# Patient Record
Sex: Female | Born: 1935 | Race: White | Hispanic: No | Marital: Married | Smoking: Never smoker
Health system: Southern US, Community
[De-identification: ages and names within clinical notes are randomized; demographics above are authoritative.]

## PROBLEM LIST (undated history)

## (undated) DIAGNOSIS — S7290XA Unspecified fracture of unspecified femur, initial encounter for closed fracture: Secondary | ICD-10-CM

## (undated) DIAGNOSIS — I679 Cerebrovascular disease, unspecified: Secondary | ICD-10-CM

## (undated) DIAGNOSIS — M8080XA Other osteoporosis with current pathological fracture, unspecified site, initial encounter for fracture: Secondary | ICD-10-CM

## (undated) DIAGNOSIS — R609 Edema, unspecified: Secondary | ICD-10-CM

## (undated) DIAGNOSIS — G319 Degenerative disease of nervous system, unspecified: Secondary | ICD-10-CM

## (undated) DIAGNOSIS — I639 Cerebral infarction, unspecified: Secondary | ICD-10-CM

## (undated) DIAGNOSIS — I4891 Unspecified atrial fibrillation: Secondary | ICD-10-CM

## (undated) DIAGNOSIS — K219 Gastro-esophageal reflux disease without esophagitis: Secondary | ICD-10-CM

## (undated) DIAGNOSIS — D500 Iron deficiency anemia secondary to blood loss (chronic): Secondary | ICD-10-CM

## (undated) DIAGNOSIS — E785 Hyperlipidemia, unspecified: Secondary | ICD-10-CM

## (undated) DIAGNOSIS — E119 Type 2 diabetes mellitus without complications: Secondary | ICD-10-CM

## (undated) DIAGNOSIS — Z794 Long term (current) use of insulin: Secondary | ICD-10-CM

## (undated) DIAGNOSIS — I071 Rheumatic tricuspid insufficiency: Secondary | ICD-10-CM

## (undated) DIAGNOSIS — I5032 Chronic diastolic (congestive) heart failure: Secondary | ICD-10-CM

## (undated) DIAGNOSIS — E041 Nontoxic single thyroid nodule: Secondary | ICD-10-CM

## (undated) DIAGNOSIS — G459 Transient cerebral ischemic attack, unspecified: Secondary | ICD-10-CM

## (undated) DIAGNOSIS — I10 Essential (primary) hypertension: Secondary | ICD-10-CM

## (undated) DIAGNOSIS — M1990 Unspecified osteoarthritis, unspecified site: Secondary | ICD-10-CM

## (undated) DIAGNOSIS — Z923 Personal history of irradiation: Secondary | ICD-10-CM

## (undated) DIAGNOSIS — M4850XA Collapsed vertebra, not elsewhere classified, site unspecified, initial encounter for fracture: Secondary | ICD-10-CM

## (undated) DIAGNOSIS — C3490 Malignant neoplasm of unspecified part of unspecified bronchus or lung: Secondary | ICD-10-CM

## (undated) DIAGNOSIS — I272 Pulmonary hypertension, unspecified: Secondary | ICD-10-CM

## (undated) HISTORY — DX: Iron deficiency anemia secondary to blood loss (chronic): D50.0

## (undated) HISTORY — DX: Transient cerebral ischemic attack, unspecified: G45.9

## (undated) HISTORY — DX: Other osteoporosis with current pathological fracture, unspecified site, initial encounter for fracture: M80.80XA

## (undated) HISTORY — DX: Malignant neoplasm of unspecified part of unspecified bronchus or lung: C34.90

## (undated) HISTORY — DX: Cerebral infarction, unspecified: I63.9

## (undated) HISTORY — DX: Unspecified fracture of unspecified femur, initial encounter for closed fracture: S72.90XA

## (undated) HISTORY — DX: Type 2 diabetes mellitus without complications: E11.9

## (undated) HISTORY — DX: Personal history of irradiation: Z92.3

## (undated) HISTORY — DX: Edema, unspecified: R60.9

## (undated) HISTORY — DX: Unspecified osteoarthritis, unspecified site: M19.90

## (undated) HISTORY — DX: Hyperlipidemia, unspecified: E78.5

## (undated) HISTORY — DX: Nontoxic single thyroid nodule: E04.1

## (undated) HISTORY — DX: Unspecified atrial fibrillation: I48.91

## (undated) HISTORY — DX: Essential (primary) hypertension: I10

## (undated) HISTORY — DX: Gastro-esophageal reflux disease without esophagitis: K21.9

## (undated) HISTORY — DX: Type 2 diabetes mellitus without complications: Z79.4

## (undated) HISTORY — DX: Chronic diastolic (congestive) heart failure: I50.32

---
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
## 1998-01-14 ENCOUNTER — Other Ambulatory Visit: Admission: RE | Admit: 1998-01-14 | Discharge: 1998-01-14 | Payer: Self-pay | Admitting: Gynecology

## 1999-01-23 ENCOUNTER — Other Ambulatory Visit: Admission: RE | Admit: 1999-01-23 | Discharge: 1999-01-23 | Payer: Self-pay | Admitting: Gynecology

## 1999-06-10 ENCOUNTER — Ambulatory Visit (HOSPITAL_COMMUNITY): Admission: RE | Admit: 1999-06-10 | Discharge: 1999-06-10 | Payer: Self-pay | Admitting: *Deleted

## 2000-02-27 ENCOUNTER — Other Ambulatory Visit: Admission: RE | Admit: 2000-02-27 | Discharge: 2000-02-27 | Payer: Self-pay | Admitting: Gynecology

## 2001-02-21 ENCOUNTER — Encounter: Admission: RE | Admit: 2001-02-21 | Discharge: 2001-02-21 | Payer: Self-pay | Admitting: Gynecology

## 2001-02-21 ENCOUNTER — Encounter: Payer: Self-pay | Admitting: Gynecology

## 2001-03-07 ENCOUNTER — Other Ambulatory Visit: Admission: RE | Admit: 2001-03-07 | Discharge: 2001-03-07 | Payer: Self-pay | Admitting: Gynecology

## 2002-05-05 ENCOUNTER — Other Ambulatory Visit: Admission: RE | Admit: 2002-05-05 | Discharge: 2002-05-05 | Payer: Self-pay | Admitting: Gynecology

## 2004-05-26 ENCOUNTER — Other Ambulatory Visit: Admission: RE | Admit: 2004-05-26 | Discharge: 2004-05-26 | Payer: Self-pay | Admitting: Gynecology

## 2004-08-10 DIAGNOSIS — I10 Essential (primary) hypertension: Secondary | ICD-10-CM

## 2004-08-10 HISTORY — DX: Essential (primary) hypertension: I10

## 2004-10-10 ENCOUNTER — Ambulatory Visit: Payer: Self-pay | Admitting: Internal Medicine

## 2004-10-16 ENCOUNTER — Ambulatory Visit: Payer: Self-pay

## 2004-10-30 ENCOUNTER — Ambulatory Visit: Payer: Self-pay

## 2004-11-04 ENCOUNTER — Ambulatory Visit: Payer: Self-pay | Admitting: Internal Medicine

## 2004-11-04 ENCOUNTER — Ambulatory Visit (HOSPITAL_COMMUNITY): Admission: RE | Admit: 2004-11-04 | Discharge: 2004-11-04 | Payer: Self-pay | Admitting: Internal Medicine

## 2004-12-15 ENCOUNTER — Ambulatory Visit: Payer: Self-pay | Admitting: Internal Medicine

## 2005-01-07 ENCOUNTER — Ambulatory Visit: Payer: Self-pay | Admitting: Internal Medicine

## 2005-05-27 ENCOUNTER — Other Ambulatory Visit: Admission: RE | Admit: 2005-05-27 | Discharge: 2005-05-27 | Payer: Self-pay | Admitting: Gynecology

## 2005-08-10 HISTORY — PX: VITRECTOMY: SHX106

## 2005-10-20 ENCOUNTER — Ambulatory Visit: Payer: Self-pay

## 2005-12-17 ENCOUNTER — Ambulatory Visit: Payer: Self-pay

## 2005-12-28 ENCOUNTER — Ambulatory Visit: Payer: Self-pay | Admitting: Internal Medicine

## 2006-05-26 ENCOUNTER — Encounter: Payer: Self-pay | Admitting: Vascular Surgery

## 2006-05-26 ENCOUNTER — Ambulatory Visit (HOSPITAL_COMMUNITY): Admission: RE | Admit: 2006-05-26 | Discharge: 2006-05-26 | Payer: Self-pay | Admitting: Endocrinology

## 2006-07-06 ENCOUNTER — Encounter: Admission: RE | Admit: 2006-07-06 | Discharge: 2006-07-06 | Payer: Self-pay | Admitting: Gynecology

## 2006-08-10 DIAGNOSIS — S7290XA Unspecified fracture of unspecified femur, initial encounter for closed fracture: Secondary | ICD-10-CM

## 2006-08-10 HISTORY — DX: Unspecified fracture of unspecified femur, initial encounter for closed fracture: S72.90XA

## 2006-12-20 ENCOUNTER — Ambulatory Visit: Payer: Self-pay | Admitting: Internal Medicine

## 2007-04-25 ENCOUNTER — Encounter: Admission: RE | Admit: 2007-04-25 | Discharge: 2007-04-25 | Payer: Self-pay | Admitting: Endocrinology

## 2007-05-03 ENCOUNTER — Ambulatory Visit (HOSPITAL_COMMUNITY): Admission: RE | Admit: 2007-05-03 | Discharge: 2007-05-03 | Payer: Self-pay | Admitting: Endocrinology

## 2007-05-03 ENCOUNTER — Encounter (INDEPENDENT_AMBULATORY_CARE_PROVIDER_SITE_OTHER): Payer: Self-pay | Admitting: Endocrinology

## 2007-06-16 ENCOUNTER — Ambulatory Visit: Payer: Self-pay | Admitting: Internal Medicine

## 2007-06-25 ENCOUNTER — Inpatient Hospital Stay (HOSPITAL_COMMUNITY): Admission: EM | Admit: 2007-06-25 | Discharge: 2007-06-30 | Payer: Self-pay | Admitting: Emergency Medicine

## 2007-06-25 HISTORY — PX: OTHER SURGICAL HISTORY: SHX169

## 2007-12-09 ENCOUNTER — Encounter: Admission: RE | Admit: 2007-12-09 | Discharge: 2007-12-09 | Payer: Self-pay | Admitting: Gynecology

## 2008-08-01 ENCOUNTER — Encounter: Admission: RE | Admit: 2008-08-01 | Discharge: 2008-08-01 | Payer: Self-pay | Admitting: Endocrinology

## 2008-12-18 ENCOUNTER — Telehealth: Payer: Self-pay | Admitting: Internal Medicine

## 2009-01-01 ENCOUNTER — Encounter: Admission: RE | Admit: 2009-01-01 | Discharge: 2009-01-01 | Payer: Self-pay | Admitting: Gynecology

## 2009-01-19 DIAGNOSIS — S7290XA Unspecified fracture of unspecified femur, initial encounter for closed fracture: Secondary | ICD-10-CM | POA: Insufficient documentation

## 2009-01-19 DIAGNOSIS — R609 Edema, unspecified: Secondary | ICD-10-CM

## 2009-01-19 DIAGNOSIS — R0609 Other forms of dyspnea: Secondary | ICD-10-CM | POA: Insufficient documentation

## 2009-01-19 DIAGNOSIS — E785 Hyperlipidemia, unspecified: Secondary | ICD-10-CM | POA: Insufficient documentation

## 2009-01-19 DIAGNOSIS — R0989 Other specified symptoms and signs involving the circulatory and respiratory systems: Secondary | ICD-10-CM

## 2009-01-19 DIAGNOSIS — I5032 Chronic diastolic (congestive) heart failure: Secondary | ICD-10-CM

## 2009-01-19 DIAGNOSIS — E119 Type 2 diabetes mellitus without complications: Secondary | ICD-10-CM

## 2009-01-19 DIAGNOSIS — G459 Transient cerebral ischemic attack, unspecified: Secondary | ICD-10-CM | POA: Insufficient documentation

## 2009-01-19 DIAGNOSIS — Z794 Long term (current) use of insulin: Secondary | ICD-10-CM

## 2009-01-19 DIAGNOSIS — I10 Essential (primary) hypertension: Secondary | ICD-10-CM

## 2009-01-19 DIAGNOSIS — D500 Iron deficiency anemia secondary to blood loss (chronic): Secondary | ICD-10-CM | POA: Insufficient documentation

## 2009-01-22 ENCOUNTER — Ambulatory Visit: Payer: Self-pay | Admitting: Internal Medicine

## 2009-01-22 DIAGNOSIS — I4891 Unspecified atrial fibrillation: Secondary | ICD-10-CM | POA: Insufficient documentation

## 2009-03-19 ENCOUNTER — Ambulatory Visit: Payer: Self-pay | Admitting: Internal Medicine

## 2009-06-17 ENCOUNTER — Ambulatory Visit: Payer: Self-pay | Admitting: Internal Medicine

## 2009-06-24 ENCOUNTER — Ambulatory Visit: Payer: Self-pay | Admitting: Cardiology

## 2009-07-03 ENCOUNTER — Ambulatory Visit: Payer: Self-pay | Admitting: Internal Medicine

## 2009-07-03 LAB — CONVERTED CEMR LAB: POC INR: 1.8

## 2009-07-10 ENCOUNTER — Ambulatory Visit: Payer: Self-pay | Admitting: Cardiology

## 2009-07-17 ENCOUNTER — Ambulatory Visit: Payer: Self-pay | Admitting: Cardiology

## 2009-07-22 ENCOUNTER — Encounter: Payer: Self-pay | Admitting: Internal Medicine

## 2009-07-22 LAB — CONVERTED CEMR LAB
HDL: 72 mg/dL
Hgb A1c MFr Bld: 10 %
LDL Cholesterol: 69 mg/dL

## 2009-07-24 ENCOUNTER — Ambulatory Visit: Payer: Self-pay | Admitting: Cardiology

## 2009-07-31 ENCOUNTER — Ambulatory Visit: Payer: Self-pay | Admitting: Internal Medicine

## 2009-07-31 LAB — CONVERTED CEMR LAB: POC INR: 5.8

## 2009-08-08 ENCOUNTER — Ambulatory Visit: Payer: Self-pay | Admitting: Internal Medicine

## 2009-08-08 ENCOUNTER — Encounter: Payer: Self-pay | Admitting: Internal Medicine

## 2009-08-08 ENCOUNTER — Telehealth (INDEPENDENT_AMBULATORY_CARE_PROVIDER_SITE_OTHER): Payer: Self-pay | Admitting: *Deleted

## 2009-08-08 DIAGNOSIS — I498 Other specified cardiac arrhythmias: Secondary | ICD-10-CM | POA: Insufficient documentation

## 2009-08-08 LAB — CONVERTED CEMR LAB
Creatinine, Ser: 0.9 mg/dL (ref 0.4–1.2)
Eosinophils Relative: 0.7 % (ref 0.0–5.0)
GFR calc non Af Amer: 63.86 mL/min (ref >60)
Glucose, Bld: 37 mg/dL — CL (ref 70–99)
HCT: 35.8 % — ABNORMAL LOW (ref 36.0–46.0)
Hemoglobin: 11.9 g/dL — ABNORMAL LOW (ref 12.0–15.0)
Lymphs Abs: 1.6 10*3/uL (ref 0.7–4.0)
MCHC: 33.3 g/dL (ref 30.0–36.0)
MCV: 87.8 fL (ref 78.0–100.0)
Monocytes Relative: 8 % (ref 3.0–12.0)
Neutro Abs: 4.4 10*3/uL (ref 1.4–7.7)
Neutrophils Relative %: 65.5 % (ref 43.0–77.0)
Platelets: 214 10*3/uL (ref 150.0–400.0)
RBC: 4.08 M/uL (ref 3.87–5.11)
RDW: 15.3 % — ABNORMAL HIGH (ref 11.5–14.6)

## 2009-08-12 ENCOUNTER — Ambulatory Visit: Payer: Self-pay | Admitting: Cardiology

## 2009-08-12 ENCOUNTER — Observation Stay (HOSPITAL_COMMUNITY): Admission: RE | Admit: 2009-08-12 | Discharge: 2009-08-13 | Payer: Self-pay | Admitting: Cardiology

## 2009-08-12 ENCOUNTER — Telehealth: Payer: Self-pay | Admitting: Internal Medicine

## 2009-08-23 ENCOUNTER — Ambulatory Visit: Payer: Self-pay | Admitting: Internal Medicine

## 2009-09-06 ENCOUNTER — Ambulatory Visit: Payer: Self-pay | Admitting: Internal Medicine

## 2009-09-12 ENCOUNTER — Ambulatory Visit: Payer: Self-pay | Admitting: Internal Medicine

## 2009-09-27 ENCOUNTER — Ambulatory Visit: Payer: Self-pay | Admitting: Cardiology

## 2009-09-27 LAB — CONVERTED CEMR LAB: POC INR: 5.1

## 2009-10-02 ENCOUNTER — Ambulatory Visit: Payer: Self-pay | Admitting: Internal Medicine

## 2009-10-02 LAB — CONVERTED CEMR LAB: POC INR: 2.1

## 2009-10-09 ENCOUNTER — Ambulatory Visit: Payer: Self-pay | Admitting: Cardiology

## 2009-10-21 ENCOUNTER — Encounter: Payer: Self-pay | Admitting: Internal Medicine

## 2009-10-21 LAB — CONVERTED CEMR LAB: LDL Cholesterol: 68 mg/dL

## 2009-10-28 ENCOUNTER — Ambulatory Visit: Payer: Self-pay | Admitting: Cardiology

## 2009-11-25 ENCOUNTER — Ambulatory Visit: Payer: Self-pay | Admitting: Cardiology

## 2009-11-25 LAB — CONVERTED CEMR LAB: POC INR: 2.3

## 2009-12-02 ENCOUNTER — Telehealth: Payer: Self-pay | Admitting: Internal Medicine

## 2009-12-06 ENCOUNTER — Ambulatory Visit: Payer: Self-pay | Admitting: Internal Medicine

## 2009-12-23 ENCOUNTER — Ambulatory Visit: Payer: Self-pay | Admitting: Internal Medicine

## 2009-12-23 LAB — CONVERTED CEMR LAB: POC INR: 2.9

## 2010-01-17 ENCOUNTER — Ambulatory Visit: Payer: Self-pay | Admitting: Cardiovascular Disease

## 2010-02-13 ENCOUNTER — Ambulatory Visit: Payer: Self-pay | Admitting: Internal Medicine

## 2010-02-13 LAB — CONVERTED CEMR LAB: POC INR: 3.7

## 2010-02-21 ENCOUNTER — Encounter: Payer: Self-pay | Admitting: Internal Medicine

## 2010-02-21 LAB — CONVERTED CEMR LAB
HDL: 65 mg/dL
Hemoglobin: 12.6 g/dL
Hgb A1c MFr Bld: 10.5 %
LDL Cholesterol: 71 mg/dL
LDL Cholesterol: 71 mg/dL
Lymphocytes, automated: 20.7 %
Neutrophils Relative %: 74.6 %
RBC: 4.61 M/uL
WBC: 6.9 10*3/uL

## 2010-03-03 ENCOUNTER — Ambulatory Visit: Payer: Self-pay | Admitting: Cardiology

## 2010-03-03 LAB — CONVERTED CEMR LAB: POC INR: 3.4

## 2010-03-24 ENCOUNTER — Ambulatory Visit: Payer: Self-pay | Admitting: Cardiology

## 2010-03-24 LAB — CONVERTED CEMR LAB: POC INR: 1.9

## 2010-04-15 ENCOUNTER — Ambulatory Visit: Payer: Self-pay | Admitting: Internal Medicine

## 2010-04-15 LAB — CONVERTED CEMR LAB: POC INR: 3

## 2010-05-13 ENCOUNTER — Ambulatory Visit: Payer: Self-pay | Admitting: Cardiology

## 2010-05-26 ENCOUNTER — Encounter: Payer: Self-pay | Admitting: Internal Medicine

## 2010-05-26 LAB — CONVERTED CEMR LAB
ALT: 23 units/L
Albumin: 3.7 g/dL
BUN: 13 mg/dL
Calcium: 8.8 mg/dL
Chloride: 97 meq/L
Cholesterol: 147 mg/dL
Creatinine, Ser: 1 mg/dL
Glucose, Bld: 201 mg/dL
HDL: 62 mg/dL
Hemoglobin: 124 g/dL
Lymphocytes, automated: 25.7 %
MCV: 85.4 fL
Monocytes Relative: 3.4 %
Potassium: 4.4 meq/L
Sodium: 132 meq/L
Total Protein: 7 g/dL
WBC: 85.4 10*3/uL

## 2010-06-03 ENCOUNTER — Ambulatory Visit: Payer: Self-pay | Admitting: Cardiovascular Disease

## 2010-06-03 LAB — CONVERTED CEMR LAB: POC INR: 3.5

## 2010-06-18 ENCOUNTER — Ambulatory Visit: Payer: Self-pay | Admitting: Cardiology

## 2010-07-16 ENCOUNTER — Ambulatory Visit: Payer: Self-pay | Admitting: Internal Medicine

## 2010-07-17 LAB — CONVERTED CEMR LAB: POC INR: 3.1

## 2010-08-01 ENCOUNTER — Encounter: Payer: Self-pay | Admitting: Internal Medicine

## 2010-08-01 LAB — CONVERTED CEMR LAB
ALT: 19 units/L
AST: 30 units/L
Alkaline Phosphatase: 99 units/L
BUN: 24 mg/dL
Calcium: 9.1 mg/dL
Glucose, Bld: 368 mg/dL
HCT: 37.6 %
Lymphocytes, automated: 25.6 %
MCV: 85.6 fL
RDW: 15 %
Total Bilirubin: 0.7 mg/dL
Total Protein: 6.9 g/dL

## 2010-08-06 ENCOUNTER — Ambulatory Visit: Payer: Self-pay | Admitting: Cardiology

## 2010-08-06 LAB — CONVERTED CEMR LAB: POC INR: 2

## 2010-09-02 ENCOUNTER — Encounter: Payer: Self-pay | Admitting: Internal Medicine

## 2010-09-02 LAB — CONVERTED CEMR LAB
POC INR: 2
Prothrombin Time: 21.3 s

## 2010-09-03 ENCOUNTER — Encounter: Payer: Self-pay | Admitting: Internal Medicine

## 2010-09-07 LAB — CONVERTED CEMR LAB: POC INR: 2.9

## 2010-09-11 NOTE — Medication Information (Signed)
Summary: rov/mw  Anticoagulant Therapy  Managed by: Weston Brass, PharmD Referring MD: Dr Sherryl Manges PCP: Maryjane Hurter MD: Shirlee Latch MD, Freida Busman Indication 1: Atrial Fibrillation Lab Used: [facility] [place] Site: [address] INR POC 3.6 INR RANGE 2.0-3.0  Dietary changes: no    Health status changes: yes       Details: Complains of lower back pain.  Bleeding/hemorrhagic complications: no    Recent/future hospitalizations: no    Any changes in medication regimen? no    Recent/future dental: no  Any missed doses?: no       Is patient compliant with meds? yes       Allergies: No Known Drug Allergies  Anticoagulation Management History:      The patient is taking warfarin and comes in today for a routine follow up visit.  Positive risk factors for bleeding include an age of 75 years or older, history of CVA/TIA, and presence of serious comorbidities.  The bleeding index is 'high risk'.  Positive CHADS2 values include History of CHF, History of HTN, Age > 59 years old, History of Diabetes, and Prior Stroke/CVA/TIA.  Anticoagulation responsible provider: Shirlee Latch MD, [place].  INR POC: 3.6.  Cuvette Lot#: [id number].  Exp: 06/2011.    Anticoagulation Management Assessment/Plan:      The patient's current anticoagulation dose is Warfarin sodium 5 mg tabs: Use as directed by Anticoagulation Clinic.  The target INR is 2.0-3.0.  The next INR is due 06/03/2010.  Anticoagulation instructions were given to patient.  Results were reviewed/authorized by Weston Brass, PharmD.  She was notified by Ilean Skill D candidate.         Prior Anticoagulation Instructions: INR 3 Continue taking 5 mg (1 tablet) everyday except on monday take 1.5 tablets. See you in 4 weeks.  Current Anticoagulation Instructions: INR 3.6  Skip today's dose. Start taking 1 tablet everday now. Recheck in 3 weeks.

## 2010-09-11 NOTE — Medication Information (Signed)
Summary: rov/tm  Anticoagulant Therapy  Managed by: Bethena Midget, RN, BSN Referring MD: Dr Sherryl Manges PCP: Marga Melnick Supervising MD: Riley Kill MD, Maisie Fus Indication 1: Atrial Fibrillation Lab Used: [facility] [place] Site: [address] INR POC 2.5 INR RANGE 2.0-3.0  Dietary changes: no    Health status changes: no    Bleeding/hemorrhagic complications: no    Recent/future hospitalizations: no    Any changes in medication regimen? no    Recent/future dental: no  Any missed doses?: no       Is patient compliant with meds? yes       Allergies: No Known Drug Allergies  Anticoagulation Management History:      The patient is taking warfarin and comes in today for a routine follow up visit.  Positive risk factors for bleeding include an age of 15 years or older, history of CVA/TIA, and presence of serious comorbidities.  The bleeding index is 'high risk'.  Positive CHADS2 values include History of CHF, History of HTN, Age > 51 years old, History of Diabetes, and Prior Stroke/CVA/TIA.  Anticoagulation responsible provider: Riley Kill MD, Maisie Fus.  INR POC: 2.5.  Cuvette Lot#: [id number].  Exp: 05//2012.    Anticoagulation Management Assessment/Plan:      The patient's current anticoagulation dose is Warfarin sodium 5 mg tabs: Use as directed by Anticoagulation Clinic.  The target INR is 2.0-3.0.  The next INR is due 10/28/2009.  Anticoagulation instructions were given to patient.  Results were reviewed/authorized by Bethena Midget, RN, BSN.  She was notified by Bethena Midget, RN, BSN.         Prior Anticoagulation Instructions: INR 2.1  Continue 1 pill everyday except 1 1/2  pills on Wednesdays.  Recheck in one week.   Current Anticoagulation Instructions: INR 2.5 Continue 1pill everyday except 1.5 pills on Wednesdays. Recheck in 2  1/2 weeks.

## 2010-09-11 NOTE — Medication Information (Signed)
Summary: ccr  Anticoagulant Therapy  Managed by: Shelby Dubin, PharmD, BCPS, CPP Referring MD: Dr Sherryl Manges PCP: Marga Melnick Supervising MD: Gala Romney MD, Reuel Boom Indication 1: Atrial Fibrillation Lab Used: [facility] [place] Site: [address] INR POC 3.4 INR RANGE 2.0-3.0  Dietary changes: no    Health status changes: yes       Details: Had a cardioversion on January 3rd  Bleeding/hemorrhagic complications: yes       Details: Larey Seat in December and broke ribs  Recent/future hospitalizations: yes    Any changes in medication regimen? no    Recent/future dental: no  Any missed doses?: no       Is patient compliant with meds? yes       Allergies: No Known Drug Allergies  Anticoagulation Management History:      The patient is taking warfarin and comes in today for a routine follow up visit.  Positive risk factors for bleeding include an age of 75 years or older, history of CVA/TIA, and presence of serious comorbidities.  The bleeding index is 'high risk'.  Positive CHADS2 values include History of CHF, History of HTN, Age > 14 years old, History of Diabetes, and Prior Stroke/CVA/TIA.  Anticoagulation responsible provider: Bensimhon MD, Reuel Boom.  INR POC: 3.4.  Cuvette Lot#: [id number].  Exp: 11/2010.    Anticoagulation Management Assessment/Plan:      The patient's current anticoagulation dose is Warfarin sodium 5 mg tabs: Use as directed by Anticoagulation Clinic.  The target INR is 2.0-3.0.  The next INR is due 09/06/2009.  Anticoagulation instructions were given to patient.  Results were reviewed/authorized by Shelby Dubin, PharmD, BCPS, CPP.  She was notified by Ysidro Evert, Pharm D Candidate.         Prior Anticoagulation Instructions: INR 2.9  CONTINUE TAKING 1 TABLET EVERYDAY EXCEPT TAKE 1.5 TABLETS ON MONDAYS, WEDNESDAYS, AND FRIDAYS.  RECHECK IN 2 WEEKS.  Current Anticoagulation Instructions: INR: 3.4 Decrease today's dose to 5mg  ( 1 tablet)  then resume to same dosage of 5mg  tablet daily except 7.5mg  on Mondays, Wednesdays and Fridays Recheck in 2 weeks

## 2010-09-11 NOTE — Medication Information (Signed)
Summary: rov/sel  Anticoagulant Therapy  Managed by: Bethena Midget, RN, BSN Referring MD: Dr Sherryl Manges PCP: Maryjane Hurter MD: Shirlee Latch MD, [place] Indication 1: Atrial Fibrillation Lab Used: [facility] [place] Site: [address] INR POC 3.5 INR RANGE 2.0-3.0  Dietary changes: no    Health status changes: no    Bleeding/hemorrhagic complications: no    Recent/future hospitalizations: no    Any changes in medication regimen? no    Recent/future dental: no  Any missed doses?: no       Is patient compliant with meds? yes       Allergies: No Known Drug Allergies  Anticoagulation Management History:      The patient is taking warfarin and comes in today for a routine follow up visit.  Positive risk factors for bleeding include an age of 75 years or older, history of CVA/TIA, and presence of serious comorbidities.  The bleeding index is 'high risk'.  Positive CHADS2 values include History of CHF, History of HTN, Age > 33 years old, History of Diabetes, and Prior Stroke/CVA/TIA.  Anticoagulation responsible provider: Shirlee Latch MD, [place].  INR POC: 3.5.  Cuvette Lot#: [id number].  Exp: 06/2011.    Anticoagulation Management Assessment/Plan:      The patient's current anticoagulation dose is Warfarin sodium 5 mg tabs: Use as directed by Anticoagulation Clinic.  The target INR is 2.0-3.0.  The next INR is due 06/17/2010.  Anticoagulation instructions were given to patient.  Results were reviewed/authorized by Bethena Midget, RN, BSN.  She was notified by Bethena Midget, RN, BSN.         Prior Anticoagulation Instructions: INR 3.6  Skip today's dose. Start taking 1 tablet everday now. Recheck in 3 weeks.   Current Anticoagulation Instructions: INR 3.5 Skip today's dose then resume 5mg s everyday.  Eat more leafy green vegetables. Recheck in 2 weeks.

## 2010-09-11 NOTE — Progress Notes (Signed)
Summary: appt   Phone Note Call from Patient Call back at Home Phone [phone]   Caller: Patient Reason for Call: Talk to Nurse Summary of Call: wants to know if she needs to come to her appt Initial call taken by: Migdalia Dk,  December 02, 2009 9:46 AM  Follow-up for Phone Call        pt asking if she needs to keep appt with Dr Graciela Husbands on Friday since she has decided not to have "procedure" done--I urged her to keep the appointment with Dr Graciela Husbands on Friday so she could review her options with him-

## 2010-09-11 NOTE — Medication Information (Signed)
Summary: rov/tm  Anticoagulant Therapy  Managed by: Cloyde Reams, RN, BSN Referring MD: Dr Sherryl Manges PCP: Marga Melnick Supervising MD: Shirlee Latch MD, Artemus Romanoff Indication 1: Atrial Fibrillation Lab Used: [facility] [place] Site: [address] INR POC 2.3 INR RANGE 2.0-3.0  Dietary changes: no    Health status changes: no    Bleeding/hemorrhagic complications: yes       Details: 1 episode of BRB assoc with BM, none since, ? hemmroidal r/t.  Recent/future hospitalizations: no    Any changes in medication regimen? no    Recent/future dental: no  Any missed doses?: no       Is patient compliant with meds? yes       Allergies (verified): No Known Drug Allergies  Anticoagulation Management History:      The patient is taking warfarin and comes in today for a routine follow up visit.  Positive risk factors for bleeding include an age of 75 years or older, history of CVA/TIA, and presence of serious comorbidities.  The bleeding index is 'high risk'.  Positive CHADS2 values include History of CHF, History of HTN, Age > 20 years old, History of Diabetes, and Prior Stroke/CVA/TIA.  Anticoagulation responsible provider: Shirlee Latch MD, Samule Life.  INR POC: 2.3.  Cuvette Lot#: [id number].  Exp: 12/2010.    Anticoagulation Management Assessment/Plan:      The patient's current anticoagulation dose is Warfarin sodium 5 mg tabs: Use as directed by Anticoagulation Clinic.  The target INR is 2.0-3.0.  The next INR is due 11/25/2009.  Anticoagulation instructions were given to patient.  Results were reviewed/authorized by Cloyde Reams, RN, BSN.  She was notified by Cloyde Reams RN.         Prior Anticoagulation Instructions: INR 2.5 Continue 1pill everyday except 1.5 pills on Wednesdays. Recheck in 2  1/2 weeks.   Current Anticoagulation Instructions: INR 2.3  Continue on same dosage 1 tablet daily except 1.5 tablets on Wednesdays.   Recheck in 3-4 weeks.

## 2010-09-11 NOTE — Medication Information (Signed)
Summary: rov/tm  Anticoagulant Therapy  Managed by: Reina Fuse, PharmD Referring MD: Dr Sherryl Manges PCP: Maryjane Hurter MD: Cassell Clement Indication 1: Atrial Fibrillation Lab Used: [facility] [place] Site: [address] INR POC 3.1 INR RANGE 2.0-3.0  Dietary changes: no    Health status changes: no    Bleeding/hemorrhagic complications: no    Recent/future hospitalizations: no    Any changes in medication regimen? no    Recent/future dental: no  Any missed doses?: no       Is patient compliant with meds? yes      Comments: Discussed with pt that blood has been this (INR supratherapeutic) at the last 3 checks, but pt insists on coming in 1 month.   Allergies: No Known Drug Allergies  Anticoagulation Management History:      The patient is taking warfarin and comes in today for a routine follow up visit.  Positive risk factors for bleeding include an age of 62 years or older, history of CVA/TIA, and presence of serious comorbidities.  The bleeding index is 'high risk'.  Positive CHADS2 values include History of CHF, History of HTN, Age > 52 years old, History of Diabetes, and Prior Stroke/CVA/TIA.  Anticoagulation responsible provider: Cassell Clement.  INR POC: 3.1.  Cuvette Lot#: [id number].  Exp: 06/2011.    Anticoagulation Management Assessment/Plan:      The patient's current anticoagulation dose is Warfarin sodium 5 mg tabs: Use as directed by Anticoagulation Clinic.  The target INR is 2.0-3.0.  The next INR is due 07/16/2010.  Anticoagulation instructions were given to patient.  Results were reviewed/authorized by Reina Fuse, PharmD.  She was notified by Reina Fuse PharmD.         Prior Anticoagulation Instructions: INR 3.5 Skip today's dose then resume 5mg s everyday.  Eat more leafy green vegetables. Recheck in 2 weeks.   Current Anticoagulation Instructions: INR 3.1   Today, Wednesday, November 9th, take Coumadin 0.5 tab (2.5  mg). Then, continue taking Coumadin 1 tab (5 mg) every day. Return to clinic in 4 weeks per patient's request.

## 2010-09-11 NOTE — Medication Information (Signed)
Summary: rov/eac  Anticoagulant Therapy  Managed by: Bethena Midget, RN, BSN Referring MD: Dr Sherryl Manges PCP: Marga Melnick Supervising MD: Myrtis Ser MD, Tinnie Gens Indication 1: Atrial Fibrillation Lab Used: [facility] [place] Site: [address] INR POC 2.1 INR RANGE 2.0-3.0  Dietary changes: no    Health status changes: no    Bleeding/hemorrhagic complications: no    Recent/future hospitalizations: no    Any changes in medication regimen? no    Recent/future dental: no  Any missed doses?: no       Is patient compliant with meds? yes       Allergies: No Known Drug Allergies  Anticoagulation Management History:      The patient is taking warfarin and comes in today for a routine follow up visit.  Positive risk factors for bleeding include an age of 80 years or older, history of CVA/TIA, and presence of serious comorbidities.  The bleeding index is 'high risk'.  Positive CHADS2 values include History of CHF, History of HTN, Age > 69 years old, History of Diabetes, and Prior Stroke/CVA/TIA.  Anticoagulation responsible provider: Myrtis Ser MD, Tinnie Gens.  INR POC: 2.1.  Cuvette Lot#: [id number].  Exp: 11/2010.    Anticoagulation Management Assessment/Plan:      The patient's current anticoagulation dose is Warfarin sodium 5 mg tabs: Use as directed by Anticoagulation Clinic.  The target INR is 2.0-3.0.  The next INR is due 10/09/2009.  Anticoagulation instructions were given to patient.  Results were reviewed/authorized by Bethena Midget, RN, BSN.  She was notified by Bethena Midget, RN, BSN.         Prior Anticoagulation Instructions: INR 5.1  Do NOT take coumadin today or saturday.  Then start NEW dosing schedule of 1.5 tablets on Wednesday adn Friday, and take 1 tablet all other days.  Return to clinic in 5 days.  Current Anticoagulation Instructions: INR 2.1  Continue 1 pill everyday except 1 1/2  pills on Wednesdays.  Recheck in one week.

## 2010-09-11 NOTE — Medication Information (Signed)
Summary: rov/ewj  Anticoagulant Therapy  Managed by: Reina Fuse, PharmD Referring MD: Dr Sherryl Manges PCP: Maryjane Hurter MD: Myrtis Ser MD, Tinnie Gens Indication 1: Atrial Fibrillation Lab Used: [facility] [place] Site: [address] INR POC 1.9 INR RANGE 2.0-3.0  Dietary changes: no    Health status changes: yes       Details: Not feeling very well today.   Bleeding/hemorrhagic complications: no    Recent/future hospitalizations: no    Any changes in medication regimen? no    Recent/future dental: no  Any missed doses?: no       Is patient compliant with meds? yes       Current Medications (verified): 1)  Crestor 5 Mg Tabs (Rosuvastatin Calcium) .... Take One Tablet Once Daily 2)  Vitamin D 2000 Unit Tabs (Cholecalciferol) .... Once Daily 3)  Calcium Carbonate-Vitamin D 600-400 Mg-Unit  Tabs (Calcium Carbonate-Vitamin D) .... Take 1 Tablet By Mouth Three Times A Day 4)  Actonel 150 Mg Tabs (Risedronate Sodium) .... Once A Month 5)  Humalog Mix 75/25 75-25 % Susp (Insulin Lispro Prot & Lispro) .... Use Two Times A Day Subq 6)  Warfarin Sodium 5 Mg Tabs (Warfarin Sodium) .... Use As Directed By Anticoagulation Clinic 7)  Atenolol 25 Mg Tabs (Atenolol) .... Once Daily  Allergies (verified): No Known Drug Allergies  Anticoagulation Management History:      The patient is taking warfarin and comes in today for a routine follow up visit.  Positive risk factors for bleeding include an age of 75 years or older, history of CVA/TIA, and presence of serious comorbidities.  The bleeding index is 'high risk'.  Positive CHADS2 values include History of CHF, History of HTN, Age > 65 years old, History of Diabetes, and Prior Stroke/CVA/TIA.  Anticoagulation responsible provider: Myrtis Ser MD, Tinnie Gens.  INR POC: 1.9.  Cuvette Lot#: [id number].  Exp: 05/2011.    Anticoagulation Management Assessment/Plan:      The patient's current anticoagulation dose is Warfarin sodium 5 mg tabs:  Use as directed by Anticoagulation Clinic.  The target INR is 2.0-3.0.  The next INR is due 04/15/2010.  Anticoagulation instructions were given to patient.  Results were reviewed/authorized by Reina Fuse, PharmD.  She was notified by Reina Fuse PharmD.         Prior Anticoagulation Instructions: INR 3.4  Take 1/2 tablet today, then start taking 1 tablet daily.  Recheck in 3 weeks.   Current Anticoagulation Instructions: INR 1.9  Take Coumadin 1 tab (5 mg) all days except Coumadin 1.5 tab (7.5 mg) on Mondays. Return to clinic in 3 weeks.

## 2010-09-11 NOTE — Medication Information (Signed)
Summary: rov/tm  Anticoagulant Therapy  Managed by: Cloyde Reams, RN, BSN Referring MD: Dr Sherryl Manges PCP: Maryjane Hurter MD: Riley Kill MD, Maisie Fus Indication 1: Atrial Fibrillation Lab Used: [facility] [place] Site: [address] INR POC 3.4 INR RANGE 2.0-3.0  Dietary changes: no    Health status changes: no    Bleeding/hemorrhagic complications: no    Recent/future hospitalizations: no    Any changes in medication regimen? yes       Details: Change Actonel from once a month to once weekly  Recent/future dental: no  Any missed doses?: no       Is patient compliant with meds? yes       Allergies: No Known Drug Allergies  Anticoagulation Management History:      The patient is taking warfarin and comes in today for a routine follow up visit.  Positive risk factors for bleeding include an age of 75 years or older, history of CVA/TIA, and presence of serious comorbidities.  The bleeding index is 'high risk'.  Positive CHADS2 values include History of CHF, History of HTN, Age > 75 years old, History of Diabetes, and Prior Stroke/CVA/TIA.  Anticoagulation responsible provider: Riley Kill MD, Maisie Fus.  INR POC: 3.4.  Cuvette Lot#: [id number].  Exp: 05/2011.    Anticoagulation Management Assessment/Plan:      The patient's current anticoagulation dose is Warfarin sodium 5 mg tabs: Use as directed by Anticoagulation Clinic.  The target INR is 2.0-3.0.  The next INR is due 03/24/2010.  Anticoagulation instructions were given to patient.  Results were reviewed/authorized by Cloyde Reams, RN, BSN.  She was notified by Cloyde Reams RN.         Prior Anticoagulation Instructions: INR 3.7 Skip today, then resume 5mg s everyday except 7.5mg s on Wednesdays.Recheck in 2 weeks.   Current Anticoagulation Instructions: INR 3.4  Take 1/2 tablet today, then start taking 1 tablet daily.  Recheck in 3 weeks.

## 2010-09-11 NOTE — Medication Information (Signed)
Summary: rov/ewj  Anticoagulant Therapy  Managed by: Wendy Hopkins, PharmD Referring MD: Dr Sherryl Manges PCP: Wendy Hurter MD: Wendy Chance MD, Antonique Langford Indication 1: Atrial Fibrillation Lab Used: [facility] [place] Site: [address] INR POC 2.3 INR RANGE 2.0-3.0  Dietary changes: no    Health status changes: no    Bleeding/hemorrhagic complications: no    Recent/future hospitalizations: no    Any changes in medication regimen? no    Recent/future dental: no  Any missed doses?: no       Is patient compliant with meds? yes       Allergies: No Known Drug Allergies  Anticoagulation Management History:      The patient is taking warfarin and comes in today for a routine follow up visit.  Positive risk factors for bleeding include an age of 75 years or older, history of CVA/TIA, and presence of serious comorbidities.  The bleeding index is 'high risk'.  Positive CHADS2 values include History of CHF, History of HTN, Age > 75 years old, History of Diabetes, and Prior Stroke/CVA/TIA.  Anticoagulation responsible provider: Juanda Chance MD, Smitty Cords.  INR POC: 2.3.  Cuvette Lot#: [id number].  Exp: 12/2010.    Anticoagulation Management Assessment/Plan:      The patient's current anticoagulation dose is Warfarin sodium 5 mg tabs: Use as directed by Anticoagulation Clinic.  The target INR is 2.0-3.0.  The next INR is due 12/23/2009.  Anticoagulation instructions were given to patient.  Results were reviewed/authorized by Wendy Hopkins, PharmD.  She was notified by Wendy Hopkins PharmD.         Prior Anticoagulation Instructions: INR 2.3  Continue on same dosage 1 tablet daily except 1.5 tablets on Wednesdays.   Recheck in 3-4 weeks.    Current Anticoagulation Instructions: INR 2.3  Continue same dose of 1 tablet every day except 1 1/2 tablet on Wednesday

## 2010-09-11 NOTE — Medication Information (Signed)
Summary: rov/tm  Anticoagulant Therapy  Managed by: Eda Keys, PharmD Referring MD: Dr Sherryl Manges PCP: Marga Melnick Supervising MD: Jens Som MD, Arlys John Indication 1: Atrial Fibrillation Lab Used: [facility] [place] Site: [address] INR POC 5.1 INR RANGE 2.0-3.0  Dietary changes: no    Health status changes: no    Bleeding/hemorrhagic complications: no    Recent/future hospitalizations: no    Any changes in medication regimen? no    Recent/future dental: no  Any missed doses?: no       Is patient compliant with meds? yes      Comments: Pt reports no causes of INR being 5.1.  No dietary changes, no extra doses, no APAP or EtOH.  Will see pt back in 5 days.   Allergies: No Known Drug Allergies  Anticoagulation Management History:      The patient is taking warfarin and comes in today for a routine follow up visit.  Positive risk factors for bleeding include an age of 29 years or older, history of CVA/TIA, and presence of serious comorbidities.  The bleeding index is 'high risk'.  Positive CHADS2 values include History of CHF, History of HTN, Age > 89 years old, History of Diabetes, and Prior Stroke/CVA/TIA.  Anticoagulation responsible provider: Jens Som MD, Arlys John.  INR POC: 5.1.  Cuvette Lot#: [id number].  Exp: 11/2010.    Anticoagulation Management Assessment/Plan:      The patient's current anticoagulation dose is Warfarin sodium 5 mg tabs: Use as directed by Anticoagulation Clinic.  The target INR is 2.0-3.0.  The next INR is due 10/02/2009.  Anticoagulation instructions were given to patient.  Results were reviewed/authorized by Eda Keys, PharmD.  She was notified by Eda Keys.         Prior Anticoagulation Instructions: INR 2.0 Today take 2 pills then resume 1 pill everyday except 1 1/2 pills on Mondays, Wednesdays and Fridays. Recheck in 3 weeks.   Current Anticoagulation Instructions: INR 5.1  Do NOT take coumadin today or  saturday.  Then start NEW dosing schedule of 1.5 tablets on Wednesday adn Friday, and take 1 tablet all other days.  Return to clinic in 5 days.

## 2010-09-11 NOTE — Medication Information (Signed)
Summary: rov/sp  Anticoagulant Therapy  Managed by: Weston Brass, PharmD Referring MD: Dr Sherryl Manges PCP: Maryjane Hurter MD: Tenny Craw MD, Gunnar Fusi Indication 1: Atrial Fibrillation Lab Used: [facility] [place] Site: [address] INR POC 2.9 INR RANGE 2.0-3.0  Dietary changes: no    Health status changes: yes       Details: Pt complains of headaches,not feeling too well  Bleeding/hemorrhagic complications: no    Recent/future hospitalizations: no    Any changes in medication regimen? no    Recent/future dental: no  Any missed doses?: no       Is patient compliant with meds? yes       Allergies: No Known Drug Allergies  Anticoagulation Management History:      The patient is taking warfarin and comes in today for a routine follow up visit.  Positive risk factors for bleeding include an age of 75 years or older, history of CVA/TIA, and presence of serious comorbidities.  The bleeding index is 'high risk'.  Positive CHADS2 values include History of CHF, History of HTN, Age > 74 years old, History of Diabetes, and Prior Stroke/CVA/TIA.  Anticoagulation responsible provider: Tenny Craw MD, Gunnar Fusi.  INR POC: 2.9.  Cuvette Lot#: [id number].  Exp: 03/2011.    Anticoagulation Management Assessment/Plan:      The patient's current anticoagulation dose is Warfarin sodium 5 mg tabs: Use as directed by Anticoagulation Clinic.  The target INR is 2.0-3.0.  The next INR is due 01/20/2010.  Anticoagulation instructions were given to patient.  Results were reviewed/authorized by Weston Brass, PharmD.  She was notified by [facility].         Prior Anticoagulation Instructions: INR 2.3  Continue same dose of 1 tablet every day except 1 1/2 tablet on Wednesday   Current Anticoagulation Instructions: INR-2.9 Resume normal shedule. Take 1.5 tablets on a Wednesday and take 1 tablet on all other days. Return in 4 weeks.

## 2010-09-11 NOTE — Medication Information (Signed)
Summary: rov/sp  Anticoagulant Therapy  Managed by: Bethena Midget, RN, BSN Referring MD: Dr Sherryl Manges PCP: Maryjane Hurter MD: Clifton James MD, Cristal Deer Indication 1: Atrial Fibrillation Lab Used: [facility] [place] Site: [address] INR POC 2.0 INR RANGE 2.0-3.0  Dietary changes: no    Health status changes: no    Bleeding/hemorrhagic complications: no    Recent/future hospitalizations: no    Any changes in medication regimen? no    Recent/future dental: no  Any missed doses?: no       Is patient compliant with meds? yes       Allergies: No Known Drug Allergies  Anticoagulation Management History:      The patient is taking warfarin and comes in today for a routine follow up visit.  Positive risk factors for bleeding include an age of 68 years or older, history of CVA/TIA, and presence of serious comorbidities.  The bleeding index is 'high risk'.  Positive CHADS2 values include History of CHF, History of HTN, Age > 61 years old, History of Diabetes, and Prior Stroke/CVA/TIA.  Anticoagulation responsible provider: Clifton James MD, Cristal Deer.  INR POC: 2.0.  Cuvette Lot#: [id number].  Exp: 03/2011.    Anticoagulation Management Assessment/Plan:      The patient's current anticoagulation dose is Warfarin sodium 5 mg tabs: Use as directed by Anticoagulation Clinic.  The target INR is 2.0-3.0.  The next INR is due 02/14/2010.  Anticoagulation instructions were given to patient.  Results were reviewed/authorized by Bethena Midget, RN, BSN.  She was notified by Bethena Midget, RN, BSN.         Prior Anticoagulation Instructions: INR-2.9 Resume normal shedule. Take 1.5 tablets on a Wednesday and take 1 tablet on all other days. Return in 4 weeks.  Current Anticoagulation Instructions: INR 2.0 Continue 1 pill everyday except 1 1/2 pills on Wednesdays. Recheck in 4 weeks.

## 2010-09-11 NOTE — Medication Information (Signed)
Summary: rov/tm  Anticoagulant Therapy  Managed by: Bethena Midget, RN, BSN Referring MD: Dr Sherryl Manges PCP: Marga Melnick Supervising MD: Gala Romney MD, Reuel Boom Indication 1: Atrial Fibrillation Lab Used: [facility] [place] Site: [address] INR POC 2.0 INR RANGE 2.0-3.0  Dietary changes: no    Health status changes: no    Bleeding/hemorrhagic complications: no    Recent/future hospitalizations: no    Any changes in medication regimen? no    Recent/future dental: no  Any missed doses?: yes     Details: missed Wednesdays dose  Is patient compliant with meds? yes       Allergies: No Known Drug Allergies  Anticoagulation Management History:      The patient is taking warfarin and comes in today for a routine follow up visit.  Positive risk factors for bleeding include an age of 19 years or older, history of CVA/TIA, and presence of serious comorbidities.  The bleeding index is 'high risk'.  Positive CHADS2 values include History of CHF, History of HTN, Age > 59 years old, History of Diabetes, and Prior Stroke/CVA/TIA.  Anticoagulation responsible provider: Bensimhon MD, Reuel Boom.  INR POC: 2.0.  Cuvette Lot#: [id number].  Exp: 11/2010.    Anticoagulation Management Assessment/Plan:      The patient's current anticoagulation dose is Warfarin sodium 5 mg tabs: Use as directed by Anticoagulation Clinic.  The target INR is 2.0-3.0.  The next INR is due 09/27/2009.  Anticoagulation instructions were given to patient.  Results were reviewed/authorized by Bethena Midget, RN, BSN.  She was notified by Bethena Midget, RN, BSN.         Prior Anticoagulation Instructions: INR: 3.4 Decrease today's dose to 5mg  ( 1 tablet) then resume to same dosage of 5mg  tablet daily except 7.5mg  on Mondays, Wednesdays and Fridays Recheck in 2 weeks  Current Anticoagulation Instructions: INR 2.0 Today take 2 pills then resume 1 pill everyday except 1 1/2 pills on Mondays, Wednesdays and  Fridays. Recheck in 3 weeks.

## 2010-09-11 NOTE — Medication Information (Signed)
Summary: Coumadin Clinic  Anticoagulant Therapy  Managed by: Weston Brass, PharmD Referring MD: Dr Sherryl Manges PCP: Maryjane Hurter MD: Graciela Husbands MD, Viviann Spare Indication 1: Atrial Fibrillation Lab Used: [facility] [place] Site: [address] PT 21.3 INR POC 2.0 INR RANGE 2.0-3.0  Dietary changes: no    Health status changes: no    Bleeding/hemorrhagic complications: no    Recent/future hospitalizations: no    Any changes in medication regimen? no    Recent/future dental: no  Any missed doses?: no       Is patient compliant with meds? yes       Allergies: No Known Drug Allergies  Anticoagulation Management History:      Her anticoagulation is being managed by telephone today.  Positive risk factors for bleeding include an age of 75 years or older, history of CVA/TIA, and presence of serious comorbidities.  The bleeding index is 'high risk'.  Positive CHADS2 values include History of CHF, History of HTN, Age > 75 years old, History of Diabetes, and Prior Stroke/CVA/TIA.  Prothrombin time is 21.3.  Anticoagulation responsible provider: Graciela Husbands MD, Viviann Spare.  INR POC: 2.0.  Exp: 08/2011.    Anticoagulation Management Assessment/Plan:      The patient's current anticoagulation dose is Warfarin sodium 5 mg tabs: Use as directed by Anticoagulation Clinic.  The target INR is 2.0-3.0.  The next INR is due 10/03/2010.  Anticoagulation instructions were given to patient.  Results were reviewed/authorized by Weston Brass, PharmD.  She was notified by Weston Brass PharmD.         Prior Anticoagulation Instructions: INR 2.0 Continue 5mg s daily except 2.5mg s on Thursdays. Recheck in 4 weeks. Rx given due to pt being out of town in [place].  Current Anticoagulation Instructions: INR 2.0  LMOM to call back. Weston Brass PharmD  September 03, 2010 9:27 AM   Spoke with pt.  Continue same dose of 1  tablet every day except 1/2 tablet on Thursday.  Recheck INR in 4 weeks.  Appt made for pt to  return to clinic to have INR checked.

## 2010-09-11 NOTE — Assessment & Plan Note (Signed)
Summary: 2 month rov/sl   Primary Provider:  Dagoberto Ligas   CC:  2 month rov/  SOB with afib and pt feeling like heart rate is slower than before or atleast feels more SOB.  History of Present Illness: She is seen in followup for atrial fibrillation with a CHADS score of 5-6 with a prior TIA/stroke    Sis taking her Coumadin and if finally got her well regulated.  She underwent cardioversion in January; this failed to hold sinus rhythm. When we saw her at her last visit , having stopped her atenolol, she remained quite bradycardic. We discussed at that time the role of assessment for chronotropic incompetence using treadmill testing for consideration of pacing. She was reluctant to do it at that time.  She continues to complain of exercise intolerance manifested by shortness of breath associated with walking or pushing a vacuum. There is been no edema chest pain or lightheadedness.   Current Medications (verified): 1)  Crestor 5 Mg Tabs (Rosuvastatin Calcium) .... Take One Tablet Once Daily 2)  Vitamin D 2000 Unit Tabs (Cholecalciferol) .... Once Daily 3)  Calcium Carbonate-Vitamin D 600-400 Mg-Unit  Tabs (Calcium Carbonate-Vitamin D) .... Take 1 Tablet By Mouth Three Times A Day 4)  Actonel 150 Mg Tabs (Risedronate Sodium) .... Once A Month 5)  Humalog Mix 75/25 75-25 % Susp (Insulin Lispro Prot & Lispro) .... Use Two Times A Day Subq 6)  Warfarin Sodium 5 Mg Tabs (Warfarin Sodium) .... Use As Directed By Anticoagulation Clinic 7)  Atenolol 25 Mg Tabs (Atenolol) .... Once Daily  Allergies (verified): No Known Drug Allergies  Vital Signs:  Patient profile:   77 year old female Height:      60 inches Weight:      127 pounds BMI:     24.89 Pulse rate:   53 / minute Pulse rhythm:   irregular BP sitting:   132 / 68  (left arm) Cuff size:   regular  Vitals Entered By: Judithe Modest CMA (December 06, 2009 11:13 AM)  Physical Exam  General:  The patient was alert and oriented in no acute  distress.Neck veins were flat, carotids were brisk. Lungs were clear. Heart sounds were irregular without murmurs or gallops. Abdomen was soft with active bowel sounds. There is no clubbing cyanosis or edema.    Impression & Recommendations:  Problem # 1:  ATRIAL FIBRILLATION (ICD-427.31) Her age her fibrillation is permanent. It remains slow. The patient remains reluctant to pursue pacing and/or stress testing to look for chronotropic incompetence. She is willing to continue on her Coumadin Her updated medication list for this problem includes:    Warfarin Sodium 5 Mg Tabs (Warfarin sodium) ..... Use as directed by anticoagulation clinic    Atenolol 25 Mg Tabs (Atenolol) ..... Once daily  Orders: EKG w/ Interpretation (93000)  Problem # 2:  BRADYCARDIA (ICD-427.89) as above;  will discontinue her atenolol Her updated medication list for this problem includes:    Warfarin Sodium 5 Mg Tabs (Warfarin sodium) ..... Use as directed by anticoagulation clinic    Atenolol 25 Mg Tabs (Atenolol) ..... Once daily  Problem # 3:  CHRONIC DIASTOLIC HEART FAILURE (ICD-428.32) The patient has dyspnea on exertion.  we will check a 2de echo Her updated medication list for this problem includes:    Warfarin Sodium 5 Mg Tabs (Warfarin sodium) ..... Use as directed by anticoagulation clinic    Atenolol 25 Mg Tabs (Atenolol) ..... Once daily  Patient Instructions: 1)  Your  physician wants you to follow-up in:12 months with Dr Graciela Husbands.   You will receive a reminder letter in the mail two months in advance. If you don't receive a letter, please call our office to schedule the follow-up appointment.

## 2010-09-11 NOTE — Medication Information (Signed)
Summary: rov/sp  Anticoagulant Therapy  Managed by: Bethena Midget, RN, BSN Referring MD: Dr Sherryl Manges PCP: Maryjane Hurter MD: Riley Kill MD, Maisie Fus Indication 1: Atrial Fibrillation Lab Used: [facility] [place] Site: [address] INR POC 2.0 INR RANGE 2.0-3.0  Dietary changes: no    Health status changes: no    Bleeding/hemorrhagic complications: no    Recent/future hospitalizations: no    Any changes in medication regimen? no    Recent/future dental: no  Any missed doses?: yes     Details: Possible missed on Christmas night  Is patient compliant with meds? yes       Allergies: No Known Drug Allergies  Anticoagulation Management History:      The patient is taking warfarin and comes in today for a routine follow up visit.  Positive risk factors for bleeding include an age of 75 years or older, history of CVA/TIA, and presence of serious comorbidities.  The bleeding index is 'high risk'.  Positive CHADS2 values include History of CHF, History of HTN, Age > 75 years old, History of Diabetes, and Prior Stroke/CVA/TIA.  Anticoagulation responsible provider: Riley Kill MD, Maisie Fus.  INR POC: 2.0.  Cuvette Lot#: [id number].  Exp: 08/2011.    Anticoagulation Management Assessment/Plan:      The patient's current anticoagulation dose is Warfarin sodium 5 mg tabs: Use as directed by Anticoagulation Clinic.  The target INR is 2.0-3.0.  The next INR is due 09/03/2010.  Anticoagulation instructions were given to patient.  Results were reviewed/authorized by Bethena Midget, RN, BSN.  She was notified by Bethena Midget, RN, BSN.        Coagulation management information includes: [phone] Pt son's cell # to reach her in [place].  Pt cell # [phone].  Prior Anticoagulation Instructions: INR 3.1  Decrease dose to 1 tablet every day except 1/2 tablet on Thursday.  Recheck INR in 3 weeks.   Current Anticoagulation Instructions: INR 2.0 Continue 5mg s daily except 2.5mg s  on Thursdays. Recheck in 4 weeks. Rx given due to pt being out of town in [place].

## 2010-09-11 NOTE — Medication Information (Signed)
Summary: rov/sl  Anticoagulant Therapy  Managed by: Weston Brass, PharmD Referring MD: Dr Sherryl Manges PCP: Maryjane Hurter MD: Graciela Husbands MD, Viviann Spare Indication 1: Atrial Fibrillation Lab Used: [facility] [place] Site: [address] INR POC 3.1 INR RANGE 2.0-3.0  Dietary changes: no    Health status changes: no    Bleeding/hemorrhagic complications: no    Recent/future hospitalizations: no    Any changes in medication regimen? no    Recent/future dental: no  Any missed doses?: no       Is patient compliant with meds? yes       Allergies: No Known Drug Allergies  Anticoagulation Management History:      The patient is taking warfarin and comes in today for a routine follow up visit.  Positive risk factors for bleeding include an age of 75 years or older, history of CVA/TIA, and presence of serious comorbidities.  The bleeding index is 'high risk'.  Positive CHADS2 values include History of CHF, History of HTN, Age > 64 years old, History of Diabetes, and Prior Stroke/CVA/TIA.  Anticoagulation responsible provider: Graciela Husbands MD, Viviann Spare.  INR POC: 3.1.  Cuvette Lot#: [id number].  Exp: 05/2011.    Anticoagulation Management Assessment/Plan:      The patient's current anticoagulation dose is Warfarin sodium 5 mg tabs: Use as directed by Anticoagulation Clinic.  The target INR is 2.0-3.0.  The next INR is due 08/06/2010.  Anticoagulation instructions were given to patient.  Results were reviewed/authorized by Weston Brass, PharmD.  She was notified by Weston Brass PharmD.         Prior Anticoagulation Instructions: INR 3.1   Today, Wednesday, November 9th, take Coumadin 0.5 tab (2.5 mg). Then, continue taking Coumadin 1 tab (5 mg) every day. Return to clinic in 4 weeks per patient's request.   Current Anticoagulation Instructions: INR 3.1  Decrease dose to 1 tablet every day except 1/2 tablet on Thursday.  Recheck INR in 3 weeks.

## 2010-09-11 NOTE — Medication Information (Signed)
Summary: rov/sl  Anticoagulant Therapy  Managed by: Reina Fuse, PharmD Referring MD: Dr Sherryl Manges PCP: Maryjane Hurter MD: Gala Romney MD, Reuel Boom Indication 1: Atrial Fibrillation Lab Used: [facility] [place] Site: [address] INR POC 3 INR RANGE 2.0-3.0  Dietary changes: no    Health status changes: no    Bleeding/hemorrhagic complications: no    Recent/future hospitalizations: no    Any changes in medication regimen? no    Recent/future dental: no  Any missed doses?: no       Is patient compliant with meds? yes       Allergies: No Known Drug Allergies  Anticoagulation Management History:      Positive risk factors for bleeding include an age of 75 years or older, history of CVA/TIA, and presence of serious comorbidities.  The bleeding index is 'high risk'.  Positive CHADS2 values include History of CHF, History of HTN, Age > 67 years old, History of Diabetes, and Prior Stroke/CVA/TIA.  Anticoagulation responsible provider: Bensimhon MD, Reuel Boom.  INR POC: 3.  Cuvette Lot#: [id number].  Exp: 05/2011.    Anticoagulation Management Assessment/Plan:      The patient's current anticoagulation dose is Warfarin sodium 5 mg tabs: Use as directed by Anticoagulation Clinic.  The target INR is 2.0-3.0.  The next INR is due 05/13/2010.  Anticoagulation instructions were given to patient.  Results were reviewed/authorized by Reina Fuse, PharmD.  She was notified by Cloyde Reams RN.         Prior Anticoagulation Instructions: INR 1.9  Take Coumadin 1 tab (5 mg) all days except Coumadin 1.5 tab (7.5 mg) on Mondays. Return to clinic in 3 weeks.    Current Anticoagulation Instructions: INR 3 Continue taking 5 mg (1 tablet) everyday except on monday take 1.5 tablets. See you in 4 weeks.

## 2010-09-11 NOTE — Medication Information (Signed)
Summary: rov/tm  Anticoagulant Therapy  Managed by: Bethena Midget, RN, BSN Referring MD: Dr Sherryl Manges PCP: Maryjane Hurter MD: Ladona Ridgel MD, Sharlot Gowda Indication 1: Atrial Fibrillation Lab Used: [facility] [place] Site: [address] INR POC 3.7 INR RANGE 2.0-3.0  Dietary changes: no    Health status changes: no    Bleeding/hemorrhagic complications: no    Recent/future hospitalizations: no    Any changes in medication regimen? no    Recent/future dental: no  Any missed doses?: no       Is patient compliant with meds? yes       Allergies: No Known Drug Allergies  Anticoagulation Management History:      The patient is taking warfarin and comes in today for a routine follow up visit.  Positive risk factors for bleeding include an age of 75 years or older, history of CVA/TIA, and presence of serious comorbidities.  The bleeding index is 'high risk'.  Positive CHADS2 values include History of CHF, History of HTN, Age > 75 years old, History of Diabetes, and Prior Stroke/CVA/TIA.  Anticoagulation responsible provider: Ladona Ridgel MD, Sharlot Gowda.  INR POC: 3.7.  Cuvette Lot#: [id number].  Exp: 04/2011.    Anticoagulation Management Assessment/Plan:      The patient's current anticoagulation dose is Warfarin sodium 5 mg tabs: Use as directed by Anticoagulation Clinic.  The target INR is 2.0-3.0.  The next INR is due 02/27/2010.  Anticoagulation instructions were given to patient.  Results were reviewed/authorized by Bethena Midget, RN, BSN.  She was notified by Bethena Midget, RN, BSN.         Prior Anticoagulation Instructions: INR 2.0 Continue 1 pill everyday except 1 1/2 pills on Wednesdays. Recheck in 4 weeks.   Current Anticoagulation Instructions: INR 3.7 Skip today, then resume 5mg s everyday except 7.5mg s on Wednesdays.Recheck in 2 weeks.

## 2010-09-11 NOTE — Assessment & Plan Note (Signed)
Summary: eph/kfw   Primary Provider:  Marga Melnick  CC:  eph/pt had cardioversion. She states she is still SOB.  Quit taking lasix because it was making her "sick"  She states that since she quit taking it she has not been sick anymore.  History of Present Illness: She is seen in followup for atrial fibrillation with a CHADS score of 5-6 with a prior TIA/stroke    Because of some confusion, she has finally gotten on her Coumadin and has a series of therapeutic INRs. She underwent cardioversion by Dr. Jens Som earlier in January. This was successful with restoration of sinus rhythm. She has however not noted any significant improvement in her exercise tolerance. We noted also at her last visit patient quite bradycardic and stopped her atenolol.  She notes that she is short of breath not with sitting around and minimal activity but with activity as little as pushing a vacuum and certainly walking up an incline she is quite short of breath. There is no peripheral edema and no chest pain   Current Medications (verified): 1)  Crestor 5 Mg Tabs (Rosuvastatin Calcium) .... Take One Tablet Once Daily 2)  Vitamin D 2000 Unit Tabs (Cholecalciferol) .... Once Daily 3)  Calcium Carbonate-Vitamin D 600-400 Mg-Unit  Tabs (Calcium Carbonate-Vitamin D) .... Take 1 Tablet By Mouth Three Times A Day 4)  Actonel 150 Mg Tabs (Risedronate Sodium) .... Once A Month 5)  Humalog Mix 75/25 75-25 % Susp (Insulin Lispro Prot & Lispro) .... Use Two Times A Day Subq 6)  Warfarin Sodium 5 Mg Tabs (Warfarin Sodium) .... Use As Directed By Anticoagulation Clinic  Allergies (verified): No Known Drug Allergies  Past History:  Past Medical History: Last updated: 01/19/2009 ANEMIA, SECONDARY TO BLOOD LOSS (ICD-280.0) IRREGULAR HEART RATE (ICD-427.9) FRACTURE, FEMUR, RIGHT (ICD-821.00) DM (ICD-250.00) TRANSIENT ISCHEMIC ATTACK (ICD-435.9) DYSLIPIDEMIA (ICD-272.4) HYPERTENSION (ICD-401.9) PERIPHERAL EDEMA  (ICD-782.3) CHRONIC DIASTOLIC HEART FAILURE (ICD-428.32) DYSPNEA ON EXERTION (ICD-786.09) SUPRAVENTRICULAR TACHYCARDIA (ICD-427.89)  Past Surgical History: Last updated: 01/19/2009 Right femur intramedullary open reduction internal fixation. 06/25/2007   Social History: Last updated: 01/19/2009 Married   Vital Signs:  Patient profile:   77 year old female Height:      60 inches Weight:      127 pounds BMI:     24.89 Pulse rate:   54 / minute Pulse rhythm:   irregular BP sitting:   140 / 62  (left arm) Cuff size:   regular  Vitals Entered By: Judithe Modest CMA (September 12, 2009 10:59 AM)  Physical Exam  General:  The patient was alert and oriented in no acute distress.Neck veins were flat, carotids were brisk. Lungs were clear. Heart sounds were irregular without murmurs or gallops. Abdomen was soft with active bowel sounds. There is no clubbing cyanosis or edema. affect is flat and sad   EKG  Procedure date:  09/12/2009  Findings:      afibrillation at about 55 beats per minute Intervals-/0.08/0.40 Otherwise normal  Impression & Recommendations:  Problem # 1:  ATRIAL FIBRILLATION (ICD-427.31) The patient has persistent atrial fibrillation with a slow ventricular response. We discussed first the importance of long term anticoagulation. She was under the impression that after one month following cardioversion it could be discontinued. I have clarified that for her hopefully that long term is most appropriate especially with her CHADS VASC score of 4.  Her exercise intolerance may be related to the atrial fibrillation. One option that we discussed was the restoration of sinus  rhythm with the use of antiarrhythmic drug therapy forits maintanence.  Prior to that we would need to undertake imaging of her heart to exclude coronary disease as well as to assess left atrial size.. We discussed the potential for arrhythmic risks of antiarrhythmic drugs she would like not to do  this recently.  We also discussed the implications of her bradycardia Her updated medication list for this problem includes:    Warfarin Sodium 5 Mg Tabs (Warfarin sodium) ..... Use as directed by anticoagulation clinic  Orders: EKG w/ Interpretation (93000)  Problem # 2:  BRADYCARDIA (ICD-427.89) The patient has a slow heart rate in atrial fibrillation. She may well be chronotropic leg incompetence. We discussed the evaluation of this using treadmill testing to identify heart rate response to activity and to help sort out whether she might benefit from backup bradycardia rate responsive pacing. She would like also not to do this presently.  We have decided to revisit these things in 2 or 3 months. Her updated medication list for this problem includes:    Warfarin Sodium 5 Mg Tabs (Warfarin sodium) ..... Use as directed by anticoagulation clinic  Problem # 3:  HYPERTENSION (ICD-401.9) blood pressure is adequately controlled at this time The following medications were removed from the medication list:    Furosemide 20 Mg Tabs (Furosemide) ..... Once daily  Patient Instructions: 1)  Your physician recommends that you schedule a follow-up appointment in: 2-3 months

## 2010-09-11 NOTE — Progress Notes (Signed)
Summary: req call back  Phone Note Call from Patient Call back at Home Phone [phone]   Reason for Call: Talk to Nurse Summary of Call: request call back from nurse Initial call taken by: Migdalia Dk,  August 12, 2009 8:40 AM  Follow-up for Phone Call        Returned call and pt wanted to go over instructions again. She was advised to hold her Lasix, she had already taken it. She was concerned about not taking her insulin. Advised not to take it before her cardioversion. It is very hard to tell if she is understanding her instructions. She also could not remember that Dr. Jens Som was doing her cardioversion instead of Dr. Graciela Husbands. This was also part of the instructions given to her on Friday.  Follow-up by: Duncan Dull, RN, BSN,  August 12, 2009 9:28 AM

## 2010-09-27 DIAGNOSIS — Z7901 Long term (current) use of anticoagulants: Secondary | ICD-10-CM

## 2010-09-27 DIAGNOSIS — I4891 Unspecified atrial fibrillation: Secondary | ICD-10-CM

## 2010-09-27 DIAGNOSIS — G459 Transient cerebral ischemic attack, unspecified: Secondary | ICD-10-CM

## 2010-10-03 ENCOUNTER — Encounter: Payer: Self-pay | Admitting: Internal Medicine

## 2010-10-03 ENCOUNTER — Encounter (INDEPENDENT_AMBULATORY_CARE_PROVIDER_SITE_OTHER): Payer: Medicare Other

## 2010-10-03 DIAGNOSIS — I4891 Unspecified atrial fibrillation: Secondary | ICD-10-CM

## 2010-10-03 DIAGNOSIS — Z7901 Long term (current) use of anticoagulants: Secondary | ICD-10-CM

## 2010-10-03 LAB — CONVERTED CEMR LAB: POC INR: 2.8

## 2010-10-07 NOTE — Medication Information (Signed)
Summary: rov/sp  Anticoagulant Therapy  Managed by: Weston Brass, PharmD Referring MD: Dr Sherryl Manges PCP: Maryjane Hurter MD: Tenny Craw MD, Gunnar Fusi Indication 1: Atrial Fibrillation Lab Used: [facility] [place] Site: [address] INR POC 2.8 INR RANGE 2.0-3.0  Dietary changes: no    Health status changes: no    Bleeding/hemorrhagic complications: no    Recent/future hospitalizations: no    Any changes in medication regimen? no    Recent/future dental: no  Any missed doses?: no       Is patient compliant with meds? yes       Allergies: No Known Drug Allergies  Anticoagulation Management History:      The patient is taking warfarin and comes in today for a routine follow up visit.  Positive risk factors for bleeding include an age of 75 years or older, history of CVA/TIA, and presence of serious comorbidities.  The bleeding index is 'high risk'.  Positive CHADS2 values include History of CHF, History of HTN, Age > 44 years old, History of Diabetes, and Prior Stroke/CVA/TIA.  Anticoagulation responsible provider: Tenny Craw MD, Gunnar Fusi.  INR POC: 2.8.  Cuvette Lot#: [id number].  Exp: 09/2011.    Anticoagulation Management Assessment/Plan:      The patient's current anticoagulation dose is Warfarin sodium 5 mg tabs: Use as directed by Anticoagulation Clinic.  The target INR is 2.0-3.0.  The next INR is due 10/31/2010.  Anticoagulation instructions were given to patient.  Results were reviewed/authorized by Weston Brass, PharmD.  She was notified by Margot Chimes PharmD Candidate.         Prior Anticoagulation Instructions: INR 2.0  LMOM to call back. Weston Brass PharmD  September 03, 2010 9:27 AM   Spoke with pt.  Continue same dose of 1  tablet every day except 1/2 tablet on Thursday.  Recheck INR in 4 weeks.  Appt made for pt to return to clinic to have INR checked.   Current Anticoagulation Instructions: INR 2.8  Continue to take 1 tablet daily except for Thursdays  when you only take 1/2 tablet.  Recheck INR in 4 weeks.

## 2010-10-23 ENCOUNTER — Telehealth (INDEPENDENT_AMBULATORY_CARE_PROVIDER_SITE_OTHER): Payer: Self-pay | Admitting: *Deleted

## 2010-10-23 ENCOUNTER — Encounter: Payer: Self-pay | Admitting: Internal Medicine

## 2010-10-23 DIAGNOSIS — K219 Gastro-esophageal reflux disease without esophagitis: Secondary | ICD-10-CM | POA: Insufficient documentation

## 2010-10-26 LAB — GLUCOSE, CAPILLARY
Glucose-Capillary: 165 mg/dL — ABNORMAL HIGH (ref 70–99)
Glucose-Capillary: 217 mg/dL — ABNORMAL HIGH (ref 70–99)
Glucose-Capillary: 254 mg/dL — ABNORMAL HIGH (ref 70–99)
Glucose-Capillary: 288 mg/dL — ABNORMAL HIGH (ref 70–99)
Glucose-Capillary: 354 mg/dL — ABNORMAL HIGH (ref 70–99)
Glucose-Capillary: 448 mg/dL — ABNORMAL HIGH (ref 70–99)

## 2010-10-26 LAB — BASIC METABOLIC PANEL
BUN: 14 mg/dL (ref 6–23)
Creatinine, Ser: 0.95 mg/dL (ref 0.4–1.2)
GFR calc non Af Amer: 56 mL/min — ABNORMAL LOW (ref >60)

## 2010-10-26 LAB — CBC
HCT: 34.9 % — ABNORMAL LOW (ref 36.0–46.0)
Hemoglobin: 11.8 g/dL — ABNORMAL LOW (ref 12.0–15.0)
Platelets: 214 10*3/uL (ref 150–400)
RDW: 16.3 % — ABNORMAL HIGH (ref 11.5–15.5)
WBC: 6.8 10*3/uL (ref 4.0–10.5)

## 2010-10-26 LAB — CARDIAC PANEL(CRET KIN+CKTOT+MB+TROPI): Total CK: 91 U/L (ref 7–177)

## 2010-10-26 LAB — PROTIME-INR: INR: 3.92 — ABNORMAL HIGH (ref 0.00–1.49)

## 2010-10-26 LAB — TSH: TSH: 2.163 u[IU]/mL (ref 0.350–4.500)

## 2010-10-26 LAB — MAGNESIUM: Magnesium: 2.1 mg/dL (ref 1.5–2.5)

## 2010-10-27 ENCOUNTER — Encounter: Payer: Self-pay | Admitting: Internal Medicine

## 2010-10-28 ENCOUNTER — Ambulatory Visit (INDEPENDENT_AMBULATORY_CARE_PROVIDER_SITE_OTHER): Payer: Medicare Other | Admitting: Internal Medicine

## 2010-10-28 ENCOUNTER — Other Ambulatory Visit: Payer: Self-pay | Admitting: *Deleted

## 2010-10-28 ENCOUNTER — Encounter: Payer: Self-pay | Admitting: Internal Medicine

## 2010-10-28 ENCOUNTER — Ambulatory Visit: Payer: Medicare Other

## 2010-10-28 DIAGNOSIS — I499 Cardiac arrhythmia, unspecified: Secondary | ICD-10-CM | POA: Insufficient documentation

## 2010-10-28 DIAGNOSIS — E119 Type 2 diabetes mellitus without complications: Secondary | ICD-10-CM

## 2010-10-28 DIAGNOSIS — Z794 Long term (current) use of insulin: Secondary | ICD-10-CM

## 2010-10-28 DIAGNOSIS — M8080XA Other osteoporosis with current pathological fracture, unspecified site, initial encounter for fracture: Secondary | ICD-10-CM

## 2010-10-28 DIAGNOSIS — E785 Hyperlipidemia, unspecified: Secondary | ICD-10-CM

## 2010-10-28 DIAGNOSIS — I10 Essential (primary) hypertension: Secondary | ICD-10-CM

## 2010-10-28 DIAGNOSIS — G459 Transient cerebral ischemic attack, unspecified: Secondary | ICD-10-CM | POA: Insufficient documentation

## 2010-10-28 DIAGNOSIS — M8440XA Pathological fracture, unspecified site, initial encounter for fracture: Secondary | ICD-10-CM

## 2010-10-28 DIAGNOSIS — R609 Edema, unspecified: Secondary | ICD-10-CM | POA: Insufficient documentation

## 2010-10-28 MED ORDER — ROSUVASTATIN CALCIUM 5 MG PO TABS: 5.0000 mg | ORAL_TABLET | Freq: Every day | ORAL | Status: DC

## 2010-10-28 NOTE — Assessment & Plan Note (Addendum)
labs reviewed from prior provider - no changes today - cont same

## 2010-10-28 NOTE — Progress Notes (Signed)
Subjective:    Patient ID: Wendy Hopkins, female    DOB: Feb 28, 1926, 77 y.o.   MRN: [id number]  HPI New pt to me and our practice, here to est care Transfer from dr. Dagoberto Ligas  Reviewed chronic med issues: HTN - reports compliance with medications, no adv SE reports; no recent med changes; no CP, edema or HA.  Dyslipidemia - no change in med dose, reports compliance with med as prescribed. No adv GI or mskel SE noted.  Afib - on chronic anticoag, follows with [facility]= hx TIA assoc with same - no SOB or palp  DM2 - insulin dep but ssi only use - follows with endo for same - cbg range 150s - denies hyper pr hypoglycemia symptoms  Past Medical History  Diagnosis Date  . Arthritis   . Diabetes mellitus   . Chicken pox   . Hyperlipidemia   . 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 Cardiac dysrhythmia, unspecified     A fib - chronic anticoag  . Closed fracture of unspecified part of femur   . Unspecified transient cerebral ischemia   . Edema   . Chronic diastolic heart failure   . Other specified cardiac dysrhythmias   . Osteoporosis with fracture     compression fx lower T spine and t4   Past Surgical History  Procedure Date  . Right femur 06/25/07    Intramedullary open reduction internal fixation  . Vitrectomy 2007    right eye with cataract repair - dr. Gwendalyn Ege    reports that she has never smoked. She does not have any smokeless tobacco history on file. She reports that she does not drink alcohol or use illicit drugs. family history includes Diabetes in her other; Hypertension in her other; and Lung cancer in her other. No Known Allergies  Review of Systems Patient denies chest pain, edema or dyspnea on exertion. No headache, rash or abdominal pain. No specific complaints in a complete review of systems (except as listed above).     Objective:    Physical Exam  Nursing note and vitals reviewed. Constitutional: She is oriented to person, place,  and time. She appears well-developed and well-nourished. No distress.  HENT:  Head: Normocephalic and atraumatic.  Right Ear: External ear normal.  Left Ear: External ear normal.  Nose: Nose normal.  Mouth/Throat: Oropharynx is clear and moist. No oropharyngeal exudate.  Eyes: EOM are normal. Pupils are equal, round, and reactive to light. Right eye exhibits no discharge. Left eye exhibits no discharge. No scleral icterus.  Neck: Normal range of motion. Neck supple. No JVD present. No tracheal deviation present. No thyromegaly present.  Cardiovascular: Normal rate, normal heart sounds and intact distal pulses.  Exam reveals no friction rub.   No murmur heard.      irreg irreg  Pulmonary/Chest: Effort normal and breath sounds normal. No respiratory distress. She has no wheezes. She has no rales. She exhibits no tenderness.  Abdominal: Soft. Bowel sounds are normal. She exhibits no distension and no mass. There is no tenderness. There is no rebound and no guarding.  Genitourinary:       Defer to gyn  Musculoskeletal: Normal range of motion.       No gross deformities  Lymphadenopathy:    She has no cervical adenopathy.  Neurological: She is alert and oriented to person, place, and time. She has normal reflexes. No cranial nerve deficit.  Skin: Skin is warm and dry. No rash  noted. She is not diaphoretic. No erythema.  Psychiatric: She has a normal mood and affect. Her behavior is normal. Judgment and thought content normal.   dtr and spouse are at side       Assessment & Plan:  See problem list today  Time spent with pt/family today 45 minutes, greater than 50% time spent counseling patient on diabetes, depression and medication review. Also review of prior records

## 2010-10-28 NOTE — Patient Instructions (Addendum)
good to meet you today Medications and prior history reviewed - no changes recommended today Will check labs as discussed - your results will be called to you after review (48-72 hours from completion) Copy of results will be sent to new endocrinologist (kerr) Refill on crestor done today followup in 3-4 months to continue review, call sooner if problems

## 2010-10-28 NOTE — Assessment & Plan Note (Signed)
Vitals and labs reviewed - at goal - continue same

## 2010-10-28 NOTE — Progress Notes (Signed)
  Phone Note Other Incoming   Request: Send information Summary of Call: Records recieved from [employer]. [employer] Psychologist, clinical). 141 pages forwarded to Dr. Felicity Coyer.

## 2010-10-28 NOTE — Assessment & Plan Note (Signed)
Follows with endo for same but changing providers -no visit planned in next 6 weeks overdue for labs  Check a1c now, adjust prn

## 2010-10-29 LAB — BASIC METABOLIC PANEL
CO2: 31 mEq/L (ref 19–32)
Chloride: 97 mEq/L (ref 96–112)
Glucose, Bld: 349 mg/dL — ABNORMAL HIGH (ref 70–99)
Potassium: 5.4 mEq/L — ABNORMAL HIGH (ref 3.5–5.1)
Sodium: 132 mEq/L — ABNORMAL LOW (ref 135–145)

## 2010-10-30 LAB — VITAMIN D 25 HYDROXY (VIT D DEFICIENCY, FRACTURES): Vit D, 25-Hydroxy: 64 ng/mL (ref 30–89)

## 2010-10-31 ENCOUNTER — Ambulatory Visit (INDEPENDENT_AMBULATORY_CARE_PROVIDER_SITE_OTHER): Payer: Medicare Other | Admitting: *Deleted

## 2010-10-31 ENCOUNTER — Encounter: Payer: Self-pay | Admitting: Internal Medicine

## 2010-10-31 DIAGNOSIS — I4891 Unspecified atrial fibrillation: Secondary | ICD-10-CM

## 2010-10-31 DIAGNOSIS — G459 Transient cerebral ischemic attack, unspecified: Secondary | ICD-10-CM

## 2010-10-31 DIAGNOSIS — Z7901 Long term (current) use of anticoagulants: Secondary | ICD-10-CM

## 2010-10-31 NOTE — Patient Instructions (Signed)
Take 1.5 tablets today, then resume same dosage 1 tablet daily except 1/2 tablet on Thursdays.  Recheck in 4 weeks.

## 2010-11-28 ENCOUNTER — Ambulatory Visit (INDEPENDENT_AMBULATORY_CARE_PROVIDER_SITE_OTHER): Payer: Medicare Other | Admitting: *Deleted

## 2010-11-28 DIAGNOSIS — Z7901 Long term (current) use of anticoagulants: Secondary | ICD-10-CM

## 2010-11-28 DIAGNOSIS — G459 Transient cerebral ischemic attack, unspecified: Secondary | ICD-10-CM

## 2010-11-28 DIAGNOSIS — I4891 Unspecified atrial fibrillation: Secondary | ICD-10-CM

## 2010-11-28 LAB — POCT INR: INR: 3.3

## 2010-12-10 ENCOUNTER — Other Ambulatory Visit: Payer: Self-pay | Admitting: Internal Medicine

## 2010-12-10 DIAGNOSIS — E041 Nontoxic single thyroid nodule: Secondary | ICD-10-CM

## 2010-12-16 ENCOUNTER — Ambulatory Visit
Admission: RE | Admit: 2010-12-16 | Discharge: 2010-12-16 | Disposition: A | Discharge status: Home / Self Care | Payer: Medicare Other | Source: Ambulatory Visit | Attending: Internal Medicine | Admitting: Internal Medicine

## 2010-12-16 DIAGNOSIS — E041 Nontoxic single thyroid nodule: Secondary | ICD-10-CM

## 2010-12-19 ENCOUNTER — Ambulatory Visit (INDEPENDENT_AMBULATORY_CARE_PROVIDER_SITE_OTHER): Payer: Medicare Other | Admitting: *Deleted

## 2010-12-19 DIAGNOSIS — G459 Transient cerebral ischemic attack, unspecified: Secondary | ICD-10-CM

## 2010-12-19 DIAGNOSIS — I4891 Unspecified atrial fibrillation: Secondary | ICD-10-CM

## 2010-12-19 DIAGNOSIS — Z7901 Long term (current) use of anticoagulants: Secondary | ICD-10-CM

## 2010-12-19 LAB — POCT INR: INR: 2.1

## 2010-12-23 NOTE — Discharge Summary (Signed)
Wendy Hopkins, Wendy Hopkins              ACCOUNT NO.:  [id number]   MEDICAL RECORD NO.:  [id number]          PATIENT TYPE:  INP   LOCATION:  1613                         FACILITY:  [facility]   PHYSICIAN:  Lubertha Basque. Dalldorf, M.D.DATE OF BIRTH:  Oct 19, 1925   DATE OF ADMISSION:  06/25/2007  DATE OF DISCHARGE:  06/30/2007                               DISCHARGE SUMMARY   ADMITTING DIAGNOSES:  1. Right supracondylar femur fracture.  2. Insulin dependent diabetes mellitus.  3. History of cerebrovascular accident.  4. History of irregular heart beat.  5. Hypertension.   DISCHARGE DIAGNOSES:  1. Right supracondylar femur fracture.  2. Insulin dependent diabetes mellitus.  3. History of cerebrovascular accident.  4. History of irregular heart beat.  5. Hypertension.  6. Blood loss anemia.   BRIEF HISTORY:  [name] is a 77 year old female, who fell while  cleaning her house when her vacuum cleaner cord wrapped around her right  leg and she felt something snap and pop and she fell, and was unable to  stand to bear weight. She was transported to [facility] via ambulance.  At the time of presentation, x-rays revealed a supracondylar displaced  femur fracture right. Discussed treatment options with her and her  family that being ORIF of this above-mentioned fracture.   PERTINENT LABORATORY AND X-RAY FINDINGS:  X-rays of her knee and leg at  that time showed a comminuted fracture of the right distal femoral  diaphysis. Chest x-ray cardiomegaly without cardiopulmonary disease. Her  laboratory studies, her hemoglobin dropped to 7.8 and two units of  packed RBCs were transfused as necessary. WBCs 7.6. Platelets 143.  Sodium 131, potassium 4.4, BUN 9, creatinine 0.86. Blood sugars varied  106 to 253.   COURSE IN THE HOSPITAL:  The patient was admitted from the emergency  room, taken to the operating room and underwent the above-mentioned  procedure.  Postoperatively, she was on a PCA reduced  protocol Dilaudid  pump. Glycemic control order set for nonpregnant adults was used to  manage her blood sugar. IV of lactated ringers, antibiotics and IV Ancef  1 gram q.8 hours x 3 doses. She was on Lovenox for DVT prophylaxis while  in the hospital and then once she became active, this was discontinued.  She was also kept on Crestor, Aggrenox, insulin dosage and Atenolol per  home meds.  DITEDs incentive spirometry, touch down weight bearing with  the help of physical therapy.  First day postop, vital signs were  stable, she was comfortable. She had a Foley catheter in and it was  later discontinued. Hemoglobin was stable. Potassium was stable. On the  second day, dressing was changed, the wound was noted to be benign.  Somewhat of a drop in hemoglobin, but she did not want to undergo a  transfusion.  We discussed plans with the family about placement either  skilled nursing facility or if they were able to take care of her at  home. On the third postop, her hemoglobin was 7.8 and we discussed  transfusion with her. Due to her blood loss anemia, two units of packed  RBCs  were transfused.  She was oriented. Her lungs were clear. Abdomen  was soft throughout her hospital stay, no sign of infection. Good  neurovascular status to her toes. She was working slow at physical  therapy, was having trouble getting up and ambulating, putting weight on  her arms and not on her leg. For this reason it was felt that she would  be best served going to a skilled nursing facility. Condition on  discharge is improved. Follow up, she can change the dressing on her  wound on a daily basis. She is to be touch down weight bearing with  crutches or a walker, low sodium heart healthy diet. Follow up with Dr.  Jerl Santos in about ten days from her surgery. She will remain on the  following medications.  1. Colace 100 mg one p.o. b.i.d.  2. Trinsicon one pill per day.  3. Maalox 30 ml p.o. p.r.n. indigestion.   4. Robaxin 500 one p.o. q.8 p.r.n. spasm.  5. Senokot tabs one p.o. p.r.n. constipation.  6. Tylenol one or two q.4-6 p.r.n. temperature elevations greater than      100.5.  7. Vicodin 5/500 one or two q.4-6 p.r.n.  pain.   She will also remain on her normal medicines, which are Aggrenox one a  day, Atenolol one a day, Crestor one a day and then insulin 75/25 dose  per home dosage and the dosages of her home medicines will be given by  her or family members. She will also be on a sliding scale of insulin  for coverage as needed and as follows. Blood sugar 101 to 150 one unit  of NovoLog, 151 to 200 two units of NovoLog, 202 to 250 three units of  NovoLog, 250 to 300 five units of NovoLog, 301 to 350 seven units of  NovoLog, greater than 350 nine units of NovoLog.      Lindwood Qua, P.A.      Lubertha Basque Jerl Santos, M.D.  Electronically Signed    MC/MEDQ  D:  06/28/2007  T:  06/29/2007  Job:  [id number]

## 2010-12-23 NOTE — Letter (Signed)
Dec 20, 2006    Alfonse Alpers. Dagoberto Ligas, M.D.  [address], [address]  [place], [place] [address]   RE:  KATORA, FINI  MRN:  [id number]  /  DOB:  October 01, 1925   Dear Dr. Dagoberto Ligas,   Ms. Vo comes in today. She has a history of SVT, she is status post  slow pathway modification. When I saw her last year, she was complaining  of exertional shortness of breath. We undertook a Myoview scan  demonstrating normal left ventricular function and no evidence of  ischemia. She does have diabetes and hypertension. We do not have her  cholesterol status. She is mildly obese. She denies intercurrent  problems with chest pain. She continues to however have complaints of  shortness of breath with walking up hills particularly.   Her diet is notable for being not felt complete. Her volume status is  okay.   PHYSICAL EXAMINATION:  VITAL SIGNS:  On examination today, her blood  pressure is much improved at 106/73.  LUNGS:  Clear.  NECK:  Neck veins were flat.  HEART:  Sounds were regular with an S4 and an early systolic murmur.  EXTREMITIES:  1-2+ edema bilaterally.   Electrocardiogram  dated today demonstrated a sinus rhythm at 55 with  intervals of 0.16/0.08/0.42. The axis was mildly leftward at -11.   IMPRESSION:  1. Supraventricular tachycardia status post slow pathway modification.  2. Dyspnea on exertion likely chronic diastolic heart failure.  3. Peripheral edema consistent with #2.  4. History of hypertension now improved.  5. Dyslipidemia on Crestor.   Ms. Coppock was asking about her need for cholesterol medication. I gave  her a primary prevention tracking card and asked her to have you fill in  her target for her LDL and her HDL, but I told her that the Crestor was  very reasonable at this point.   I encouraged her to think about a diuretic which she is not interested  in doing at present. She will try to decrease her salt intake and maybe  that will help ameliorate her edema and  her dyspnea on exertion. I  suspect that will be insufficient and a low-dose diuretic may be of  benefit.   She is to see you in August. I have asked to have her come back and see  me in November. If there is anything I can do in the interim, please do  not hesitate to contact me.    Sincerely,      Duke Salvia, MD, [facility]  Electronically Signed    SCK/MedQ  DD: 12/20/2006  DT: 12/20/2006  Job #: [id number]

## 2010-12-23 NOTE — Letter (Signed)
June 16, 2007    Alfonse Alpers. Dagoberto Ligas, M.D.  [address]  [place], [place] [address]   RE:  DOLA, LUNSFORD  MRN:  [id number]  /  DOB:  1925-08-15   Dear Virl Diamond,   Mrs. Pavone comes in today having had an intercurrent TIA.  She has no  known atrial fibrillation.  Her thromboembolic risk factors, if she were  to have atrial fibrillation, would include diabetes, hypertension, her  age and her prior CVA.   She is also having problems with peripheral edema which is relatively  chronic.   Her medications are notable for the absence of an ACE inhibitor and use  of atenolol for her blood pressure.   Her blood pressure today was, however, still elevated at 158/48, her  pulse was 61.  Her lungs were clear.  Her heart sounds were regular with  an early systolic murmur.  The extremities had 1 to 2+ peripheral edema.  Her neurological exam was notable for bilateral symmetric motor strength  and no paresthesias.   Electrocardiogram dated today demonstrated sinus rhythm at 60 with  intervals at 0.16/0.08/0.41 and the axis was leftward at minus 29.  There were isolated PVC's.   IMPRESSION:  1. Intercurrent transient ischemic attack.  2. Question mechanism, rule out atrial fibrillation.  3. Status post __________ modification.  4. Diabetes.  5. Hypertension.   Chuck, Mrs. Pigman has no symptomatic tachyarrhythmia's.  I am concerned  that atrial fibrillation might have been underlying her TIA and we now  have atrial fibrillation detectors which are designed to identify  irregular RR intervals.  In the event that she had atrial fibrillation  on such a monitor I think Aggrenox would probably not be as useful for  her as Coumadin and, given the high thromboembolic risk potential in the  event that this were atrial fibrillation and the epidemiological  likelihood that she has atrial fibrillation, I think it is worth [place]  trying to exclude.   The other question is whether she  would be better off on an ACE  inhibitor, given her diabetes and her hypertension, as opposed to  atenolol, especially since she has had no recurrent tachy-palpitations.   I look forward to talking with you about this.    Sincerely,      Duke Salvia, MD, [facility]  Electronically Signed    SCK/MedQ  DD: 06/16/2007  DT: 06/16/2007  Job #: [id number]

## 2010-12-23 NOTE — Op Note (Signed)
Wendy Hopkins, Wendy Hopkins              ACCOUNT NO.:  [id number]   MEDICAL RECORD NO.:  [id number]          PATIENT TYPE:  INP   LOCATION:  1613                         FACILITY:  [facility]   PHYSICIAN:  Lubertha Basque. Dalldorf, M.D.DATE OF BIRTH:  [birth year]   DATE OF PROCEDURE:  06/25/2007  DATE OF DISCHARGE:                               OPERATIVE REPORT   PREOPERATIVE DIAGNOSIS:  Right supracondylar femur fracture.   POSTOPERATIVE DIAGNOSIS:  Right supracondylar femur fracture.   PROCEDURE:  Right femur intramedullary open reduction internal fixation.   ANESTHESIA:  General.   ATTENDING SURGEON:  Lubertha Basque. Jerl Santos, M.D.   ASSISTANT:  Lindwood Qua, P.A.   INDICATIONS FOR PROCEDURE:  The patient is a 77 year old woman who fell  today tripping over the vacuum at home.  She was unable to get up.  By x-  ray in the emergency room she had a significantly displaced and  comminuted supracondylar femur fracture.  Our plan at this point is to  stabilize this in hopes that she would get out of bed and potentially  walk.  Informed operative consent was obtained after discussion of  possible complications of reaction to anesthesia, infection, nonunion,  neurovascular injury.   SUMMARY OF FINDINGS AND PROCEDURE:  Under general anesthesia through a  retrograde approach.  We placed a DePuy Versanail to stabilize the  supracondylar fracture.  We placed three of the 5.5 mm diameter screws  distally and two of the 4.5 mm screws proximally to lock this in static  fashion due to the comminution.  I used fluoroscopy throughout the case  to make appropriate intraoperative decisions.  I read all these views  myself.  Bryna Colander assisted throughout and was invaluable to  completion of the case in that he helped position and retract while I  performed the procedure.  He also closed simultaneously to help minimize  OR time.   DESCRIPTION OF PROCEDURE:  The patient went to the operating suite where  general anesthetic was applied without difficulty.  She is positioned  supine and prepped, draped in normal sterile fashion.  After  administration of IV Kefzol the right leg was draped over a triangle  with the knee flexed about 45 degrees.  I made a anteromedial incision  along the medial border of the patellar tendon.  Dissection was carried  down to the intercondylar notch.  I placed a guidewire just anterior to  the front of this notch which was seen to go up proximally through the  distal and then into the proximal fragments.  We used fluoroscopy in two  planes to confirm central placement on both views.  We then over reamed  with a starter reamer and then sequentially reamed up to 13 mm.  We got  good chatter proximally.  We then placed a size 12 x 20 supracondylar  Versanail by DePuy.  We then used the attached locking guide to lock  from lateral to medial with three screws distally and two proximal.  The  three screws used distally were again 5.5 mm in diameter and the two  proximal screws were 4.5.  She had excellent bone quality proximal and  fairly good bone quality distal.  We used fluoroscopy to confirm  adequate placement of hardware and reduction of the fracture in two  planes and I read these views myself.  The wounds were then thoroughly  irrigated followed by reapproximation of the deep tissues at the  incision site with 0 Vicryl and subcutaneous tissues with 2-0 undyed  Vicryl.  Skin was closed with staples at the various incision sites.  Adaptic was applied to the wounds followed by dry gauze and loose Ace  wrap.  Estimated blood loss and intraoperative fluids can obtained from  anesthesia records.  No tourniquet was placed.   DISPOSITION:  The patient was extubated in the operating room and taken  to recovery in stable addition.  She was admitted to the orthopedic  surgery service for appropriate postop care to include perioperative  antibiotics and a course of  Lovenox for DVT prophylaxis along with  immediate mobilization out of bed.      Lubertha Basque Jerl Santos, M.D.  Electronically Signed     PGD/MEDQ  D:  06/25/2007  T:  06/27/2007  Job:  [id number]

## 2010-12-26 NOTE — Op Note (Signed)
Wendy Hopkins, Wendy Hopkins              ACCOUNT NO.:  [id number]   MEDICAL RECORD NO.:  [id number]          PATIENT TYPE:  OIB   LOCATION:  2899                         FACILITY:  [facility]   PHYSICIAN:  Duke Salvia, M.D.  DATE OF BIRTH:  1926-06-26   DATE OF PROCEDURE:  11/04/2004  DATE OF DISCHARGE:                                 OPERATIVE REPORT   PREOPERATIVE DIAGNOSIS:  Supraventricular tachycardia.   POSTOPERATIVE DIAGNOSIS:  Slow-fast AV nodal re-entrant tachycardia.   PROCEDURE PERFORMED:  Invasive electrophysiologic study, arrhythmia mapping,  isoproterenol infusion and radio frequency catheter ablation.   DESCRIPTION OF PROCEDURE:  Following the obtaining of informed consent, the  patient was brought to the electrophysiology laboratory and placed on the  fluoroscopic table in supine position.  After routine prep and drape,  cardiac catheterization was performed with local anesthesia and conscious  sedation.  Noninvasive blood pressure monitoring, transcutaneous oxygen  saturation monitoring were performed continuously throughout the procedure.  Following the procedure, the catheters were removed.  Hemostasis was  obtained and the patient was transferred to the floor in stable condition.   CATHETERS:  A 5 French quadripolar catheter was inserted via the left  femoral vein to the right ventricular apex.  A 5 French quadripolar catheter was inserted via the left femoral vein to  the AV junction to measure the His electrogram.  A 6 French octapolar catheter was inserted via the right femoral vein to the  coronary sinus.  A 7 French 4 mm deflectable tip catheter was inserted via the right femoral  vein to the posterior septal space.   Surface leads 1, aVF and V1 were monitored continuously throughout the  procedure.  Following insertion of the catheters the stimulation protocol  included:   Incremental atrial pacing.  Incremental ventricular pacing.  Single and double  atrial extrastimuli with pace cycle length of 600 and 500  msec in the presence and the absence of isoproterenol.   RESULTS:  Surface electrocardiogram and basic intervals.  Initial and Final (on isoproterenol).  Rhythm is sinus initial and sinus rhythm final.  Cycle length is 900 msec initial and 721 msec final.  P-R interval is 160 msec initial and 149 msec final.  QRS duration is 87 msec initial and 83 msec final.  QR interval is 404 msec initial and 374 msec final.  P-wave duration is 83 initial and 106 msec final.  Bundle branch block is absent and absent.  Pre-excitation is absent and absent.   A-H interval is 83 msec initial and 73 msec final.  H-V interval is 32 mm initial and 40 msec final.   AV NODAL FUNCTION:  AV Wenckebach was less than 400 msec.  VA Wenckebach was  400 msec.  AV nodal conduction.  Pre-ablation was discontinuous with an echo beat.  Post ablation was continuous.  In the presence and in the absence of  isoproterenol.  Retrograde conduction was continuous.   ACCESSORY PATHWAY FUNCTION:  No evidence of an accessory pathway was  identified.   Ventricular response to programmed stimulation:  Normal.  Programmed  ventricular stimulation is described  above.   ARRHYTHMIAS INDUCED:  AV nodal re-entry tachycardia was reproducibly  inducible.  The cycle length was 430 to 450 msec.  The tachycardia was  induced with atrial burst pacing at 400 msec or single atrial extrastimuli  at 600:360:340 msec.  It was terminated reproducibly with ventricular pacing  at 350 msec.   In a typical episode of tachycardia, the cycle length was 430 msec or so.  The VA time was 37 msec.  The H-A time was 57 msec and the A-H time was 364  msec.   Characteristics of the tachycardia included  a.  Measurements as noted above.  b.  Earliest atrial activation and His electrogram.  c.  A-H prolongation dependent for initiation.   Fluoroscopy time was less than five minutes at 7-1/2  frames per second.   RADIOFREQUENCY ENERGY:  A total of 96 seconds of RF energy were applied in  three applications.  Junctional rhythm ensued on each of them.  Following  the last application of radio frequency energy, no evidence of slow pathway  antegrade conduction was noted.   IMPRESSION:  1.  Normal sinus function.  2.  Normal atrial function.  3.  Dual AV nodal physiology with inducible slow-fast AV nodal re-entry      tachycardia.  RF energy successfully modified the slow pathway      eliminating slow pathway conduction and thereby the substrate for the      patient's AV nodal re-entry tachycardia.  4.  Normal His Purkinje system function.  5.  No accessory pathway.  6.  Normal ventricular response to programmed stimulation.   SUMMARY:  In conclusion, the results of electrophysiologic testing confirmed  AV nodal re-entry tachycardia the patient's clinical tachycardia.  Slow  pathway modification successfully eliminated the substrate.  The patient  should have a likelihood of recurrence of less than 2 to 3%.  We will plan  to decrease the patient's atenolol to 25 mg in the  morning assuming her blood pressure is okay.  Endocarditis prophylaxis for three months.  Aspirin daily for six weeks.      SCK/MEDQ  D:  11/04/2004  T:  11/04/2004  Job:  [id number]   cc:   Alfonse Alpers. Dagoberto Ligas, M.D.  [address], [address]  [place]  [place] [address]  Fax: [phone]

## 2011-01-16 ENCOUNTER — Ambulatory Visit (INDEPENDENT_AMBULATORY_CARE_PROVIDER_SITE_OTHER): Payer: Medicare Other | Admitting: *Deleted

## 2011-01-16 DIAGNOSIS — G459 Transient cerebral ischemic attack, unspecified: Secondary | ICD-10-CM

## 2011-01-16 DIAGNOSIS — I4891 Unspecified atrial fibrillation: Secondary | ICD-10-CM

## 2011-01-16 DIAGNOSIS — Z7901 Long term (current) use of anticoagulants: Secondary | ICD-10-CM

## 2011-01-22 ENCOUNTER — Other Ambulatory Visit: Payer: Self-pay | Admitting: Internal Medicine

## 2011-01-22 DIAGNOSIS — E041 Nontoxic single thyroid nodule: Secondary | ICD-10-CM

## 2011-02-09 ENCOUNTER — Ambulatory Visit (INDEPENDENT_AMBULATORY_CARE_PROVIDER_SITE_OTHER): Payer: Medicare Other | Admitting: Cardiology

## 2011-02-09 DIAGNOSIS — I4891 Unspecified atrial fibrillation: Secondary | ICD-10-CM

## 2011-02-23 ENCOUNTER — Ambulatory Visit (INDEPENDENT_AMBULATORY_CARE_PROVIDER_SITE_OTHER): Payer: Medicare Other | Admitting: *Deleted

## 2011-02-23 DIAGNOSIS — Z7901 Long term (current) use of anticoagulants: Secondary | ICD-10-CM

## 2011-02-23 DIAGNOSIS — G459 Transient cerebral ischemic attack, unspecified: Secondary | ICD-10-CM

## 2011-02-23 DIAGNOSIS — I4891 Unspecified atrial fibrillation: Secondary | ICD-10-CM

## 2011-02-23 LAB — POCT INR: INR: 2.8

## 2011-02-24 ENCOUNTER — Encounter: Payer: Self-pay | Admitting: Internal Medicine

## 2011-02-24 ENCOUNTER — Ambulatory Visit (INDEPENDENT_AMBULATORY_CARE_PROVIDER_SITE_OTHER): Payer: Medicare Other | Admitting: Internal Medicine

## 2011-02-24 DIAGNOSIS — E119 Type 2 diabetes mellitus without complications: Secondary | ICD-10-CM

## 2011-02-24 DIAGNOSIS — E785 Hyperlipidemia, unspecified: Secondary | ICD-10-CM

## 2011-02-24 DIAGNOSIS — Z794 Long term (current) use of insulin: Secondary | ICD-10-CM

## 2011-02-24 DIAGNOSIS — I10 Essential (primary) hypertension: Secondary | ICD-10-CM

## 2011-02-24 DIAGNOSIS — M810 Age-related osteoporosis without current pathological fracture: Secondary | ICD-10-CM

## 2011-02-24 NOTE — Assessment & Plan Note (Signed)
Follows with endo (kerr) for same - on 70/30 in place of prior SSI uncontrolled by hx -

## 2011-02-24 NOTE — Progress Notes (Signed)
  Subjective:    Patient ID: Wendy Hopkins, female    DOB: 08-12-1925, 77 y.o.   MRN: [id number]  HPI  Here for follow up - reviewed chronic medical issues today.  HTN - reports compliance with medications, no adv SE reports; no recent med changes; no CP, edema or HA.  Dyslipidemia - on crestor- no change in meds or dose, reports compliance with med as prescribed. No adv GI or mskel SE noted.  Afib - on chronic anticoag, follows with [facility]; hx TIA assoc with same - no SOB or palp  DM2 - insulin dep - changed to 70/30 from ssi summer 2012  - follows with endo (kerr) for same - cbg labile -range 150-250s - denies hypoglycemia symptoms  Past Medical History  Diagnosis Date  . Arthritis   . Diabetes mellitus   . Hyperlipidemia   . 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 Cardiac dysrhythmia, unspecified     A fib - chronic anticoag  . Closed fracture of unspecified part of femur   . Unspecified transient cerebral ischemia   . Edema   . Chronic diastolic heart failure   . Osteoporosis with fracture     compression fx lower T spine and t4    Review of Systems  Constitutional: Negative for fatigue.  Respiratory: Negative for shortness of breath.   Cardiovascular: Negative for chest pain.  Neurological: Negative for headaches.      Objective:    Physical Exam  BP 120/60  Pulse 57  Temp(Src) 97.4 F (36.3 C) (Oral)  Ht 5' (1.524 m)  Wt 127 lb (57.607 kg)  BMI 24.80 kg/m2  SpO2 97% Physical Exam  Constitutional: Wendy Hopkins is oriented to person, place, and time. Wendy Hopkins appears well-developed and well-nourished. No distress. dtr and spouse at side Neck: Normal range of motion. Neck supple. No JVD present. No thyromegaly present.  Cardiovascular: Normal rate, irregular rhythm and normal heart sounds.  No murmur heard. No BLE edema. Pulmonary/Chest: Effort normal and breath sounds normal. No respiratory distress. Wendy Hopkins has no wheezes.  Neurological: Wendy Hopkins is alert  and oriented to person, place, and time. No cranial nerve deficit. Coordination normal.  Psychiatric: Wendy Hopkins has a normal mood and affect. Her behavior is normal. Judgment and thought content normal.       Assessment & Plan:  See problem list. Medications and labs reviewed today. Time spent with pt/family today 27 minutes, greater than 50% time spent counseling patient on diabetes, hypertension and medication review. Also review of prior records/labs

## 2011-02-24 NOTE — Patient Instructions (Signed)
It was good to see you today. We have reviewed your prior records including labs and tests today Medications reviewed, no changes at this time. Keep working with Dr. Sharl Ma as ongoing Please schedule followup in 4 months, call sooner if problems.

## 2011-02-24 NOTE — Assessment & Plan Note (Signed)
BP Readings from Last 3 Encounters:  02/24/11 120/60  10/28/10 120/72  12/06/09 132/68   The current medical regimen is effective;  continue present plan and medications.

## 2011-02-24 NOTE — Assessment & Plan Note (Signed)
Managed by gyn - change in bisphos reviewed Hx compression fx - no pain at this time

## 2011-02-24 NOTE — Assessment & Plan Note (Signed)
On crestor - last lipids reviewed The current medical regimen is effective;  continue present plan and medications.

## 2011-02-25 ENCOUNTER — Telehealth: Payer: Self-pay | Admitting: Pharmacist

## 2011-02-25 NOTE — Telephone Encounter (Signed)
Message copied by Velda Shell on Wed Feb 25, 2011 10:12 AM ------      Message from: Duke Salvia      Created: Tue Feb 24, 2011 12:29 PM       High risk; would use lovenox      Thanks steve      ----- Message -----         From: Mariane Masters, PHARMD         Sent: 02/24/2011  10:51 AM           To: Duke Salvia, MD            Pt needs to have thyroid biopsy done at [facility].  Requesting to hold Coumadin x 4 days.  Need clearance before procedure can be scheduled.  Please advice.             [facility] contact: Tammy at [phone]

## 2011-02-27 ENCOUNTER — Telehealth: Payer: Self-pay | Admitting: Internal Medicine

## 2011-02-27 NOTE — Telephone Encounter (Signed)
Patient needs a thyroid biopsy done and was told she needs to come off coumadin for 4 days prior. She has been told she may have had a mild stroke. I will review with Dr. Graciela Husbands and call her back next week. Her bx has not been scheduled yet.

## 2011-02-27 NOTE — Telephone Encounter (Signed)
Pt calling re going off coumadin prior to a procedure, and something about having a shot

## 2011-03-02 NOTE — Telephone Encounter (Signed)
The patient has had a prior TIA. Hence she would be at high risk for coming off of Coumadin. If this is to be done, he should be done with Lovenox bridging. Thanks

## 2011-03-03 NOTE — Telephone Encounter (Signed)
I called the patient to make her aware of Dr. Odessa Fleming recommendations regarding the need for lovenox while off coumadin for her thyroid biopsy. She states someone had already called her this morning. I apologized to her since this was not documented in this phone note. She is scheduled for coumadin appointment tomorrow at 11:00am for lovenox teaching.

## 2011-03-03 NOTE — Telephone Encounter (Signed)
Wendy Hopkins with [facility] called and left message stating pt's thyroid biopsy is scheduled for 8/1 at 11am.  Pt set up for appt on 7/25 to discuss Lovenox bridge.  *Pt will need INR on 8/1 prior to her biopsy at 11am*

## 2011-03-04 ENCOUNTER — Ambulatory Visit (INDEPENDENT_AMBULATORY_CARE_PROVIDER_SITE_OTHER): Payer: Medicare Other | Admitting: *Deleted

## 2011-03-04 DIAGNOSIS — I4891 Unspecified atrial fibrillation: Secondary | ICD-10-CM

## 2011-03-04 DIAGNOSIS — G459 Transient cerebral ischemic attack, unspecified: Secondary | ICD-10-CM

## 2011-03-04 DIAGNOSIS — Z7901 Long term (current) use of anticoagulants: Secondary | ICD-10-CM

## 2011-03-04 MED ORDER — ENOXAPARIN SODIUM 60 MG/0.6ML ~~LOC~~ SOLN: 60.0000 mg | Freq: Two times a day (BID) | SUBCUTANEOUS | Status: DC

## 2011-03-04 NOTE — Patient Instructions (Addendum)
7/25- Take 5mg  of Coumadin 7/26- Take 5mg  of Coumadin 7/27- Take 5mg  of Coumadin 7/28- No Coumadin or Lovenox 7/29- Start Lovenox 60mg  injection once in AM and once in PM 7/30- Lovenox 60mg  injection in AM and PM 7/31- Lovenox 60mg  injection in AM and PM 8/1- Day of Procedure- Do not take any Lovenox prior to procedure.  We will give you follow up instructions that day for after your procedure.

## 2011-03-11 ENCOUNTER — Ambulatory Visit (INDEPENDENT_AMBULATORY_CARE_PROVIDER_SITE_OTHER): Payer: Medicare Other | Admitting: *Deleted

## 2011-03-11 ENCOUNTER — Other Ambulatory Visit (HOSPITAL_COMMUNITY)
Admission: RE | Admit: 2011-03-11 | Discharge: 2011-03-11 | Disposition: A | Discharge status: Home / Self Care | Payer: Medicare Other | Source: Ambulatory Visit | Attending: Interventional Radiology | Admitting: Interventional Radiology

## 2011-03-11 ENCOUNTER — Ambulatory Visit
Admission: RE | Admit: 2011-03-11 | Discharge: 2011-03-11 | Disposition: A | Discharge status: Home / Self Care | Payer: Medicare Other | Source: Ambulatory Visit | Attending: Internal Medicine | Admitting: Internal Medicine

## 2011-03-11 DIAGNOSIS — Z7901 Long term (current) use of anticoagulants: Secondary | ICD-10-CM

## 2011-03-11 DIAGNOSIS — I4891 Unspecified atrial fibrillation: Secondary | ICD-10-CM

## 2011-03-11 DIAGNOSIS — E041 Nontoxic single thyroid nodule: Secondary | ICD-10-CM

## 2011-03-11 DIAGNOSIS — G459 Transient cerebral ischemic attack, unspecified: Secondary | ICD-10-CM

## 2011-03-11 DIAGNOSIS — E049 Nontoxic goiter, unspecified: Secondary | ICD-10-CM | POA: Insufficient documentation

## 2011-03-11 NOTE — Patient Instructions (Signed)
8/1- Take 1 1/2 tablets of Coumadin and Lovenox 60mg  in PM 8/2- Take 1 1/2 tablets of Coumadin and Lovenox 60mg  in AM and PM 8/3- Take 1 tablet of Coumadin and Lovenox 60mg  in AM and PM 8/4- Take 1 tablet of Coumadin and Lovenox 60mg  in AM and PM 8/5- Take 1 tablet of Coumadin and Lovenox 60mg  in AM and PM 8/6- Recheck INR.

## 2011-03-16 ENCOUNTER — Ambulatory Visit (INDEPENDENT_AMBULATORY_CARE_PROVIDER_SITE_OTHER): Payer: Medicare Other | Admitting: *Deleted

## 2011-03-16 DIAGNOSIS — G459 Transient cerebral ischemic attack, unspecified: Secondary | ICD-10-CM

## 2011-03-16 DIAGNOSIS — Z7901 Long term (current) use of anticoagulants: Secondary | ICD-10-CM

## 2011-03-16 DIAGNOSIS — I4891 Unspecified atrial fibrillation: Secondary | ICD-10-CM

## 2011-03-16 LAB — POCT INR: INR: 1.8

## 2011-03-23 ENCOUNTER — Encounter: Payer: Medicare Other | Admitting: *Deleted

## 2011-03-30 ENCOUNTER — Ambulatory Visit (INDEPENDENT_AMBULATORY_CARE_PROVIDER_SITE_OTHER): Payer: Medicare Other | Admitting: *Deleted

## 2011-03-30 DIAGNOSIS — G459 Transient cerebral ischemic attack, unspecified: Secondary | ICD-10-CM

## 2011-03-30 DIAGNOSIS — I4891 Unspecified atrial fibrillation: Secondary | ICD-10-CM

## 2011-03-30 DIAGNOSIS — Z7901 Long term (current) use of anticoagulants: Secondary | ICD-10-CM

## 2011-03-30 LAB — POCT INR: INR: 1.9

## 2011-04-07 ENCOUNTER — Other Ambulatory Visit (HOSPITAL_COMMUNITY): Payer: Self-pay | Admitting: Internal Medicine

## 2011-04-07 DIAGNOSIS — E042 Nontoxic multinodular goiter: Secondary | ICD-10-CM

## 2011-04-15 ENCOUNTER — Ambulatory Visit (INDEPENDENT_AMBULATORY_CARE_PROVIDER_SITE_OTHER): Payer: Medicare Other | Admitting: *Deleted

## 2011-04-15 DIAGNOSIS — I4891 Unspecified atrial fibrillation: Secondary | ICD-10-CM

## 2011-04-15 DIAGNOSIS — Z7901 Long term (current) use of anticoagulants: Secondary | ICD-10-CM

## 2011-04-15 DIAGNOSIS — G459 Transient cerebral ischemic attack, unspecified: Secondary | ICD-10-CM

## 2011-04-22 ENCOUNTER — Encounter (HOSPITAL_COMMUNITY)
Admission: RE | Admit: 2011-04-22 | Discharge: 2011-04-22 | Disposition: A | Discharge status: Home / Self Care | Payer: Medicare Other | Source: Ambulatory Visit | Attending: Internal Medicine | Admitting: Internal Medicine

## 2011-04-22 DIAGNOSIS — E042 Nontoxic multinodular goiter: Secondary | ICD-10-CM

## 2011-04-23 ENCOUNTER — Ambulatory Visit (HOSPITAL_COMMUNITY)
Admission: RE | Admit: 2011-04-23 | Discharge: 2011-04-23 | Disposition: A | Discharge status: Home / Self Care | Payer: Medicare Other | Source: Ambulatory Visit | Attending: Internal Medicine | Admitting: Internal Medicine

## 2011-04-23 DIAGNOSIS — E042 Nontoxic multinodular goiter: Secondary | ICD-10-CM | POA: Insufficient documentation

## 2011-04-23 MED ORDER — SODIUM IODIDE I 131 CAPSULE
10.0000 | Freq: Once | INTRAVENOUS | Status: AC | PRN
  Administered 2011-04-22: 10 via ORAL

## 2011-04-23 MED ORDER — SODIUM PERTECHNETATE TC 99M INJECTION
10.0000 | Freq: Once | INTRAVENOUS | Status: AC | PRN
  Administered 2011-04-23: 10 via INTRAVENOUS

## 2011-04-23 MED ORDER — SODIUM IODIDE I 131 CAPSULE
10.0000 | Freq: Once | INTRAVENOUS | Status: AC | PRN
  Administered 2011-04-23: 10 via ORAL

## 2011-04-30 ENCOUNTER — Ambulatory Visit (INDEPENDENT_AMBULATORY_CARE_PROVIDER_SITE_OTHER): Payer: Medicare Other | Admitting: *Deleted

## 2011-04-30 DIAGNOSIS — Z7901 Long term (current) use of anticoagulants: Secondary | ICD-10-CM

## 2011-04-30 DIAGNOSIS — G459 Transient cerebral ischemic attack, unspecified: Secondary | ICD-10-CM

## 2011-04-30 DIAGNOSIS — I4891 Unspecified atrial fibrillation: Secondary | ICD-10-CM

## 2011-05-19 ENCOUNTER — Other Ambulatory Visit: Payer: Self-pay | Admitting: Internal Medicine

## 2011-05-19 DIAGNOSIS — E042 Nontoxic multinodular goiter: Secondary | ICD-10-CM

## 2011-05-19 LAB — CBC
HCT: 22.6 — ABNORMAL LOW
HCT: 24.6 — ABNORMAL LOW
HCT: 33.1 — ABNORMAL LOW
Hemoglobin: 10.8 — ABNORMAL LOW
MCHC: 34.2
MCHC: 35.5
MCV: 86.2
MCV: 86.7
MCV: 87.1
MCV: 88
Platelets: 138 — ABNORMAL LOW
Platelets: 143 — ABNORMAL LOW
Platelets: 162
Platelets: 203
RBC: 2.6 — ABNORMAL LOW
RBC: 2.75 — ABNORMAL LOW
RBC: 3.76 — ABNORMAL LOW
RDW: 14.5
RDW: 14.5
WBC: 6.7
WBC: 7.1
WBC: 7.6
WBC: 8

## 2011-05-19 LAB — CROSSMATCH

## 2011-05-19 LAB — APTT: aPTT: 30

## 2011-05-19 LAB — BASIC METABOLIC PANEL
BUN: 11
BUN: 9
CO2: 26
CO2: 29
Calcium: 7.8 — ABNORMAL LOW
Calcium: 9.1
Chloride: 101
Creatinine, Ser: 0.77
Creatinine, Ser: 0.86
Creatinine, Ser: 0.89
GFR calc Af Amer: >60
GFR calc non Af Amer: >60
Glucose, Bld: 106 — ABNORMAL HIGH
Glucose, Bld: 178 — ABNORMAL HIGH

## 2011-05-19 LAB — DIFFERENTIAL
Basophils Absolute: 0
Basophils Relative: 0
Eosinophils Absolute: 0 — ABNORMAL LOW
Monocytes Absolute: 0.3
Neutro Abs: 7
Neutrophils Relative %: 84 — ABNORMAL HIGH

## 2011-05-19 LAB — ABO/RH: ABO/RH(D): O NEG

## 2011-05-21 ENCOUNTER — Ambulatory Visit (INDEPENDENT_AMBULATORY_CARE_PROVIDER_SITE_OTHER): Payer: Medicare Other | Admitting: *Deleted

## 2011-05-21 DIAGNOSIS — Z7901 Long term (current) use of anticoagulants: Secondary | ICD-10-CM

## 2011-05-21 DIAGNOSIS — G459 Transient cerebral ischemic attack, unspecified: Secondary | ICD-10-CM

## 2011-05-21 DIAGNOSIS — I4891 Unspecified atrial fibrillation: Secondary | ICD-10-CM

## 2011-05-21 LAB — POCT INR: INR: 2.7

## 2011-05-29 ENCOUNTER — Encounter (INDEPENDENT_AMBULATORY_CARE_PROVIDER_SITE_OTHER): Payer: Self-pay | Admitting: Surgery

## 2011-05-29 ENCOUNTER — Encounter (INDEPENDENT_AMBULATORY_CARE_PROVIDER_SITE_OTHER): Payer: Self-pay

## 2011-05-30 ENCOUNTER — Other Ambulatory Visit: Payer: Self-pay | Admitting: Internal Medicine

## 2011-06-01 ENCOUNTER — Ambulatory Visit (INDEPENDENT_AMBULATORY_CARE_PROVIDER_SITE_OTHER): Payer: Medicare Other | Admitting: Surgery

## 2011-06-01 ENCOUNTER — Encounter (INDEPENDENT_AMBULATORY_CARE_PROVIDER_SITE_OTHER): Payer: Self-pay | Admitting: Surgery

## 2011-06-01 DIAGNOSIS — E042 Nontoxic multinodular goiter: Secondary | ICD-10-CM

## 2011-06-01 NOTE — Progress Notes (Signed)
Chief Complaint  Patient presents with  . Thyroid Nodule    Referral from Dr. Talmage Coin - abnormal cytopathology    HISTORY: Patient is a 77 year old female seen at the request of her endocrinologist for her thyroid nodule with suspicious cytopathology. Patient had previously been followed by an endocrinologist for many years. She was known to have palpable thyroid nodules which were clinically stable.  She underwent a nuclear medicine thyroid scan which showed a dominant cold nodule on the right side. Ultrasound of the thyroid was performed in May 2012. This showed a dominant nodule in the right lobe measuring 3.2 cm and 2 smaller nodules in the right lobe measuring 1.6 and 1.3 cm respectively. In the left lobe was a 1.3 cm nodule and a 7 mm nodule. Ultrasound-guided fine-needle aspiration biopsy was performed. This demonstrated benign follicular cells. There were calcifications. 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 Re-aspiration was recommended if clinically indicated.  Patient has no prior history of thyroid disease other than palpable thyroid nodules. She has never had head or neck surgery. She has never been on thyroid medication. There is a family history of benign thyroid disease in her sister who underwent thyroidectomy.  There is no family history of other endocrinopathy.   Past Medical History  Diagnosis Date  . Arthritis   . Diabetes mellitus   . Hyperlipidemia   . 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 Cardiac dysrhythmia, unspecified     A fib - chronic anticoag  . Closed fracture of unspecified part of femur   . Unspecified transient cerebral ischemia   . Edema   . Chronic diastolic heart failure   . Osteoporosis with fracture     compression fx lower T spine and t4  . Thyroid nodule   . GERD (gastroesophageal reflux disease)   . Hypertension   . Atrial fibrillation   . Constipation   . Osteoporosis      Current Outpatient  Prescriptions  Medication Sig Dispense Refill  . ACCU-CHEK AVIVA test strip Check twice daily      . atenolol (TENORMIN) 25 MG tablet Take 25 mg by mouth daily.        . Calcium Carbonate-Vitamin D (CALCIUM 600-D) 600-400 MG-UNIT per tablet Take 1 tablet by mouth daily.        . Cholecalciferol (VITAMIN D) 2000 UNITS CAPS Take 2,000 Units by mouth daily.        . CRESTOR 5 MG tablet TAKE 1 TABLET BY MOUTH DAILY  90 tablet  1  . insulin lispro protamine-insulin lispro (HUMALOG 75/25) (75-25) 100 UNIT/ML SUSP Inject 12 Units into the skin 2 (two) times daily with a meal. Sliding Scales      . warfarin (COUMADIN) 5 MG tablet Take by mouth as directed.           Allergies  Allergen Reactions  . Codeine     Vomiting      Family History  Problem Relation Age of Onset  . Hypertension Father     Parent not sure which 1  . Diabetes Brother   . Lung cancer Other   . Cancer Sister   . Parkinsonism Sister      History   Social History  . Marital Status: Married    Spouse Name: N/A    Number of Children: N/A  . Years of Education: N/A   Social History Main Topics  . Smoking status: Never Smoker   . Smokeless  tobacco: Never Used  . Alcohol Use: No  . Drug Use: No  . Sexually Active: None   Other Topics Concern  . None   Social History Narrative  . None     REVIEW OF SYSTEMS - PERTINENT POSITIVES ONLY: Patient denies any symptoms related to the thyroid. She does not have tremors. She denies any compressive symptoms.   EXAM: Filed Vitals:   06/01/11 0850  BP: 118/68  Pulse: 60  Temp: 96 F (35.6 C)  Resp: 16    HEENT: normocephalic; pupils equal and reactive; sclerae clear; dentition good; mucous membranes moist NECK:  Left lobe shows no palpable nodules; right lobe has a dominant nodule occupying the lower portion. It is nontender. It is mobile with swallowing; asymmetric on extension; no palpable anterior or posterior cervical lymphadenopathy; no supraclavicular  masses; no tenderness CHEST: clear to auscultation bilaterally without rales, rhonchi, or wheezes CARDIAC: without significant murmur; peripheral pulses are full EXT:  non-tender without edema; no deformity NEURO: no gross focal deficits; no sign of tremor   LABORATORY RESULTS: See E-Chart for most recent results   RADIOLOGY RESULTS: See E-Chart or I-Site for most recent results   IMPRESSION: #1 bilateral thyroid nodules, clinically stable #2 dominant right thyroid nodule, 3.2 cm, with benign cytopathology but possible presence of psammoma bodies on aspiration #3 atrial fibrillation #4 diabetes mellitus   PLAN: I discussed options for management with the patient and her daughter at length today. We discussed observation versus surgical resection.  Observation would involve repeat physical exams, repeat thyroid ultrasounds, and laboratory testing. Patient is scheduled for a follow up ultrasound next month. If there were any significant change in the size or character of the nodules or the development of lymphadenopathy, then I would recommend total thyroidectomy.  The procedure of total thyroidectomy as discussed at length. We discussed the risk and benefits. We discussed the hospital stay. We discussed the postoperative recovery and the need for lifelong thyroid hormone replacement therapy. They understand.  Patient would like to pursue a course of observation at this time. I think that is reasonable. She will have her ultrasound performed in November at [facility]. She will return to see me for followup in 6 months.   Velora Heckler, MD, FACS General & Endocrine Surgery [facility]      Visit Diagnoses: 1. Multinodular goiter (nontoxic), dominant right thyroid nodule     Primary Care Physician: Rene Paci, MD, MD  Endo: Dr. Talmage Coin Cardiac:  Dr. Berton Mount

## 2011-06-01 NOTE — Patient Instructions (Signed)
Keep appointment for thyroid ultrasound in November.  TMG

## 2011-06-10 DIAGNOSIS — Z0279 Encounter for issue of other medical certificate: Secondary | ICD-10-CM

## 2011-06-15 ENCOUNTER — Ambulatory Visit
Admission: RE | Admit: 2011-06-15 | Discharge: 2011-06-15 | Disposition: A | Discharge status: Home / Self Care | Payer: Medicare Other | Source: Ambulatory Visit | Attending: Internal Medicine | Admitting: Internal Medicine

## 2011-06-15 DIAGNOSIS — E042 Nontoxic multinodular goiter: Secondary | ICD-10-CM

## 2011-06-22 ENCOUNTER — Ambulatory Visit (INDEPENDENT_AMBULATORY_CARE_PROVIDER_SITE_OTHER): Payer: Medicare Other | Admitting: *Deleted

## 2011-06-22 DIAGNOSIS — Z7901 Long term (current) use of anticoagulants: Secondary | ICD-10-CM

## 2011-06-22 DIAGNOSIS — G459 Transient cerebral ischemic attack, unspecified: Secondary | ICD-10-CM

## 2011-06-22 DIAGNOSIS — I4891 Unspecified atrial fibrillation: Secondary | ICD-10-CM

## 2011-06-23 ENCOUNTER — Encounter: Payer: Self-pay | Admitting: Internal Medicine

## 2011-06-23 ENCOUNTER — Ambulatory Visit (INDEPENDENT_AMBULATORY_CARE_PROVIDER_SITE_OTHER): Payer: Medicare Other | Admitting: Internal Medicine

## 2011-06-23 DIAGNOSIS — I10 Essential (primary) hypertension: Secondary | ICD-10-CM

## 2011-06-23 DIAGNOSIS — E042 Nontoxic multinodular goiter: Secondary | ICD-10-CM

## 2011-06-23 DIAGNOSIS — I498 Other specified cardiac arrhythmias: Secondary | ICD-10-CM

## 2011-06-23 DIAGNOSIS — E119 Type 2 diabetes mellitus without complications: Secondary | ICD-10-CM

## 2011-06-23 NOTE — Assessment & Plan Note (Signed)
Questionable pathology on recent aspiration by endocrine (Dr. Sharl Ma) Eval by Dr. Gerrit Friends October 22 for same> discussed total thyroidectomy versus observation: observation and six-month followup US planned

## 2011-06-23 NOTE — Assessment & Plan Note (Signed)
Follows with endo (kerr) for same - on 70/30 in place of prior SSI uncontrolled by hx - but reports improving Lab Results  Component Value Date   HGBA1C 11.3* 10/28/2010

## 2011-06-23 NOTE — Progress Notes (Signed)
Subjective:    Patient ID: Wendy Hopkins, female    DOB: July 20, 1926, 77 y.o.   MRN: [id number]  HPI  Here for follow up - reviewed chronic medical issues today.  HTN - reports compliance with medications, no adv SE reports; no recent med changes; no CP, edema or HA.  Dyslipidemia - on crestor- no change in meds or dose, reports compliance with med as prescribed. No adv GI or mskel SE noted.  Afib - on chronic anticoag, follows with [facility] and Dr. Graciela Husbands; hx TIA assoc with same - chronic mild dyspnea on exertion, mild and unchanged. Per family, symptoms have been attributed to mild bradycardia.  previously offered pacemaker for treatment of same but declined.   DM2 - insulin dep - on 70/30 and follows with endo ([facility]) for same - cbg labile -range 150-250s - denies hypoglycemia symptoms  Past Medical History  Diagnosis Date  . Arthritis   . Diabetes mellitus   . Hyperlipidemia   . 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 Closed fracture of unspecified part of femur   . Unspecified transient cerebral ischemia   . Edema   . Chronic diastolic heart failure   . Osteoporosis with fracture     compression fx lower T spine and t4  . Thyroid nodule     dominate R nodule, s/p aspiration>abn path>6 mo obs planned ([facility] 06/01/11)  . GERD (gastroesophageal reflux disease)   . Hypertension   . Atrial fibrillation     chronic anticoag  . Constipation   . Osteoporosis     Review of Systems  Constitutional: Negative for fatigue and unexpected weight change.  Respiratory: Positive for shortness of breath (chronic, mild. Ongoing since attempted cardio synchronization). Negative for chest tightness and wheezing.   Cardiovascular: Negative for chest pain and palpitations.  Neurological: Negative for headaches.      Objective:    Physical Exam BP 110/62  Pulse 48  Temp(Src) 98 F (36.7 C) (Oral)  Wt 133 lb 6.4 oz (60.51 kg)  SpO2 98% Wt Readings from Last 3  Encounters:  06/23/11 133 lb 6.4 oz (60.51 kg)  06/01/11 130 lb 8 oz (59.194 kg)  02/24/11 127 lb (57.607 kg)   Constitutional: She appears well-developed and well-nourished. No distress. dtr and spouse at side Neck: Normal range of motion. Neck supple. No JVD present.   Cardiovascular: Slightly bradycardic rate, irregular rhythm and normal heart sounds.  No murmur heard. No BLE edema. Pulmonary/Chest: Effort normal and breath sounds normal. No respiratory distress. She has no wheezes.  Neurological: She is alert and oriented to person, place, and time. No cranial nerve deficit. Coordination normal.  Psychiatric: She has a normal mood and affect. Her behavior is normal. Judgment and thought content normal.   Lab Results  Component Value Date   WBC 5.3 08/01/2010   HGB 11.7 08/01/2010   HCT 37.6 08/01/2010   PLT 203 08/01/2010   GLUCOSE 349* 10/28/2010   CHOL 135 10/28/2010   TRIG 79.0 10/28/2010   HDL 58.40 10/28/2010   LDLCALC 61 10/28/2010   ALT 19 08/01/2010   AST 30 08/01/2010   NA 132* 10/28/2010   K 5.4* 10/28/2010   CL 97 10/28/2010   CREATININE 1.1 10/28/2010   BUN 15 10/28/2010   CO2 31 10/28/2010   TSH 2.163 Test methodology is 3rd generation TSH 08/13/2009   INR 2.4 06/22/2011   HGBA1C 11.3* 10/28/2010        Assessment &  Plan:  See problem list. Medications and labs reviewed today. Time spent with pt/family today 25 minutes, greater than 50% time spent counseling patient on dyspnea symptoms in relation to bradycardia , need for cardiac followup , review of thyroid nodule management and medication review. Also review of interval history records with endocrine and surgery

## 2011-06-23 NOTE — Patient Instructions (Signed)
It was good to see you today. We have reviewed your prior records including labs and tests today Medications reviewed, no changes at this time. Keep working with Dr. Sharl Ma as ongoing we'll make referral for follow up with Dr. Graciela Husbands to discuss your slow heart rate . Our office will contact you regarding appointment(s) once made. Please schedule followup in 6 months, call sooner if problems.

## 2011-06-23 NOTE — Assessment & Plan Note (Signed)
BP Readings from Last 3 Encounters:  06/23/11 110/62  06/01/11 118/68  02/24/11 120/60   The current medical regimen is effective;  continue present plan and medications.

## 2011-06-23 NOTE — Assessment & Plan Note (Signed)
Chronic issue: prev offered bu declined PPM recommended follow up with cards and consider reducing beta-blocker dose for other antihtn

## 2011-07-13 ENCOUNTER — Other Ambulatory Visit: Payer: Self-pay | Admitting: *Deleted

## 2011-07-13 MED ORDER — WARFARIN SODIUM 5 MG PO TABS: 5.0000 mg | ORAL_TABLET | ORAL | Status: DC

## 2011-07-20 ENCOUNTER — Ambulatory Visit (INDEPENDENT_AMBULATORY_CARE_PROVIDER_SITE_OTHER): Payer: Medicare Other | Admitting: *Deleted

## 2011-07-20 DIAGNOSIS — I4891 Unspecified atrial fibrillation: Secondary | ICD-10-CM

## 2011-07-20 DIAGNOSIS — Z7901 Long term (current) use of anticoagulants: Secondary | ICD-10-CM

## 2011-07-20 DIAGNOSIS — G459 Transient cerebral ischemic attack, unspecified: Secondary | ICD-10-CM

## 2011-07-20 LAB — POCT INR: INR: 2.5

## 2011-07-24 ENCOUNTER — Ambulatory Visit (INDEPENDENT_AMBULATORY_CARE_PROVIDER_SITE_OTHER): Payer: Medicare Other | Admitting: Internal Medicine

## 2011-07-24 ENCOUNTER — Encounter: Payer: Self-pay | Admitting: Internal Medicine

## 2011-07-24 VITALS — BP 159/70 | HR 67 | Ht 59.0 in | Wt 132.8 lb

## 2011-07-24 DIAGNOSIS — I498 Other specified cardiac arrhythmias: Secondary | ICD-10-CM

## 2011-07-24 DIAGNOSIS — I4891 Unspecified atrial fibrillation: Secondary | ICD-10-CM

## 2011-07-24 DIAGNOSIS — I10 Essential (primary) hypertension: Secondary | ICD-10-CM

## 2011-07-24 LAB — BASIC METABOLIC PANEL
Chloride: 101 mEq/L (ref 96–112)
Potassium: 5.2 mEq/L — ABNORMAL HIGH (ref 3.5–5.1)

## 2011-07-24 NOTE — Assessment & Plan Note (Signed)
As above.

## 2011-07-24 NOTE — Assessment & Plan Note (Addendum)
This is permanent. It is not clear whether there is chronotropic competence or not. There has been a concern about bradycardia in the past. We will plan to discontinue the atenolol and use alternative antihypertensive therapy. With the concomitant edema, we will use a diuretic. Reviewing her laboratories last March she had a potassium of 5.4; we will recheck a metabolic profile today To make a determine as to whether a ACE inhibitor would be appropriate or not.  Following the change of her antihypertensive, we'll use a Holter monitor to assess Chronotropic competence. In the event that this is insufficient in this assessment, would undertake treadmill testing probably with concomitant nuclear imaging as I cannot find data either E. Chart or epic and her risk factors are sufficiently high. She is not quite sure how far she was a good on this diagnostic catheter the we will review this and each juncture

## 2011-07-24 NOTE — Patient Instructions (Signed)
Your physician recommends that you have lab work today: bmp  Your physician has recommended you make the following change in your medication:  1) On Monday 12/17- stop atenolol. We will call you with the results of your labwork from today and let you know if we will put you on something different for you blood pressure  Your physician has recommended that you wear a 48 hour holter monitor 2 weeks after stopping atenolol (around 08/10/11). Holter monitors are medical devices that record the heart's electrical activity. Doctors most often use these monitors to diagnose arrhythmias. Arrhythmias are problems with the speed or rhythm of the heartbeat. The monitor is a small, portable device. You can wear one while you do your normal daily activities. This is usually used to diagnose what is causing palpitations/syncope (passing out).  Your physician wants you to follow-up in: 1 year pending the results of your heart monitor. You will receive a reminder letter in the mail two months in advance. If you don't receive a letter, please call our office to schedule the follow-up appointment.

## 2011-07-24 NOTE — Progress Notes (Signed)
  HPI  Wendy Hopkins is a 77 y.o. female Seen in followup for atrial fibrillation with a CHADS score of 5-6 with a prior TIA/stroke  Sis taking her Coumadin and if finally got her well regulated.  She underwent cardioversion in January 2011; this failed to hold sinus rhythm.  We have discussed in the past chronotropic competence. A trial of atenolol withdrawal had no impact on her symptoms. She was uninterested in undertaking the treadmill assessment.  She is referred back now by Dr. Felicity Coyer because of shortness of breath with exertion. Review the notes that 2 years this has been a recurring theme.  She is more interested at this time in pursuing an evaluation.  Past Medical History  Diagnosis Date  . Arthritis   . Diabetes mellitus   . Hyperlipidemia   . 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 Closed fracture of unspecified part of femur   . Unspecified transient cerebral ischemia   . Edema   . Chronic diastolic heart failure   . Osteoporosis with fracture     compression fx lower T spine and t4  . Thyroid nodule     dominate R nodule, s/p aspiration>abn path>6 mo obs planned ([facility] 06/01/11)  . GERD (gastroesophageal reflux disease)   . Hypertension   . Atrial fibrillation     chronic anticoag  . Constipation   . Osteoporosis     Past Surgical History  Procedure Date  . Right femur 06/25/07    Intramedullary open reduction internal fixation  . Vitrectomy 2007    right eye with cataract repair - dr. Gwendalyn Ege  . Tonsillectomy     Current Outpatient Prescriptions  Medication Sig Dispense Refill  . ACCU-CHEK AVIVA test strip Check twice daily      . atenolol (TENORMIN) 25 MG tablet Take 25 mg by mouth daily.        . Calcium Carbonate-Vitamin D (CALCIUM 600-D) 600-400 MG-UNIT per tablet Take 1 tablet by mouth daily.        . Cholecalciferol (VITAMIN D) 2000 UNITS CAPS Take 2,000 Units by mouth daily.        . CRESTOR 5 MG tablet TAKE 1 TABLET BY  MOUTH DAILY  90 tablet  1  . insulin lispro protamine-insulin lispro (HUMALOG 75/25) (75-25) 100 UNIT/ML SUSP Inject 12 Units into the skin 2 (two) times daily with a meal. Sliding Scales      . Lite Touch Lancets MISC Use as directed      . warfarin (COUMADIN) 5 MG tablet Take 1 tablet (5 mg total) by mouth as directed.  40 tablet  3    Allergies  Allergen Reactions  . Codeine     Vomiting     Review of Systems negative except from HPI and PMH  Physical Exam Well developed and well nourished Sad appearing elderly woman in no acute distress HENT normal E scleral and icterus clear Neck Supple JVP flat; carotids brisk and full Clear to ausculation irregularly irregular rate and rhythm, 2/6 systolic m gallops or rub Soft with active bowel sounds No clubbing cyanosis Trace and pitting  Edema Alert and oriented, grossly normal motor and sensory function Skin Warm and Dry  What occurred when dated today demonstrates atrial fibrillation at a rate of 67 Intervals-/0.08/24 for your suggestion of a prior anteroseptal infarction. There is nonspecific ST-T changes.  Assessment and  Plan

## 2011-07-28 ENCOUNTER — Telehealth: Payer: Self-pay | Admitting: Internal Medicine

## 2011-07-28 NOTE — Telephone Encounter (Signed)
New message:  Pt had lab work on 12-14 but has not gotten any results.  Please call her back.

## 2011-07-28 NOTE — Telephone Encounter (Signed)
Returned call x2, msg left labs were ok, glucose was elevated and should f/u with pcp. Asked her to call back with further questions or concerns, number left in msg.

## 2011-08-06 ENCOUNTER — Other Ambulatory Visit: Payer: Self-pay | Admitting: *Deleted

## 2011-08-06 DIAGNOSIS — I10 Essential (primary) hypertension: Secondary | ICD-10-CM

## 2011-08-06 MED ORDER — FUROSEMIDE 20 MG PO TABS: ORAL_TABLET | ORAL | Status: DC

## 2011-08-10 ENCOUNTER — Encounter (INDEPENDENT_AMBULATORY_CARE_PROVIDER_SITE_OTHER): Payer: Medicare Other

## 2011-08-10 DIAGNOSIS — I4891 Unspecified atrial fibrillation: Secondary | ICD-10-CM

## 2011-08-14 ENCOUNTER — Ambulatory Visit (INDEPENDENT_AMBULATORY_CARE_PROVIDER_SITE_OTHER): Payer: Medicare Other | Admitting: Internal Medicine

## 2011-08-14 ENCOUNTER — Encounter: Payer: Self-pay | Admitting: Internal Medicine

## 2011-08-14 DIAGNOSIS — I498 Other specified cardiac arrhythmias: Secondary | ICD-10-CM

## 2011-08-14 DIAGNOSIS — I5032 Chronic diastolic (congestive) heart failure: Secondary | ICD-10-CM

## 2011-08-14 DIAGNOSIS — I4891 Unspecified atrial fibrillation: Secondary | ICD-10-CM

## 2011-08-14 DIAGNOSIS — I509 Heart failure, unspecified: Secondary | ICD-10-CM

## 2011-08-14 MED ORDER — ATENOLOL 25 MG PO TABS: 25.0000 mg | ORAL_TABLET | Freq: Every day | ORAL | Status: DC

## 2011-08-14 MED ORDER — FUROSEMIDE 40 MG PO TABS: 40.0000 mg | ORAL_TABLET | Freq: Every day | ORAL | Status: DC

## 2011-08-14 NOTE — Patient Instructions (Signed)
Your physician has recommended you make the following change in your medication:  1) Increase lasix (furosemide) to 40mg  once daily. 2) Restart atenolol 25mg  once daily.  Your physician wants you to follow-up in: 6 months with Dr. Graciela Husbands. You will receive a reminder letter in the mail two months in advance. If you don't receive a letter, please call our office to schedule the follow-up appointment.

## 2011-08-14 NOTE — Assessment & Plan Note (Signed)
The patient has atrial fibrillation which is permanent. Average heart rate was 80 with peaks over 100 with even minimal exertion and with persistent exertion the average was over 120 beats per minute. We will resume her atenolol.

## 2011-08-14 NOTE — Assessment & Plan Note (Signed)
Reasonably controlled 

## 2011-08-14 NOTE — Progress Notes (Signed)
HPI  Wendy Hopkins is a 78 y.o. female  Seen in followup for atrial fibrillation with a CHADS score of 5-6 with a prior TIA/stroke  She was seen in December because of shortness of breath that was thought to be in part related to falling overload as well as bradycardia. Her beta blockers were discontinued and a diuretic initiated  laboratories at that time showed a mildly elevated potassium and a ACE inhibitor was not initiated.  By verbal interim report, she was feeling better. There is less edema in response to diuretics. The dose was decreased this week from 40-20 and there is an increase in her edema again.   A Holter monitor obtained just a few days ago demonstrates poorly controlled ventricular response. His average heart rate of 88 minimum rate of 66 a peak heart rate of 143 and during her exertional hours of 10 to to her average heart rate was over 120.  She has been cardioverted in the past and has failed to hold sinus rhythm. Her atrial fibrillation is presumed to be permanent      Past Medical History  Diagnosis Date  . Arthritis   . Diabetes mellitus   . Hyperlipidemia   . 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 Closed fracture of unspecified part of femur   . Unspecified transient cerebral ischemia   . Edema   . Chronic diastolic heart failure   . Osteoporosis with fracture     compression fx lower T spine and t4  . Thyroid nodule     dominate R nodule, s/p aspiration>abn path>6 mo obs planned ([facility] 06/01/11)  . GERD (gastroesophageal reflux disease)   . Hypertension   . Campath-induced atrial fibrillation     chronic anticoag  . Constipation   . Osteoporosis     Past Surgical History  Procedure Date  . Right femur 06/25/07    Intramedullary open reduction internal fixation  . Vitrectomy 2007    right eye with cataract repair - dr. Gwendalyn Ege  . Tonsillectomy     Current Outpatient Prescriptions  Medication Sig Dispense Refill  .  ACCU-CHEK AVIVA test strip Check twice daily      . Calcium Carbonate-Vitamin D (CALCIUM 600-D) 600-400 MG-UNIT per tablet Take 1 tablet by mouth daily.        . Cholecalciferol (VITAMIN D) 2000 UNITS CAPS Take 2,000 Units by mouth daily.        . CRESTOR 5 MG tablet TAKE 1 TABLET BY MOUTH DAILY  90 tablet  1  . furosemide (LASIX) 20 MG tablet Take 20 mg by mouth daily.        . insulin lispro protamine-insulin lispro (HUMALOG 75/25) (75-25) 100 UNIT/ML SUSP Inject 12 Units into the skin 2 (two) times daily with a meal. Sliding Scales      . Lite Touch Lancets MISC Use as directed      . warfarin (COUMADIN) 5 MG tablet Take 1 tablet (5 mg total) by mouth as directed.  40 tablet  3    Allergies  Allergen Reactions  . Codeine     Vomiting     Review of Systems negative except from HPI and PMH BP 136/74  Pulse 68  Ht 4\' 11"  (1.499 m)  Wt 130 lb 1.9 oz (59.022 kg)  BMI 26.28 kg/m2  Physical Exam Well developed and well nourished in no acute distress HENT normal E scleral and icterus clear Neck Supple JVP 7-8 cm; carotids brisk  and full Clear to ausculation irregularly irregular soft with active bowel sounds No clubbing cyanosis 1+ Edema Alert and oriented, grossly normal motor and sensory function Skin Warm and Dry  Holter as above  Assessment and  Plan

## 2011-08-14 NOTE — Assessment & Plan Note (Signed)
No significant bradycardia was noted on Holter monitor. We will resume the atenolol.

## 2011-08-14 NOTE — Assessment & Plan Note (Signed)
She improved with low-dose diuretics. There has a reaccumulation of fluid with the decrease dose of the diuretic. We will resume her at 40 mg a day.  She is to see her endocrinologist in 10 days' time. She'll have her metabolic profile reassessed at that time. In the event that her potassium is adequate, I will defer to his expertise the role of an ACE inhibitor.

## 2011-08-24 ENCOUNTER — Encounter: Payer: Self-pay | Admitting: Internal Medicine

## 2011-08-24 LAB — BASIC METABOLIC PANEL
BUN: 26 mg/dL — AB (ref 4–21)
Creatinine: 1.2 mg/dL — AB (ref 0.5–1.1)
Glucose: 56 mg/dL
Potassium: 4.1 mmol/L (ref 3.4–5.3)
Sodium: 141 mmol/L (ref 137–147)

## 2011-08-25 ENCOUNTER — Encounter: Payer: Self-pay | Admitting: Internal Medicine

## 2011-08-31 LAB — PROTIME-INR: INR: 2.5 — AB (ref 0.9–1.1)

## 2011-09-01 ENCOUNTER — Ambulatory Visit (INDEPENDENT_AMBULATORY_CARE_PROVIDER_SITE_OTHER): Payer: Self-pay | Admitting: Cardiology

## 2011-09-01 DIAGNOSIS — I4891 Unspecified atrial fibrillation: Secondary | ICD-10-CM

## 2011-09-01 DIAGNOSIS — G459 Transient cerebral ischemic attack, unspecified: Secondary | ICD-10-CM

## 2011-09-01 DIAGNOSIS — Z7901 Long term (current) use of anticoagulants: Secondary | ICD-10-CM

## 2011-09-01 DIAGNOSIS — R0989 Other specified symptoms and signs involving the circulatory and respiratory systems: Secondary | ICD-10-CM

## 2011-10-15 ENCOUNTER — Ambulatory Visit (INDEPENDENT_AMBULATORY_CARE_PROVIDER_SITE_OTHER): Payer: Medicare Other | Admitting: Pharmacist

## 2011-10-15 DIAGNOSIS — I4891 Unspecified atrial fibrillation: Secondary | ICD-10-CM

## 2011-10-15 DIAGNOSIS — Z7901 Long term (current) use of anticoagulants: Secondary | ICD-10-CM

## 2011-10-15 DIAGNOSIS — G459 Transient cerebral ischemic attack, unspecified: Secondary | ICD-10-CM

## 2011-10-15 LAB — POCT INR: INR: 3.3

## 2011-10-19 ENCOUNTER — Encounter (INDEPENDENT_AMBULATORY_CARE_PROVIDER_SITE_OTHER): Payer: Self-pay | Admitting: Surgery

## 2011-11-05 ENCOUNTER — Other Ambulatory Visit: Payer: Self-pay | Admitting: Internal Medicine

## 2011-11-12 ENCOUNTER — Ambulatory Visit (INDEPENDENT_AMBULATORY_CARE_PROVIDER_SITE_OTHER): Payer: Medicare Other | Admitting: *Deleted

## 2011-11-12 DIAGNOSIS — G459 Transient cerebral ischemic attack, unspecified: Secondary | ICD-10-CM

## 2011-11-12 DIAGNOSIS — I4891 Unspecified atrial fibrillation: Secondary | ICD-10-CM

## 2011-11-12 DIAGNOSIS — Z7901 Long term (current) use of anticoagulants: Secondary | ICD-10-CM

## 2011-11-12 LAB — POCT INR: INR: 3

## 2011-12-10 ENCOUNTER — Ambulatory Visit (INDEPENDENT_AMBULATORY_CARE_PROVIDER_SITE_OTHER): Payer: Medicare Other

## 2011-12-10 DIAGNOSIS — G459 Transient cerebral ischemic attack, unspecified: Secondary | ICD-10-CM

## 2011-12-10 DIAGNOSIS — I4891 Unspecified atrial fibrillation: Secondary | ICD-10-CM

## 2011-12-10 DIAGNOSIS — Z7901 Long term (current) use of anticoagulants: Secondary | ICD-10-CM

## 2011-12-10 LAB — POCT INR: INR: 3.5

## 2011-12-21 ENCOUNTER — Ambulatory Visit (INDEPENDENT_AMBULATORY_CARE_PROVIDER_SITE_OTHER): Payer: Medicare Other | Admitting: Internal Medicine

## 2011-12-21 ENCOUNTER — Encounter: Payer: Self-pay | Admitting: Internal Medicine

## 2011-12-21 VITALS — BP 118/80 | HR 67 | Temp 97.5°F | Wt 128.5 lb

## 2011-12-21 DIAGNOSIS — I10 Essential (primary) hypertension: Secondary | ICD-10-CM

## 2011-12-21 DIAGNOSIS — I4891 Unspecified atrial fibrillation: Secondary | ICD-10-CM

## 2011-12-21 DIAGNOSIS — E119 Type 2 diabetes mellitus without complications: Secondary | ICD-10-CM

## 2011-12-21 DIAGNOSIS — Z794 Long term (current) use of insulin: Secondary | ICD-10-CM

## 2011-12-21 DIAGNOSIS — K5900 Constipation, unspecified: Secondary | ICD-10-CM

## 2011-12-21 NOTE — Progress Notes (Signed)
Subjective:    Patient ID: Wendy Hopkins, female    DOB: 1926-06-10, 78 y.o.   MRN: [id number]  HPI  Here for follow up - reviewed chronic medical issues today.  HTN - reports compliance with medications, no adv SE reports; no recent med changes; no chest pain , edema or headache .  Dyslipidemia - on crestor- no change in meds or dose, reports compliance with med as prescribed. No adverse side effects noted.  Afib - on chronic anticoag, follows with [facility] and Dr. Graciela Husbands; hx TIA assoc with same - chronic mild dyspnea on exertion, mild and unchanged. Per family, symptoms have been attributed to mild bradycardia.  previously offered pacemaker for treatment of same but declined.   DM2 - insulin dep - on 70/30 and follows with endo ([facility]) for same - cbg labile -range 150-250s - denies hypoglycemia symptoms  Past Medical History  Diagnosis Date  . Arthritis   . Diabetes mellitus   . Hyperlipidemia   . 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 Closed fracture of unspecified part of femur   . Unspecified transient cerebral ischemia   . Edema   . Chronic diastolic heart failure   . Osteoporosis with fracture     compression fx lower T spine and t4  . Thyroid nodule     dominate R nodule, s/p aspiration>abn path>6 mo obs planned ([facility] 06/01/11)  . GERD (gastroesophageal reflux disease)   . Hypertension   . Atrial fibrillation     chronic anticoag    Review of Systems  Constitutional: Negative for fatigue and unexpected weight change.  Respiratory: Positive for shortness of breath (chronic, mild.). Negative for chest tightness and wheezing.   Cardiovascular: Negative for chest pain and palpitations.  Gastrointestinal: Positive for constipation (chronic). Negative for abdominal pain, diarrhea and blood in stool.  Neurological: Negative for headaches.      Objective:    Physical Exam BP 118/80  Pulse 67  Temp(Src) 97.5 F (36.4 C) (Oral)  Wt 128 lb 8 oz  (58.287 kg)  SpO2 99% Wt Readings from Last 3 Encounters:  12/21/11 128 lb 8 oz (58.287 kg)  08/14/11 130 lb 1.9 oz (59.022 kg)  07/24/11 132 lb 12.8 oz (60.238 kg)   Constitutional: She appears well-developed and well-nourished. No distress. dtr and spouse at side Neck: Normal range of motion. Neck supple. No JVD present.   Cardiovascular: Slightly bradycardic rate, irregular rhythm and normal heart sounds.  No murmur heard. No BLE edema. Pulmonary/Chest: Effort normal and breath sounds normal. No respiratory distress. She has no wheezes.  Abdomen: protuberant, firm but +BS, NTND - no masses Neurological: She is alert and oriented to person, place, and time. No cranial nerve deficit. Coordination normal.  Psychiatric: She has a normal mood and affect. Her behavior is normal. Judgment and thought content normal.   Lab Results  Component Value Date   WBC 5.3 08/01/2010   HGB 11.7 08/01/2010   HCT 37.6 08/01/2010   PLT 203 08/01/2010   GLUCOSE 288* 07/24/2011   CHOL 135 10/28/2010   TRIG 79.0 10/28/2010   HDL 58.40 10/28/2010   LDLCALC 61 10/28/2010   ALT 22 08/24/2011   AST 27 08/24/2011   NA 141 08/24/2011   K 4.1 08/24/2011   CL 101 07/24/2011   CREATININE 1.2* 08/24/2011   BUN 26* 08/24/2011   CO2 28 07/24/2011   TSH 1.98 08/24/2011   INR 3.5 12/10/2011   HGBA1C 10.2* 08/24/2011  Assessment & Plan:  See problem list. Medications and labs reviewed today.  Constipation - advised on Benefiber and Miralax use with adequate water/fluids  Time spent with pt/family today 25 minutes, greater than 50% time spent counseling patient on diabetes, constipation, AF and medication review. Also review of interval records

## 2011-12-21 NOTE — Patient Instructions (Signed)
It was good to see you today. We have reviewed your prior records including labs and tests today Medications reviewed, no changes at this time. Keep working with Dr. Sharl Ma as ongoing Please schedule followup in 6 months, call sooner if problems.

## 2011-12-21 NOTE — Assessment & Plan Note (Signed)
Chronic issue: complicated by tachy-brady on beta-blocker  prev offered but declined PPM ongoing follow up with cards chronic anticoag at [facility] 

## 2011-12-21 NOTE — Assessment & Plan Note (Signed)
Follows with endo ([facility]) for same - on 70/30 in place of prior SSI uncontrolled by hx - but reports improving Lab Results  Component Value Date   HGBA1C 10.2* 08/24/2011

## 2011-12-21 NOTE — Assessment & Plan Note (Signed)
BP Readings from Last 3 Encounters:  12/21/11 118/80  08/14/11 136/74  07/24/11 159/70   The current medical regimen is effective;  continue present plan and medications.

## 2011-12-24 ENCOUNTER — Other Ambulatory Visit: Payer: Self-pay | Admitting: Internal Medicine

## 2011-12-24 MED ORDER — WARFARIN SODIUM 5 MG PO TABS: ORAL_TABLET | ORAL | Status: DC

## 2011-12-24 NOTE — Telephone Encounter (Signed)
Faxed into pharm  

## 2011-12-31 ENCOUNTER — Ambulatory Visit (INDEPENDENT_AMBULATORY_CARE_PROVIDER_SITE_OTHER): Payer: Medicare Other | Admitting: Pharmacist

## 2011-12-31 DIAGNOSIS — G459 Transient cerebral ischemic attack, unspecified: Secondary | ICD-10-CM

## 2011-12-31 DIAGNOSIS — I4891 Unspecified atrial fibrillation: Secondary | ICD-10-CM

## 2011-12-31 DIAGNOSIS — Z7901 Long term (current) use of anticoagulants: Secondary | ICD-10-CM

## 2011-12-31 LAB — POCT INR: INR: 2.1

## 2012-01-22 ENCOUNTER — Ambulatory Visit: Payer: Self-pay | Admitting: Cardiology

## 2012-01-22 DIAGNOSIS — I4891 Unspecified atrial fibrillation: Secondary | ICD-10-CM

## 2012-01-22 DIAGNOSIS — Z7901 Long term (current) use of anticoagulants: Secondary | ICD-10-CM

## 2012-01-22 DIAGNOSIS — G459 Transient cerebral ischemic attack, unspecified: Secondary | ICD-10-CM

## 2012-02-19 ENCOUNTER — Ambulatory Visit (INDEPENDENT_AMBULATORY_CARE_PROVIDER_SITE_OTHER): Payer: Medicare Other | Admitting: Pharmacist

## 2012-02-19 DIAGNOSIS — I4891 Unspecified atrial fibrillation: Secondary | ICD-10-CM

## 2012-02-19 DIAGNOSIS — Z7901 Long term (current) use of anticoagulants: Secondary | ICD-10-CM

## 2012-02-19 DIAGNOSIS — G459 Transient cerebral ischemic attack, unspecified: Secondary | ICD-10-CM

## 2012-02-19 LAB — POCT INR: INR: 3.2

## 2012-02-26 ENCOUNTER — Encounter: Payer: Self-pay | Admitting: Internal Medicine

## 2012-02-26 ENCOUNTER — Ambulatory Visit (INDEPENDENT_AMBULATORY_CARE_PROVIDER_SITE_OTHER): Payer: Medicare Other | Admitting: Internal Medicine

## 2012-02-26 VITALS — BP 157/82 | HR 58 | Ht 66.0 in | Wt 125.0 lb

## 2012-02-26 DIAGNOSIS — I5032 Chronic diastolic (congestive) heart failure: Secondary | ICD-10-CM

## 2012-02-26 DIAGNOSIS — I498 Other specified cardiac arrhythmias: Secondary | ICD-10-CM

## 2012-02-26 DIAGNOSIS — I4891 Unspecified atrial fibrillation: Secondary | ICD-10-CM

## 2012-02-26 LAB — BASIC METABOLIC PANEL
Calcium: 9 mg/dL (ref 8.4–10.5)
GFR: 54.92 mL/min — ABNORMAL LOW (ref >60.00)
Glucose, Bld: 223 mg/dL — ABNORMAL HIGH (ref 70–99)
Sodium: 135 mEq/L (ref 135–145)

## 2012-02-26 NOTE — Assessment & Plan Note (Signed)
There may be a prodrome incompetence component to this. We will decrease her atenolol

## 2012-02-26 NOTE — Assessment & Plan Note (Signed)
He has evidence of volume overload. We'll increase her Lasix from 40-60 daily. We'll check a metabolic profile today and in in 3 weeks.

## 2012-02-26 NOTE — Assessment & Plan Note (Signed)
Her atrial fibrillation is presumed to be permanent. She will continue anticoagulation. Her rates are somewhat slow and so we will decrease her atenolol from 25-12.5

## 2012-02-26 NOTE — Progress Notes (Signed)
  HPI  Wendy Hopkins is a 78 y.o. female  Seen in followup for atrial fibrillation with a CHADS score of 5-6 with a prior TIA/stroke  She was seen in December because of shortness of breath that was thought to be in part related to volume overload as well as bradycardia. Her beta blockers were discontinued and a diuretic initiated he'll also found to have poorly controlled ventricular response of her presumed to be permanent atrial fibrillation;  Her atenolol was resumed  She continues to complain of exercise associated shortness of breath. She's had some peripheral edema. She takes a diuretic. She has not had nocturnal dyspnea. She has had no significant chest pain     Past Medical History  Diagnosis Date  . Arthritis   . Diabetes mellitus, type II, insulin dependent   . Hyperlipidemia   . 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 Closed fracture of unspecified part of femur   . TIA (transient ischemic attack)   . Edema   . Chronic diastolic heart failure   . Osteoporosis with fracture     compression fx lower T spine and t4  . Thyroid nodule     dominate R nodule, s/p aspiration>abn path>6 mo obs planned ([facility] 06/01/11)  . GERD (gastroesophageal reflux disease)   . Hypertension   . Atrial fibrillation     chronic anticoag    Past Surgical History  Procedure Date  . Right femur 06/25/07    Intramedullary open reduction internal fixation  . Vitrectomy 2007    right eye with cataract repair - dr. Gwendalyn Ege  . Tonsillectomy     Current Outpatient Prescriptions  Medication Sig Dispense Refill  . ACCU-CHEK AVIVA test strip Check twice daily      . ACTONEL 150 MG tablet       . atenolol (TENORMIN) 25 MG tablet Take 1 tablet (25 mg total) by mouth daily.  30 tablet  6  . Cholecalciferol (VITAMIN D) 2000 UNITS CAPS Take 2,000 Units by mouth daily.        . CRESTOR 5 MG tablet TAKE 1 TABLET BY MOUTH DAILY  90 tablet  1  . furosemide (LASIX) 40 MG tablet  Take 1 tablet (40 mg total) by mouth daily.  30 tablet  6  . insulin lispro protamine-insulin lispro (HUMALOG 75/25) (75-25) 100 UNIT/ML SUSP Inject 12 Units into the skin 2 (two) times daily with a meal. Sliding Scales      . Lite Touch Lancets MISC Use as directed      . warfarin (COUMADIN) 5 MG tablet Take as directed by anticoagulation clinic  40 tablet  3    Allergies  Allergen Reactions  . Codeine     Vomiting     Review of Systems negative except from HPI and PMH  Physical Exam BP 157/82  Pulse 58  Ht 5\' 6"  (1.676 m)  Wt 125 lb (56.7 kg)  BMI 20.18 kg/m2  Brought her heart rate increased to about 75 Well developed and well nourished in no acute distress HENT normal E scleral and icterus clear Neck Supple JVP flat; carotids brisk and full Clear to ausculation irregularly irregular Slow  rate and rhythm, no murmurs gallops or rub Soft with active bowel sounds No clubbing cyanosis 1+ Edema Alert and oriented, grossly normal motor and sensory function Skin Warm and Dry  ECG demonstrates atrial fibrillation at 58 Intervals-/08/41 Assessment and  Plan

## 2012-02-26 NOTE — Patient Instructions (Signed)
Your physician has recommended you make the following change in your medication: 1) Decrease atenolol to 25 mg 1/2 tablet by mouth daily. 2) Increase lasix (furosemide) to 40 mg 1 & 1/2 tablets by mouth daily.  Your physician recommends that you have lab work : today- bmp & in 3 weeks- bmp  Your physician wants you to follow-up in: 6 months with Dr. Graciela Husbands. You will receive a reminder letter in the mail two months in advance. If you don't receive a letter, please call our office to schedule the follow-up appointment.

## 2012-03-04 ENCOUNTER — Other Ambulatory Visit: Payer: Self-pay | Admitting: Internal Medicine

## 2012-03-04 LAB — HEMOGLOBIN A1C: Hgb A1c MFr Bld: 9 % — AB (ref 4.0–6.0)

## 2012-03-14 ENCOUNTER — Ambulatory Visit (INDEPENDENT_AMBULATORY_CARE_PROVIDER_SITE_OTHER): Payer: Medicare Other | Admitting: *Deleted

## 2012-03-14 ENCOUNTER — Other Ambulatory Visit (INDEPENDENT_AMBULATORY_CARE_PROVIDER_SITE_OTHER): Payer: Medicare Other

## 2012-03-14 DIAGNOSIS — I5032 Chronic diastolic (congestive) heart failure: Secondary | ICD-10-CM

## 2012-03-14 DIAGNOSIS — I4891 Unspecified atrial fibrillation: Secondary | ICD-10-CM

## 2012-03-14 DIAGNOSIS — G459 Transient cerebral ischemic attack, unspecified: Secondary | ICD-10-CM

## 2012-03-14 DIAGNOSIS — Z7901 Long term (current) use of anticoagulants: Secondary | ICD-10-CM

## 2012-03-14 DIAGNOSIS — I498 Other specified cardiac arrhythmias: Secondary | ICD-10-CM

## 2012-03-14 LAB — BASIC METABOLIC PANEL
BUN: 14 mg/dL (ref 6–23)
GFR: 51.96 mL/min — ABNORMAL LOW (ref >60.00)
Potassium: 4.5 mEq/L (ref 3.5–5.1)

## 2012-03-14 LAB — POCT INR: INR: 3.1

## 2012-03-18 ENCOUNTER — Other Ambulatory Visit: Payer: Self-pay | Admitting: Cardiology

## 2012-03-18 DIAGNOSIS — I5032 Chronic diastolic (congestive) heart failure: Secondary | ICD-10-CM

## 2012-03-18 DIAGNOSIS — I498 Other specified cardiac arrhythmias: Secondary | ICD-10-CM

## 2012-03-18 DIAGNOSIS — I4891 Unspecified atrial fibrillation: Secondary | ICD-10-CM

## 2012-03-18 MED ORDER — ATENOLOL 25 MG PO TABS: ORAL_TABLET | ORAL | Status: DC

## 2012-03-28 ENCOUNTER — Ambulatory Visit (INDEPENDENT_AMBULATORY_CARE_PROVIDER_SITE_OTHER): Payer: Medicare Other | Admitting: *Deleted

## 2012-03-28 DIAGNOSIS — Z7901 Long term (current) use of anticoagulants: Secondary | ICD-10-CM

## 2012-03-28 DIAGNOSIS — I4891 Unspecified atrial fibrillation: Secondary | ICD-10-CM

## 2012-03-28 DIAGNOSIS — G459 Transient cerebral ischemic attack, unspecified: Secondary | ICD-10-CM

## 2012-03-28 LAB — POCT INR: INR: 2.5

## 2012-04-07 ENCOUNTER — Encounter: Payer: Self-pay | Admitting: *Deleted

## 2012-04-08 ENCOUNTER — Other Ambulatory Visit: Payer: Self-pay | Admitting: Internal Medicine

## 2012-04-08 NOTE — Telephone Encounter (Signed)
New problem  Please clarify direction & dosage of medication .

## 2012-04-12 ENCOUNTER — Encounter: Payer: Self-pay | Admitting: Internal Medicine

## 2012-04-18 ENCOUNTER — Ambulatory Visit (INDEPENDENT_AMBULATORY_CARE_PROVIDER_SITE_OTHER): Payer: Medicare Other | Admitting: *Deleted

## 2012-04-18 DIAGNOSIS — G459 Transient cerebral ischemic attack, unspecified: Secondary | ICD-10-CM

## 2012-04-18 DIAGNOSIS — Z7901 Long term (current) use of anticoagulants: Secondary | ICD-10-CM

## 2012-04-18 DIAGNOSIS — I4891 Unspecified atrial fibrillation: Secondary | ICD-10-CM

## 2012-04-18 LAB — POCT INR: INR: 3.2

## 2012-04-19 ENCOUNTER — Other Ambulatory Visit: Payer: Self-pay | Admitting: Cardiology

## 2012-04-19 DIAGNOSIS — I5032 Chronic diastolic (congestive) heart failure: Secondary | ICD-10-CM

## 2012-04-19 DIAGNOSIS — I498 Other specified cardiac arrhythmias: Secondary | ICD-10-CM

## 2012-04-19 DIAGNOSIS — I4891 Unspecified atrial fibrillation: Secondary | ICD-10-CM

## 2012-04-19 MED ORDER — FUROSEMIDE 40 MG PO TABS: ORAL_TABLET | ORAL | Status: DC

## 2012-04-22 ENCOUNTER — Other Ambulatory Visit: Payer: Self-pay | Admitting: *Deleted

## 2012-04-22 MED ORDER — ROSUVASTATIN CALCIUM 5 MG PO TABS: ORAL_TABLET | ORAL | Status: DC

## 2012-04-22 NOTE — Telephone Encounter (Signed)
R'cd fax from [facility] for refill of Crestor.  

## 2012-05-05 ENCOUNTER — Ambulatory Visit (INDEPENDENT_AMBULATORY_CARE_PROVIDER_SITE_OTHER): Payer: Medicare Other | Admitting: *Deleted

## 2012-05-05 DIAGNOSIS — I4891 Unspecified atrial fibrillation: Secondary | ICD-10-CM

## 2012-05-05 DIAGNOSIS — G459 Transient cerebral ischemic attack, unspecified: Secondary | ICD-10-CM

## 2012-05-05 DIAGNOSIS — Z7901 Long term (current) use of anticoagulants: Secondary | ICD-10-CM

## 2012-05-05 LAB — POCT INR: INR: 1.9

## 2012-05-20 ENCOUNTER — Ambulatory Visit (INDEPENDENT_AMBULATORY_CARE_PROVIDER_SITE_OTHER): Payer: Medicare Other | Admitting: General Practice

## 2012-05-20 DIAGNOSIS — I4891 Unspecified atrial fibrillation: Secondary | ICD-10-CM

## 2012-05-20 DIAGNOSIS — Z7901 Long term (current) use of anticoagulants: Secondary | ICD-10-CM

## 2012-05-20 DIAGNOSIS — G459 Transient cerebral ischemic attack, unspecified: Secondary | ICD-10-CM

## 2012-06-10 ENCOUNTER — Ambulatory Visit (INDEPENDENT_AMBULATORY_CARE_PROVIDER_SITE_OTHER): Payer: Medicare Other | Admitting: General Practice

## 2012-06-10 DIAGNOSIS — Z7901 Long term (current) use of anticoagulants: Secondary | ICD-10-CM

## 2012-06-10 DIAGNOSIS — I4891 Unspecified atrial fibrillation: Secondary | ICD-10-CM

## 2012-06-10 DIAGNOSIS — G459 Transient cerebral ischemic attack, unspecified: Secondary | ICD-10-CM

## 2012-06-15 ENCOUNTER — Other Ambulatory Visit: Payer: Self-pay | Admitting: General Practice

## 2012-06-15 ENCOUNTER — Other Ambulatory Visit: Payer: Self-pay | Admitting: Internal Medicine

## 2012-06-15 MED ORDER — WARFARIN SODIUM 5 MG PO TABS: ORAL_TABLET | ORAL | Status: DC

## 2012-06-20 ENCOUNTER — Ambulatory Visit (INDEPENDENT_AMBULATORY_CARE_PROVIDER_SITE_OTHER): Payer: Medicare Other | Admitting: Internal Medicine

## 2012-06-20 ENCOUNTER — Encounter: Payer: Self-pay | Admitting: Internal Medicine

## 2012-06-20 ENCOUNTER — Other Ambulatory Visit (INDEPENDENT_AMBULATORY_CARE_PROVIDER_SITE_OTHER): Payer: Medicare Other

## 2012-06-20 VITALS — BP 112/62 | HR 58 | Temp 96.8°F | Ht 66.0 in | Wt 133.2 lb

## 2012-06-20 DIAGNOSIS — R300 Dysuria: Secondary | ICD-10-CM

## 2012-06-20 DIAGNOSIS — I10 Essential (primary) hypertension: Secondary | ICD-10-CM

## 2012-06-20 DIAGNOSIS — Z794 Long term (current) use of insulin: Secondary | ICD-10-CM

## 2012-06-20 DIAGNOSIS — E785 Hyperlipidemia, unspecified: Secondary | ICD-10-CM

## 2012-06-20 DIAGNOSIS — E119 Type 2 diabetes mellitus without complications: Secondary | ICD-10-CM

## 2012-06-20 LAB — URINALYSIS, ROUTINE W REFLEX MICROSCOPIC
Bilirubin Urine: NEGATIVE
Ketones, ur: NEGATIVE
Leukocytes, UA: NEGATIVE
Specific Gravity, Urine: <=1.005 (ref 1.000–1.030)
Total Protein, Urine: NEGATIVE
Urine Glucose: NEGATIVE
pH: 7 (ref 5.0–8.0)

## 2012-06-20 LAB — LIPID PANEL
Cholesterol: 135 mg/dL (ref 0–200)
HDL: 59.9 mg/dL (ref >39.00)
Triglycerides: 86 mg/dL (ref 0.0–149.0)

## 2012-06-20 NOTE — Patient Instructions (Signed)
It was good to see you today. We have reviewed your prior records including labs and tests today Medications reviewed and updated, no changes at this time. Test(s) ordered today. Your results will be released to MyChart (or called to you) after review, usually within 72hours after test completion. If any changes need to be made, you will be notified at that same time. Please schedule followup in 6 months, call sooner if problems.

## 2012-06-20 NOTE — Assessment & Plan Note (Signed)
BP Readings from Last 3 Encounters:  06/20/12 112/62  02/26/12 157/82  12/21/11 118/80   The current medical regimen is effective;  continue present plan and medications.

## 2012-06-20 NOTE — Assessment & Plan Note (Signed)
On crestor - last lipids reviewed The current medical regimen is effective;  continue present plan and medications. Check annually  

## 2012-06-20 NOTE — Progress Notes (Signed)
Subjective:    Patient ID: LAURENA VALKO, female    DOB: 1926-04-03, 78 y.o.   MRN: [id number]  HPI  Here for follow up - reviewed chronic medical issues today.  HTN - reports compliance with medications, no adv SE reports; no recent med changes; no chest pain, edema or headache .  Dyslipidemia - on crestor- no change in meds or dose, reports compliance with med as prescribed. No adverse side effects noted.  Afib - on chronic anticoag, follows with [facility] and Dr. Graciela Husbands; hx TIA assoc with same - chronic mild dyspnea on exertion, mild and unchanged. Per family, symptoms have been attributed to mild bradycardia.  previously offered pacemaker for treatment of same but declined.   DM2 - insulin dep - on 70/30 and follows with endo (kerr) for same - cbg labile -range 150-250s - denies hypoglycemia symptoms  Past Medical History  Diagnosis Date  . Arthritis   . Diabetes mellitus, type II, insulin dependent   . Hyperlipidemia   . 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 Closed fracture of unspecified part of femur 2008  . TIA (transient ischemic attack)   . Edema   . Chronic diastolic heart failure   . Osteoporosis with fracture     compression fx lower T spine and t4  . Thyroid nodule     dominate R nodule, s/p aspiration>abn path>6 mo obs planned ([facility] 06/01/11)  . GERD (gastroesophageal reflux disease)   . Hypertension   . Atrial fibrillation     chronic anticoag    Review of Systems  Constitutional: Negative for fatigue and unexpected weight change.  Respiratory: Positive for shortness of breath (chronic, mild). Negative for chest tightness and wheezing.   Cardiovascular: Negative for chest pain and palpitations.  Gastrointestinal: Positive for constipation (chronic). Negative for abdominal pain, diarrhea and blood in stool.  Genitourinary: Positive for dysuria.  Neurological: Negative for headaches.      Objective:    Physical Exam BP 112/62  Pulse  58  Temp 96.8 F (36 C) (Oral)  Ht 5\' 6"  (1.676 m)  Wt 133 lb 3.2 oz (60.419 kg)  BMI 21.50 kg/m2  SpO2 98% Wt Readings from Last 3 Encounters:  06/20/12 133 lb 3.2 oz (60.419 kg)  02/26/12 125 lb (56.7 kg)  12/21/11 128 lb 8 oz (58.287 kg)   Constitutional: She appears well-developed and well-nourished. No distress. dtr and spouse at side Neck: Normal range of motion. Neck supple. No JVD present.   Cardiovascular: Slightly bradycardic rate, irregular rhythm and normal heart sounds.  No murmur heard. No BLE edema. Pulmonary/Chest: Effort normal and breath sounds normal. No respiratory distress. She has no wheezes.  Neurological: She is alert and oriented to person, place, and time. No cranial nerve deficit. Coordination normal.  Psychiatric: She has a normal mood and affect. Her behavior is normal. Judgment and thought content normal.   Lab Results  Component Value Date   WBC 5.3 08/01/2010   HGB 11.7 08/01/2010   HCT 37.6 08/01/2010   PLT 203 08/01/2010   GLUCOSE 223* 03/14/2012   CHOL 135 10/28/2010   TRIG 79.0 10/28/2010   HDL 58.40 10/28/2010   LDLCALC 61 10/28/2010   ALT 22 08/24/2011   AST 27 08/24/2011   NA 135 03/14/2012   K 4.5 03/14/2012   CL 100 03/14/2012   CREATININE 1.1 03/14/2012   BUN 14 03/14/2012   CO2 30 03/14/2012   TSH 1.98 08/24/2011   INR 1.9  06/10/2012   HGBA1C 9.0* 03/04/2012        Assessment & Plan:  See problem list. Medications and labs reviewed today.  Dysuria - check UA - tx if UTI on UA  Time spent with pt/family today 25 minutes, greater than 50% time spent counseling patient on diabetes, constipation, AF and medication review. Also review of interval records

## 2012-06-20 NOTE — Assessment & Plan Note (Signed)
Follows with endo (kerr) for same - on 70/30 in place of prior SSI uncontrolled by hx - but reports improving Lab Results  Component Value Date   HGBA1C 9.0* 03/04/2012

## 2012-07-01 ENCOUNTER — Ambulatory Visit (INDEPENDENT_AMBULATORY_CARE_PROVIDER_SITE_OTHER): Payer: Medicare Other | Admitting: General Practice

## 2012-07-01 DIAGNOSIS — G459 Transient cerebral ischemic attack, unspecified: Secondary | ICD-10-CM

## 2012-07-01 DIAGNOSIS — I4891 Unspecified atrial fibrillation: Secondary | ICD-10-CM

## 2012-07-01 DIAGNOSIS — Z7901 Long term (current) use of anticoagulants: Secondary | ICD-10-CM

## 2012-07-01 LAB — POCT INR: INR: 2

## 2012-07-29 ENCOUNTER — Ambulatory Visit (INDEPENDENT_AMBULATORY_CARE_PROVIDER_SITE_OTHER): Payer: Medicare Other | Admitting: General Practice

## 2012-07-29 DIAGNOSIS — Z7901 Long term (current) use of anticoagulants: Secondary | ICD-10-CM

## 2012-07-29 DIAGNOSIS — I4891 Unspecified atrial fibrillation: Secondary | ICD-10-CM

## 2012-07-29 DIAGNOSIS — G459 Transient cerebral ischemic attack, unspecified: Secondary | ICD-10-CM

## 2012-07-29 LAB — POCT INR: INR: 2.6

## 2012-07-30 ENCOUNTER — Emergency Department (HOSPITAL_COMMUNITY)
Admission: EM | Admit: 2012-07-30 | Discharge: 2012-07-30 | Disposition: A | Discharge status: Home / Self Care | Payer: Medicare Other | Attending: Emergency Medicine | Admitting: Emergency Medicine

## 2012-07-30 ENCOUNTER — Emergency Department (HOSPITAL_COMMUNITY): Payer: Medicare Other

## 2012-07-30 ENCOUNTER — Encounter (HOSPITAL_COMMUNITY): Payer: Self-pay | Admitting: Emergency Medicine

## 2012-07-30 DIAGNOSIS — Z7901 Long term (current) use of anticoagulants: Secondary | ICD-10-CM | POA: Insufficient documentation

## 2012-07-30 DIAGNOSIS — Z8719 Personal history of other diseases of the digestive system: Secondary | ICD-10-CM | POA: Insufficient documentation

## 2012-07-30 DIAGNOSIS — Z8679 Personal history of other diseases of the circulatory system: Secondary | ICD-10-CM | POA: Insufficient documentation

## 2012-07-30 DIAGNOSIS — I5032 Chronic diastolic (congestive) heart failure: Secondary | ICD-10-CM | POA: Insufficient documentation

## 2012-07-30 DIAGNOSIS — R109 Unspecified abdominal pain: Secondary | ICD-10-CM | POA: Insufficient documentation

## 2012-07-30 DIAGNOSIS — E785 Hyperlipidemia, unspecified: Secondary | ICD-10-CM | POA: Insufficient documentation

## 2012-07-30 DIAGNOSIS — Z794 Long term (current) use of insulin: Secondary | ICD-10-CM | POA: Insufficient documentation

## 2012-07-30 DIAGNOSIS — Z79899 Other long term (current) drug therapy: Secondary | ICD-10-CM | POA: Insufficient documentation

## 2012-07-30 DIAGNOSIS — Z862 Personal history of diseases of the blood and blood-forming organs and certain disorders involving the immune mechanism: Secondary | ICD-10-CM | POA: Insufficient documentation

## 2012-07-30 DIAGNOSIS — R112 Nausea with vomiting, unspecified: Secondary | ICD-10-CM | POA: Insufficient documentation

## 2012-07-30 DIAGNOSIS — I10 Essential (primary) hypertension: Secondary | ICD-10-CM | POA: Insufficient documentation

## 2012-07-30 DIAGNOSIS — Z8639 Personal history of other endocrine, nutritional and metabolic disease: Secondary | ICD-10-CM | POA: Insufficient documentation

## 2012-07-30 DIAGNOSIS — E119 Type 2 diabetes mellitus without complications: Secondary | ICD-10-CM | POA: Insufficient documentation

## 2012-07-30 DIAGNOSIS — Z8739 Personal history of other diseases of the musculoskeletal system and connective tissue: Secondary | ICD-10-CM | POA: Insufficient documentation

## 2012-07-30 DIAGNOSIS — Z8781 Personal history of (healed) traumatic fracture: Secondary | ICD-10-CM | POA: Insufficient documentation

## 2012-07-30 DIAGNOSIS — D500 Iron deficiency anemia secondary to blood loss (chronic): Secondary | ICD-10-CM | POA: Insufficient documentation

## 2012-07-30 DIAGNOSIS — Z8673 Personal history of transient ischemic attack (TIA), and cerebral infarction without residual deficits: Secondary | ICD-10-CM | POA: Insufficient documentation

## 2012-07-30 LAB — COMPREHENSIVE METABOLIC PANEL
Alkaline Phosphatase: 96 U/L (ref 39–117)
BUN: 19 mg/dL (ref 6–23)
Calcium: 8.8 mg/dL (ref 8.4–10.5)
Creatinine, Ser: 0.95 mg/dL (ref 0.50–1.10)
GFR calc Af Amer: 62 mL/min — ABNORMAL LOW (ref >90)
Glucose, Bld: 267 mg/dL — ABNORMAL HIGH (ref 70–99)
Total Protein: 7.1 g/dL (ref 6.0–8.3)

## 2012-07-30 LAB — PROTIME-INR: INR: 3.27 — ABNORMAL HIGH (ref 0.00–1.49)

## 2012-07-30 LAB — GLUCOSE, CAPILLARY: Glucose-Capillary: 230 mg/dL — ABNORMAL HIGH (ref 70–99)

## 2012-07-30 LAB — URINALYSIS, ROUTINE W REFLEX MICROSCOPIC
Ketones, ur: NEGATIVE mg/dL
Leukocytes, UA: NEGATIVE
Nitrite: NEGATIVE
Specific Gravity, Urine: 1.016 (ref 1.005–1.030)
pH: 7.5 (ref 5.0–8.0)

## 2012-07-30 LAB — CBC WITH DIFFERENTIAL/PLATELET
HCT: 38.5 % (ref 36.0–46.0)
Hemoglobin: 13.1 g/dL (ref 12.0–15.0)
Lymphocytes Relative: 8 % — ABNORMAL LOW (ref 12–46)
MCHC: 34 g/dL (ref 30.0–36.0)
Monocytes Absolute: 0.5 10*3/uL (ref 0.1–1.0)
Monocytes Relative: 5 % (ref 3–12)
Neutro Abs: 7.9 10*3/uL — ABNORMAL HIGH (ref 1.7–7.7)
WBC: 9.2 10*3/uL (ref 4.0–10.5)

## 2012-07-30 MED ORDER — HYDROCODONE-ACETAMINOPHEN 5-325 MG PO TABS: 1.0000 | ORAL_TABLET | ORAL | Status: DC | PRN

## 2012-07-30 MED ORDER — ONDANSETRON HCL 4 MG/2ML IJ SOLN
4.0000 mg | Freq: Once | INTRAMUSCULAR | Status: AC
  Administered 2012-07-30: 4 mg via INTRAVENOUS
  Filled 2012-07-30: qty 2

## 2012-07-30 MED ORDER — FENTANYL CITRATE 0.05 MG/ML IJ SOLN
50.0000 ug | Freq: Once | INTRAMUSCULAR | Status: AC
  Administered 2012-07-30: 50 ug via INTRAVENOUS
  Filled 2012-07-30 (×2): qty 2

## 2012-07-30 MED ORDER — SODIUM CHLORIDE 0.9 % IV SOLN
Freq: Once | INTRAVENOUS | Status: AC
  Administered 2012-07-30: 15:00:00 via INTRAVENOUS

## 2012-07-30 MED ORDER — IOHEXOL 300 MG/ML  SOLN
100.0000 mL | Freq: Once | INTRAMUSCULAR | Status: AC | PRN
  Administered 2012-07-30: 100 mL via INTRAVENOUS

## 2012-07-30 MED ORDER — ONDANSETRON 4 MG PO TBDP: ORAL_TABLET | ORAL | Status: DC

## 2012-07-30 NOTE — ED Notes (Signed)
After being wheeled chair to bathroom & obtained  Urine specimen started experiencing nausea relates it to moving around & or oral contrast.

## 2012-07-30 NOTE — ED Notes (Signed)
Pt states she was dusting yesterday afternoon and had sudden onset of right sided flank, which resolved and started on the left side. Denies any urinary tract Sx. Pt did not sleep d/t pain, w/ ambulation increases, nausea when up but if she sits down the nausea normally calms down. Emesis in car on way to ED but now resolved.

## 2012-07-30 NOTE — ED Notes (Signed)
Completed first cup of contrast.

## 2012-07-30 NOTE — ED Provider Notes (Signed)
History     CSN: [id number]  Arrival date & time 07/30/12  1424   First MD Initiated Contact with Patient 07/30/12 1504      Chief Complaint  Patient presents with  . Flank Pain    (Consider location/radiation/quality/duration/timing/severity/associated sxs/prior treatment) HPI Comments: Patient started with pain in the right side of the abdomen and flank that started yesterday afternoon.  It has worsened throughout the night and into today.  She denies any fevers or chills but does report some nausea and vomiting today.  No urinary complaints.    Patient is a 78 y.o. female presenting with abdominal pain. The history is provided by the patient.  Abdominal Pain The primary symptoms of the illness include abdominal pain, nausea and vomiting. The primary symptoms of the illness do not include fever, diarrhea or dysuria. The current episode started yesterday. The problem has not changed since onset. The patient has not had a change in bowel habit. Additional symptoms associated with the illness include constipation. Symptoms associated with the illness do not include chills.    Past Medical History  Diagnosis Date  . Arthritis   . Diabetes mellitus, type II, insulin dependent   . Hyperlipidemia   . 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 Closed fracture of unspecified part of femur 2008  . TIA (transient ischemic attack)   . Edema   . Chronic diastolic heart failure   . Osteoporosis with fracture     compression fx lower T spine and t4  . Thyroid nodule     dominate R nodule, s/p aspiration>abn path>6 mo obs planned ([facility] 06/01/11)  . GERD (gastroesophageal reflux disease)   . Hypertension   . Atrial fibrillation     chronic anticoag    Past Surgical History  Procedure Date  . Right femur 06/25/07    Intramedullary open reduction internal fixation  . Vitrectomy 2007    right eye with cataract repair - dr. Gwendalyn Ege  . Tonsillectomy     Family  History  Problem Relation Age of Onset  . Hypertension Father     Parent not sure which 1  . Diabetes Brother   . Lung cancer Other   . Cancer Sister   . Parkinsonism Sister     History  Substance Use Topics  . Smoking status: Never Smoker   . Smokeless tobacco: Never Used  . Alcohol Use: No    OB History    Grav Para Term Preterm Abortions TAB SAB Ect Mult Living                  Review of Systems  Constitutional: Negative for fever and chills.  Gastrointestinal: Positive for nausea, vomiting, abdominal pain and constipation. Negative for diarrhea.  Genitourinary: Negative for dysuria.  All other systems reviewed and are negative.    Allergies  Codeine  Home Medications   Current Outpatient Rx  Name  Route  Sig  Dispense  Refill  . ACCU-CHEK AVIVA VI STRP      Check twice daily         . ACTONEL 150 MG PO TABS               . ATENOLOL 25 MG PO TABS      Take 1/2 tablet by mouth once daily.   30 tablet   3   . VITAMIN D 2000 UNITS PO CAPS   Oral   Take 2,000 Units by mouth daily.           Marland Kitchen  FUROSEMIDE 40 MG PO TABS      Take 1 & 1/2 tablets by mouth once daily   45 tablet   6   . INSULIN GLARGINE 100 UNIT/ML [place] SOLN   Subcutaneous   Inject 9 Units into the skin daily.         . INSULIN LISPRO (HUMAN) 100 UNIT/ML  SOLN   Subcutaneous   Inject 3 Units into the skin 3 (three) times daily before meals.         . INSULIN SYRINGE 30G X 5/16" 1 ML MISC      Use four times a day         . FREESTYLE LANCETS MISC      Use three times a day to check blood sugar         . LITE TOUCH LANCETS MISC      Use as directed         . ROSUVASTATIN CALCIUM 5 MG PO TABS      TAKE 1 TABLET BY MOUTH DAILY   90 tablet   1   . WARFARIN SODIUM 5 MG PO TABS      Take as directed by anticoagulation clinic   40 tablet   3     30 day supply     BP 165/55  Pulse 63  Temp 98.3 F (36.8 C) (Oral)  Resp 14  SpO2 100%  Physical  Exam  Nursing note and vitals reviewed. Constitutional: She is oriented to person, place, and time. She appears well-developed and well-nourished. No distress.  HENT:  Head: Normocephalic and atraumatic.  Mouth/Throat: Oropharynx is clear and moist.  Neck: Normal range of motion. Neck supple.  Cardiovascular: Normal rate and regular rhythm.   No murmur heard. Pulmonary/Chest: Effort normal and breath sounds normal. She has no wheezes.  Abdominal: Soft. Bowel sounds are normal. She exhibits no distension.       There is ttp in the epigastric region and right flank.  There is no rebound or guarding.  Musculoskeletal: Normal range of motion. She exhibits no edema.  Lymphadenopathy:    She has no cervical adenopathy.  Neurological: She is alert and oriented to person, place, and time.  Skin: Skin is warm and dry. She is not diaphoretic.    ED Course  Procedures (including critical care time)   Labs Reviewed  CBC WITH DIFFERENTIAL  COMPREHENSIVE METABOLIC PANEL  URINALYSIS, ROUTINE W REFLEX MICROSCOPIC   No results found.   No diagnosis found.    MDM  The ct fails to reveal any intra-abdominal process, but does suggest something in the right base that I have advised her to follow up with her pcp about.  I doubt this has anything to do with today's symptoms.  The labs are reassuring with the exception of the sodium of 124.  She was given one liter of saline that should help to correct this.  She is feeling better with meds.  She will be discharged and is to follow up with her pcp and return if she worsens.          Geoffery Lyons, MD 07/30/12 [phone]

## 2012-07-30 NOTE — ED Notes (Signed)
CT scan called. Patient stated she can not drink anymore.

## 2012-07-30 NOTE — ED Notes (Signed)
Pt states she doesn't feel the urge to void at this time in order to provide a specimen for the lab. Pt instructed to let staff know when she is able to provide sample. Nurse notified.

## 2012-08-02 ENCOUNTER — Telehealth: Payer: Self-pay | Admitting: Internal Medicine

## 2012-08-02 NOTE — Telephone Encounter (Signed)
Patient Information:  Caller Name: Wendy Hopkins  Phone: [phone]  Patient: Wendy Hopkins, Wendy Hopkins  Gender: Female  DOB: 1925-11-03  Age: 78 Years  PCP: Rene Paci (Adults only)  Office Follow Up:  Does the office need to follow up with this patient?: Yes  Instructions For The Office: Son does not want to take her to ER. Frustrated.  RN Note:  He does not want to take her to the ER.  cold outside and car ride will be hard.  Son is frustrated there is no answer.  Symptoms  Reason For Call & Symptoms: Son states [name] was seen in the ER on Saturday 07/30/12 for abdominal and back pain. Diagnosed with Gastroenteritis. Abdominal pain uncomfortable but  Pain now has moved to her back.  Location in hips all the way across the back.  Reviewed Health History In EMR: Yes  Reviewed Medications In EMR: Yes  Reviewed Allergies In EMR: Yes  Reviewed Surgeries / Procedures: No  Date of Onset of Symptoms: 07/29/2012  Treatments Tried: Anti nausea medication/ hydrocodone/ Tylenol  Treatments Tried Worked: No  Guideline(s) Used:  Back Pain  Disposition Per Guideline:   See Today or Tomorrow in Office  Reason For Disposition Reached:   Age > 50 and no history of prior similar back pain  Advice Given:  Cold or Heat:  Heat Pack: If pain lasts over 2 days, apply heat to the sore area. Use a heat pack, heating pad, or warm wet washcloth. Do this for 10 minutes, then as needed. For widespread stiffness, take a hot bath or hot shower instead. Move the sore area under the warm water.  Sleep:  Sleep on your side with a pillow between your knees. If you sleep on your back, put a pillow under your knees.  Your mattress should be firm. Avoid waterbeds.  Activity  Keep doing your day-to-day activities if it is not too painful. Staying active is better than resting.  Avoid anything that makes your pain worse. Avoid heavy lifting, twisting, and too much exercise until your back heals.  You do not  need to stay in bed.  Call Back If:  Numbness or weakness occur  Bowel/bladder problems occur  Pain lasts for more than 2 weeks  You become worse.  Patient Refused Recommendation:  Patient Refused Care Advice  Does not want to go to the ER.

## 2012-08-04 ENCOUNTER — Encounter: Payer: Self-pay | Admitting: Internal Medicine

## 2012-08-04 ENCOUNTER — Other Ambulatory Visit: Payer: Self-pay | Admitting: *Deleted

## 2012-08-04 ENCOUNTER — Ambulatory Visit (INDEPENDENT_AMBULATORY_CARE_PROVIDER_SITE_OTHER): Payer: Medicare Other | Admitting: Internal Medicine

## 2012-08-04 ENCOUNTER — Ambulatory Visit (INDEPENDENT_AMBULATORY_CARE_PROVIDER_SITE_OTHER)
Admission: RE | Admit: 2012-08-04 | Discharge: 2012-08-04 | Disposition: A | Discharge status: Home / Self Care | Payer: Medicare Other | Source: Ambulatory Visit | Attending: Internal Medicine | Admitting: Internal Medicine

## 2012-08-04 VITALS — BP 148/80 | HR 68 | Temp 98.8°F | Ht 66.0 in

## 2012-08-04 DIAGNOSIS — IMO0002 Reserved for concepts with insufficient information to code with codable children: Secondary | ICD-10-CM

## 2012-08-04 DIAGNOSIS — R918 Other nonspecific abnormal finding of lung field: Secondary | ICD-10-CM | POA: Insufficient documentation

## 2012-08-04 DIAGNOSIS — T148XXA Other injury of unspecified body region, initial encounter: Secondary | ICD-10-CM

## 2012-08-04 DIAGNOSIS — R911 Solitary pulmonary nodule: Secondary | ICD-10-CM

## 2012-08-04 DIAGNOSIS — M549 Dorsalgia, unspecified: Secondary | ICD-10-CM

## 2012-08-04 MED ORDER — TRAMADOL HCL 50 MG PO TABS: 50.0000 mg | ORAL_TABLET | Freq: Three times a day (TID) | ORAL | Status: DC | PRN

## 2012-08-04 MED ORDER — ROSUVASTATIN CALCIUM 5 MG PO TABS: 5.0000 mg | ORAL_TABLET | Freq: Every day | ORAL | Status: DC

## 2012-08-04 MED ORDER — ONDANSETRON 4 MG PO TBDP: 4.0000 mg | ORAL_TABLET | Freq: Two times a day (BID) | ORAL | Status: DC | PRN

## 2012-08-04 NOTE — Patient Instructions (Signed)
It was good to see you today. We have reviewed your ER records including labs and tests today Test(s) ordered today. Your results will be released to MyChart (or called to you) after review, usually within 72hours after test completion. If any changes need to be made, you will be notified at that same time. we'll make referral to MRI back and then to specialist to consider kyphoplasty to treat compression fraction. Our office will contact you regarding appointment(s) once made.  Use tramadol as needed for pain with tylenol - Your prescription(s) have been submitted to your pharmacy. Please take as directed and contact our office if you believe you are having problem(s) with the medication(s).

## 2012-08-04 NOTE — Telephone Encounter (Signed)
Pt has Er follow-up this am...lmb

## 2012-08-04 NOTE — Progress Notes (Signed)
  Subjective:    Patient ID: Wendy Hopkins, female    DOB: [birth year], 78 y.o.   MRN: [id number]  HPI Here for ER follow up - seen 12/21 with R flank/abdominal pain - Pain now localized across back Pain severe, associated with nausea Pain exacerbated with ANY movement Pain not controlled with hydrocodone due to ed side effects (increase nausea) No bowel changes, fever, no shortness of breath   Also RLL nodule on CT a/p (incidental)  Past Medical History  Diagnosis Date  . Arthritis   . Diabetes mellitus, type II, insulin dependent   . Hyperlipidemia   . 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 Closed fracture of unspecified part of femur 2008  . TIA (transient ischemic attack)   . Edema   . Chronic diastolic heart failure   . Osteoporosis with fracture     compression fx lower T spine and t4  . Thyroid nodule     dominate R nodule, s/p aspiration>abn path>6 mo obs planned ([facility]kin 06/01/11)  . GERD (gastroesophageal reflux disease)   . Hypertension   . Atrial fibrillation     chronic anticoag    Review of Systems  Respiratory: Negative for cough and shortness of breath.   Cardiovascular: Negative for chest pain and palpitations.  Gastrointestinal: Positive for nausea. Negative for vomiting, diarrhea, constipation, blood in stool, abdominal distention and anal bleeding.  Musculoskeletal: Positive for back pain.       Objective:   Physical Exam BP 148/80  Pulse 68  Temp 98.8 F (37.1 C) (Oral)  Ht 5\' 6"  (1.676 m)  SpO2 98% Wt Readings from Last 3 Encounters:  06/20/12 133 lb 3.2 oz (60.419 kg)  02/26/12 125 lb (56.7 kg)  12/21/11 128 lb 8 oz (58.287 kg)   Constitutional: She appears very uncomfortable sitting in WC but well-developed and well-nourished. Dtr and son at side Neck: Normal range of motion. Neck supple. No JVD present. No thyromegaly present.  Cardiovascular: Normal rate, regular rhythm and normal heart sounds.  No murmur heard.  No BLE edema. Pulmonary/Chest: Effort normal and breath sounds normal. No respiratory distress. She has no wheezes.  Abdominal: Soft. Bowel sounds are normal. She exhibits no distension. There is no tenderness. no masses Musculoskeletal: tender to direct palp over lower t - upper L spine. No gross deformities Neurological: She is alert and oriented to person, place, and time. No cranial nerve deficit. Coordination normal.  Skin: Skin is warm and dry. No rash or bruise noted. No erythema.  Psychiatric: She has a normal mood and affect. Her behavior is normal. Judgment and thought content normal.   CT a/p 07/30/12: IMPRESSION:  No acute findings.  28 mm right lower lobe mass like opacity concerning for neoplasm  although this could reflect an area of infectious or inflammatory  consolidation.  Low density liver lesions are nonspecific. They may reflect cysts.  These can be further evaluated with a liver MRI for additional  characterization.  Renal cysts. Small low density spleen lesions which resolves on  the delayed images is likely a small meningioma but is nonspecific.  T12 and L3 compression fractures, likely old.       Assessment & Plan:   Acute back pain - hx compression fx - suspect same causing new pain now Check plain films and refer for MRI - If "new" compression fx on MRI, refer for kp/vp as needed Change hydrocodone to tramadol, continue tylenol as needed

## 2012-08-04 NOTE — Assessment & Plan Note (Signed)
RLL nodule, incidental finding on CT a/p 07/30/12 - pt/family aware of same -  Will need additional eval/testing once acute pain issues controlled -  Pt/family understand and agree

## 2012-08-05 ENCOUNTER — Telehealth: Payer: Self-pay | Admitting: *Deleted

## 2012-08-05 ENCOUNTER — Ambulatory Visit
Admission: RE | Admit: 2012-08-05 | Discharge: 2012-08-05 | Disposition: A | Discharge status: Home / Self Care | Payer: Medicare Other | Source: Ambulatory Visit | Attending: Internal Medicine | Admitting: Internal Medicine

## 2012-08-05 DIAGNOSIS — IMO0002 Reserved for concepts with insufficient information to code with codable children: Secondary | ICD-10-CM

## 2012-08-05 DIAGNOSIS — M549 Dorsalgia, unspecified: Secondary | ICD-10-CM

## 2012-08-05 DIAGNOSIS — S22080A Wedge compression fracture of T11-T12 vertebra, initial encounter for closed fracture: Secondary | ICD-10-CM

## 2012-08-05 NOTE — Telephone Encounter (Signed)
Daughter [facility]) gave md response...Raechel Chute

## 2012-08-05 NOTE — Telephone Encounter (Signed)
Wanted to let md know  Results back from MRI in epic pls review...Wendy Hopkins

## 2012-08-05 NOTE — Telephone Encounter (Signed)
Report reviewed - new compression fx T12 as suspected, confirmed on MRI -  Refer to specialist to consider KP/VP -order done - no change tx until then - please let family know same -  Also, please ask pt to start holding coumadin so that this will not delay scheduling the procedure (will need to be off for 5 days prior to procedure - I feel medical risk of holding coumadin is outweighed by benefit of timely procedure to treat extreme pain symptoms)

## 2012-08-05 NOTE — Telephone Encounter (Signed)
Called pt no answer LMOM RTC.../lmb 

## 2012-08-08 ENCOUNTER — Other Ambulatory Visit: Payer: Self-pay | Admitting: Internal Medicine

## 2012-08-08 DIAGNOSIS — IMO0002 Reserved for concepts with insufficient information to code with codable children: Secondary | ICD-10-CM

## 2012-08-09 ENCOUNTER — Telehealth: Payer: Self-pay | Admitting: General Practice

## 2012-08-09 ENCOUNTER — Telehealth: Payer: Self-pay | Admitting: Internal Medicine

## 2012-08-09 ENCOUNTER — Ambulatory Visit
Admission: RE | Admit: 2012-08-09 | Discharge: 2012-08-09 | Disposition: A | Discharge status: Home / Self Care | Payer: Medicare Other | Source: Ambulatory Visit | Attending: Internal Medicine | Admitting: Internal Medicine

## 2012-08-09 DIAGNOSIS — IMO0002 Reserved for concepts with insufficient information to code with codable children: Secondary | ICD-10-CM

## 2012-08-09 MED ORDER — ENOXAPARIN SODIUM 100 MG/ML ~~LOC~~ SOLN: 1.5000 mg/kg | Freq: Every day | SUBCUTANEOUS | Status: DC

## 2012-08-09 NOTE — Telephone Encounter (Signed)
Spoke with Wendy Hopkins at [place] office and told her I would address with Dr.McLean (DOD). Pt will need to take last dose of Coumadin on August 10, 2012. Does she need Lovenox bridging prior to procedure on January 6

## 2012-08-09 NOTE — Telephone Encounter (Signed)
Pt having surgery on 08-15-12 and needs surgical clearance, however klein out until 1-7, scott gone the rest of the week and lori is gone until Friday but booked, anything we can do?

## 2012-08-09 NOTE — Telephone Encounter (Signed)
Opened in error

## 2012-08-09 NOTE — Telephone Encounter (Signed)
Spoke with Wendy Hopkins at [facility]. Pt is scheduled for vertebroplasty on August 15, 2012 and call is regarding holding of Coumadin. They are not requesting surgical clearance.  I have spoken with Wendy Hopkins in coumadin clinic at [facility] office and she will send phone note to Dr. [name] Poche (office DOD) to address coumadin.

## 2012-08-09 NOTE — Telephone Encounter (Signed)
Returned Danielle's call from [facility] no answer.LMTC.

## 2012-08-09 NOTE — Telephone Encounter (Signed)
Addendum to note below.  Spoke with dtr Efraim Kaufmann) and gave instructions. 1/1 - last dose of coumadin 1/2 - Do not take coumadin or Lovenox 1/3 - Take Lovenox in the AM (No coumadin) 1/4 - Take Lovenox in the AM (No coumadin) 1/5 - Take Lovenox in the AM (No coumadin) 1/6 - Procedure (NO lovenox and NO coumadin) 1/7 - 7.5 mg coumadin and Lovenox in the AM 1/8 - 7.5 mg coumadin and Lovenox in the AM 1/9 - 5 mg coumadin and Lovenox in the AM 1/10 - 5 mg coumadin and Lovenox in the AM 1/11 - 5 mg coumadin and Lovenox in the AM 1/12 - 5 mg coumadin and Lovenox in the AM 1/13 - Re-check INR (Do not take coumadin or Lovenox until INR is checked)

## 2012-08-09 NOTE — Telephone Encounter (Signed)
Spoke with Arline Asp at Coumadin clinic at [facility] and gave her this information. She will contact pt with bridging instructions

## 2012-08-09 NOTE — Telephone Encounter (Signed)
Has history of stroke, would need Lovenox bridging.

## 2012-08-09 NOTE — Telephone Encounter (Signed)
Spoke with Duwayne Heck at [facility] and let her know pt would need lovenox bridging and coumadin clinic is contacting her today with instructions.

## 2012-08-09 NOTE — Telephone Encounter (Signed)
1/1 - last dose of coumadin 1/2 - Do not take coumadin or Lovenox 1/3 - Take Lovenox in the AM (No coumadin) 1/4 - Take Lovenox in the AM (No coumadin) 1/5 - Take Lovenox in the AM (No coumadin) 1/6 - Procedure 1/7 - Take 7.5 mg of coumadin and Lovenox in the AM 1/8 - Take 7.5 mg of coumadin and Lovenox in the AM 1/9 - Take 5 mg of coumadin and Lovenox in the AM 1/10 - Take 5 mg of coumadin and Lovenox in the AM

## 2012-08-11 ENCOUNTER — Other Ambulatory Visit: Payer: Self-pay | Admitting: Internal Medicine

## 2012-08-11 ENCOUNTER — Telehealth: Payer: Self-pay | Admitting: General Practice

## 2012-08-11 DIAGNOSIS — IMO0002 Reserved for concepts with insufficient information to code with codable children: Secondary | ICD-10-CM

## 2012-08-11 NOTE — Telephone Encounter (Signed)
Patient's daughter called to say that pt would in fact be able to get the Lovenox for the bridge.

## 2012-08-11 NOTE — Telephone Encounter (Signed)
Patient's dtr call to say that pt can't afford the Lovenox for the bridge.  I told her that I would direct that to Dr. Felicity Coyer and to Cardiology.

## 2012-08-12 ENCOUNTER — Ambulatory Visit (INDEPENDENT_AMBULATORY_CARE_PROVIDER_SITE_OTHER): Payer: Medicare Other | Admitting: Internal Medicine

## 2012-08-12 ENCOUNTER — Encounter: Payer: Self-pay | Admitting: Internal Medicine

## 2012-08-12 VITALS — BP 132/78 | HR 64 | Temp 99.2°F

## 2012-08-12 DIAGNOSIS — I4891 Unspecified atrial fibrillation: Secondary | ICD-10-CM

## 2012-08-12 DIAGNOSIS — J069 Acute upper respiratory infection, unspecified: Secondary | ICD-10-CM

## 2012-08-12 DIAGNOSIS — S22009A Unspecified fracture of unspecified thoracic vertebra, initial encounter for closed fracture: Secondary | ICD-10-CM

## 2012-08-12 DIAGNOSIS — S22000A Wedge compression fracture of unspecified thoracic vertebra, initial encounter for closed fracture: Secondary | ICD-10-CM

## 2012-08-12 MED ORDER — AZITHROMYCIN 250 MG PO TABS: ORAL_TABLET | ORAL | Status: DC

## 2012-08-12 MED ORDER — PREDNISONE (PAK) 10 MG PO TABS: 10.0000 mg | ORAL_TABLET | ORAL | Status: DC

## 2012-08-12 MED ORDER — GUAIFENESIN 100 MG/5ML PO LIQD: 200.0000 mg | Freq: Three times a day (TID) | ORAL | Status: DC | PRN

## 2012-08-12 MED ORDER — TRAMADOL HCL 50 MG PO TABS: 50.0000 mg | ORAL_TABLET | Freq: Four times a day (QID) | ORAL | Status: DC | PRN

## 2012-08-12 NOTE — Progress Notes (Signed)
  Subjective:    Patient ID: Wendy Hopkins, female    DOB: 1926/05/24, 79 y.o.   MRN: [id number]  HPI  complains of cough and chest congestion associated with low grade fever and runny nose/sore throat  planning KP on Mon for compression fx Coughing increases back pain Min sputum  Past Medical History  Diagnosis Date  . Arthritis   . Diabetes mellitus, type II, insulin dependent   . Hyperlipidemia   . 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 Closed fracture of unspecified part of femur 2008  . TIA (transient ischemic attack)   . Edema   . Chronic diastolic heart failure   . Osteoporosis with fracture     compression fx lower T spine and t4  . Thyroid nodule     dominate R nodule, s/p aspiration>abn path>6 mo obs planned ([facility] 06/01/11)  . GERD (gastroesophageal reflux disease)   . Hypertension   . Atrial fibrillation     chronic anticoag    Review of Systems  Constitutional: Positive for fever (LGF) and fatigue.  Respiratory: Positive for cough and wheezing. Negative for chest tightness and shortness of breath.   Cardiovascular: Negative for chest pain and leg swelling.  Musculoskeletal: Positive for back pain.       Objective:   Physical Exam BP 132/78  Pulse 64  Temp 99.2 F (37.3 C) (Oral)  SpO2 96% Constitutional: She appears uncomfortable, but well-developed and well-nourished. Dtr at side.  HENT: Head: Normocephalic and atraumatic. Ears: B TMs ok, no erythema or effusion; Nose: Nose normal. Mouth/Throat: Oropharynx is clear and moist. No oropharyngeal exudate. Neck: Normal range of motion. Neck supple. No JVD present. No thyromegaly present.  Cardiovascular: Normal rate, regular rhythm and normal heart sounds.  No murmur heard. No BLE edema. Pulmonary/Chest: Effort normal - few rhonchi breath sounds normal. No respiratory distress. She has no wheezes.  Psychiatric: She has a normal mood and affect. Her behavior is normal. Judgment and  thought content normal.   Lab Results  Component Value Date   WBC 9.2 07/30/2012   HGB 13.1 07/30/2012   HCT 38.5 07/30/2012   PLT 186 07/30/2012   GLUCOSE 267* 07/30/2012   CHOL 135 06/20/2012   TRIG 86.0 06/20/2012   HDL 59.90 06/20/2012   LDLCALC 58 06/20/2012   ALT 16 07/30/2012   AST 24 07/30/2012   NA 124* 07/30/2012   K 3.8 07/30/2012   CL 85* 07/30/2012   CREATININE 0.95 07/30/2012   BUN 19 07/30/2012   CO2 29 07/30/2012   TSH 1.98 08/24/2011   INR 3.27* 07/30/2012   HGBA1C 9.0* 03/04/2012        Assessment & Plan:   Viral URI with cough - empiric antibiotics rx'd due to comorbid dz and upcoming intervention procedure - Zpak + pred taper for inflammation -symptomatic care advised  Compression fx - planning KP in 72h  AF - on [facility] bridge pending KP

## 2012-08-12 NOTE — Patient Instructions (Signed)
It was good to see you today. Zpak antibiotics and prednisone taper - refill on tramadol for pin (and cough) - Your prescription(s) have been submitted to your pharmacy. Please take as directed and contact our office if you believe you are having problem(s) with the medication(s). Ok for robitussin syrup as needed in addition to tramadol and other meds use tylenol for aches, pain and fever symptoms as discussed Hydrate, rest and call if worse or unimproved

## 2012-08-15 ENCOUNTER — Ambulatory Visit (INDEPENDENT_AMBULATORY_CARE_PROVIDER_SITE_OTHER): Payer: Medicare Other | Admitting: *Deleted

## 2012-08-15 ENCOUNTER — Ambulatory Visit
Admission: RE | Admit: 2012-08-15 | Discharge: 2012-08-15 | Disposition: A | Discharge status: Home / Self Care | Payer: Medicare Other | Source: Ambulatory Visit | Attending: Internal Medicine | Admitting: Internal Medicine

## 2012-08-15 VITALS — BP 180/86 | HR 90 | Temp 95.6°F | Resp 17

## 2012-08-15 DIAGNOSIS — G459 Transient cerebral ischemic attack, unspecified: Secondary | ICD-10-CM

## 2012-08-15 DIAGNOSIS — I4891 Unspecified atrial fibrillation: Secondary | ICD-10-CM

## 2012-08-15 DIAGNOSIS — IMO0002 Reserved for concepts with insufficient information to code with codable children: Secondary | ICD-10-CM

## 2012-08-15 DIAGNOSIS — Z7901 Long term (current) use of anticoagulants: Secondary | ICD-10-CM

## 2012-08-15 LAB — POCT INR: INR: 1.2

## 2012-08-15 MED ORDER — KETOROLAC TROMETHAMINE 30 MG/ML IJ SOLN
30.0000 mg | Freq: Once | INTRAMUSCULAR | Status: AC
  Administered 2012-08-15: 30 mg via INTRAVENOUS

## 2012-08-15 MED ORDER — MIDAZOLAM HCL 2 MG/2ML IJ SOLN
1.0000 mg | INTRAMUSCULAR | Status: DC | PRN
  Administered 2012-08-15: 1 mg via INTRAVENOUS
  Administered 2012-08-15: 0.5 mg via INTRAVENOUS

## 2012-08-15 MED ORDER — SODIUM CHLORIDE 0.9 % IV SOLN
Freq: Once | INTRAVENOUS | Status: AC
  Administered 2012-08-15: 10:00:00 via INTRAVENOUS

## 2012-08-15 MED ORDER — CEFAZOLIN SODIUM-DEXTROSE 2-3 GM-% IV SOLR
2.0000 g | Freq: Once | INTRAVENOUS | Status: AC
  Administered 2012-08-15: 2 g via INTRAVENOUS

## 2012-08-15 MED ORDER — FENTANYL CITRATE 0.05 MG/ML IJ SOLN
25.0000 ug | INTRAMUSCULAR | Status: DC | PRN
  Administered 2012-08-15: 50 ug via INTRAVENOUS
  Administered 2012-08-15: 25 ug via INTRAVENOUS

## 2012-08-15 NOTE — Progress Notes (Signed)
OK'd by Hoss to leave early, as meds for cough are at home.  It has been over an hour since last dose of sedatives from VP.  jkl

## 2012-08-15 NOTE — Progress Notes (Signed)
Patient escorted to bathroom with daughter's assistance; gait steady.  Denies pain.  Cough improving.  jkl

## 2012-08-15 NOTE — Progress Notes (Addendum)
Heart sounds are irregular, pt has history of a-fib. Lungs are coarse thru out. INR 1.2 this am.

## 2012-08-15 NOTE — Progress Notes (Signed)
Patient's daughter Efraim Kaufmann at bedside.  Patient resting comfortably, though with productive cough (thought to be from lying prone for nearly an hour during procedure).  Denies pain, even with coughing.  VSS.  jkl

## 2012-08-15 NOTE — Progress Notes (Signed)
Patient's daughter states patient's last Lovenox injection was yesterday morning.  They have instructions from cardiologist to continue/resume Lovenox and Coumadin after procedure.  jkl

## 2012-08-16 ENCOUNTER — Telehealth: Payer: Self-pay | Admitting: *Deleted

## 2012-08-16 MED ORDER — AMOXICILLIN 500 MG PO CAPS: 500.0000 mg | ORAL_CAPSULE | Freq: Three times a day (TID) | ORAL | Status: DC

## 2012-08-16 NOTE — Telephone Encounter (Signed)
Pt was not on warfarin when Zpack was rx'd as it was being held for KP.... Regardless, no zpak - use doxy bid x 1 week instead - erx done

## 2012-08-16 NOTE — Telephone Encounter (Signed)
Left msg on vm stating pharmacist told her that the z-pack md rx for mom will interfere with her warfarin. Did not give mom the antibiotic. She had her procedure yesterday, but her cough is not better also still wheezing. Want to ask md should she give her zpack or rx something else...Raechel Chute

## 2012-08-16 NOTE — Telephone Encounter (Signed)
Notified Mellissa was not home left msg with her husband with md response...Raechel Chute

## 2012-08-18 ENCOUNTER — Other Ambulatory Visit: Payer: Medicare Other

## 2012-08-22 ENCOUNTER — Ambulatory Visit (INDEPENDENT_AMBULATORY_CARE_PROVIDER_SITE_OTHER)
Admission: RE | Admit: 2012-08-22 | Discharge: 2012-08-22 | Disposition: A | Discharge status: Home / Self Care | Payer: Medicare Other | Source: Ambulatory Visit | Attending: Internal Medicine | Admitting: Internal Medicine

## 2012-08-22 ENCOUNTER — Telehealth: Payer: Self-pay | Admitting: *Deleted

## 2012-08-22 ENCOUNTER — Ambulatory Visit (INDEPENDENT_AMBULATORY_CARE_PROVIDER_SITE_OTHER): Payer: Medicare Other

## 2012-08-22 DIAGNOSIS — R911 Solitary pulmonary nodule: Secondary | ICD-10-CM

## 2012-08-22 DIAGNOSIS — I4891 Unspecified atrial fibrillation: Secondary | ICD-10-CM

## 2012-08-22 DIAGNOSIS — G459 Transient cerebral ischemic attack, unspecified: Secondary | ICD-10-CM

## 2012-08-22 DIAGNOSIS — Z7901 Long term (current) use of anticoagulants: Secondary | ICD-10-CM

## 2012-08-22 MED ORDER — IOHEXOL 300 MG/ML  SOLN
80.0000 mL | Freq: Once | INTRAMUSCULAR | Status: AC | PRN
  Administered 2012-08-22: 80 mL via INTRAVENOUS

## 2012-08-22 NOTE — Telephone Encounter (Signed)
Daughter [name] left msg on vm requesting to speak with md concerning mom CT...Raechel Chute

## 2012-08-22 NOTE — Telephone Encounter (Signed)
Report reviewed - [phone] (home)    Reviewed with pulm - will refer to pulm for est with multidisciplinary team review   Spoke with pt by phone: "spot in RLL" on CT that needs further eval - will refer to pulm to eval same  then called dtr [name] [phone]) but no answer -I DID NOT LMOM -

## 2012-08-22 NOTE — Telephone Encounter (Signed)
Right lower lobe mass is highly worrisome for primary bronchogenic carcinoma. Recommending consultation with [facility] multidisciplinary thoracic clinic. Will fax report...Raechel Chute

## 2012-08-22 NOTE — Telephone Encounter (Signed)
Called dtr [name] at her home number [phone] (called 1st to pt who reports [name] at home, not their home) - LMOM asking [name] to retunr call and/or given alternate contact number -

## 2012-08-23 NOTE — Telephone Encounter (Signed)
Daughter called back and spoke with Dr. Felicity Coyer this am. Closing note...Raechel Chute

## 2012-09-05 ENCOUNTER — Institutional Professional Consult (permissible substitution): Payer: Medicare Other | Admitting: Internal Medicine

## 2012-09-07 ENCOUNTER — Institutional Professional Consult (permissible substitution): Payer: Medicare Other | Admitting: Pulmonary Disease

## 2012-09-13 ENCOUNTER — Ambulatory Visit (INDEPENDENT_AMBULATORY_CARE_PROVIDER_SITE_OTHER): Payer: Medicare Other | Admitting: General Practice

## 2012-09-13 DIAGNOSIS — I4891 Unspecified atrial fibrillation: Secondary | ICD-10-CM

## 2012-09-13 DIAGNOSIS — G459 Transient cerebral ischemic attack, unspecified: Secondary | ICD-10-CM

## 2012-09-13 DIAGNOSIS — Z7901 Long term (current) use of anticoagulants: Secondary | ICD-10-CM

## 2012-09-23 ENCOUNTER — Institutional Professional Consult (permissible substitution): Payer: Medicare Other | Admitting: Pulmonary Disease

## 2012-09-26 ENCOUNTER — Telehealth: Payer: Self-pay | Admitting: Pulmonary Disease

## 2012-09-26 NOTE — Telephone Encounter (Signed)
Spoke with pt She had to cancel appt 2/14 due to weather Asking for appt on 2/26 with [facility] for pulm nodule Nothing available that day so set her up to see MW for 09/28/12 at 2:15 pm Nothing further needed per pt

## 2012-09-28 ENCOUNTER — Ambulatory Visit (INDEPENDENT_AMBULATORY_CARE_PROVIDER_SITE_OTHER): Payer: Medicare Other | Admitting: Internal Medicine

## 2012-09-28 ENCOUNTER — Encounter: Payer: Self-pay | Admitting: Internal Medicine

## 2012-09-28 VITALS — BP 122/68 | HR 68 | Temp 97.4°F | Ht 59.0 in | Wt 122.2 lb

## 2012-09-28 DIAGNOSIS — R918 Other nonspecific abnormal finding of lung field: Secondary | ICD-10-CM

## 2012-09-28 DIAGNOSIS — R222 Localized swelling, mass and lump, trunk: Secondary | ICD-10-CM

## 2012-09-28 NOTE — Progress Notes (Signed)
  Subjective:    Patient ID: Wendy Hopkins, female    DOB: 06/26/1926 MRN: [id number]  HPI  89 yowf never smoker only passive exposure referred 09/28/2012 by Dr Felicity Coyer for R pulmonary nodule found on w/u of back pain   09/28/2012 [facility]/ 1st pulmonary eval cc doe x steps at [employer] x years also with  Walking  "any distance in a hurry". No h/o hemoptysis.  Back pain better p T12 vertebroplasty 08/15/12  No obvious daytime variabilty or assoc productive cough or cp or chest tightness, subjective wheeze overt sinus or hb symptoms. No unusual exp hx or h/o childhood pna/ asthma or premature birth to her knowledge.   Sleeping ok without nocturnal  or early am exacerbation  of respiratory  c/o's or need for noct saba. Also denies any obvious fluctuation of symptoms with weather or environmental changes or other aggravating or alleviating factors except as outlined above    Review of Systems  Constitutional: Negative for fever and unexpected weight change.  HENT: Positive for rhinorrhea. Negative for ear pain, nosebleeds, congestion, sore throat, sneezing, trouble swallowing, dental problem, postnasal drip and sinus pressure.   Eyes: Negative for redness and itching.  Respiratory: Positive for cough and shortness of breath. Negative for chest tightness and wheezing.   Cardiovascular: Negative for palpitations and leg swelling.  Gastrointestinal: Negative for nausea and vomiting.  Genitourinary: Negative for dysuria.  Musculoskeletal: Negative for joint swelling.  Skin: Negative for rash.  Neurological: Negative for headaches.  Hematological: Does not bruise/bleed easily.  Psychiatric/Behavioral: Negative for dysphoric mood. The patient is not nervous/anxious.        Objective:   Physical Exam  Wt Readings from Last 3 Encounters:  09/28/12 122 lb 3.2 oz (55.43 kg)  06/20/12 133 lb 3.2 oz (60.419 kg)  02/26/12 125 lb (56.7 kg)     HEENT: nl dentition, turbinates, and orophanx. Nl  external ear canals without cough reflex   NECK :  without JVD/Nodes/TM/ nl carotid upstrokes bilaterally   LUNGS: no acc muscle use, clear to A and P bilaterally without cough on insp or exp maneuvers   CV:  RRR  no s3 or murmur or increase in P2, no edema   ABD:  soft and nontender with nl excursion in the supine position. No bruits or organomegaly, bowel sounds nl  MS:  warm  withou calf tenderness, cyanosis or clubbing. Moderate Thoracic kyphosis  SKIN: warm and dry without lesions    NEURO:  alert, approp, no deficits   cxr 08/12/09 no nodule CT 08/22/12 Right lower lobe mass is highly worrisome for primary  bronchogenic carcinoma. If this is a non-small cell lung cancer,  imaging is most consistent with T2aN0M0 or stage I B disease      Assessment & Plan:

## 2012-09-28 NOTE — Patient Instructions (Addendum)
Please see patient coordinator before you leave today  to schedule PET scan and I will call results.

## 2012-09-29 NOTE — Assessment & Plan Note (Addendum)
Main risk factor for lung ca is passive smoke exposure and the only issue really is how best to approach a tissue dx - Spirometry 09/29/12 FEV1  1.08 (77%) with ratio 67 / poor effort on f/v loop   Discussed in detail all the  indications, usual  risks and alternatives  relative to the benefits with patient and fm who agree to proceed with pet scan and on basis of lung function if truly is Stage II then consider for either segmentectomy as doubt she would tolerate lobectomy well at age 79.   Other option would be FNA bx then RT/palliative rx but she is still fairly robust so favor a more aggressive approach

## 2012-10-05 ENCOUNTER — Ambulatory Visit (INDEPENDENT_AMBULATORY_CARE_PROVIDER_SITE_OTHER): Payer: Medicare Other | Admitting: General Practice

## 2012-10-05 DIAGNOSIS — Z7901 Long term (current) use of anticoagulants: Secondary | ICD-10-CM

## 2012-10-05 DIAGNOSIS — I4891 Unspecified atrial fibrillation: Secondary | ICD-10-CM

## 2012-10-05 DIAGNOSIS — G459 Transient cerebral ischemic attack, unspecified: Secondary | ICD-10-CM

## 2012-10-05 LAB — POCT INR: INR: 2.5

## 2012-10-06 ENCOUNTER — Encounter (HOSPITAL_COMMUNITY)
Admission: RE | Admit: 2012-10-06 | Discharge: 2012-10-06 | Disposition: A | Discharge status: Home / Self Care | Payer: Medicare Other | Source: Ambulatory Visit | Attending: Internal Medicine | Admitting: Internal Medicine

## 2012-10-06 DIAGNOSIS — R918 Other nonspecific abnormal finding of lung field: Secondary | ICD-10-CM

## 2012-10-06 DIAGNOSIS — R222 Localized swelling, mass and lump, trunk: Secondary | ICD-10-CM | POA: Insufficient documentation

## 2012-10-06 LAB — GLUCOSE, CAPILLARY: Glucose-Capillary: 343 mg/dL — ABNORMAL HIGH (ref 70–99)

## 2012-10-11 ENCOUNTER — Ambulatory Visit (HOSPITAL_COMMUNITY): Payer: Medicare Other

## 2012-10-18 ENCOUNTER — Encounter: Payer: Self-pay | Admitting: Internal Medicine

## 2012-10-18 ENCOUNTER — Ambulatory Visit (HOSPITAL_COMMUNITY)
Admission: RE | Admit: 2012-10-18 | Discharge: 2012-10-18 | Disposition: A | Discharge status: Home / Self Care | Payer: Medicare Other | Source: Ambulatory Visit | Attending: Internal Medicine | Admitting: Internal Medicine

## 2012-10-18 DIAGNOSIS — E041 Nontoxic single thyroid nodule: Secondary | ICD-10-CM | POA: Insufficient documentation

## 2012-10-18 DIAGNOSIS — R911 Solitary pulmonary nodule: Secondary | ICD-10-CM | POA: Insufficient documentation

## 2012-10-18 DIAGNOSIS — M949 Disorder of cartilage, unspecified: Secondary | ICD-10-CM | POA: Insufficient documentation

## 2012-10-18 DIAGNOSIS — M899 Disorder of bone, unspecified: Secondary | ICD-10-CM | POA: Insufficient documentation

## 2012-10-18 DIAGNOSIS — N289 Disorder of kidney and ureter, unspecified: Secondary | ICD-10-CM | POA: Insufficient documentation

## 2012-10-18 DIAGNOSIS — R222 Localized swelling, mass and lump, trunk: Secondary | ICD-10-CM | POA: Insufficient documentation

## 2012-10-18 LAB — GLUCOSE, CAPILLARY: Glucose-Capillary: 227 mg/dL — ABNORMAL HIGH (ref 70–99)

## 2012-10-18 MED ORDER — FLUDEOXYGLUCOSE F - 18 (FDG) INJECTION
15.9000 | Freq: Once | INTRAVENOUS | Status: AC | PRN
  Administered 2012-10-18: 15.9 via INTRAVENOUS

## 2012-10-21 ENCOUNTER — Telehealth: Payer: Self-pay | Admitting: Internal Medicine

## 2012-10-21 DIAGNOSIS — R918 Other nonspecific abnormal finding of lung field: Secondary | ICD-10-CM

## 2012-10-21 NOTE — Telephone Encounter (Signed)
I have sent referral to [facility] for T surgery eval

## 2012-10-21 NOTE — Progress Notes (Signed)
Quick Note:  Spoke with pt and notified of results/recs per Dr. Sherene Sires. She did not understand. Wants Dr Sherene Sires to call her. I have sent him phone note. ______

## 2012-10-21 NOTE — Telephone Encounter (Signed)
I called the pt with PET scan results  She states needs further clarification on this and had some questions that I was unable to answer I have not scheduled the T surgery referral yet since, she wanted to speak with Dr Sherene Sires first Please call the pt, thanks!

## 2012-10-21 NOTE — Telephone Encounter (Signed)
I have sent order to [facility] for PFT to be scheduled

## 2012-10-21 NOTE — Telephone Encounter (Signed)
Discussed with patient rec T surgery eval next

## 2012-10-25 ENCOUNTER — Other Ambulatory Visit: Payer: Self-pay

## 2012-10-25 ENCOUNTER — Ambulatory Visit (HOSPITAL_COMMUNITY)
Admission: RE | Admit: 2012-10-25 | Discharge: 2012-10-25 | Disposition: A | Discharge status: Home / Self Care | Payer: Medicare Other | Source: Ambulatory Visit | Attending: Internal Medicine | Admitting: Internal Medicine

## 2012-10-25 ENCOUNTER — Encounter: Payer: Self-pay | Admitting: Cardiothoracic Surgery

## 2012-10-25 ENCOUNTER — Ambulatory Visit (INDEPENDENT_AMBULATORY_CARE_PROVIDER_SITE_OTHER): Payer: Medicare Other | Admitting: Cardiothoracic Surgery

## 2012-10-25 VITALS — BP 105/60 | HR 79 | Resp 16 | Ht 59.0 in | Wt 122.0 lb

## 2012-10-25 DIAGNOSIS — R222 Localized swelling, mass and lump, trunk: Secondary | ICD-10-CM

## 2012-10-25 DIAGNOSIS — R918 Other nonspecific abnormal finding of lung field: Secondary | ICD-10-CM

## 2012-10-25 LAB — PULMONARY FUNCTION TEST

## 2012-10-25 MED ORDER — ALBUTEROL SULFATE (5 MG/ML) 0.5% IN NEBU
2.5000 mg | INHALATION_SOLUTION | Freq: Once | RESPIRATORY_TRACT | Status: AC
  Administered 2012-10-25: 2.5 mg via RESPIRATORY_TRACT

## 2012-10-25 NOTE — Patient Instructions (Signed)
Pulmonary Nodule A pulmonary (lung) nodule is small, round growth in the lung. The size of a pulmonary nodule can be as small as a pencil eraser (1/5 inch or 4 millmeters) to a little bigger than your biggest toenail (1 inch or 25 millimeters). A pulmonary nodule is usually an unplanned finding. It may be found on a chest X-ray or a computed tomography (CT) scan when you have imaging tests of your lungs done. When a pulmonary nodule is found, tests will be done to determine if the nodule is benign (not cancerous) or malignant (cancerous). Follow-up treatment or testing is based on the size of the pulmonary nodule and your risk of getting lung cancer.  CAUSES Causes of pulmonary nodules can vary.  Benign pulmonary nodules  can be caused from different things. Some of these things include:  Infection. This can be a common cause of a benign pulmonary nodule. The infection may be active (a current infection) or an old infection that is no longer active. Three types of infections can cause a pulmonary nodule. These are:  Bacterial Infection.  Fungal infection.  Viral Infections.  Hematoma. This is a bruise in the lung. A hematoma can happen from an injury to your chest.  Some common diseases can lead to benign pulmonary nodules. For example, rheumatoid arthritis can be a cause of a pulmonary nodule.  Other unusual things can cause a benign pulmonary nodule. These can include:  Having had tuberculosis.  Rare diseases, such as a lung cyst. Malignant pulmonary nodules.  These are cancerous growths. The cancer may have:  Started in the lung. Some lung cancers first detected as a pulmonary nodule.  Spread to the lung from cancer somewhere else in the body. This is called metastatic cancer.  Certain risk factors make a cancerous pulmonary nodule more likely. They include:  Age. As people get older, a pulmonary nodule is more likely to be cancerous.  Cancer history. If one of your immediate  family members has had cancer, you have a higher risk of developing cancer.  Smoking. This includes people who currently smoke and those who have quit. DIAGNOSIS To diagnose whether a pulmonary nodule is benign or malignant, a variety of tests will be done. This includes things such as:  Health history. Questions regarding your current health, past health, and family health will be asked.  Blood tests. Results of blood work can show:  Tumor markers for cancer.  Any type of infection.  A skin test called a tuberculin (TB) test may be done. This test can tell if you have been exposed to the germ that causes tuberculosis.  Imaging tests. These take pictures of your lungs. Types of imaging tests include:  Chest X-ray. This can help in several ways. An X-ray gives a close-up look at the pulmonary nodule. A new X-ray can be compared with any X-rays you have had in the past.   Computed tomography  (CT) scan. This test shows smaller pulmonary nodules more clearly than an X-ray.  Positron emission tomography  (PET) scan. This is a test that uses a radioactive substance to identify a pulmonary nodule. A safe amount of radioactive substance is injected into the blood stream. Then, the scan takes a picture of the pulmonary nodule. A malignant pulmonary nodule will absorb the substance faster than a benign pulmonary nodule. The radioactive substance is eliminated from your body in your urine.  Biopsy.  This removes a tiny piece of the pulmonary nodule so it can be checked  under a microscope. Medicine will be given to help keep you relaxed and pain free when a biopsy is done. Types of biopsies include:  Bronchoscopy . This is a surgical procedure. It can be used for pulmonary nodules that are close to the airways in the lung. It uses a scope (a thin tube) with a tiny camera and light on the end. The scope is put in the windpipe. Your caregiver can then see inside the lung. A tiny tool put through the  scope is used to take a small sample of the pulmonary nodule tissue.  Transthoracic needle aspiration . This method is used if the pulmonary nodule is far away from the air passages in the lung. A long, thin needle is put through the chest into the lung nodule. A CT scan is done at the same time which can make it easier to locate the pulmonary nodule.  Surgical lung biopsy . This is a surgical procedure in which the pulmonary nodule is removed. This is usually recommended when the pulmonary nodule is most likely malignant or a biopsy cannot be obtained by either bronchoscopy or transthoracic needle aspiration. PULMONARY NODULE FOLLOW-UP RECOMMENDATIONS The frequency of pulmonary nodule follow-up is based on your risk factors and size of the pulmonary nodule. If your caregiver suspects the pulmonary nodule is cancerous or the pulmonary nodule changes during any of the follow-up CT scans, additional testing or biopsies will be done.   If you have no or low risk of getting lung cancer (non-smoker, no personal cancer history), recommended follow-up is based on the following pulmonary nodule size:  A pulmonary nodule that is < 4 mm does not require any follow-up.  A pulmonary nodule that is 4 to 6 mm should be re-imaged by CT scan in 12 months.  A pulmonary nodule that is 6 to 8 mm should be re-imaged by CT scan at 6 to 12 months and then again at 18 to 24 months if no change in size.  A pulmonary nodule > 8 mm in size should be followed closely and re-imaged by CT scan at 3, 9, and 24 months.   If you are at risk of getting lung cancer (current or former smoker, family history of cancer), recommended follow-up is based on the following pulmonary nodule size:  A pulmonary nodule that is < 4 mm in size should be re-imaged by CT scan in 12 months.  A pulmonary nodule that is 4 to 6 mm in size should be re-imaged by CT scan at 6 to 12 months and again at 18 to 24 months.  A pulmonary nodule that is  6 to 8 mm in size should be re-imaged by CT scan at 3, 9, and 24 months.  A pulmonary nodule > 8 mm in size should be followed closely and re-imaged by CT scan at 3, 9, and 24 months. SEEK MEDICAL CARE IF: While waiting for test results to determine what type of pulmonary nodule you have, be sure to contact your caregiver if you:  Have trouble breathing when you are active.  Feel sick or unusually tired.  Do not feel like eating.  Lose weight without trying to.  Develop chills or night sweats.  Mild or moderate fevers generally have no long-term effects and often do not require treatment. There are a few exceptions (see below). SEEK IMMEDIATE MEDICAL CARE IF:  You cannot catch your breath or you begin wheezing.  You cannot stop coughing.  You cough up blood.  You feel like you are going to pass out or become dizzy.  You have sudden chest pain.  You have a fever or persistent symptoms for more than 72 hours.  You have a fever and your symptoms suddenly get worse. MAKE SURE YOU   Understand these instructions.  Will watch your condition.  Will get help right away if you are not doing well or get worse. Document Released: 05/24/2009 Document Revised: 10/19/2011 Document Reviewed: 05/24/2009 [facility] Patient Information 2013 [place], [place]. Lung Cancer Lung cancer is a tumor which starts as a growth in your lungs. Cancer is a group of many related diseases that begin in cells, the building blocks of the body. Normally, cells grow and divide to produce more cells only when the body needs them. Sometimes cells keep dividing when new cells are not needed. These extra cells may form a mass of tissue called a growth or tumor. Tumors can be either benign (not cancerous) or malignant (cancerous). Cancer can begin in any organ or tissue of the body. The original tumor (where the tumor started out) is called the primary cancer and is usually named for where it begins.  Lung cancer is  the most common cause of cancer death in men and women. There are several different types of lung cancers. Usually, lung cancer is described as either small-cell lung cancer or non-small-cell lung cancer. Other types of cancer occur in the lungs, including carcinoid and cancers spread from other organs. The types of cancer have different behavior and treatment. CAUSES  This cancer usually starts when the lungs are exposed to harmful chemicals. When you quit smoking, your risk of lung cancer falls each year (but is never the same as a person who has never smoked).  Other risks include:   Radon gas exposure.  Asbestos and other industrial substance exposure.  Second hand tobacco smoke.  Air pollution.  Family or personal history of lung cancer.  Age over 59. SYMPTOMS  Lung cancer can cause many symptoms. They depend on the type of cancer, its location and other factors. Symptoms of lung cancer can include:  Cough (either new, different or more severe).  Shortness of breath.  Coughing up blood (hemoptysis).  Chest pain.  Hoarseness.  Swelling of the face.  Drooping eyelid.  Changes in blood tests: low sodium (hyponatremia), high calcium (hypercalcemia) or low blood count (anemia).  Weight loss. In its early stages, lung cancer may not have symptoms and can be discovered by accident. Many of the symptoms above can be caused by diseases other than lung cancer. DIAGNOSIS  In early lung cancer, the patient often does not notice problems. It usually has spread by the time problems are first noticed. Your caregiver may suspect lung cancer based on your symptoms, your exam or based on tests (such as x-rays) obtained for other reasons. Common tests that help your caregiver diagnose your condition include:  Chest x-ray.  CT scan of the lungs and chest.  Blood tests. If a tumor is found, a biopsy will be necessary to confirm that cancer is present and to determine the type of  cancer. TREATMENT   Surgery offers a hope for a cure if the cancer has not spread and the cancer is not a small cell (oat cell) cancer of the lung. Surgery cannot cure the small cell type of cancer.  Radiation Therapy is a form of high energy X-ray that helps slow or kill the cancer. It is often used along with medications (chemotherapy)  to help treat the cancer and control pain.  Chemotherapy is used in combination with surgery in advanced cancer. It is also used in all small cell cancers.  Many new treatments look promising.  Your caregiver can give you more information and discuss treatment options that are best for your type of cancer. HOME CARE INSTRUCTIONS   If you smoke, stop!  Take all medications as told.  Keep all appointments with your caregiver and other specialists.  Ask your caregiver if you should see a cancer specialist, if that has not been arranged.  If you require oxygen or breathing equipment, be sure you know how to use it and who to call with questions.  Follow any special diet directions. If you have problems with appetite, ask your caregiver for help. SEEK MEDICAL CARE IF:   You have had a surgical procedure are you are having trouble recovering.  You have ongoing weight loss.  You have decreased strength or energy past the point when your caregiver said you would feel better.  You develop nausea or lightheadedness.  You have pain that is not improving. SEEK IMMEDIATE MEDICAL CARE IF:   You cough up clotted blood or bright red blood.  Your pain is uncontrolled.  You develop new difficulty breathing or chest pain.  You develop swelling in one or both ankles or legs, or swelling in your face or neck.  You develop new headache or confusion. Document Released: 11/02/2000 Document Revised: 10/19/2011 Document Reviewed: 08/13/2008 [facility] Patient Information 2013 [place], [place].  Cancer, Radiation Treatment Radiation therapy uses ionizing beams  for killing cancer cells and shrinking tumors. Radiation damages both cancer cells and normal cells, but normal cells have the DNA to repair themselves. Cancer cells do not have DNA to repair themselves and therefore, are killed off by the radiation, and the body disposes of them. The goal of therapy is to kill as many cancer cells without causing harm to healthy cells near the cancer. Radiation therapy may also be used to reduce pain (palliative therapy).  RADIATION IS USED FOR DIFFERENT REASONS  As the primary or only treatment for the cancer.  Used before surgery to shrink a tumor.  Used after surgery to stop the growth of any remaining cancer cells.  Used in combination with other treatments to kill cancer cells.  Used in advanced cancer patients to help with symptoms of the cancer. RADIATION THERAPY MAY BE EXTERNAL OR INTERNAL  External beam radiation is used most often. It is given on an outpatient basis. This means you do not have to be hospitalized. The energy (source of radiation) used in external radiation therapy may come from X-rays or gamma rays. Although they are produced in different ways, both use packets of energy (photons). Lower energy beams can be used to destroy cancer cells on the surface of the body. Higher energy beams are used to treat cancer deep within the body. Compared with some other types of radiation, x-rays can deliver radiation to a fairly large area.  Internal radiation is called brachytherapy. There is a lose dose rate (LDR) where permanent seeds are implanted into the patient, usually used for GYN or Prostate cancer. This procedure is an inpatient procedure. High dose rate brachytherapy (HDR) is another type of radiation, which is an outpatient procedure. Needles are inserted into the patient, and the radiation travels through these needles to deliver the dose prescribed. This procedure is mainly used for prostate and breast cancers. Both of these types use a live  source of radiation. Radiation therapy may be used to treat almost all types of tumors. It is also used to treat leukemia and lymphoma. There are a few that are more radio resistant and may not respond to radiation alone. These are cancers of the blood-forming cells and lymphatic system.  For some types of cancer, radiation may be given to areas that do not have evidence of cancer. This is done to prevent cancer cells from growing in the area receiving the radiation. This technique is called prophylactic radiation therapy.  RISKS AND COMPLICATIONS Side effects of radiation therapy depend on which part of your body is exposed to radiation and how much radiation is used. Most people are affected by fatigue, which is the most common side effect.  Everybody deals with radiation differently. You cannot predict what the side effects will be. They do not occur right away, and it can take 2-3 weeks to develop side effects. Most side effects are temporary and can be controlled. Once the treatment is complete, the side effects do not stop right away. It can take up to 3-4 weeks to regain your energy or for the redness to go away. Your body does heal from the radiation. Some common side effects are:  Difficulty swallowing, coughing (head and neck cancers and lung cancers).  Feeling sick to your stomach (nauseous), vomiting and/or diarrhea (abdominal area or pelvis being treated).  Bladder problems, frequent urination and/or sexual dysfunction (bladder, kidney, prostate cancer).  Hair loss (brain tumors). BEFORE THE PROCEDURE There will be a planning simulation usually in the radiation department using a CT planning scan. Your radiation oncologist will plan exactly where the radiation will be delivered. The treatment fields will be planned just for you in order to treat you the best way possible. They also make a plan to avoid critical structures in the body. You may also be given a dye (contrast) during this CT  planning scan or an MRI.  You will be positioned how you will be everyday for your treatment. The goal is to have a position that can be reproduced daily.  Usually, temporary marks will be placed on your body to give the therapists a place to shift from once the plan is complete. Then, tattoos are usually put on you in order for you to line up in the same way each day. These tattoos are permanent, but are the size of a freckle. PROCEDURE  You will lie perfectly still on a table in the position determined for treatment, while the linear accelerator moves around you.  This machine delivers the radiation in exact doses from many possible angles.  Treatments are usually spread out over several weeks. This is to allow your healthy cells to recover between sessions. Usually, treatments are spread out between 5-6 weeks, but this is determined by your radiation oncologist.  There is no pain during this treatment. You will not feel the radiation being delivered. The treatment takes about 15-20 minutes, including setup time. AFTER THE PROCEDURE You will have tests and follow-up appointments. They are usually 6 weeks to 6 months after radiation to see if the treatment helped reduce pain or eliminate the cancer cells. Document Released: 12/13/2008 Document Revised: 10/19/2011 Document Reviewed: 12/13/2008 [facility] Patient Information 2013 [place], [place].

## 2012-10-25 NOTE — Progress Notes (Signed)
[address]            [place] [address]          [phone]      Wendy Hopkins [facility] Medical Record #[id number] Date of Birth: 04/06/1926  Referring: Nyoka Cowden, MD Primary Care: Rene Paci, MD  Chief Complaint:    Chief Complaint  Patient presents with  . Lung Mass    eval and treat...CT CHEST/PET/PFT'S    History of Present Illness:    Patient is 79 yo female had a Bruni visit for lower back pain and diagnosed with compression fracture of T12 and L3. CT scan of abdomen was preformed and incidental finding of 2.8 cm mass of rt lower lobe suspicious for malignancy. A PET scan has been done and patient is now  referred to Thoracic surgery for evaluation.  Since dec the mass has increased in sizr to 3.5 cm. Patient is ambulatory but does note SOB with exertion. She is non smoker, denies hemoptysis.     Current Activity/ Functional Status:  Patient is independent with mobility/ambulation, transfers, ADL's, IADL's.  Zubrod Score: At the time of surgery this patient's most appropriate activity status/level should be described as: []  Normal activity, no symptoms [x]  Symptoms, fully ambulatory []  Symptoms, in bed less than or equal to 50% of the time []  Symptoms, in bed greater than 50% of the time but less than 100% []  Bedridden []  Moribund   Past Medical History  Diagnosis Date  . Arthritis   . Hyperlipidemia   . 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 Closed fracture of unspecified part of femur 2008  . TIA (transient ischemic attack)   . Edema   . Chronic diastolic heart failure   . Osteoporosis with fracture     compression fx lower T spine and t4  . Thyroid nodule     dominate R nodule, s/p aspiration>abn path>6 mo obs planned (gerkin 06/01/11)  . GERD (gastroesophageal reflux disease)   . Hypertension 2006  . Diabetes mellitus, type II, insulin dependent 08-15-12    over 40 years  ago  . Atrial fibrillation     chronic anticoag; and AFlutter    Past Surgical History  Procedure Laterality Date  . Right femur  06/25/07    Intramedullary open reduction internal fixation  . Vitrectomy  2007    right eye with cataract repair - dr. Gwendalyn Ege  . Tonsillectomy  1947    Family History  Problem Relation Age of Onset  . Hypertension Father     Parent not sure which 1  . Diabetes Brother   . Lung cancer Other brother age 73   . Cancer Sister   . Parkinsonism Sister   Father died 71, mother 46    History  Smoking status  . Never Smoker   Smokeless tobacco  . Never Used    History  Alcohol Use No     Allergies  Allergen Reactions  . Codeine Nausea And Vomiting         Current Outpatient Prescriptions  Medication Sig Dispense Refill  . ACCU-CHEK AVIVA test strip Check twice daily      . ACTONEL 150 MG tablet Take 150 mg by mouth every 30 (thirty) days. Taken on the 1st of each month.      Marland Kitchen atenolol (TENORMIN) 25 MG  tablet Take 12.5 mg by mouth daily.      . Cholecalciferol (VITAMIN D) 2000 UNITS CAPS Take 2,000 Units by mouth daily.        . furosemide (LASIX) 40 MG tablet Take 40 mg by mouth daily. TAKES 60 MG PER DAY- 1 1/2 TABS      . insulin glargine (LANTUS) 100 UNIT/ML injection Inject 8 Units into the skin daily.       . insulin lispro (HUMALOG) 100 UNIT/ML injection Inject 3 Units into the skin 3 (three) times daily before meals.      . Insulin Syringe-Needle U-100 (INSULIN SYRINGE 1CC/30GX5/16") 30G X 5/16" 1 ML MISC Use four times a day      . Lancets (FREESTYLE) lancets Use three times a day to check blood sugar      . rosuvastatin (CRESTOR) 5 MG tablet Take 1 tablet (5 mg total) by mouth daily.  90 tablet  1  . warfarin (COUMADIN) 5 MG tablet Take 2.5-5 mg by mouth daily. 1 tab daily except 0.5 tab on Monday and Thursday       No current facility-administered medications for this visit.       Review of Systems:     Cardiac Review of  Systems: Y or N  Chest Pain [  n  ]  Resting SOB [ n  ] Exertional SOB  Cove.Etienne  ]  Kenese.Mounts [  ]   Pedal Edema [ y  ]    Palpitations Cove.Etienne  ] Syncope  Milo.Brash  ]   Presyncope [ n  ]  General Review of Systems: [Y] = yes [  ]=no Constitional: recent weight change [n  ]; anorexia [  ]; fatigue Cove.Etienne  ]; nausea [  ]; night sweats [  ]; fever [  ]; or chills [  ];                                                                                                                                          Dental: poor dentition[  ]; Last Dentist visit:   Eye : blurred vision [  ]; diplopia [   ]; vision changes [  ];  Amaurosis fugax[  ]; Resp: cough [  ];  n];  hemoptysis[  n]; shortness of breath[ n ]; paroxysmal nocturnal dyspnea[  ]; dyspnea on exertion[  ]; or orthopnea[  ];  GI:  gallstones[  ], vomiting[  ];  dysphagia[  ]; melena[  ];  hematochezia [  ]; heartburn[  ];   Hx of  Colonoscopy[  ]; GU: kidney stones [  ]; hematuria[  ];   dysuria [  ];  nocturia[  ];  history of     obstruction [  ]; urinary frequency [  ]             Skin: rash, swelling[  ];, hair  loss[  ];  peripheral edema[  ];  or itching[  ]; Musculosketetal: myalgias[  ];  joint swelling[  ];  joint erythema[  ];  joint pain[  ];  back pain[  ];  Heme/Lymph: bruising[  ];  bleeding[  ];  anemia[  ];  Neuro: TIA[  ];  headaches[  ];  stroke[  ];  vertigo[  ];  seizures[  ];   paresthesias[  ];  difficulty walking[n  ];  Psych:depression[  ]; anxiety[  ];  Endocrine: diabetes[  ];  thyroid dysfunction[  ];  Immunizations: Flu Cove.Etienne  ]; Pneumococcal[n  ];  Other:  Physical Exam: BP 105/60  Pulse 79  Resp 16  Ht 4\' 11"  (1.499 m)  Wt 122 lb (55.339 kg)  BMI 24.63 kg/m2  SpO2 95%  General appearance: alert, cooperative, appears stated age and mildly obese Neurologic: intact Heart: regular rate and rhythm, S1, S2 normal, no murmur, click, rub or gallop Lungs: clear to auscultation bilaterally Abdomen: soft, non-tender; bowel sounds normal;  no masses,  no organomegaly Extremities: extremities normal, atraumatic, no cyanosis or edema and Homans sign is negative, no sign of DVT   Diagnostic Studies & Laboratory data:     Recent Radiology Findings:  Nm Pet Image Initial (pi) Skull Base To Thigh  10/18/2012  *RADIOLOGY REPORT*  Clinical Data: Initial treatment strategy for right lower lobe lung nodule.  NUCLEAR MEDICINE PET SKULL BASE TO THIGH  Fasting Blood Glucose:  227  Technique:  15.9 mCi F-18 FDG was injected intravenously. CT data was obtained and used for attenuation correction and anatomic localization only.  (This was not acquired as a diagnostic CT examination.) Additional exam technical data entered on technologist worksheet.  Comparison:  Chest CT 08/22/2012.  Abdominal pelvic CT of 07/30/2012.  Findings:  Neck: Mildly hypermetabolic right-sided thyroid lesion.  This measures 2.0 cm and a S.U.V. max of 5.5.  Chest:  Moderate hypermetabolism corresponding to the right lower lobe lung mass.  This measures 3.7 x 3.1 cm and a S.U.V. max of 4.7 on image 84. Apparent mild hypermetabolism in the right infrahilar region, including on image 83, is felt to be vascular and is without nodal correlate on the diagnostic CT.  Abdomen/Pelvis:  No abnormal hypermetabolism.  Skeleton:  No abnormal marrow activity.  CT  images performed for attenuation correction demonstrate no significant findings in the neck.  No new findings within the chest.  There is trace right-sided pleural fluid.  Low density right renal lesion which is likely a cyst. Normal adrenal glands. Mild bladder distention.  Osteopenia with prior vertebral augmentation and chronic compression deformities.  Old right rib fracture.  IMPRESSION:  1.  Hypermetabolic right lower lobe lung mass, most consistent with primary bronchogenic carcinoma.  If this is a non small cell lung cancer, this is most consistent with T2aN0M0 or stage I B disease. 2.  Hypermetabolic right-sided thyroid nodule.   Hypermetabolic thyroid nodules on PET have up to 40-50% incidence of malignancy; recommend further evaluation with thyroid ultrasound and possible US-guided fine needle aspiration.   Original Report Authenticated By: Jeronimo Greaves, M.D.     On CT 07/30/2012 right lower lobe mass was 2.5 cm now 3.7cm   Recent Lab Findings: Lab Results  Component Value Date   WBC 9.2 07/30/2012   HGB 13.1 07/30/2012   HCT 38.5 07/30/2012   PLT 186 07/30/2012   GLUCOSE 267* 07/30/2012   CHOL 135 06/20/2012   TRIG 86.0 06/20/2012  HDL 59.90 06/20/2012   LDLCALC 58 06/20/2012   ALT 16 07/30/2012   AST 24 07/30/2012   NA 124* 07/30/2012   K 3.8 07/30/2012   CL 85* 07/30/2012   CREATININE 0.95 07/30/2012   BUN 19 07/30/2012   CO2 29 07/30/2012   TSH 1.98 08/24/2011   INR 2.5 10/05/2012   HGBA1C 9.0* 03/04/2012   PFT's  FEV1 1.15 98%  DLCO 13.71 92%   Assessment / Plan:    1.  Hypermetabolic right lower lobe lung mass, most consistent with primary bronchogenic carcinoma. cT2a cN0cM0 or stage I B disease    2. Diabetes Mellitus  I have discussed diagnosis of Lung Cancer with patient and two daughters. The mass has ben enlarging when first diagnosed 07/30/2012. Without evidence of distant disease resection with right lower lobe or segmentectomy has been discussed with patient, PFT's are not prohibitive to proceed with resection. With the patients age she prefers not to proceed with resection. I have recommended proceeding with needle BX of rt lower lobe lesion and consider Radiation therapy.    Delight Ovens MD  Beeper [phone] Office [phone] 10/25/2012 9:10 PM

## 2012-10-26 ENCOUNTER — Other Ambulatory Visit: Payer: Self-pay

## 2012-10-26 ENCOUNTER — Ambulatory Visit: Payer: Medicare Other | Attending: Internal Medicine | Admitting: Physical Therapy

## 2012-10-26 DIAGNOSIS — IMO0001 Reserved for inherently not codable concepts without codable children: Secondary | ICD-10-CM | POA: Insufficient documentation

## 2012-10-26 DIAGNOSIS — M545 Low back pain, unspecified: Secondary | ICD-10-CM | POA: Insufficient documentation

## 2012-10-26 DIAGNOSIS — D381 Neoplasm of uncertain behavior of trachea, bronchus and lung: Secondary | ICD-10-CM

## 2012-10-26 DIAGNOSIS — R5381 Other malaise: Secondary | ICD-10-CM | POA: Insufficient documentation

## 2012-10-27 ENCOUNTER — Encounter: Payer: Self-pay | Admitting: Internal Medicine

## 2012-10-31 ENCOUNTER — Telehealth: Payer: Self-pay | Admitting: Pharmacist

## 2012-10-31 NOTE — Telephone Encounter (Signed)
Pt is followed by primary care for anticoagulation.  They arranged Lovenox bridge for patient in the past.  Will forward to Bailey Mech, RN to follow up with patient.

## 2012-10-31 NOTE — Telephone Encounter (Signed)
Message copied by Velda Shell on Mon Oct 31, 2012  3:25 PM ------      Message from: Duke Salvia      Created: Mon Oct 31, 2012  1:44 PM      Regarding: FW: Coumadin       Geralynn Rile al       this lady has high risk AF with prior TIA  Can i ask you to coordinate bridge via lovenox as requested belwo      Thank steve      ----- Message -----         From: Shari Heritage         Sent: 10/31/2012   1:23 PM           To: Duke Salvia, MD      Subject: Coumadin                                                 Dr. Tyrone Sage is scheduling Mrs. Leclaire fo ra CT needle bx of her (R) lung lesion.  They are requesting her stop her Coumadin and possibly a Lovenox bridge.  Is this ok, and can you have your office coordinate the switch.            Thanks,      Shari Heritage      Surgery Schedular       ------

## 2012-11-02 ENCOUNTER — Ambulatory Visit (INDEPENDENT_AMBULATORY_CARE_PROVIDER_SITE_OTHER): Payer: Medicare Other | Admitting: General Practice

## 2012-11-02 ENCOUNTER — Other Ambulatory Visit: Payer: Self-pay | Admitting: General Practice

## 2012-11-02 ENCOUNTER — Telehealth: Payer: Self-pay | Admitting: General Practice

## 2012-11-02 DIAGNOSIS — Z7901 Long term (current) use of anticoagulants: Secondary | ICD-10-CM

## 2012-11-02 DIAGNOSIS — I4891 Unspecified atrial fibrillation: Secondary | ICD-10-CM

## 2012-11-02 DIAGNOSIS — G459 Transient cerebral ischemic attack, unspecified: Secondary | ICD-10-CM

## 2012-11-02 MED ORDER — ENOXAPARIN SODIUM 60 MG/0.6ML ~~LOC~~ SOLN: 60.0000 mg | Freq: Every day | SUBCUTANEOUS | Status: DC

## 2012-11-02 NOTE — Telephone Encounter (Signed)
Procedure scheduled for 4/1 3/27 Take last dose of coumadin 3/28 Do not take coumadin or Lovenox 3/29 Take Lovenox in am only - No coumadin 3/30 Take Lovenox in am only - No coumadin 3/31 Take Lovenox in am only - No coumadin 4/1 - Procedure (No coumadin or Lovenox) 4/2 - Lovenox in am only - Take 7.5 mg coumadin 4/2 - Lovenox in am only - Take 5 mg coumadin 4/3 - Lovenox in am only - Take 5 mg coumadin 4/4 - Lovenox in am only - Take 5 mg coumadin 4/5 - Lovenox in am only - Take 5 mg coumadin 4/6 - Lovenox in am only - Take 5 mg coumadin 4/7 - Lovenox in am only - Take 5 mg coumadin 4/8 - Re-check in coumadin clinic.

## 2012-11-03 ENCOUNTER — Other Ambulatory Visit: Payer: Self-pay | Admitting: Radiology

## 2012-11-03 NOTE — Telephone Encounter (Signed)
Addendum to note below: Procedure scheduled for 4/1 3/27 Take last dose of coumadin 3/28 No coumadin or Lovenox 3/29 Lovenox in am - No coumadin 3/30 Lovenox in am - No coumadin 3/31 Lovenox in am - No coumadin 4/1 - Procedure (take nothing) 4/2 Lovenox in am - Take 7.5 mg of coumadin 4/3 Lovenox in am - Take 5 mg of coumadin 4/4 Lovenox in am - Take 5 mg of coumadin 4/5 Lovenox in am - Take 5 mg of coumadin 4/6 Lovenox in am - Take 5 mg of coumadin 4/7 Lovenox in am - Take 5 mg of coumadin 4/8 Re-check in clinic.

## 2012-11-08 ENCOUNTER — Ambulatory Visit (HOSPITAL_COMMUNITY)
Admission: RE | Admit: 2012-11-08 | Discharge: 2012-11-08 | Disposition: A | Discharge status: Home / Self Care | Payer: Medicare Other | Source: Ambulatory Visit | Attending: Cardiothoracic Surgery | Admitting: Cardiothoracic Surgery

## 2012-11-08 ENCOUNTER — Encounter (HOSPITAL_COMMUNITY): Payer: Self-pay

## 2012-11-08 DIAGNOSIS — D381 Neoplasm of uncertain behavior of trachea, bronchus and lung: Secondary | ICD-10-CM

## 2012-11-08 DIAGNOSIS — R222 Localized swelling, mass and lump, trunk: Secondary | ICD-10-CM | POA: Insufficient documentation

## 2012-11-08 DIAGNOSIS — C3490 Malignant neoplasm of unspecified part of unspecified bronchus or lung: Secondary | ICD-10-CM | POA: Insufficient documentation

## 2012-11-08 LAB — CBC
MCHC: 33.1 g/dL (ref 30.0–36.0)
Platelets: 172 10*3/uL (ref 150–400)
RDW: 14.9 % (ref 11.5–15.5)
WBC: 5.3 10*3/uL (ref 4.0–10.5)

## 2012-11-08 LAB — APTT: aPTT: 29 seconds (ref 24–37)

## 2012-11-08 LAB — PROTIME-INR
INR: 1.18 (ref 0.00–1.49)
Prothrombin Time: 14.8 seconds (ref 11.6–15.2)

## 2012-11-08 LAB — GLUCOSE, CAPILLARY
Glucose-Capillary: 289 mg/dL — ABNORMAL HIGH (ref 70–99)
Glucose-Capillary: 310 mg/dL — ABNORMAL HIGH (ref 70–99)

## 2012-11-08 MED ORDER — FENTANYL CITRATE 0.05 MG/ML IJ SOLN
INTRAMUSCULAR | Status: AC
  Filled 2012-11-08: qty 4

## 2012-11-08 MED ORDER — FENTANYL CITRATE 0.05 MG/ML IJ SOLN
INTRAMUSCULAR | Status: DC | PRN
  Administered 2012-11-08: 50 ug via INTRAVENOUS

## 2012-11-08 MED ORDER — MIDAZOLAM HCL 2 MG/2ML IJ SOLN
INTRAMUSCULAR | Status: AC
  Filled 2012-11-08: qty 4

## 2012-11-08 MED ORDER — MIDAZOLAM HCL 2 MG/2ML IJ SOLN
INTRAMUSCULAR | Status: DC | PRN
  Administered 2012-11-08: 1 mg via INTRAVENOUS

## 2012-11-08 MED ORDER — INSULIN ASPART 100 UNIT/ML ~~LOC~~ SOLN
0.0000 [IU] | Freq: Three times a day (TID) | SUBCUTANEOUS | Status: DC
  Administered 2012-11-08: 10 [IU] via SUBCUTANEOUS
  Filled 2012-11-08: qty 3

## 2012-11-08 MED ORDER — SODIUM CHLORIDE 0.9 % IV SOLN
Freq: Once | INTRAVENOUS | Status: AC
  Administered 2012-11-08: 08:00:00 via INTRAVENOUS

## 2012-11-08 NOTE — Procedures (Signed)
Procedure:  CT guided core biopsy of RLL lung mass Findings:  17 G needle advanced under CT guidance.  18 G core biopsy x 2.  No immed bleeding or PTX. Plan:  3 hr recovery

## 2012-11-08 NOTE — H&P (Signed)
Agree 

## 2012-11-08 NOTE — H&P (Signed)
Chief Complaint: "I'm here for biopsy" Referring Physician:Gerhardt HPI: Wendy Hopkins is an 79 y.o. female with findign of Rt lower lung mass. Her workup has included CT and PET and she is referred to IR for perc biopsy to obtain tissue diagnosis. PMHx and meds reviewed. She has held her Coumadin since 3/27 and has been on Lovenox bridge. Her last Lovenox was yesterday am. She feels well otherwise.  Past Medical History:  Past Medical History  Diagnosis Date  . Arthritis   . Hyperlipidemia   . 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 Closed fracture of unspecified part of femur 2008  . TIA (transient ischemic attack)   . Edema   . Chronic diastolic heart failure   . Osteoporosis with fracture     compression fx lower T spine and t4  . Thyroid nodule     dominate R nodule, s/p aspiration>abn path>6 mo obs planned ([facility]kin 06/01/11)  . GERD (gastroesophageal reflux disease)   . Hypertension 2006  . Diabetes mellitus, type II, insulin dependent 08-15-12    over 40 years ago  . Atrial fibrillation     chronic anticoag; and AFlutter    Past Surgical History:  Past Surgical History  Procedure Laterality Date  . Right femur  06/25/07    Intramedullary open reduction internal fixation  . Vitrectomy  2007    right eye with cataract repair - dr. Gwendalyn Ege  . Tonsillectomy  1947    Family History:  Family History  Problem Relation Age of Onset  . Hypertension Father     Parent not sure which 1  . Diabetes Brother   . Lung cancer Other   . Cancer Sister   . Parkinsonism Sister     Social History:  reports that she has never smoked. She has never used smokeless tobacco. She reports that she does not drink alcohol or use illicit drugs.  Allergies:  Allergies  Allergen Reactions  . Codeine Nausea And Vomiting         Medications: Current Outpatient Prescriptions   Medication  Sig  Dispense  Refill   .  ACCU-CHEK AVIVA test strip  Check twice daily      .  ACTONEL 150 MG tablet  Take 150 mg by mouth every 30 (thirty) days. Taken on the 1st of each month.     Marland Kitchen  atenolol (TENORMIN) 25 MG tablet  Take 12.5 mg by mouth daily.     .  Cholecalciferol (VITAMIN D) 2000 UNITS CAPS  Take 2,000 Units by mouth daily.     .  furosemide (LASIX) 40 MG tablet  Take 40 mg by mouth daily. TAKES 60 MG PER DAY- 1 1/2 TABS     .  insulin glargine (LANTUS) 100 UNIT/ML injection  Inject 8 Units into the skin daily.     .  insulin lispro (HUMALOG) 100 UNIT/ML injection  Inject 3 Units into the skin 3 (three) times daily before meals.     .  Insulin Syringe-Needle U-100 (INSULIN SYRINGE 1CC/30GX5/16") 30G X 5/16" 1 ML MISC  Use four times a day     .  Lancets (FREESTYLE) lancets  Use three times a day to check blood sugar     .  rosuvastatin (CRESTOR) 5 MG tablet  Take 1 tablet (5 mg total) by mouth daily.  90 tablet  1   .  warfarin (COUMADIN) 5 MG tablet  Take 2.5-5 mg by mouth daily. 1 tab daily  except 0.5 tab on Monday and Thursday         Please HPI for pertinent positives, otherwise complete 10 system ROS negative.  Physical Exam: Blood pressure 174/72, pulse 69, temperature 97.5 F (36.4 C), temperature source Oral, resp. rate 20, height 4\' 11"  (1.499 m), weight 122 lb (55.339 kg), SpO2 100.00%. Body mass index is 24.63 kg/(m^2).   General Appearance:  Alert, cooperative, no distress, appears stated age  Head:  Normocephalic, without obvious abnormality, atraumatic  ENT: Unremarkable  Neck: Supple, symmetrical, trachea midline.  Lungs:   Clear to auscultation bilaterally, no w/r/r, respirations unlabored without use of accessory muscles.  Chest Wall:  No tenderness or deformity  Heart:  Regular rate and rhythm, S1, S2 normal, no murmur, rub or gallop.   Neurologic: Normal affect, no gross deficits.   Results for orders placed during the hospital encounter of 11/08/12 (from the past 48 hour(s))  GLUCOSE, CAPILLARY     Status: Abnormal   Collection  Time    11/08/12  7:41 AM      Result Value Range   Glucose-Capillary 289 (*) 70 - 99 mg/dL   Comment 1 Documented in Chart     Comment 2 Notify RN    APTT     Status: None   Collection Time    11/08/12  7:49 AM      Result Value Range   aPTT 29  24 - 37 seconds  CBC     Status: None   Collection Time    11/08/12  7:49 AM      Result Value Range   WBC 5.3  4.0 - 10.5 K/uL   RBC 4.18  3.87 - 5.11 MIL/uL   Hemoglobin 12.1  12.0 - 15.0 g/dL   HCT 16.1  09.6 - 04.5 %   MCV 87.6  78.0 - 100.0 fL   MCH 28.9  26.0 - 34.0 pg   MCHC 33.1  30.0 - 36.0 g/dL   RDW 40.9  81.1 - 91.4 %   Platelets 172  150 - 400 K/uL  PROTIME-INR     Status: None   Collection Time    11/08/12  7:49 AM      Result Value Range   Prothrombin Time 14.8  11.6 - 15.2 seconds   INR 1.18  0.00 - 1.49   No results found.  Assessment/Plan Rt lower lung mass Ct guided perc bx today Discussed procedure risks, complications, including bleeding, PTX and need for chest tube/admission. Also discussed use of sedation. Labs reviewed, ok Consent signed in chart  Brayton El PA-C 11/08/2012, 9:18 AM

## 2012-11-09 ENCOUNTER — Ambulatory Visit: Payer: Medicare Other | Attending: Internal Medicine | Admitting: Physical Therapy

## 2012-11-09 DIAGNOSIS — R5381 Other malaise: Secondary | ICD-10-CM | POA: Insufficient documentation

## 2012-11-09 DIAGNOSIS — M545 Low back pain, unspecified: Secondary | ICD-10-CM | POA: Insufficient documentation

## 2012-11-09 DIAGNOSIS — IMO0001 Reserved for inherently not codable concepts without codable children: Secondary | ICD-10-CM | POA: Insufficient documentation

## 2012-11-10 ENCOUNTER — Encounter: Payer: Self-pay | Admitting: *Deleted

## 2012-11-10 ENCOUNTER — Institutional Professional Consult (permissible substitution) (INDEPENDENT_AMBULATORY_CARE_PROVIDER_SITE_OTHER): Payer: Medicare Other | Admitting: Cardiothoracic Surgery

## 2012-11-10 ENCOUNTER — Ambulatory Visit
Admission: RE | Admit: 2012-11-10 | Discharge: 2012-11-10 | Disposition: A | Discharge status: Home / Self Care | Payer: Medicare Other | Source: Ambulatory Visit | Attending: Radiation Oncology | Admitting: Radiation Oncology

## 2012-11-10 ENCOUNTER — Encounter: Payer: Self-pay | Admitting: Radiation Oncology

## 2012-11-10 DIAGNOSIS — E041 Nontoxic single thyroid nodule: Secondary | ICD-10-CM

## 2012-11-10 DIAGNOSIS — C3430 Malignant neoplasm of lower lobe, unspecified bronchus or lung: Secondary | ICD-10-CM

## 2012-11-10 NOTE — Progress Notes (Signed)
[address]       [place] [address]             [phone]       GAIL VENDETTI [facility] Medical Record #[id number] Date of Birth: 07-01-1926  Referring: Nyoka Cowden, MD Primary Care: Rene Paci, MD  Chief Complaint:    Chief Complaint  Patient presents with  . Lung Cancer    referral to [facility] for eval and treatment after lung bx    History of Present Illness:    Patient is 79 yo female had a [place] visit for lower back pain and diagnosed with compression fracture of T12 and L3. CT scan of abdomen was preformed and incidental finding of 2.8 cm mass of rt lower lobe suspicious for malignancy. A PET scan has been done and patient is now  referred to Thoracic surgery for evaluation.  Since dec the mass has increased in sizr to 3.5 cm. Patient is ambulatory but does note SOB with exertion. She is non smoker, denies hemoptysis.    After the patient was seen last week arrangements for needle biopsy of the right lung lesion or made and the patient returns today after the biopsy reviewed the findings and discuss further treatment of her adenocarcinoma of the lung.  Current Activity/ Functional Status:  Patient is independent with mobility/ambulation, transfers, ADL's, IADL's.  Zubrod Score: At the time of surgery this patient's most appropriate activity status/level should be described as: []  Normal activity, no symptoms [x]  Symptoms, fully ambulatory []  Symptoms, in bed less than or equal to 50% of the time []  Symptoms, in bed greater than 50% of the time but less than 100% []  Bedridden []  Moribund   Past Medical History  Diagnosis Date  . Arthritis   . Hyperlipidemia   . 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 Closed fracture of unspecified part of femur 2008  . TIA (transient ischemic attack)   . Edema   . Chronic diastolic heart failure   . Osteoporosis with fracture     compression fx lower T spine and t4  .  Thyroid nodule     dominate R nodule, s/p aspiration>abn path>6 mo obs planned ([facility] 06/01/11)  . GERD (gastroesophageal reflux disease)   . Hypertension 2006  . Diabetes mellitus, type II, insulin dependent 08-15-12    over 40 years ago  . Atrial fibrillation     chronic anticoag; and AFlutter    Past Surgical History  Procedure Laterality Date  . Right femur  06/25/07    Intramedullary open reduction internal fixation  . Vitrectomy  2007    right eye with cataract repair - dr. Gwendalyn Ege  . Tonsillectomy  1947    Family History  Problem Relation Age of Onset  . Hypertension Father     Parent not sure which 1  . Diabetes Brother   . Lung cancer Other brother age 69   . Cancer Sister   . Parkinsonism Sister   Father died 26, mother 27    History  Smoking status  . Never Smoker   Smokeless tobacco  . Never Used    History  Alcohol Use No     Allergies  Allergen Reactions  . Codeine Nausea And Vomiting         Current Outpatient Prescriptions  Medication Sig Dispense Refill  . ACCU-CHEK AVIVA test strip Check twice daily      . ACTONEL 150  MG tablet Take 150 mg by mouth every 30 (thirty) days. Taken on the 1st of each month.      Marland Kitchen atenolol (TENORMIN) 25 MG tablet Take 12.5 mg by mouth daily.      . Cholecalciferol (VITAMIN D) 2000 UNITS CAPS Take 2,000 Units by mouth daily.        Marland Kitchen enoxaparin (LOVENOX) 60 MG/0.6ML injection Inject 0.6 mLs (60 mg total) into the skin daily.  15 Syringe  0  . furosemide (LASIX) 40 MG tablet Take 60 mg by mouth daily. TAKES 60 MG PER DAY- 1 1/2 TABS      . insulin glargine (LANTUS) 100 UNIT/ML injection Inject 8 Units into the skin daily.       . insulin lispro (HUMALOG) 100 UNIT/ML injection Inject 3 Units into the skin 3 (three) times daily before meals.      . Insulin Syringe-Needle U-100 (INSULIN SYRINGE 1CC/30GX5/16") 30G X 5/16" 1 ML MISC Use four times a day      . Lancets (FREESTYLE) lancets Use three times a day to check  blood sugar      . rosuvastatin (CRESTOR) 5 MG tablet Take 1 tablet (5 mg total) by mouth daily.  90 tablet  1  . warfarin (COUMADIN) 5 MG tablet Take 2.5-5 mg by mouth daily. 1 tab daily except 0.5 tab on Monday and Thursday       No current facility-administered medications for this visit.       Review of Systems:     Cardiac Review of Systems: Y or N  Chest Pain [  n  ]  Resting SOB [ n  ] Exertional SOB  Cove.Etienne  ]  Kenese.Mounts [  ]   Pedal Edema [ y  ]    Palpitations Cove.Etienne  ] Syncope  Milo.Brash  ]   Presyncope [ n  ]  General Review of Systems: [Y] = yes [  ]=no Constitional: recent weight change [n  ]; anorexia [  ]; fatigue Cove.Etienne  ]; nausea [  ]; night sweats [  ]; fever [  ]; or chills [  ];                                                                                                                                          Dental: poor dentition[  ]; Last Dentist visit:   Eye : blurred vision [  ]; diplopia [   ]; vision changes [  ];  Amaurosis fugax[  ]; Resp: cough [  ];  n];  hemoptysis[  n]; shortness of breath[ n ]; paroxysmal nocturnal dyspnea[  ]; dyspnea on exertion[  ]; or orthopnea[  ];  GI:  gallstones[  ], vomiting[  ];  dysphagia[  ]; melena[  ];  hematochezia [  ]; heartburn[  ];   Hx of  Colonoscopy[  ]; GU: kidney stones [  ];  hematuria[  ];   dysuria [  ];  nocturia[  ];  history of     obstruction [  ]; urinary frequency [  ]             Skin: rash, swelling[  ];, hair loss[  ];  peripheral edema[  ];  or itching[  ]; Musculosketetal: myalgias[  ];  joint swelling[  ];  joint erythema[  ];  joint pain[  ];  back pain[  ];  Heme/Lymph: bruising[  ];  bleeding[  ];  anemia[  ];  Neuro: TIA[  ];  headaches[  ];  stroke[  ];  vertigo[  ];  seizures[  ];   paresthesias[  ];  difficulty walking[n  ];  Psych:depression[  ]; anxiety[  ];  Endocrine: diabetes[  ];  thyroid dysfunction[  ];  Immunizations: Flu Cove.Etienne  ]; Pneumococcal[n  ];  Other:  Physical Exam: BP 103/59  Pulse 75   Temp(Src) 97.3 F (36.3 C) (Oral)  Resp 16  Ht 4\' 11"  (1.499 m)  Wt 122 lb (55.339 kg)  BMI 24.63 kg/m2  SpO2 95%  General appearance: alert, cooperative, appears stated age and mildly obese Neurologic: intact Heart: regular rate and rhythm, S1, S2 normal, no murmur, click, rub or gallop Lungs: clear to auscultation bilaterally Abdomen: soft, non-tender; bowel sounds normal; no masses,  no organomegaly Extremities: extremities normal, atraumatic, no cyanosis or edema and Homans sign is negative, no sign of DVT The posterior side from the needle biopsy is healed well  Diagnostic Studies & Laboratory data:     Recent Radiology Findings:  Nm Pet Image Initial (pi) Skull Base To Thigh  10/18/2012  *RADIOLOGY REPORT*  Clinical Data: Initial treatment strategy for right lower lobe lung nodule.  NUCLEAR MEDICINE PET SKULL BASE TO THIGH  Fasting Blood Glucose:  227  Technique:  15.9 mCi F-18 FDG was injected intravenously. CT data was obtained and used for attenuation correction and anatomic localization only.  (This was not acquired as a diagnostic CT examination.) Additional exam technical data entered on technologist worksheet.  Comparison:  Chest CT 08/22/2012.  Abdominal pelvic CT of 07/30/2012.  Findings:  Neck: Mildly hypermetabolic right-sided thyroid lesion.  This measures 2.0 cm and a S.U.V. max of 5.5.  Chest:  Moderate hypermetabolism corresponding to the right lower lobe lung mass.  This measures 3.7 x 3.1 cm and a S.U.V. max of 4.7 on image 84. Apparent mild hypermetabolism in the right infrahilar region, including on image 83, is felt to be vascular and is without nodal correlate on the diagnostic CT.  Abdomen/Pelvis:  No abnormal hypermetabolism.  Skeleton:  No abnormal marrow activity.  CT  images performed for attenuation correction demonstrate no significant findings in the neck.  No new findings within the chest.  There is trace right-sided pleural fluid.  Low density right renal  lesion which is likely a cyst. Normal adrenal glands. Mild bladder distention.  Osteopenia with prior vertebral augmentation and chronic compression deformities.  Old right rib fracture.  IMPRESSION:  1.  Hypermetabolic right lower lobe lung mass, most consistent with primary bronchogenic carcinoma.  If this is a non small cell lung cancer, this is most consistent with T2aN0M0 or stage I B disease. 2.  Hypermetabolic right-sided thyroid nodule.  Hypermetabolic thyroid nodules on PET have up to 40-50% incidence of malignancy; recommend further evaluation with thyroid ultrasound and possible US-guided fine needle aspiration.   Original Report Authenticated By: Jeronimo Greaves, M.D.  On CT 07/30/2012 right lower lobe mass was 2.5 cm now 3.7cm   Recent Lab Findings: Lab Results  Component Value Date   WBC 5.3 11/08/2012   HGB 12.1 11/08/2012   HCT 36.6 11/08/2012   PLT 172 11/08/2012   GLUCOSE 267* 07/30/2012   CHOL 135 06/20/2012   TRIG 86.0 06/20/2012   HDL 59.90 06/20/2012   LDLCALC 58 06/20/2012   ALT 16 07/30/2012   AST 24 07/30/2012   NA 124* 07/30/2012   K 3.8 07/30/2012   CL 85* 07/30/2012   CREATININE 0.95 07/30/2012   BUN 19 07/30/2012   CO2 29 07/30/2012   TSH 1.98 08/24/2011   INR 1.18 11/08/2012   HGBA1C 9.0* 03/04/2012   PFT's  FEV1 1.15 98%  DLCO 13.71 92% PATH:Lung, needle/core biopsy(ies), Right lower lobe - ADENOCARCINOMA,   Assessment / Plan:    1.  Hypermetabolic right lower lobe lung mass,. cT2a cN0cM0 or stage I B disease    2. Diabetes Mellitus  I have discussed diagnosis of Lung Cancer with patient and two daughters. The mass has ben enlarging when first diagnosed 07/30/2012. Without evidence of distant disease resection with right lower lobe or segmentectomy has been discussed with patient, PFT's are not prohibitive to proceed with resection. With the patients age she prefers not to proceed with resection. I have recommended discussing stereotactic radiotherapy with  radiation therapy.  Delight Ovens MD  Beeper [phone] Office [phone] 11/10/2012 5:49 PM

## 2012-11-10 NOTE — Progress Notes (Unsigned)
Spoke with pt and daughter at [facility] today.  Educational/resource information given and explained on lung cancer and resource in the community/[facility].  Distress screening completed

## 2012-11-11 ENCOUNTER — Encounter: Payer: Self-pay | Admitting: *Deleted

## 2012-11-14 DIAGNOSIS — C3490 Malignant neoplasm of unspecified part of unspecified bronchus or lung: Secondary | ICD-10-CM | POA: Insufficient documentation

## 2012-11-14 NOTE — Progress Notes (Signed)
Radiation Oncology         [phone] ________________________________  Name: Wendy Hopkins MRN: [id number]  Date: 11/10/2012  DOB: 16-Jul-1926  JX:[id number] Wendy Coyer, MD  Delight Ovens, MD     REFERRING PHYSICIAN: Delight Ovens, MD   DIAGNOSIS: Non-small cell lung cancer, T2, N0, M0  HISTORY OF PRESENT ILLNESS::Wendy Hopkins is a 79 y.o. female who is seen for an initial consultation visit. The patient was evaluated for some lower back pain and this included a CT scan of the abdomen. An incidental finding was noted for a right lower lobe mass which was suspicious for malignancy. A chest CT scan was completed on 08/22/2012. This showed a 3.6 x 2.3 cm lesion which was highly worrisome for primary bronchogenic carcinoma. This appeared to represent a T2 A. N0 M0 lung cancer potentially.  Subsequent workup included a PET scan. The right lower lobe mass was hypermetabolic with a maximum SUV of 4.7. There were no other areas of hypermetabolic activity and dictated of metastatic disease. The patient proceeded to undergo a needle core biopsy and this has returned positive for adenocarcinoma.  The patient has therefore been diagnosed as having an early stage non-small cell lung cancer. The patient is interested in nonsurgical options and therefore I been asked to see the patient today in multidisciplinary thoracic clinic.   PREVIOUS RADIATION THERAPY: No   PAST MEDICAL HISTORY:  has a past medical history of Arthritis; Hyperlipidemia; Iron deficiency anemia secondary to blood loss (chronic); Closed fracture of unspecified part of femur (2008); TIA (transient ischemic attack); Edema; Chronic diastolic heart failure; Osteoporosis with fracture; Thyroid nodule; GERD (gastroesophageal reflux disease); Hypertension (2006); Diabetes mellitus, type II, insulin dependent (08-15-12); and Atrial fibrillation.     PAST SURGICAL HISTORY: Past Surgical History  Procedure Laterality Date  . Right  femur  06/25/07    Intramedullary open reduction internal fixation  . Vitrectomy  2007    right eye with cataract repair - dr. Gwendalyn Ege  . Tonsillectomy  1947     FAMILY HISTORY: family history includes Cancer in her sister; Diabetes in her brother; Hypertension in her father; Lung cancer in her other; and Parkinsonism in her sister.   SOCIAL HISTORY:  reports that she has never smoked. She has never used smokeless tobacco. She reports that she does not drink alcohol or use illicit drugs.   ALLERGIES: Codeine   MEDICATIONS:  Current Outpatient Prescriptions  Medication Sig Dispense Refill  . ACCU-CHEK AVIVA test strip Check twice daily      . ACTONEL 150 MG tablet Take 150 mg by mouth every 30 (thirty) days. Taken on the 1st of each month.      Marland Kitchen atenolol (TENORMIN) 25 MG tablet Take 12.5 mg by mouth daily.      . Cholecalciferol (VITAMIN D) 2000 UNITS CAPS Take 2,000 Units by mouth daily.        Marland Kitchen enoxaparin (LOVENOX) 60 MG/0.6ML injection Inject 0.6 mLs (60 mg total) into the skin daily.  15 Syringe  0  . furosemide (LASIX) 40 MG tablet Take 60 mg by mouth daily. TAKES 60 MG PER DAY- 1 1/2 TABS      . insulin glargine (LANTUS) 100 UNIT/ML injection Inject 8 Units into the skin daily.       . insulin lispro (HUMALOG) 100 UNIT/ML injection Inject 3 Units into the skin 3 (three) times daily before meals.      . Insulin Syringe-Needle U-100 (INSULIN SYRINGE 1CC/30GX5/16") 30G X  5/16" 1 ML MISC Use four times a day      . Lancets (FREESTYLE) lancets Use three times a day to check blood sugar      . rosuvastatin (CRESTOR) 5 MG tablet Take 1 tablet (5 mg total) by mouth daily.  90 tablet  1  . warfarin (COUMADIN) 5 MG tablet Take 2.5-5 mg by mouth daily. 1 tab daily except 0.5 tab on Monday and Thursday       No current facility-administered medications for this encounter.     REVIEW OF SYSTEMS:  A 15 point review of systems is documented in the electronic medical record. This was  obtained by the nursing staff. However, I reviewed this with the patient to discuss relevant findings and make appropriate changes.  Pertinent items are noted in HPI.    PHYSICAL EXAM:  height is 4\' 11"  (1.499 m) and weight is 122 lb (55.339 kg). Her oral temperature is 97.6 F (36.4 C). Her blood pressure is 103/59 and her pulse is 75. Her respiration is 16 and oxygen saturation is 95%.   General: Well-developed, in no acute distress HEENT: Normocephalic, atraumatic; oral cavity clear Neck: Supple without any lymphadenopathy Cardiovascular: Regular rate and rhythm Respiratory: Clear to auscultation bilaterally GI: Soft, nontender, normal bowel sounds Extremities: No edema present Neuro: No focal deficits     LABORATORY DATA:  Lab Results  Component Value Date   WBC 5.3 11/08/2012   HGB 12.1 11/08/2012   HCT 36.6 11/08/2012   MCV 87.6 11/08/2012   PLT 172 11/08/2012   Lab Results  Component Value Date   NA 124* 07/30/2012   K 3.8 07/30/2012   CL 85* 07/30/2012   CO2 29 07/30/2012   Lab Results  Component Value Date   ALT 16 07/30/2012   AST 24 07/30/2012   ALKPHOS 96 07/30/2012   BILITOT 0.6 07/30/2012      RADIOGRAPHY: Nm Pet Image Initial (pi) Skull Base To Thigh  10/18/2012  *RADIOLOGY REPORT*  Clinical Data: Initial treatment strategy for right lower lobe lung nodule.  NUCLEAR MEDICINE PET SKULL BASE TO THIGH  Fasting Blood Glucose:  227  Technique:  15.9 mCi F-18 FDG was injected intravenously. CT data was obtained and used for attenuation correction and anatomic localization only.  (This was not acquired as a diagnostic CT examination.) Additional exam technical data entered on technologist worksheet.  Comparison:  Chest CT 08/22/2012.  Abdominal pelvic CT of 07/30/2012.  Findings:  Neck: Mildly hypermetabolic right-sided thyroid lesion.  This measures 2.0 cm and a S.U.V. max of 5.5.  Chest:  Moderate hypermetabolism corresponding to the right lower lobe lung mass.  This  measures 3.7 x 3.1 cm and a S.U.V. max of 4.7 on image 84. Apparent mild hypermetabolism in the right infrahilar region, including on image 83, is felt to be vascular and is without nodal correlate on the diagnostic CT.  Abdomen/Pelvis:  No abnormal hypermetabolism.  Skeleton:  No abnormal marrow activity.  CT  images performed for attenuation correction demonstrate no significant findings in the neck.  No new findings within the chest.  There is trace right-sided pleural fluid.  Low density right renal lesion which is likely a cyst. Normal adrenal glands. Mild bladder distention.  Osteopenia with prior vertebral augmentation and chronic compression deformities.  Old right rib fracture.  IMPRESSION:  1.  Hypermetabolic right lower lobe lung mass, most consistent with primary bronchogenic carcinoma.  If this is a non small cell lung cancer, this is  most consistent with T2aN0M0 or stage I B disease. 2.  Hypermetabolic right-sided thyroid nodule.  Hypermetabolic thyroid nodules on PET have up to 40-50% incidence of malignancy; recommend further evaluation with thyroid ultrasound and possible US-guided fine needle aspiration.   Original Report Authenticated By: Jeronimo Greaves, M.D.    Ct Biopsy  11/08/2012  *RADIOLOGY REPORT*  Clinical Data: Right lower lobe lung mass.  CT GUIDED CORE BIOPSY OF RIGHT LUNG  Sedation:   1.0 mg IV Versed;  50 mcg IV Fentanyl  Total Moderate Sedation Time: 20 minutes.  Procedure:  The procedure risks, benefits, and alternatives were explained to the patient.  Questions regarding the procedure were encouraged and answered.  The patient understands and consents to the procedure.  The right posterior chest wall was prepped with Betadine in a sterile fashion, and a sterile drape was applied covering the operative field.  A sterile gown and sterile gloves were used for the procedure.  Local anesthesia was provided with 1% Lidocaine.  CT was performed in a prone position.  After localizing the  right lower lobe mass, CT guided placement of a 17 gauge trocar needle was performed to the level of the lesion.  After confirming needle tip position, two separate 18 gauge core biopsy samples were obtained and submitted in formalin.  The outer needle was removed. Additional CT images were performed.  Complications: None  Findings: Posterior right lower lobe mass was localized by CT. Maximal dimensions by current imaging are approximately 3.7 x 4.1 cm.  Solid tissue was obtained from the lesion.  There were no immediate complications.  IMPRESSION: CT guided core biopsy performed of a 4 cm right lower lobe lung mass.   Original Report Authenticated By: Irish Lack, M.D.        IMPRESSION: The patient has a recent diagnosis of non-small cell lung cancer of the right lower lobe, T2, N0, M0. The patient was felt to be a reasonable candidate for surgical resection. However, the patient is not interested in this approach and wanted to explore possible definitive radiotherapy. I have seen her today. She denies any significant symptoms with no recent shortness of breath, cough, or hemoptysis. She is a lifelong nonsmoker.  I believe that the patient is an appropriate candidate if she so wishes to proceed with stereotactic body radiotherapy. I discussed this treatment modality with the patient including the potential side effects and risks of treatment. We discussed the rationale of this treatment including the potential success rate. All of her questions were answered. After this discussion, the patient indicates that she does wish to proceed with this treatment plan.   PLAN: The patient will be scheduled for simulation as soon as this can be arranged. I would anticipate a 3 fraction course of radiotherapy.    I spent 60 minutes minutes face to face with the patient and more than 50% of that time was spent in counseling and/or coordination of care.    ________________________________   Radene Gunning,  MD, PhD

## 2012-11-15 ENCOUNTER — Ambulatory Visit (INDEPENDENT_AMBULATORY_CARE_PROVIDER_SITE_OTHER): Payer: Medicare Other | Admitting: General Practice

## 2012-11-15 DIAGNOSIS — G459 Transient cerebral ischemic attack, unspecified: Secondary | ICD-10-CM

## 2012-11-15 DIAGNOSIS — Z7901 Long term (current) use of anticoagulants: Secondary | ICD-10-CM

## 2012-11-15 DIAGNOSIS — I4891 Unspecified atrial fibrillation: Secondary | ICD-10-CM

## 2012-11-15 LAB — POCT INR: INR: 1.6

## 2012-11-18 ENCOUNTER — Ambulatory Visit (INDEPENDENT_AMBULATORY_CARE_PROVIDER_SITE_OTHER): Payer: Medicare Other | Admitting: General Practice

## 2012-11-18 ENCOUNTER — Telehealth: Payer: Self-pay | Admitting: Radiation Oncology

## 2012-11-18 ENCOUNTER — Ambulatory Visit
Admission: RE | Admit: 2012-11-18 | Discharge: 2012-11-18 | Disposition: A | Discharge status: Home / Self Care | Payer: Medicare Other | Source: Ambulatory Visit | Attending: Radiation Oncology | Admitting: Radiation Oncology

## 2012-11-18 DIAGNOSIS — Z510 Encounter for antineoplastic radiation therapy: Secondary | ICD-10-CM | POA: Insufficient documentation

## 2012-11-18 DIAGNOSIS — Z7901 Long term (current) use of anticoagulants: Secondary | ICD-10-CM

## 2012-11-18 DIAGNOSIS — G459 Transient cerebral ischemic attack, unspecified: Secondary | ICD-10-CM

## 2012-11-18 DIAGNOSIS — C3430 Malignant neoplasm of lower lobe, unspecified bronchus or lung: Secondary | ICD-10-CM | POA: Insufficient documentation

## 2012-11-18 DIAGNOSIS — I4891 Unspecified atrial fibrillation: Secondary | ICD-10-CM

## 2012-11-18 NOTE — Telephone Encounter (Signed)
Met w patient to discuss RO billing. Pt had no financial concerns today.  Dx: Lung Mass  Attending Rad: JM  Rad Tx:  SBRT

## 2012-11-21 ENCOUNTER — Ambulatory Visit: Payer: Medicare Other | Admitting: Physical Therapy

## 2012-11-21 NOTE — Progress Notes (Signed)
[facility] Radiation Oncology Simulation and Treatment Planning Note   Name:  Wendy Hopkins MRN: [id number]   Date: 11/18/2012  DOB: 02-27-1926  Status:outpatient    DIAGNOSIS: The encounter diagnosis was Lung cancer, lower lobe, right.  SITE:  Right lower lobe   CONSENT VERIFIED:yes   SET UP: Patient is setup supine   IMMOBILIZATION: The patient was immobilized using a Vac Loc bag, and a customized active form device was also constructed to aid in patient immobilization. The patient set up also involved abdominal compression to attempt to reduce respiratory motion. A total of 2 complex treatment devices therefore will be used for immobilization during the course of radiation.    NARRATIVE:The patient was brought to the CT Simulation planning suite.  Identity was confirmed.  All relevant records and images related to the planned course of therapy were reviewed.  Then, the patient was positioned in a stable reproducible clinical set-up for radiation therapy. Abdominal compression was applied by me.  4D CT images were obtained and reproducible breathing pattern was confirmed. Free breathing CT images were obtained.  Skin markings were placed.  The CT images were loaded into the planning software where the target and avoidance structures were contoured.  The radiation prescription was entered and confirmed.    TREATMENT PLANNING NOTE:  Treatment planning then occurred. I have requested : MLC's, 3D simulation/ isodose plan, basic dose calculation. It is anticipated that 2 customized fields will be used for the patient's treatment, with each of these corresponding to an additional complex treatment device.  3 dimensional simulation is performed and dose volume histogram of the gross tumor volume, planning tumor volume and criticial normal structures including the spinal cord and lungs were analyzed and requested.  Special treatment procedure was performed due to high  dose per fraction and the complexity of the planning process.  The patient will be monitored for increased risk of toxicity.  Daily imaging using cone beam CT/ MV CT will be used for target localization.   PLAN:  The patient will receive 60 Gy in 5 fractions.    ________________________________   Radene Gunning, MD, PhD

## 2012-11-23 ENCOUNTER — Encounter: Payer: Self-pay | Admitting: Radiation Oncology

## 2012-11-30 ENCOUNTER — Ambulatory Visit
Admission: RE | Admit: 2012-11-30 | Discharge: 2012-11-30 | Disposition: A | Discharge status: Home / Self Care | Payer: Medicare Other | Source: Ambulatory Visit | Attending: Radiation Oncology | Admitting: Radiation Oncology

## 2012-11-30 DIAGNOSIS — R918 Other nonspecific abnormal finding of lung field: Secondary | ICD-10-CM

## 2012-11-30 NOTE — Progress Notes (Signed)
  Radiation Oncology         [phone] ________________________________  Name: Wendy Hopkins MRN: [id number]  Date: 11/30/2012  DOB: [birth year]  Stereotactic Body Radiotherapy Treatment Procedure Note  NARRATIVE:  Wendy Hopkins was brought to the stereotactic radiation treatment machine and placed supine on the CT couch. The patient was set up for stereotactic body radiotherapy on the body fix pillow.  3D TREATMENT PLANNING AND DOSIMETRY:  The patient's radiation plan was reviewed and approved prior to starting treatment.  It showed 3-dimensional radiation distributions overlaid onto the planning CT.  The [facility] for the target structures as well as the organs at risk were reviewed. The documentation of this is filed in the radiation oncology EMR.  SIMULATION VERIFICATION:  The patient underwent CT imaging on the treatment unit.  These were carefully aligned to document that the ablative radiation dose would cover the target volume and maximally spare the nearby organs at risk according to the planned distribution.  SPECIAL TREATMENT PROCEDURE: Wendy Hopkins received high dose ablative stereotactic body radiotherapy to the planned target volume without unforeseen complications. Treatment was delivered uneventfully. The high doses associated with stereotactic body radiotherapy and the significant potential risks require careful treatment set up and patient monitoring constituting a special treatment procedure   STEREOTACTIC TREATMENT MANAGEMENT:  Following delivery, the patient was evaluated clinically. The patient tolerated treatment without significant acute effects, and was discharged to home in stable condition.    PLAN: Continue treatment as planned.  ________________________________  Artist Pais. Kathrynn Running, M.D.

## 2012-12-01 ENCOUNTER — Ambulatory Visit: Payer: Medicare Other | Admitting: Radiation Oncology

## 2012-12-02 ENCOUNTER — Ambulatory Visit
Admission: RE | Admit: 2012-12-02 | Discharge: 2012-12-02 | Disposition: A | Discharge status: Home / Self Care | Payer: Medicare Other | Source: Ambulatory Visit | Attending: Radiation Oncology | Admitting: Radiation Oncology

## 2012-12-05 ENCOUNTER — Encounter: Payer: Self-pay | Admitting: Radiation Oncology

## 2012-12-05 ENCOUNTER — Ambulatory Visit
Admission: RE | Admit: 2012-12-05 | Discharge: 2012-12-05 | Disposition: A | Discharge status: Home / Self Care | Payer: Medicare Other | Source: Ambulatory Visit | Attending: Radiation Oncology | Admitting: Radiation Oncology

## 2012-12-05 NOTE — Progress Notes (Signed)
  Radiation Oncology         [phone] ________________________________  Name: ALAISA MOFFITT MRN: [id number]  Date: 12/05/2012  DOB: 09-16-1925  Stereotactic Body Radiotherapy Treatment Procedure Note  NARRATIVE:  EADIE REPETTO was brought to the stereotactic radiation treatment machine and placed supine on the CT couch. The patient was set up for stereotactic body radiotherapy on the body fix pillow.  3D TREATMENT PLANNING AND DOSIMETRY:  The patient's radiation plan was reviewed and approved prior to starting treatment.  It showed 3-dimensional radiation distributions overlaid onto the planning CT.  The [facility] for the target structures as well as the organs at risk were reviewed. The documentation of this is filed in the radiation oncology EMR.  SIMULATION VERIFICATION:  The patient underwent CT imaging on the treatment unit.  These were carefully aligned to document that the ablative radiation dose would cover the right lung target volume and maximally spare the nearby organs at risk according to the planned distribution.  SPECIAL TREATMENT PROCEDURE: FRANZISKA PODGURSKI received high dose ablative stereotactic body radiotherapy to the planned target volume without unforeseen complications. Treatment was delivered uneventfully. She received an additional 1200 cGy. Cumulative dose to date is 3600 cGy in 3 fractions. The high doses associated with stereotactic body radiotherapy and the significant potential risks require careful treatment set up and patient monitoring constituting a special treatment procedure   STEREOTACTIC TREATMENT MANAGEMENT:  Following delivery, the patient was evaluated clinically. The patient tolerated treatment without significant acute effects, and was discharged to home in stable condition.    PLAN: Continue treatment as planned.  ------------------------------------------------        Maryln Gottron, MD

## 2012-12-06 ENCOUNTER — Ambulatory Visit: Payer: Medicare Other | Admitting: Radiation Oncology

## 2012-12-07 ENCOUNTER — Ambulatory Visit
Admission: RE | Admit: 2012-12-07 | Discharge: 2012-12-07 | Disposition: A | Discharge status: Home / Self Care | Payer: Medicare Other | Source: Ambulatory Visit | Attending: Radiation Oncology | Admitting: Radiation Oncology

## 2012-12-07 NOTE — Progress Notes (Signed)
  Radiation Oncology         [phone] ________________________________  Name: PATRYCJA MUMPOWER MRN: [id number]  Date: 12/07/2012  DOB: 1926/03/28   Simulation verification note  The patient underwent film verification for the patient's set-up in preparation for stereotactic body radiosurgery. The patient was placed on the treatment unit and a CT scan was performed. These images were then fused with the patient's planning CT scan. The fusion was carefully reviewed in terms of the patient's anatomy as it related to the planning CT scan. The target structures as well as the organs at risk were evaluated on the patient's treatment CT scan. The target and the normal structures were appropriately aligned for treatment. Therefore the patient proceeded with the fraction of stereotactic body radiosurgery.  Fraction: 4  Dose:  48 Gy   ________________________________  Radene Gunning, MD, PhD

## 2012-12-08 ENCOUNTER — Ambulatory Visit: Payer: Medicare Other | Admitting: Radiation Oncology

## 2012-12-08 ENCOUNTER — Other Ambulatory Visit: Payer: Self-pay | Admitting: *Deleted

## 2012-12-08 MED ORDER — ATENOLOL 25 MG PO TABS: 12.5000 mg | ORAL_TABLET | Freq: Every day | ORAL | Status: DC

## 2012-12-09 ENCOUNTER — Ambulatory Visit
Admission: RE | Admit: 2012-12-09 | Discharge: 2012-12-09 | Disposition: A | Discharge status: Home / Self Care | Payer: Medicare Other | Source: Ambulatory Visit | Attending: Radiation Oncology | Admitting: Radiation Oncology

## 2012-12-09 ENCOUNTER — Encounter: Payer: Self-pay | Admitting: Radiation Oncology

## 2012-12-09 NOTE — Progress Notes (Signed)
  Radiation Oncology         [phone] ________________________________  Name: Wendy Hopkins MRN: [id number]  Date: 12/09/2012  DOB: 07/30/1926   Simulation verification note  The patient underwent film verification for the patient's set-up in preparation for stereotactic body radiosurgery. The patient was placed on the treatment unit and a CT scan was performed. These images were then fused with the patient's planning CT scan. The fusion was carefully reviewed in terms of the patient's anatomy as it related to the planning CT scan. The target structures as well as the organs at risk were evaluated on the patient's treatment CT scan. The target and the normal structures were appropriately aligned for treatment. Therefore the patient proceeded with the fraction of stereotactic body radiosurgery.  Fraction: 5  Dose:  60 Gy   ________________________________  Radene Gunning, MD, PhD

## 2012-12-09 NOTE — Progress Notes (Signed)
Patient completes 5 of 5 SBRT to right lung.Intermittent pain of right thoracic cage.Denies nausea.Shortness of breath on exertion.Denies cough.One month follow up to be scheduled.

## 2012-12-09 NOTE — Progress Notes (Signed)
   Department of Radiation Oncology  Phone:  [phone] Fax:        [phone]  Weekly Treatment Note    Name: Wendy Hopkins Date: 12/09/2012 MRN: [id number] DOB: 20-Sep-1925   Current dose: 60 Gy  Current fraction: 5   MEDICATIONS: Current Outpatient Prescriptions  Medication Sig Dispense Refill  . ACCU-CHEK AVIVA test strip Check twice daily      . ACTONEL 150 MG tablet Take 150 mg by mouth every 30 (thirty) days. Taken on the 1st of each month.      Marland Kitchen atenolol (TENORMIN) 25 MG tablet Take 0.5 tablets (12.5 mg total) by mouth daily.  30 tablet  1  . Cholecalciferol (VITAMIN D) 2000 UNITS CAPS Take 2,000 Units by mouth daily.        Marland Kitchen enoxaparin (LOVENOX) 60 MG/0.6ML injection Inject 0.6 mLs (60 mg total) into the skin daily.  15 Syringe  0  . furosemide (LASIX) 40 MG tablet Take 60 mg by mouth daily. TAKES 60 MG PER DAY- 1 1/2 TABS      . insulin glargine (LANTUS) 100 UNIT/ML injection Inject 8 Units into the skin daily.       . insulin lispro (HUMALOG) 100 UNIT/ML injection Inject 3 Units into the skin 3 (three) times daily before meals.      . Insulin Syringe-Needle U-100 (INSULIN SYRINGE 1CC/30GX5/16") 30G X 5/16" 1 ML MISC Use four times a day      . Lancets (FREESTYLE) lancets Use three times a day to check blood sugar      . rosuvastatin (CRESTOR) 5 MG tablet Take 1 tablet (5 mg total) by mouth daily.  90 tablet  1  . warfarin (COUMADIN) 5 MG tablet Take 2.5-5 mg by mouth daily. 1 tab daily except 0.5 tab on Monday and Thursday       No current facility-administered medications for this encounter.     ALLERGIES: Codeine   LABORATORY DATA:  Lab Results  Component Value Date   WBC 5.3 11/08/2012   HGB 12.1 11/08/2012   HCT 36.6 11/08/2012   MCV 87.6 11/08/2012   PLT 172 11/08/2012   Lab Results  Component Value Date   NA 124* 07/30/2012   K 3.8 07/30/2012   CL 85* 07/30/2012   CO2 29 07/30/2012   Lab Results  Component Value Date   ALT 16 07/30/2012   AST  24 07/30/2012   ALKPHOS 96 07/30/2012   BILITOT 0.6 07/30/2012     NARRATIVE: Wendy Hopkins was seen today for weekly treatment management. The chart was checked and the patient's films were reviewed. The patient finished her final fraction today. She has done well. No change in shortness of breath. No esophagitis.  PHYSICAL EXAMINATION: vitals were not taken for this visit.     alert and oriented x3, in no acute distress  ASSESSMENT: The patient did satisfactorily with treatment.  PLAN: The patient will followup in one month.

## 2012-12-12 ENCOUNTER — Telehealth: Payer: Self-pay | Admitting: *Deleted

## 2012-12-12 NOTE — Telephone Encounter (Signed)
Message copied by Christen Butter on Mon Dec 12, 2012  4:13 PM ------      Message from: Wendy Hopkins      Created: Tue Oct 18, 2012  3:14 PM       Be sure she's seen leschber for thyroid nodule by now ------

## 2012-12-12 NOTE — Telephone Encounter (Signed)
Spoke with pt She states that she has not seen her PCP for this yet, but she will see her on 12/19/12 No further questions per pt

## 2012-12-14 ENCOUNTER — Ambulatory Visit (INDEPENDENT_AMBULATORY_CARE_PROVIDER_SITE_OTHER): Payer: Medicare Other | Admitting: General Practice

## 2012-12-14 DIAGNOSIS — Z7901 Long term (current) use of anticoagulants: Secondary | ICD-10-CM

## 2012-12-14 DIAGNOSIS — I4891 Unspecified atrial fibrillation: Secondary | ICD-10-CM

## 2012-12-14 DIAGNOSIS — G459 Transient cerebral ischemic attack, unspecified: Secondary | ICD-10-CM

## 2012-12-14 LAB — POCT INR: INR: 1.4

## 2012-12-19 ENCOUNTER — Ambulatory Visit (INDEPENDENT_AMBULATORY_CARE_PROVIDER_SITE_OTHER): Payer: Medicare Other | Admitting: Internal Medicine

## 2012-12-19 ENCOUNTER — Encounter: Payer: Self-pay | Admitting: Internal Medicine

## 2012-12-19 VITALS — BP 110/60 | HR 58 | Temp 97.8°F | Wt 124.8 lb

## 2012-12-19 DIAGNOSIS — E042 Nontoxic multinodular goiter: Secondary | ICD-10-CM

## 2012-12-19 DIAGNOSIS — C3490 Malignant neoplasm of unspecified part of unspecified bronchus or lung: Secondary | ICD-10-CM

## 2012-12-19 DIAGNOSIS — K219 Gastro-esophageal reflux disease without esophagitis: Secondary | ICD-10-CM

## 2012-12-19 DIAGNOSIS — C3491 Malignant neoplasm of unspecified part of right bronchus or lung: Secondary | ICD-10-CM

## 2012-12-19 DIAGNOSIS — M810 Age-related osteoporosis without current pathological fracture: Secondary | ICD-10-CM

## 2012-12-19 NOTE — Assessment & Plan Note (Signed)
Dx clarified 11/08/12 with CT chest bx Not surgical candidate - s/o 5 XRT - following with rad onc and pulm Interval hx reviewed

## 2012-12-19 NOTE — Assessment & Plan Note (Signed)
Compression vertebral fx 07/2012 reviewed s/p KP Continue actonel The current medical regimen is effective;  continue present plan and medications.

## 2012-12-19 NOTE — Assessment & Plan Note (Signed)
recommended OTC meds - follow up if unimproved

## 2012-12-19 NOTE — Progress Notes (Signed)
Subjective:    Patient ID: Wendy Hopkins, female    DOB: August 08, 1926, 79 y.o.   MRN: [id number]  HPI  Patient presents to the office today for 6 month f/u of chronic medical conditions:  Osteoporosis:  Patient had a compression fracture of the spine in December 2013.  Patient was treated with cement injection into the affected vertebra.  Patient states that she has done well since then.  She is taking Actonel and denies any adverse effects.  Adenocarcinoma of the lung:  Lung nodule found in the right lower lobe.  Patient was treated with 5 treatments of radiation therapy.  Patient reports doing well with the radiation, and is due back to see the rad onc on January 12, 2013.    Diabetes:  Patient is being followed by Dr. Lafayette Dragon.   Blood sugar ranges anywhere from 50-300.  She reports no adverse effects to medications.    Thyroid nodule:  Is also being followed by Dr. Lafayette Dragon.  Was biopsied in Nov. 2012 and was not found to be malignant.    GERD:  Is having increased belching and dyspepsia after every meal.  Started at the same time as her radiation therapy.  She has not tried taking any medications at home at this time.    CHF:  Patient states that she is doing well.  Denies PND, orthopnea, and chest pain.  Does admit to mild DOE and occasional swelling of the ankles and legs.  Past Medical History  Diagnosis Date  . Arthritis   . Hyperlipidemia   . 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 Closed fracture of unspecified part of femur 2008  . TIA (transient ischemic attack)   . Edema   . Chronic diastolic heart failure   . Osteoporosis with fracture     compression fx lower T spine and t4  . Thyroid nodule     dominate R nodule, s/p aspiration>abn path>6 mo obs planned (gerkin 06/01/11)  . GERD (gastroesophageal reflux disease)   . Hypertension 2006  . Diabetes mellitus, type II, insulin dependent 08-15-12    over 40 years ago  . Atrial fibrillation     chronic  anticoag; and AFlutter  . Adenocarcinoma, lung dx 11/08/12    CT chest bx - XRT ongoing       Family History  Problem Relation Age of Onset  . Hypertension Father     Parent not sure which 1  . Diabetes Brother   . Lung cancer Other   . Cancer Sister   . Parkinsonism Sister       Review of Systems  Constitutional: Negative for fever, chills and fatigue.  Respiratory: Positive for shortness of breath. Negative for cough, chest tightness and wheezing.   Cardiovascular: Positive for leg swelling. Negative for chest pain and palpitations.  Gastrointestinal: Negative for nausea, vomiting, abdominal pain and blood in stool.  All other systems reviewed and are negative.       Objective:   Physical Exam  Nursing note and vitals reviewed. Constitutional: She is oriented to person, place, and time. She appears well-developed and well-nourished. No distress.  HENT:  Head: Normocephalic and atraumatic.  Neck: Normal range of motion. Neck supple. No thyromegaly present.  Cardiovascular: Normal rate, regular rhythm and normal heart sounds.  Exam reveals no gallop and no friction rub.   No murmur heard. Pulmonary/Chest: Effort normal and breath sounds normal. No respiratory distress. She has no wheezes. She has no rales.  She exhibits no tenderness.  Lymphadenopathy:    She has no cervical adenopathy.  Neurological: She is alert and oriented to person, place, and time.  Skin: Skin is warm and dry. She is not diaphoretic.  Psychiatric: She has a normal mood and affect. Her behavior is normal.   Filed Vitals:   12/19/12 1045  BP: 110/60  Pulse: 58  Temp: 97.8 F (36.6 C)  TempSrc: Oral  Weight: 124 lb 12.8 oz (56.609 kg)  SpO2: 97%   Wt Readings from Last 3 Encounters:  12/19/12 124 lb 12.8 oz (56.609 kg)  11/10/12 122 lb (55.339 kg)  11/10/12 122 lb (55.339 kg)   BP Readings from Last 3 Encounters:  12/19/12 110/60  11/10/12 103/59  11/10/12 103/59   Lab Results   Component Value Date   WBC 5.3 11/08/2012   HGB 12.1 11/08/2012   HCT 36.6 11/08/2012   PLT 172 11/08/2012   GLUCOSE 267* 07/30/2012   CHOL 135 06/20/2012   TRIG 86.0 06/20/2012   HDL 59.90 06/20/2012   LDLCALC 58 06/20/2012   ALT 16 07/30/2012   AST 24 07/30/2012   NA 124* 07/30/2012   K 3.8 07/30/2012   CL 85* 07/30/2012   CREATININE 0.95 07/30/2012   BUN 19 07/30/2012   CO2 29 07/30/2012   TSH 1.98 08/24/2011   INR 1.4 12/14/2012   HGBA1C 9.0* 03/04/2012          Assessment & Plan:  Pleasant 79 y.o women presents to the office today for 6 mo. F/u of chronic medical conditions.  Past medical history and interval events reviewed today.  1.  Osteoporosis:  Patient reports doing well after vertebral compression fracture.  Patient to continue taking Actonel as currently prescribed.  Last DXA was done in 2012; however, at this point another DXA will not change the management of this patient at this time.    2.  Adenocarcinoma of the lung:  T2N0M0 lung nodule which was treated with radiation therapy.  Is being followed by Dr. Sherene Sires.  Is due for an appointment on 01/12/13.  No changes in therapy are recommended at this time.  3.  Diabetes:  Patient is currently being followed by Endocrinologist Dr. Lafayette Dragon.  We have reviewed his notes.  4.  Thyroid nodule:  Also being followed by Dr. Lafayette Dragon.  His notes were reviewed.  5.  GERD:  Patient instructed to take Tums.  If this does not work she can try taking Omeprazole.  If symptoms do not resolve she should call the office and schedule another appointment.  6.  CHF:  Patient doing well at this time.  No changes in current therapy are recommended.  Patient to return to the office in 6 mo, and to return sooner if problems.     I have personally reviewed this case with PA student. I also personally examined this patient. I agree with history and findings as documented above. I reviewed, discussed and approve of the assessment and plan as listed above.  Rene Paci, MD

## 2012-12-19 NOTE — Assessment & Plan Note (Signed)
Fall 2012 aspiration by endocrine (Dr. Sharl Ma) Eval by Dr. Gerrit Friends October 22 for same> discussed total thyroidectomy versus observation: observation and six-month followup US planned Will defer follow up to endo as needed in light of lung ca

## 2012-12-19 NOTE — Patient Instructions (Signed)
It was good to see you today. We have reviewed your prior records including labs and tests today Medications reviewed and updated, no changes recommended at this time. Please schedule followup in 6 months, call sooner if problems. Continue working with your other specialists as ongoing

## 2012-12-19 NOTE — Progress Notes (Signed)
  Subjective:    Patient ID: CHANLEY MCENERY, female    DOB: 14-Apr-1926, 79 y.o.   MRN: [id number]  HPI  Here for follow up - reviewed chronic interval events/history See PA student note -  Past Medical History  Diagnosis Date  . Arthritis   . Hyperlipidemia   . 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 Closed fracture of unspecified part of femur 2008  . TIA (transient ischemic attack)   . Edema   . Chronic diastolic heart failure   . Osteoporosis with fracture     compression fx lower T spine and t4  . Thyroid nodule     dominate R nodule, s/p aspiration>abn path>6 mo obs planned ([facility]kin 06/01/11)  . GERD (gastroesophageal reflux disease)   . Hypertension 2006  . Diabetes mellitus, type II, insulin dependent 08-15-12    over 40 years ago  . Atrial fibrillation     chronic anticoag; and AFlutter    Review of Systems  Respiratory: Negative for cough and shortness of breath.   Cardiovascular: Negative for chest pain and palpitations.  Gastrointestinal: Positive for nausea. Negative for vomiting, diarrhea, constipation, blood in stool, abdominal distention and anal bleeding.       Objective:   Physical Exam  BP 110/60  Pulse 58  Temp(Src) 97.8 F (36.6 C) (Oral)  Wt 124 lb 12.8 oz (56.609 kg)  BMI 25.19 kg/m2  SpO2 97% Wt Readings from Last 3 Encounters:  12/19/12 124 lb 12.8 oz (56.609 kg)  11/10/12 122 lb (55.339 kg)  11/10/12 122 lb (55.339 kg)   Constitutional: She appears very uncomfortable sitting in WC but well-developed and well-nourished. Dtr and son at side Neck: Normal range of motion. Neck supple. No JVD present. No thyromegaly present.  Cardiovascular: Normal rate, regular rhythm and normal heart sounds.  No murmur heard. No BLE edema. Pulmonary/Chest: Effort normal and breath sounds normal. No respiratory distress. She has no wheezes.  Psychiatric: She has a normal mood and affect. Her behavior is normal. Judgment and thought  content normal.   Lab Results  Component Value Date   WBC 5.3 11/08/2012   HGB 12.1 11/08/2012   HCT 36.6 11/08/2012   PLT 172 11/08/2012   GLUCOSE 267* 07/30/2012   CHOL 135 06/20/2012   TRIG 86.0 06/20/2012   HDL 59.90 06/20/2012   LDLCALC 58 06/20/2012   ALT 16 07/30/2012   AST 24 07/30/2012   NA 124* 07/30/2012   K 3.8 07/30/2012   CL 85* 07/30/2012   CREATININE 0.95 07/30/2012   BUN 19 07/30/2012   CO2 29 07/30/2012   TSH 1.98 08/24/2011   INR 1.4 12/14/2012   HGBA1C 9.0* 03/04/2012       Assessment & Plan:   See problem list. Medications and labs reviewed today.  Time spent with pt/family today 25 minutes, greater than 50% time spent counseling patient on GERD, lung ca, thyroid nodule and medication review. Also review of prior records

## 2012-12-28 ENCOUNTER — Ambulatory Visit (INDEPENDENT_AMBULATORY_CARE_PROVIDER_SITE_OTHER): Payer: Medicare Other | Admitting: General Practice

## 2012-12-28 DIAGNOSIS — G459 Transient cerebral ischemic attack, unspecified: Secondary | ICD-10-CM

## 2012-12-28 DIAGNOSIS — I4891 Unspecified atrial fibrillation: Secondary | ICD-10-CM

## 2012-12-28 DIAGNOSIS — Z7901 Long term (current) use of anticoagulants: Secondary | ICD-10-CM

## 2013-01-01 NOTE — Progress Notes (Signed)
  Radiation Oncology         [phone] ________________________________  Name: GLEMA TAKAKI MRN: [id number]  Date: 12/09/2012  DOB: August 15, 1925  End of Treatment Note  Diagnosis:   Lung cancer     Indication for treatment:  Curative       Radiation treatment dates:   11/30/2012 through 12/09/2012  Site/dose:   The patient was treated to a dose of 50 gray to the planning target volume in 5 fractions. This corresponded to a course of stereotactic body radiotherapy to the right lung tumor. The ITV receive 60 gray in 5 fractions. The patient's treatment was carried out using to IMRT treatment fields.  Narrative: The patient tolerated radiation treatment relatively well.   The patient did not exhibit any difficulties with treatment/no acute toxicity. No worsening shortness of breath.  Plan: The patient has completed radiation treatment. The patient will return to radiation oncology clinic for routine followup in one month. I advised the patient to call or return sooner if they have any questions or concerns related to their recovery or treatment. ________________________________  Radene Gunning, M.D., Ph.D.

## 2013-01-01 NOTE — Progress Notes (Signed)
  Radiation Oncology         [phone] ________________________________  Name: Wendy Hopkins MRN: [id number]  Date: 12/02/2012  DOB: Apr 20, 1926   Simulation verification note  The patient underwent film verification for the patient's set-up in preparation for stereotactic body radiosurgery. The patient was placed on the treatment unit and a CT scan was performed. These images were then fused with the patient's planning CT scan. The fusion was carefully reviewed in terms of the patient's anatomy as it related to the planning CT scan. The target structures as well as the organs at risk were evaluated on the patient's treatment CT scan. The target and the normal structures were appropriately aligned for treatment. Therefore the patient proceeded with the fraction of stereotactic body radiosurgery.  Fraction: 2  Dose:  20 Gy (PTV)   ________________________________  Radene Gunning, MD, PhD

## 2013-01-01 NOTE — Progress Notes (Signed)
  Radiation Oncology         [phone] ________________________________  Name: Wendy Hopkins MRN: [id number]  Date: 11/23/2012  DOB: 08-Oct-1925  RESPIRATORY MOTION MANAGEMENT SIMULATION  NARRATIVE:  In order to account for effect of respiratory motion on target structures and other organs in the planning and delivery of radiotherapy, this patient underwent respiratory motion management simulation.  To accomplish this, when the patient was brought to the CT simulation planning suite, 4D respiratoy motion management CT images were obtained.  The CT images were loaded into the planning software.  Then, using a variety of tools including Cine, MIP, and standard views, the target volume and planning target volumes (PTV) were delineated.  Avoidance structures were contoured.  Treatment planning then occurred.  Dose volume histograms were generated and reviewed for each of the requested structure.  The resulting plan was carefully reviewed and approved today.   ------------------------------------------------  Radene Gunning, MD, PhD

## 2013-01-04 ENCOUNTER — Other Ambulatory Visit: Payer: Self-pay | Admitting: *Deleted

## 2013-01-04 MED ORDER — WARFARIN SODIUM 5 MG PO TABS: 2.5000 mg | ORAL_TABLET | Freq: Every day | ORAL | Status: DC

## 2013-01-09 ENCOUNTER — Other Ambulatory Visit: Payer: Self-pay | Admitting: Internal Medicine

## 2013-01-09 DIAGNOSIS — E049 Nontoxic goiter, unspecified: Secondary | ICD-10-CM

## 2013-01-10 ENCOUNTER — Encounter: Payer: Self-pay | Admitting: Oncology

## 2013-01-11 ENCOUNTER — Ambulatory Visit
Admission: RE | Admit: 2013-01-11 | Discharge: 2013-01-11 | Disposition: A | Discharge status: Home / Self Care | Payer: Medicare Other | Source: Ambulatory Visit | Attending: Radiation Oncology | Admitting: Radiation Oncology

## 2013-01-11 VITALS — BP 119/53 | HR 66 | Temp 97.4°F | Ht 59.0 in | Wt 126.2 lb

## 2013-01-11 DIAGNOSIS — C3491 Malignant neoplasm of unspecified part of right bronchus or lung: Secondary | ICD-10-CM

## 2013-01-11 NOTE — Progress Notes (Signed)
Wendy Hopkins here with her daughter for follow up after receiving 50 gray to her right lung.  She does have pain that she rates at a 4/10 in her right side that radiates to the center of her chest.  She is fatigued and short of breath after walks.  She denies nausea.  She states her skin is intact.  She did have some itching after treatment which has improved.

## 2013-01-11 NOTE — Progress Notes (Signed)
Radiation Oncology         [phone]) [phone] ________________________________  Name: Wendy Hopkins MRN: [id number]  Date: 01/11/2013  DOB: 1926-01-15  Follow-Up Visit Note  CC: Rene Paci, MD  Delight Ovens, MD  Diagnosis:   Non-small cell lung cancer of the right lung status post stereotactic body radiotherapy  Interval Since Last Radiation:  One month   Narrative:  The patient returns today for routine follow-up.  The patient indicates that she well. The patient's states that she does have some shortness of breath but this has been a chronic issue for her. He states that she used to be able to walk 2-3 blocks as far back as 2008. No major change in shortness of breath according to the patient.  The patient denies esophagitis or skin changes.                        ALLERGIES:  is allergic to codeine.  Meds: Current Outpatient Prescriptions  Medication Sig Dispense Refill  . ACCU-CHEK AVIVA test strip Check twice daily      . ACTONEL 150 MG tablet Take 150 mg by mouth every 30 (thirty) days. Taken on the 1st of each month.      Marland Kitchen atenolol (TENORMIN) 25 MG tablet Take 0.5 tablets (12.5 mg total) by mouth daily.  30 tablet  1  . Cholecalciferol (VITAMIN D) 2000 UNITS CAPS Take 2,000 Units by mouth daily.        . furosemide (LASIX) 40 MG tablet Take 60 mg by mouth daily. TAKES 60 MG PER DAY- 1 1/2 TABS      . insulin glargine (LANTUS) 100 UNIT/ML injection Inject 8 Units into the skin daily.       . insulin lispro (HUMALOG) 100 UNIT/ML injection Inject 3 Units into the skin 3 (three) times daily before meals.      . Insulin Syringe-Needle U-100 (INSULIN SYRINGE 1CC/30GX5/16") 30G X 5/16" 1 ML MISC Use four times a day      . Lancets (FREESTYLE) lancets Use three times a day to check blood sugar      . rosuvastatin (CRESTOR) 5 MG tablet Take 1 tablet (5 mg total) by mouth daily.  90 tablet  1  . warfarin (COUMADIN) 5 MG tablet Take 2.5-5 mg by mouth daily. 1 tab daily except 0.5  tab on Wednesday       No current facility-administered medications for this encounter.    Physical Findings: The patient is in no acute distress. Patient is alert and oriented.  height is 4\' 11"  (1.499 m) and weight is 126 lb 3.2 oz (57.244 kg). Her temperature is 97.4 F (36.3 C). Her blood pressure is 119/53 and her pulse is 66. Her oxygen saturation is 100%. .   General: Well-developed, in no acute distress HEENT: Normocephalic, atraumatic Cardiovascular: Regular rate and rhythm Respiratory: Clear to auscultation bilaterally GI: Soft, nontender, normal bowel sounds Extremities: No edema present   Lab Findings: Lab Results  Component Value Date   WBC 5.3 11/08/2012   HGB 12.1 11/08/2012   HCT 36.6 11/08/2012   MCV 87.6 11/08/2012   PLT 172 11/08/2012     Radiographic Findings: No results found.  Impression:    The patient is doing well 1 month after completing her course of stereotactic body radiotherapy. No signs of ongoing acute toxicity. Her skin look good in the treatment area.  Plan:  We will get a followup CT scan of  the chest in 3 months, then followup appointment.   Radene Gunning, M.D., Ph.D.

## 2013-01-12 ENCOUNTER — Ambulatory Visit: Payer: Medicare Other | Admitting: Radiation Oncology

## 2013-01-16 ENCOUNTER — Ambulatory Visit
Admission: RE | Admit: 2013-01-16 | Discharge: 2013-01-16 | Disposition: A | Discharge status: Home / Self Care | Payer: Medicare Other | Source: Ambulatory Visit | Attending: Internal Medicine | Admitting: Internal Medicine

## 2013-01-16 DIAGNOSIS — E049 Nontoxic goiter, unspecified: Secondary | ICD-10-CM

## 2013-01-25 ENCOUNTER — Ambulatory Visit (INDEPENDENT_AMBULATORY_CARE_PROVIDER_SITE_OTHER): Payer: Medicare Other | Admitting: General Practice

## 2013-01-25 DIAGNOSIS — Z7901 Long term (current) use of anticoagulants: Secondary | ICD-10-CM

## 2013-01-25 DIAGNOSIS — G459 Transient cerebral ischemic attack, unspecified: Secondary | ICD-10-CM

## 2013-01-25 DIAGNOSIS — I4891 Unspecified atrial fibrillation: Secondary | ICD-10-CM

## 2013-01-31 ENCOUNTER — Ambulatory Visit (INDEPENDENT_AMBULATORY_CARE_PROVIDER_SITE_OTHER): Payer: Medicare Other | Admitting: General Practice

## 2013-01-31 DIAGNOSIS — G459 Transient cerebral ischemic attack, unspecified: Secondary | ICD-10-CM

## 2013-01-31 DIAGNOSIS — I4891 Unspecified atrial fibrillation: Secondary | ICD-10-CM

## 2013-01-31 DIAGNOSIS — Z7901 Long term (current) use of anticoagulants: Secondary | ICD-10-CM

## 2013-02-01 ENCOUNTER — Ambulatory Visit (INDEPENDENT_AMBULATORY_CARE_PROVIDER_SITE_OTHER): Payer: Medicare Other | Admitting: Internal Medicine

## 2013-02-01 ENCOUNTER — Ambulatory Visit (INDEPENDENT_AMBULATORY_CARE_PROVIDER_SITE_OTHER)
Admission: RE | Admit: 2013-02-01 | Discharge: 2013-02-01 | Disposition: A | Discharge status: Home / Self Care | Payer: Medicare Other | Source: Ambulatory Visit | Attending: Internal Medicine | Admitting: Internal Medicine

## 2013-02-01 ENCOUNTER — Encounter: Payer: Self-pay | Admitting: Internal Medicine

## 2013-02-01 VITALS — BP 118/62 | HR 65 | Temp 98.1°F | Wt 124.0 lb

## 2013-02-01 DIAGNOSIS — C3490 Malignant neoplasm of unspecified part of unspecified bronchus or lung: Secondary | ICD-10-CM

## 2013-02-01 DIAGNOSIS — R0789 Other chest pain: Secondary | ICD-10-CM

## 2013-02-01 DIAGNOSIS — R071 Chest pain on breathing: Secondary | ICD-10-CM

## 2013-02-01 DIAGNOSIS — M810 Age-related osteoporosis without current pathological fracture: Secondary | ICD-10-CM

## 2013-02-01 MED ORDER — ACETAMINOPHEN 500 MG PO TABS: 500.0000 mg | ORAL_TABLET | Freq: Four times a day (QID) | ORAL | Status: DC | PRN

## 2013-02-01 NOTE — Assessment & Plan Note (Signed)
Dx clarified 11/08/12 with CT chest bx Not surgical candidate - s/p 5 XRT tx thru 12/2012 - following with rad onc and pulm Interval hx reviewed

## 2013-02-01 NOTE — Progress Notes (Signed)
  Subjective:    Patient ID: Wendy Hopkins, female    DOB: 13-Mar-1926, 79 y.o.   MRN: [id number]  HPI  complains of anterior chest pain Onset 2 weeks ago Precipitated by "pull" (mild direct trauma) during positioning for recent DEXA Pain exacerbated by movement of arms, deep inspiration or direct pressure Pain improved but not resolved with tylenol and warm compress Pain 7/10, unchanged since onset  Past Medical History  Diagnosis Date  . Arthritis   . Hyperlipidemia   . 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 Closed fracture of unspecified part of femur 2008  . TIA (transient ischemic attack)   . Edema   . Chronic diastolic heart failure   . Osteoporosis with fracture     compression fx lower T spine and t4  . Thyroid nodule     dominate R nodule, s/p aspiration>abn path>6 mo obs planned ([facility] 06/01/11)  . GERD (gastroesophageal reflux disease)   . Hypertension 2006  . Diabetes mellitus, type II, insulin dependent 08-15-12    over 40 years ago  . Atrial fibrillation     chronic anticoag; and AFlutter  . Adenocarcinoma, lung dx 11/08/12    CT chest bx - XRT ongoing  . History of radiation therapy 11/30/2012-12/09/2012    50 gray to right chest    Review of Systems  Constitutional: Negative for fever and fatigue.  Respiratory: Negative for cough and shortness of breath.   Cardiovascular: Negative for palpitations and leg swelling.  Musculoskeletal: Negative for back pain.       Objective:   Physical Exam BP 118/62  Pulse 65  Temp(Src) 98.1 F (36.7 C) (Oral)  Wt 124 lb (56.246 kg)  BMI 25.03 kg/m2  SpO2 96% Wt Readings from Last 3 Encounters:  02/01/13 124 lb (56.246 kg)  01/11/13 126 lb 3.2 oz (57.244 kg)  12/19/12 124 lb 12.8 oz (56.609 kg)   Gen: NAD at rest, dtr at side Lungs: CTA B, no W/C CV: RRR, no edema BLE MSkel: tender to palpation over sternum, no gross deformity observed Skin: no rash or bruise      Assessment &  Plan:   Anterior chest pain following mild trauma Osteoporosis with hx fracture Lung cancer, s/p XRT to R chest  Check CXR r/o fracture or met continue symptomatic care - tylenol/warm compress

## 2013-02-01 NOTE — Patient Instructions (Signed)
It was good to see you today. Test(s) ordered today. Your results will be released to MyChart (or called to you) after review, usually within 72hours after test completion. If any changes need to be made, you will be notified at that same time. Continue Tylenol every 6 hours as needed and warm heating pad as needed

## 2013-02-02 NOTE — Assessment & Plan Note (Signed)
Vertebral compression fx 07/2012 s/p KP Continue actonel

## 2013-02-21 ENCOUNTER — Ambulatory Visit (INDEPENDENT_AMBULATORY_CARE_PROVIDER_SITE_OTHER): Payer: Medicare Other | Admitting: General Practice

## 2013-02-21 DIAGNOSIS — G459 Transient cerebral ischemic attack, unspecified: Secondary | ICD-10-CM

## 2013-02-21 DIAGNOSIS — I4891 Unspecified atrial fibrillation: Secondary | ICD-10-CM

## 2013-02-21 DIAGNOSIS — Z7901 Long term (current) use of anticoagulants: Secondary | ICD-10-CM

## 2013-02-21 LAB — POCT INR: INR: 3.2

## 2013-03-21 ENCOUNTER — Ambulatory Visit (INDEPENDENT_AMBULATORY_CARE_PROVIDER_SITE_OTHER): Payer: Medicare Other | Admitting: General Practice

## 2013-03-21 DIAGNOSIS — G459 Transient cerebral ischemic attack, unspecified: Secondary | ICD-10-CM

## 2013-03-21 DIAGNOSIS — Z7901 Long term (current) use of anticoagulants: Secondary | ICD-10-CM

## 2013-03-21 DIAGNOSIS — I4891 Unspecified atrial fibrillation: Secondary | ICD-10-CM

## 2013-03-21 LAB — POCT INR: INR: 2.4

## 2013-04-06 ENCOUNTER — Telehealth: Payer: Self-pay | Admitting: *Deleted

## 2013-04-06 NOTE — Telephone Encounter (Signed)
CALLED PATIENT TO ASK ABOUT COMING FOR STAT LABS ON 04-13-13, SHE SAID THAT SHE WOULD BE  WILLING TO COME.

## 2013-04-13 ENCOUNTER — Ambulatory Visit
Admission: RE | Admit: 2013-04-13 | Discharge: 2013-04-13 | Disposition: A | Discharge status: Home / Self Care | Payer: Medicare Other | Source: Ambulatory Visit | Attending: Radiation Oncology | Admitting: Radiation Oncology

## 2013-04-13 ENCOUNTER — Ambulatory Visit (HOSPITAL_COMMUNITY)
Admission: RE | Admit: 2013-04-13 | Discharge: 2013-04-13 | Disposition: A | Discharge status: Home / Self Care | Payer: Medicare Other | Source: Ambulatory Visit | Attending: Radiation Oncology | Admitting: Radiation Oncology

## 2013-04-13 ENCOUNTER — Encounter (HOSPITAL_COMMUNITY): Payer: Self-pay

## 2013-04-13 DIAGNOSIS — C3491 Malignant neoplasm of unspecified part of right bronchus or lung: Secondary | ICD-10-CM

## 2013-04-13 DIAGNOSIS — Z9221 Personal history of antineoplastic chemotherapy: Secondary | ICD-10-CM | POA: Insufficient documentation

## 2013-04-13 DIAGNOSIS — M479 Spondylosis, unspecified: Secondary | ICD-10-CM | POA: Insufficient documentation

## 2013-04-13 DIAGNOSIS — J90 Pleural effusion, not elsewhere classified: Secondary | ICD-10-CM | POA: Insufficient documentation

## 2013-04-13 DIAGNOSIS — Z923 Personal history of irradiation: Secondary | ICD-10-CM | POA: Insufficient documentation

## 2013-04-13 DIAGNOSIS — C3490 Malignant neoplasm of unspecified part of unspecified bronchus or lung: Secondary | ICD-10-CM | POA: Insufficient documentation

## 2013-04-13 DIAGNOSIS — S2220XA Unspecified fracture of sternum, initial encounter for closed fracture: Secondary | ICD-10-CM | POA: Insufficient documentation

## 2013-04-13 DIAGNOSIS — X58XXXA Exposure to other specified factors, initial encounter: Secondary | ICD-10-CM | POA: Insufficient documentation

## 2013-04-13 LAB — BUN AND CREATININE (CC13): Creatinine: 1 mg/dL (ref 0.6–1.1)

## 2013-04-13 MED ORDER — IOHEXOL 300 MG/ML  SOLN
80.0000 mL | Freq: Once | INTRAMUSCULAR | Status: AC | PRN
  Administered 2013-04-13: 80 mL via INTRAVENOUS

## 2013-04-19 ENCOUNTER — Ambulatory Visit (INDEPENDENT_AMBULATORY_CARE_PROVIDER_SITE_OTHER): Payer: Medicare Other | Admitting: General Practice

## 2013-04-19 DIAGNOSIS — Z7901 Long term (current) use of anticoagulants: Secondary | ICD-10-CM

## 2013-04-19 DIAGNOSIS — I4891 Unspecified atrial fibrillation: Secondary | ICD-10-CM

## 2013-04-19 DIAGNOSIS — G459 Transient cerebral ischemic attack, unspecified: Secondary | ICD-10-CM

## 2013-04-19 LAB — POCT INR: INR: 2.3

## 2013-04-20 ENCOUNTER — Encounter: Payer: Self-pay | Admitting: Radiation Oncology

## 2013-04-20 ENCOUNTER — Other Ambulatory Visit: Payer: Self-pay | Admitting: Internal Medicine

## 2013-04-20 ENCOUNTER — Ambulatory Visit
Admission: RE | Admit: 2013-04-20 | Discharge: 2013-04-20 | Disposition: A | Discharge status: Home / Self Care | Payer: Medicare Other | Source: Ambulatory Visit | Attending: Radiation Oncology | Admitting: Radiation Oncology

## 2013-04-20 VITALS — BP 138/71 | HR 80 | Temp 97.9°F | Resp 20 | Wt 123.2 lb

## 2013-04-20 DIAGNOSIS — C3491 Malignant neoplasm of unspecified part of right bronchus or lung: Secondary | ICD-10-CM

## 2013-04-20 NOTE — Progress Notes (Signed)
Follow up s/p rad tx s tight lung 11/30/12-12/09/12., CT chest 04/13/13 results in, occasional dry cough, no c/o pain , no nausea, appetite good, sob with exertion, 99% room air sats,

## 2013-04-21 DIAGNOSIS — C3430 Malignant neoplasm of lower lobe, unspecified bronchus or lung: Secondary | ICD-10-CM | POA: Insufficient documentation

## 2013-04-21 NOTE — Progress Notes (Signed)
Radiation Oncology         [phone] ________________________________  Name: Wendy Hopkins MRN: [id number]  Date: 04/20/2013  DOB: 24-Jul-1926  Follow-Up Visit Note  CC: Rene Paci, MD  Delight Ovens, MD  Diagnosis:   Non-small cell lung cancer status post SBRT  Interval Since Last Radiation:  4 months   Narrative:  The patient returns today for routine follow-up.  The patient indicates that she has been doing relatively well. The patient has an occasional dry cough. No worsening shortness of breath although she does describe some shortness of breath with exertion. The patient describes some pain in the central chest at her sternum and she states that she is aware that she fractured her sternum before. This has been reviewed with her primary care physician and is managed with pain medications at this time. The patient and her family are not interested in any other potential options.      She had a recent CT scan of the chest and returns today for review.                         ALLERGIES:  is allergic to codeine.  Meds: Current Outpatient Prescriptions  Medication Sig Dispense Refill  . acetaminophen (TYLENOL) 500 MG tablet Take 1 tablet (500 mg total) by mouth every 6 (six) hours as needed for pain or fever.  30 tablet  0  . atenolol (TENORMIN) 25 MG tablet Take 0.5 tablets (12.5 mg total) by mouth daily.  30 tablet  1  . atorvastatin (LIPITOR) 10 MG tablet Take 10 mg by mouth daily.      . Cholecalciferol (VITAMIN D) 2000 UNITS CAPS Take 2,000 Units by mouth daily.        . furosemide (LASIX) 40 MG tablet Take 60 mg by mouth daily. TAKES 60 MG PER DAY- 1 1/2 TABS      . furosemide (LASIX) 40 MG tablet TAKE 1 AND 1/2 TABLETS BY MOUTH DAILY  30 tablet  0  . insulin lispro (HUMALOG) 100 UNIT/ML injection Inject 3 Units into the skin 3 (three) times daily before meals.      . Insulin Syringe-Needle U-100 (INSULIN SYRINGE 1CC/30GX5/16") 30G X 5/16" 1 ML MISC Use four times  a day      . Lancets (FREESTYLE) lancets Use three times a day to check blood sugar      . ACCU-CHEK AVIVA test strip Check twice daily      . ACTONEL 150 MG tablet Take 150 mg by mouth every 30 (thirty) days. Taken on the 1st of each month.      . insulin glargine (LANTUS) 100 UNIT/ML injection Inject 8 Units into the skin daily.       Marland Kitchen warfarin (COUMADIN) 5 MG tablet Take 2.5-5 mg by mouth daily. 1 tab daily except 0.5 tab on Wednesday       No current facility-administered medications for this encounter.    Physical Findings: The patient is in no acute distress. Patient is alert and oriented.  weight is 123 lb 3.2 oz (55.883 kg). Her oral temperature is 97.9 F (36.6 C). Her blood pressure is 138/71 and her pulse is 80. Her respiration is 20 and oxygen saturation is 99%. .   General: Well-developed, in no acute distress HEENT: Normocephalic, atraumatic Cardiovascular: Regular rate and rhythm Respiratory: Clear to auscultation bilaterally GI: Soft, nontender, normal bowel sounds Extremities: No edema present   Lab Findings: Lab  Results  Component Value Date   WBC 5.3 11/08/2012   HGB 12.1 11/08/2012   HCT 36.6 11/08/2012   MCV 87.6 11/08/2012   PLT 172 11/08/2012     Radiographic Findings: Ct Chest W Contrast  04/13/2013   *RADIOLOGY REPORT*  Clinical Data: Right-sided lung cancer status post radiotherapy  CT CHEST WITH CONTRAST  Technique:  Multidetector CT imaging of the chest was performed following the standard protocol during bolus administration of intravenous contrast.  Contrast: 80mL OMNIPAQUE IOHEXOL 300 MG/ML  SOLN  Comparison: 08/22/2012  Findings: There is a small to moderate right pleural effusion. Interval development of consolidation and ground-glass attenuation within the right lower lobe compatible with changes of external beam radiation.  The index lesion is identified within this area measuring 1.8 x 3.3 cm, image 24/series 5.  The left lung appears clear.  The heart  size appears normal.  No pericardial effusion.  Index right paratracheal lymph node measures 7 mm, image eight/series 2. This is unchanged from previous exam. No enlarged axillary or supraclavicular lymph nodes.  The thyroid gland has a heterogeneous appearance there is a nodule in the right lobe measuring 1.9 cm, image 3/series 2.  Incidental imaging through the upper abdomen shows no acute findings.  The adrenal glands both appear within normal limits.  Review of the visualized osseous structures is significant for mild multilevel spondylosis.   Treated compression deformity at the T12 level is identified. There is a stable vertebral plana deformity involving the T3 vertebra.  There is a displaced fracture deformity involving the body of the sternum.  This is new from the previous examination. Chronic appearing left posterior lateral rib fracture deformities are again identified.  There is new asymmetric area of sclerosis involving the right fifth rib, image number 20/series 5.   IMPRESSION:  1.  Interval decrease in size of the right lung mass status post external beam radiation. 2.  Interval development of ground-glass attenuation and airspace consolidation in the left lower lobe consistent with radiation change. 3.  There is a new fracture involving the body of the sternum. This is of uncertain etiology and may be post-traumatic. Underlying pathologic lesion is not excluded. 4.  New sclerosis involving right fifth rib is of uncertain significance.  Cannot rule out an area of bone metastasis. Consider further evaluation with either PET-CT or nuclear medicine bone scan.   Original Report Authenticated By: Signa Kell, M.D.    Impression:    The patient is doing satisfactorily clinically after her course of treatment. Patient's CT scan looked okay in the area of treatment. She does have a rash or of the sternum and we discussed this and they want to proceed with pain management. She does also have an area of  sclerosis in the right rib laterally. On questioning, the patient does describe some pain in this area. This was of uncertain significance.  Plan:  We will proceed with a PET scan for further evaluation of the CT scan findings. The patient wishes to pursue further workup of the issues which were discussed. She will then return to clinic to review this.  I spent 15 minutes with the patient today, the majority of which was spent counseling the patient on the diagnosis of cancer and coordinating care.   Radene Gunning, M.D., Ph.D.

## 2013-04-27 ENCOUNTER — Other Ambulatory Visit: Payer: Self-pay | Admitting: Internal Medicine

## 2013-05-01 ENCOUNTER — Other Ambulatory Visit (HOSPITAL_COMMUNITY): Payer: Medicare Other

## 2013-05-01 ENCOUNTER — Encounter (HOSPITAL_COMMUNITY): Payer: Self-pay

## 2013-05-01 ENCOUNTER — Encounter (HOSPITAL_COMMUNITY)
Admission: RE | Admit: 2013-05-01 | Discharge: 2013-05-01 | Disposition: A | Discharge status: Home / Self Care | Payer: Medicare Other | Source: Ambulatory Visit | Attending: Radiation Oncology | Admitting: Radiation Oncology

## 2013-05-01 DIAGNOSIS — Z923 Personal history of irradiation: Secondary | ICD-10-CM | POA: Insufficient documentation

## 2013-05-01 DIAGNOSIS — C3490 Malignant neoplasm of unspecified part of unspecified bronchus or lung: Secondary | ICD-10-CM | POA: Insufficient documentation

## 2013-05-01 DIAGNOSIS — X58XXXA Exposure to other specified factors, initial encounter: Secondary | ICD-10-CM | POA: Insufficient documentation

## 2013-05-01 DIAGNOSIS — J90 Pleural effusion, not elsewhere classified: Secondary | ICD-10-CM | POA: Insufficient documentation

## 2013-05-01 DIAGNOSIS — S2220XA Unspecified fracture of sternum, initial encounter for closed fracture: Secondary | ICD-10-CM | POA: Insufficient documentation

## 2013-05-01 DIAGNOSIS — I2510 Atherosclerotic heart disease of native coronary artery without angina pectoris: Secondary | ICD-10-CM | POA: Insufficient documentation

## 2013-05-01 DIAGNOSIS — E041 Nontoxic single thyroid nodule: Secondary | ICD-10-CM | POA: Insufficient documentation

## 2013-05-01 DIAGNOSIS — M948X9 Other specified disorders of cartilage, unspecified sites: Secondary | ICD-10-CM | POA: Insufficient documentation

## 2013-05-01 LAB — GLUCOSE, CAPILLARY: Glucose-Capillary: 259 mg/dL — ABNORMAL HIGH (ref 70–99)

## 2013-05-01 MED ORDER — FLUDEOXYGLUCOSE F - 18 (FDG) INJECTION
18.3000 | Freq: Once | INTRAVENOUS | Status: AC | PRN
  Administered 2013-05-01: 18.3 via INTRAVENOUS

## 2013-05-04 ENCOUNTER — Ambulatory Visit
Admission: RE | Admit: 2013-05-04 | Discharge: 2013-05-04 | Disposition: A | Discharge status: Home / Self Care | Payer: Medicare Other | Source: Ambulatory Visit | Attending: Radiation Oncology | Admitting: Radiation Oncology

## 2013-05-04 ENCOUNTER — Telehealth: Payer: Self-pay | Admitting: *Deleted

## 2013-05-04 ENCOUNTER — Encounter: Payer: Self-pay | Admitting: Radiation Oncology

## 2013-05-04 NOTE — Telephone Encounter (Signed)
CALLED PATIENT TO INFORM OF TEST AND FU VISIT, SPOKE WITH PATIENT AND SHE IS AWARE OF THESE APPTS. 

## 2013-05-04 NOTE — Progress Notes (Signed)
Radiation Oncology         [phone] ________________________________  Name: Wendy Hopkins MRN: [id number]  Date: 05/04/2013  DOB: November 28, 1925  Follow-Up Visit Note  CC: Rene Paci, MD  Delight Ovens, MD  Diagnosis:   Non-small cell lung cancer status post SBRT  Interval Since Last Radiation:  5 months   Narrative:  The patient returns today for routine follow-up.  The patient had a PET scan to followup on some findings from her CT scan recently. She presents today for discussion of this. The PET scan showed no hypermetabolic activity associated with an area of sclerosis in a lateral right rib. Some surrounding inflammatory changes were seen in the right lower lobe mass indicative of radiation treatment. A small right-sided pleural effusion was noted and attention on followup exams was recommended. The patient has a known sternal fracture with mild hypermetabolic activity associate this area if she also had a hypermetabolic right thyroid nodule as before. She and her daughter indicate that this has been worked up and is currently being managed.                              ALLERGIES:  is allergic to codeine.  Meds: Current Outpatient Prescriptions  Medication Sig Dispense Refill  . ACCU-CHEK AVIVA test strip Check twice daily      . acetaminophen (TYLENOL) 500 MG tablet Take 1 tablet (500 mg total) by mouth every 6 (six) hours as needed for pain or fever.  30 tablet  0  . atenolol (TENORMIN) 25 MG tablet TAKE 1/2 TABLET BY MOUTH DAILY  30 tablet  0  . atorvastatin (LIPITOR) 10 MG tablet Take 10 mg by mouth daily.      . Cholecalciferol (VITAMIN D) 2000 UNITS CAPS Take 2,000 Units by mouth daily.        . furosemide (LASIX) 40 MG tablet Take 60 mg by mouth daily. TAKES 60 MG PER DAY- 1 1/2 TABS      . insulin glargine (LANTUS) 100 UNIT/ML injection Inject 8 Units into the skin daily.       . insulin lispro (HUMALOG) 100 UNIT/ML injection Inject 3 Units into the skin 3  (three) times daily before meals.      . Insulin Syringe-Needle U-100 (INSULIN SYRINGE 1CC/30GX5/16") 30G X 5/16" 1 ML MISC Use four times a day      . Lancets (FREESTYLE) lancets Use three times a day to check blood sugar      . warfarin (COUMADIN) 5 MG tablet Take 2.5-5 mg by mouth daily. 1 tab daily except 0.5 tab on Wednesday      . ACTONEL 150 MG tablet Take 150 mg by mouth every 30 (thirty) days. Taken on the 1st of each month.       No current facility-administered medications for this encounter.    Physical Findings: The patient is in no acute distress. Patient is alert and oriented.  weight is 124 lb 3.2 oz (56.337 kg). Her oral temperature is 97.8 F (36.6 C). Her blood pressure is 151/70 and her pulse is 66. Her respiration is 20 and oxygen saturation is 100%. .     Lab Findings: Lab Results  Component Value Date   WBC 5.3 11/08/2012   HGB 12.1 11/08/2012   HCT 36.6 11/08/2012   MCV 87.6 11/08/2012   PLT 172 11/08/2012     Radiographic Findings: Ct Chest W Contrast  04/13/2013   *  RADIOLOGY REPORT*  Clinical Data: Right-sided lung cancer status post radiotherapy  CT CHEST WITH CONTRAST  Technique:  Multidetector CT imaging of the chest was performed following the standard protocol during bolus administration of intravenous contrast.  Contrast: 80mL OMNIPAQUE IOHEXOL 300 MG/ML  SOLN  Comparison: 08/22/2012  Findings: There is a small to moderate right pleural effusion. Interval development of consolidation and ground-glass attenuation within the right lower lobe compatible with changes of external beam radiation.  The index lesion is identified within this area measuring 1.8 x 3.3 cm, image 24/series 5.  The left lung appears clear.  The heart size appears normal.  No pericardial effusion.  Index right paratracheal lymph node measures 7 mm, image eight/series 2. This is unchanged from previous exam. No enlarged axillary or supraclavicular lymph nodes.  The thyroid gland has a heterogeneous  appearance there is a nodule in the right lobe measuring 1.9 cm, image 3/series 2.  Incidental imaging through the upper abdomen shows no acute findings.  The adrenal glands both appear within normal limits.  Review of the visualized osseous structures is significant for mild multilevel spondylosis.   Treated compression deformity at the T12 level is identified. There is a stable vertebral plana deformity involving the T3 vertebra.  There is a displaced fracture deformity involving the body of the sternum.  This is new from the previous examination. Chronic appearing left posterior lateral rib fracture deformities are again identified.  There is new asymmetric area of sclerosis involving the right fifth rib, image number 20/series 5.   IMPRESSION:  1.  Interval decrease in size of the right lung mass status post external beam radiation. 2.  Interval development of ground-glass attenuation and airspace consolidation in the left lower lobe consistent with radiation change. 3.  There is a new fracture involving the body of the sternum. This is of uncertain etiology and may be post-traumatic. Underlying pathologic lesion is not excluded. 4.  New sclerosis involving right fifth rib is of uncertain significance.  Cannot rule out an area of bone metastasis. Consider further evaluation with either PET-CT or nuclear medicine bone scan.   Original Report Authenticated By: Signa Kell, M.D.   Nm Pet Image Restag (ps) Skull Base To Thigh  05/01/2013   *RADIOLOGY REPORT*  Clinical Data: Subsequent treatment strategy for lung cancer.  NUCLEAR MEDICINE PET SKULL BASE TO THIGH  Fasting Blood Glucose:  259  Technique:  18.3 mCi F-18 FDG was injected intravenously. CT data was obtained and used for attenuation correction and anatomic localization only.  (This was not acquired as a diagnostic CT examination.) Additional exam technical data entered on technologist worksheet.  Comparison:  CT chest 04/13/2013, CT abdomen pelvis  07/30/2012 and PET 10/18/2012.  Findings:  Neck: No hypermetabolic lymph nodes in the neck.  CT images show no acute findings.  Chest:  Although there is respiratory motion, the primary right lower lobe mass appears to measure 2.1 x 2.9 cm (previously 3.1 x 3.7 cm), with surrounding airspace opacification, presumably related to radiation therapy.  Collectively, S U V max is 4.2. Small right pleural effusion has increased in size, with vague activity measuring up to an S U V max of 4.5.  A 1.6 cm right thyroid nodule has an S U V max of 4.2, similar to the prior exam.  No additional areas of abnormal hypermetabolism in the chest.  CT images show no additional acute findings in the interval. Coronary artery calcification.  No pericardial effusion.  Abdomen/Pelvis:  No areas of abnormal hypermetabolism in the liver, pancreas, adrenal glands or spleen.  No hypermetabolic lymph nodes.  CT images show no acute findings.  There may be very minimal nodularity involving the left adrenal gland, unchanged from 07/30/2012.  Skeleton:  Focal sclerosis within the lateral aspect of the right fifth rib does not show associated hypermetabolism. A displaced sternal fracture has an S U V max of 3.7.  Probable enchondroma or bone infarcts in the proximal left femur.  Lower thoracic vertebroplasty.  Old left rib fractures.  IMPRESSION:  1.  Slight decrease in size of a right lower lobe mass with surrounding inflammatory changes, indicative of radiation therapy. 2.  Small right pleural effusion, enlarged slightly from 04/13/2013.  Faint associated hypermetabolism.  Attention on follow- up exams is warranted. 3.  Mild hypermetabolism associated with a sternal fracture.  A pathologic fracture cannot be excluded. 4.  No hypermetabolism associated with a sclerotic lesion in the right fifth rib. 5.  Hypermetabolic right thyroid nodule, as before.  Malignancy cannot be excluded.   Original Report Authenticated By: Leanna Battles, M.D.     Impression:    The PET scan looked fairly good overall. Your most concerned about the sclerotic lytic lesion and this was negative on the PET scan. We discussed the other findings as above.  Plan:   CT scan of the chest in 6 months, then followup appointment.     Radene Gunning, M.D., Ph.D.

## 2013-05-04 NOTE — Progress Notes (Signed)
F/u lung ca, patient had Pet scan 05/01/13, here for results, pain c/o pain  Moderate in back on right side thoracic area,  Last tylenol taken last night, none today appetite good, slow steady gait 1:41 PM

## 2013-05-10 NOTE — Addendum Note (Signed)
Encounter addended by: Janautica Netzley Scott Lion Fernandez, MD on: 05/10/2013 10:12 AM<BR>     Documentation filed: Visit Diagnoses

## 2013-05-17 ENCOUNTER — Ambulatory Visit (INDEPENDENT_AMBULATORY_CARE_PROVIDER_SITE_OTHER): Payer: Medicare Other | Admitting: General Practice

## 2013-05-17 DIAGNOSIS — G459 Transient cerebral ischemic attack, unspecified: Secondary | ICD-10-CM

## 2013-05-17 DIAGNOSIS — I4891 Unspecified atrial fibrillation: Secondary | ICD-10-CM

## 2013-05-17 DIAGNOSIS — Z7901 Long term (current) use of anticoagulants: Secondary | ICD-10-CM

## 2013-05-17 LAB — POCT INR: INR: 2.9

## 2013-05-20 ENCOUNTER — Other Ambulatory Visit: Payer: Self-pay | Admitting: Internal Medicine

## 2013-05-25 NOTE — Addendum Note (Signed)
Encounter addended by: Jonna Coup, MD on: 05/25/2013  8:21 PM<BR>     Documentation filed: Visit Diagnoses

## 2013-05-31 ENCOUNTER — Ambulatory Visit (INDEPENDENT_AMBULATORY_CARE_PROVIDER_SITE_OTHER): Payer: Medicare Other | Admitting: Internal Medicine

## 2013-05-31 ENCOUNTER — Ambulatory Visit (INDEPENDENT_AMBULATORY_CARE_PROVIDER_SITE_OTHER)
Admission: RE | Admit: 2013-05-31 | Discharge: 2013-05-31 | Disposition: A | Discharge status: Home / Self Care | Payer: Medicare Other | Source: Ambulatory Visit | Attending: Internal Medicine | Admitting: Internal Medicine

## 2013-05-31 ENCOUNTER — Encounter: Payer: Self-pay | Admitting: Internal Medicine

## 2013-05-31 VITALS — BP 138/70 | HR 85 | Temp 97.9°F | Wt 122.4 lb

## 2013-05-31 DIAGNOSIS — C3490 Malignant neoplasm of unspecified part of unspecified bronchus or lung: Secondary | ICD-10-CM

## 2013-05-31 DIAGNOSIS — S22009A Unspecified fracture of unspecified thoracic vertebra, initial encounter for closed fracture: Secondary | ICD-10-CM

## 2013-05-31 DIAGNOSIS — M549 Dorsalgia, unspecified: Secondary | ICD-10-CM

## 2013-05-31 DIAGNOSIS — S22060A Wedge compression fracture of T7-T8 vertebra, initial encounter for closed fracture: Secondary | ICD-10-CM

## 2013-05-31 DIAGNOSIS — M810 Age-related osteoporosis without current pathological fracture: Secondary | ICD-10-CM

## 2013-05-31 MED ORDER — TRAMADOL HCL 50 MG PO TABS: 50.0000 mg | ORAL_TABLET | Freq: Four times a day (QID) | ORAL | Status: DC | PRN

## 2013-05-31 MED ORDER — ONDANSETRON HCL 4 MG PO TABS: 4.0000 mg | ORAL_TABLET | Freq: Three times a day (TID) | ORAL | Status: DC | PRN

## 2013-05-31 NOTE — Assessment & Plan Note (Signed)
Vertebral compression fx 07/2012 s/p KP Prescribed Actonel early 2014, but none taken in past 3 months because no refills and cost prohibitive Remain off therapy at this time, but consider alternate therapy especially if recurrent compression fracture

## 2013-05-31 NOTE — Patient Instructions (Signed)
It was good to see you today.  We have reviewed your prior records including labs and tests today  Xray test(s) ordered today. Your results will be released to MyChart (or called to you) after review, usually within 72hours after test completion. If any changes need to be made, you will be notified at that same time.  We'll plan to make referral to MRI back and then, if needed, to specialist to consider kyphoplasty to treat compression fraction. Our office will contact you regarding appointment(s) once made.   Use tramadol as needed for pain with tylenol - Zofran if needed for nausea Your prescription(s) have been given to you and/or submitted to your pharmacy. Please take as directed and contact our office if you believe you are having problem(s) with the medication(s).  Followup in 6 weeks if pain symptoms unimproved, sooner for

## 2013-05-31 NOTE — Progress Notes (Signed)
Subjective:    Patient ID: Wendy Hopkins, female    DOB: 05-18-1926, 79 y.o.   MRN: [id number]  Back Pain This is a new problem. The current episode started in the past 7 days. The problem occurs constantly. The problem has been gradually worsening since onset. The pain is present in the lumbar spine. The quality of the pain is described as aching. The pain is at a severity of 4/10. The pain is moderate. Worse during: worse with movement. The symptoms are aggravated by position. Pertinent negatives include no abdominal pain, fever, leg pain, numbness or weakness. Risk factors include history of cancer and history of osteoporosis. She has tried analgesics and heat for the symptoms. The treatment provided no relief.   Pt was seen in the ER at [facility] on Monday.  Imaging was done, but the patient is unaware of the results.  Per pt, they advised follow up with PCP on same "because of my cancer".   Patient reports similar pain prior to T12 vertebroplasty that was done January 2014. She states this pain is more significant.   Past Medical History  Diagnosis Date  . Arthritis   . Hyperlipidemia   . 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 Closed fracture of unspecified part of femur 2008  . TIA (transient ischemic attack)   . Edema   . Chronic diastolic heart failure   . Osteoporosis with fracture     compression fx lower T spine and t4  . Thyroid nodule     dominate R nodule, s/p aspiration>abn path>6 mo obs planned (gerkin 06/01/11)  . GERD (gastroesophageal reflux disease)   . Hypertension 2006  . Atrial fibrillation     chronic anticoag; and AFlutter  . Adenocarcinoma, lung dx 11/08/12    CT chest bx - XRT ongoing  . History of radiation therapy 11/30/2012-12/09/2012    50 gray to right chest  . Diabetes mellitus, type II, insulin dependent 08-15-12    over 40 years ago     Review of Systems  Constitutional: Negative for fever, chills and appetite  change.  Gastrointestinal: Negative for abdominal pain.  Musculoskeletal: Positive for back pain.  Neurological: Negative for dizziness, weakness and numbness.       Objective:   Physical Exam  Vitals reviewed. Constitutional: She is oriented to person, place, and time. She appears well-developed and well-nourished. No distress.  Cardiovascular: Normal rate, regular rhythm and normal heart sounds.   Pulmonary/Chest: Effort normal. She has decreased breath sounds. She has no wheezes. She has no rhonchi.  Musculoskeletal:       Thoracic back: She exhibits pain and spasm. She exhibits no bony tenderness, no edema and no deformity.       Lumbar back: She exhibits decreased range of motion, tenderness and pain. She exhibits no spasm.  Neurological: She is alert and oriented to person, place, and time.  Skin: Skin is warm and dry. No rash noted. She is not diaphoretic.  Psychiatric: She has a normal mood and affect. Her behavior is normal. Judgment and thought content normal.       Assessment & Plan:   Acute back pain - hx of compression fracture 07/2012, s/p T12 vertebroplasty 08/2012.  Will obtain spine films today.  Tramadol and Zofran ordered today for pain/nausea control.  Will contact patient with results of spine films with further recommendations. Plan MRI lumbar spine to evaluate for recurrent compression fracture or facet/radicular disease causing acute pain symptoms  Also see problem list. Medication and interval testing reviewed

## 2013-05-31 NOTE — Assessment & Plan Note (Signed)
Dx clarified 11/08/12 with CT chest bx Not surgical candidate - s/p 5 XRT tx thru 12/2012 - following with rad onc and pulm Interval hx reviewed - PET 04/2013 reviewed

## 2013-06-01 ENCOUNTER — Telehealth: Payer: Self-pay | Admitting: *Deleted

## 2013-06-01 DIAGNOSIS — S22060A Wedge compression fracture of T7-T8 vertebra, initial encounter for closed fracture: Secondary | ICD-10-CM

## 2013-06-01 NOTE — Telephone Encounter (Signed)
MRI order done - Will also refer to interventional radiology for consideration of kyphoplasty after results back

## 2013-06-01 NOTE — Telephone Encounter (Signed)
Left message order sent

## 2013-06-01 NOTE — Telephone Encounter (Signed)
[name] called states pt's pain has not been relieved requesting an MRI.  Please advise

## 2013-06-06 ENCOUNTER — Ambulatory Visit
Admission: RE | Admit: 2013-06-06 | Discharge: 2013-06-06 | Disposition: A | Discharge status: Home / Self Care | Payer: Medicare Other | Source: Ambulatory Visit | Attending: Internal Medicine | Admitting: Internal Medicine

## 2013-06-06 DIAGNOSIS — S22060A Wedge compression fracture of T7-T8 vertebra, initial encounter for closed fracture: Secondary | ICD-10-CM

## 2013-06-07 ENCOUNTER — Telehealth: Payer: Self-pay | Admitting: General Practice

## 2013-06-07 ENCOUNTER — Other Ambulatory Visit: Payer: Self-pay | Admitting: General Practice

## 2013-06-07 MED ORDER — ENOXAPARIN SODIUM 80 MG/0.8ML ~~LOC~~ SOLN: 80.0000 mg | Freq: Every day | SUBCUTANEOUS | Status: DC

## 2013-06-07 NOTE — Telephone Encounter (Signed)
Instructions for coumadin and Lovenox pre and post procedure.  10/30 - Last dose of coumadin 10/31 - Nothing 11/1 - Lovenox in AM 11/2 - Lovenox in AM 11/3 - Lovenox in AM 11/4 - Nothing 11/5 - Lovenox in AM and 7.5 mg coumadin 11/6 - Lovenox in AM and 7.5 mg coumadin 11/7 - Lovenox in AM and 5 mg coumadin 11/8 - Lovenox in AM and 5 mg coumadin 11/9 - Lovenox in AM and 5 mg coumadin 11/10 - Lovenox in AM and 5 mg coumadin 11/11 - Re-check   Patient's daughter, Efraim Kaufmann verbalized understanding

## 2013-06-07 NOTE — Addendum Note (Signed)
Addended by: Rene Paci A on: 06/07/2013 07:52 AM   Modules accepted: Orders

## 2013-06-07 NOTE — Addendum Note (Signed)
Addended by: Rene Paci A on: 06/07/2013 09:14 AM   Modules accepted: Orders

## 2013-06-08 ENCOUNTER — Other Ambulatory Visit (HOSPITAL_COMMUNITY): Payer: Self-pay | Admitting: Interventional Radiology

## 2013-06-08 ENCOUNTER — Ambulatory Visit (HOSPITAL_COMMUNITY)
Admission: RE | Admit: 2013-06-08 | Discharge: 2013-06-08 | Disposition: A | Discharge status: Home / Self Care | Payer: Medicare Other | Source: Ambulatory Visit | Attending: Internal Medicine | Admitting: Internal Medicine

## 2013-06-08 ENCOUNTER — Other Ambulatory Visit: Payer: Self-pay | Admitting: Internal Medicine

## 2013-06-08 DIAGNOSIS — M549 Dorsalgia, unspecified: Secondary | ICD-10-CM

## 2013-06-08 DIAGNOSIS — IMO0002 Reserved for concepts with insufficient information to code with codable children: Secondary | ICD-10-CM

## 2013-06-08 DIAGNOSIS — S22060A Wedge compression fracture of T7-T8 vertebra, initial encounter for closed fracture: Secondary | ICD-10-CM

## 2013-06-08 DIAGNOSIS — M810 Age-related osteoporosis without current pathological fracture: Secondary | ICD-10-CM

## 2013-06-08 DIAGNOSIS — C3490 Malignant neoplasm of unspecified part of unspecified bronchus or lung: Secondary | ICD-10-CM

## 2013-06-08 NOTE — Telephone Encounter (Signed)
Faxed script back to [facility].../lmb 

## 2013-06-12 ENCOUNTER — Encounter (HOSPITAL_COMMUNITY): Payer: Self-pay

## 2013-06-12 ENCOUNTER — Other Ambulatory Visit: Payer: Self-pay | Admitting: Radiology

## 2013-06-13 ENCOUNTER — Ambulatory Visit (HOSPITAL_COMMUNITY)
Admission: RE | Admit: 2013-06-13 | Discharge: 2013-06-13 | Disposition: A | Discharge status: Home / Self Care | Payer: Medicare Other | Source: Ambulatory Visit | Attending: Radiology | Admitting: Radiology

## 2013-06-13 ENCOUNTER — Ambulatory Visit (HOSPITAL_COMMUNITY)
Admission: RE | Admit: 2013-06-13 | Discharge: 2013-06-13 | Disposition: A | Discharge status: Home / Self Care | Payer: Medicare Other | Source: Ambulatory Visit | Attending: Interventional Radiology | Admitting: Interventional Radiology

## 2013-06-13 ENCOUNTER — Encounter (HOSPITAL_COMMUNITY): Payer: Self-pay

## 2013-06-13 DIAGNOSIS — Z85118 Personal history of other malignant neoplasm of bronchus and lung: Secondary | ICD-10-CM | POA: Insufficient documentation

## 2013-06-13 DIAGNOSIS — M810 Age-related osteoporosis without current pathological fracture: Secondary | ICD-10-CM | POA: Insufficient documentation

## 2013-06-13 DIAGNOSIS — I10 Essential (primary) hypertension: Secondary | ICD-10-CM | POA: Insufficient documentation

## 2013-06-13 DIAGNOSIS — M549 Dorsalgia, unspecified: Secondary | ICD-10-CM

## 2013-06-13 DIAGNOSIS — M4850XA Collapsed vertebra, not elsewhere classified, site unspecified, initial encounter for fracture: Secondary | ICD-10-CM | POA: Insufficient documentation

## 2013-06-13 DIAGNOSIS — IMO0002 Reserved for concepts with insufficient information to code with codable children: Secondary | ICD-10-CM

## 2013-06-13 DIAGNOSIS — S22009A Unspecified fracture of unspecified thoracic vertebra, initial encounter for closed fracture: Secondary | ICD-10-CM | POA: Insufficient documentation

## 2013-06-13 DIAGNOSIS — Z794 Long term (current) use of insulin: Secondary | ICD-10-CM | POA: Insufficient documentation

## 2013-06-13 DIAGNOSIS — I509 Heart failure, unspecified: Secondary | ICD-10-CM | POA: Insufficient documentation

## 2013-06-13 DIAGNOSIS — E785 Hyperlipidemia, unspecified: Secondary | ICD-10-CM | POA: Insufficient documentation

## 2013-06-13 DIAGNOSIS — I4891 Unspecified atrial fibrillation: Secondary | ICD-10-CM | POA: Insufficient documentation

## 2013-06-13 DIAGNOSIS — E119 Type 2 diabetes mellitus without complications: Secondary | ICD-10-CM | POA: Insufficient documentation

## 2013-06-13 DIAGNOSIS — D509 Iron deficiency anemia, unspecified: Secondary | ICD-10-CM | POA: Insufficient documentation

## 2013-06-13 DIAGNOSIS — I5032 Chronic diastolic (congestive) heart failure: Secondary | ICD-10-CM | POA: Insufficient documentation

## 2013-06-13 DIAGNOSIS — X500XXA Overexertion from strenuous movement or load, initial encounter: Secondary | ICD-10-CM | POA: Insufficient documentation

## 2013-06-13 HISTORY — DX: Collapsed vertebra, not elsewhere classified, site unspecified, initial encounter for fracture: M48.50XA

## 2013-06-13 LAB — CBC
MCHC: 34.6 g/dL (ref 30.0–36.0)
MCV: 85.4 fL (ref 78.0–100.0)
Platelets: 273 10*3/uL (ref 150–400)
RBC: 4.46 MIL/uL (ref 3.87–5.11)
RDW: 15.4 % (ref 11.5–15.5)
WBC: 7.9 10*3/uL (ref 4.0–10.5)

## 2013-06-13 LAB — GLUCOSE, CAPILLARY
Glucose-Capillary: 215 mg/dL — ABNORMAL HIGH (ref 70–99)
Glucose-Capillary: 245 mg/dL — ABNORMAL HIGH (ref 70–99)

## 2013-06-13 LAB — BASIC METABOLIC PANEL
CO2: 29 mEq/L (ref 19–32)
Calcium: 9.2 mg/dL (ref 8.4–10.5)
Chloride: 91 mEq/L — ABNORMAL LOW (ref 96–112)
Creatinine, Ser: 0.94 mg/dL (ref 0.50–1.10)
GFR calc Af Amer: 63 mL/min — ABNORMAL LOW (ref >90)
Glucose, Bld: 219 mg/dL — ABNORMAL HIGH (ref 70–99)
Sodium: 127 mEq/L — ABNORMAL LOW (ref 135–145)

## 2013-06-13 LAB — PROTIME-INR: INR: 1.21 (ref 0.00–1.49)

## 2013-06-13 LAB — APTT: aPTT: 37 seconds (ref 24–37)

## 2013-06-13 MED ORDER — MIDAZOLAM HCL 2 MG/2ML IJ SOLN
INTRAMUSCULAR | Status: AC
  Filled 2013-06-13: qty 2

## 2013-06-13 MED ORDER — NALOXONE HCL 0.4 MG/ML IJ SOLN
INTRAMUSCULAR | Status: AC
  Filled 2013-06-13: qty 1

## 2013-06-13 MED ORDER — TOBRAMYCIN SULFATE 1.2 G IJ SOLR
INTRAMUSCULAR | Status: AC
  Filled 2013-06-13: qty 1.2

## 2013-06-13 MED ORDER — ACETAMINOPHEN 500 MG PO TABS
500.0000 mg | ORAL_TABLET | Freq: Once | ORAL | Status: AC
  Administered 2013-06-13: 500 mg via ORAL
  Filled 2013-06-13 (×2): qty 1

## 2013-06-13 MED ORDER — FENTANYL CITRATE 0.05 MG/ML IJ SOLN
INTRAMUSCULAR | Status: AC | PRN
  Administered 2013-06-13 (×2): 25 ug via INTRAVENOUS

## 2013-06-13 MED ORDER — SODIUM CHLORIDE 0.9 % IV SOLN: INTRAVENOUS | Status: DC

## 2013-06-13 MED ORDER — HYDRALAZINE HCL 20 MG/ML IJ SOLN
INTRAMUSCULAR | Status: AC
  Filled 2013-06-13: qty 1

## 2013-06-13 MED ORDER — TRAMADOL HCL 50 MG PO TABS
50.0000 mg | ORAL_TABLET | Freq: Once | ORAL | Status: AC
  Administered 2013-06-13: 50 mg via ORAL
  Filled 2013-06-13: qty 1

## 2013-06-13 MED ORDER — HYDROMORPHONE HCL PF 1 MG/ML IJ SOLN
INTRAMUSCULAR | Status: AC
  Filled 2013-06-13: qty 2

## 2013-06-13 MED ORDER — FENTANYL CITRATE 0.05 MG/ML IJ SOLN
INTRAMUSCULAR | Status: AC
  Filled 2013-06-13: qty 2

## 2013-06-13 MED ORDER — MIDAZOLAM HCL 2 MG/2ML IJ SOLN
INTRAMUSCULAR | Status: AC | PRN
  Administered 2013-06-13 (×2): 1 mg via INTRAVENOUS

## 2013-06-13 MED ORDER — INSULIN ASPART 100 UNIT/ML ~~LOC~~ SOLN
3.0000 [IU] | Freq: Once | SUBCUTANEOUS | Status: AC
  Administered 2013-06-13: 3 [IU] via SUBCUTANEOUS

## 2013-06-13 MED ORDER — SODIUM CHLORIDE 0.9 % IV SOLN
Freq: Once | INTRAVENOUS | Status: AC
  Administered 2013-06-13: 09:00:00 via INTRAVENOUS

## 2013-06-13 MED ORDER — CEFAZOLIN SODIUM-DEXTROSE 2-3 GM-% IV SOLR
2.0000 g | Freq: Once | INTRAVENOUS | Status: AC
  Administered 2013-06-13: 2 g via INTRAVENOUS
  Filled 2013-06-13: qty 50

## 2013-06-13 MED ORDER — INSULIN ASPART PROT & ASPART (70-30 MIX) 100 UNIT/ML ~~LOC~~ SUSP: 3.0000 [IU] | Freq: Once | SUBCUTANEOUS | Status: DC

## 2013-06-13 MED ORDER — FLUMAZENIL 0.5 MG/5ML IV SOLN
INTRAVENOUS | Status: AC
  Filled 2013-06-13: qty 5

## 2013-06-13 NOTE — Progress Notes (Signed)
Per Dr. Corliss Skains patient was given instructions to be discharged per routine after IR procedure today. Ribs xrays taken and formal report pending. Patient is instructed to take tylenol for pain, apply warm compresses to area affected and support area of pain if coughing. The patient will be contacted 06/14/13 to follow-up.   Pattricia Boss PA-C Interventional Radiology  06/13/13  4:31 PM

## 2013-06-13 NOTE — Progress Notes (Signed)
TAKEA,RN TO NOTIFY DR Corliss Skains OF C/O BACK PAIN 8/10 AND C/O LEFT SIDE PAIN 7/10 AND CLIENT STATES LEFT SIDE PAIN JUST STARTED WHEN MOVED FROM TABLE TO BED IN RADIOLOGY AND IV NOT WORKING: SHE WILL CALL ME BACK AFTER SPEAKS TO DR Corliss Skains

## 2013-06-13 NOTE — Progress Notes (Signed)
DR DEVESHWAR NOTIFIED OF [facility] AND PER DR DEVESHWAR OK TO D/C HOME WITHOUT CXR RESULTS AND NO NEW ORDERS; PER DR DEVESHWAR THEY WILL CALL CLIENT TOMORROW ABOUT CXR RESULTS

## 2013-06-13 NOTE — H&P (Signed)
Wendy Hopkins is an 79 y.o. female.   Chief Complaint: Pt was seen in consultation with Dr Corliss Skains few days ago Denies injury to back Turned her back and felt sudden pain 05/29/2013 MRI reveals Thoracic 7 acute fracture Pain is "9" on scale of 10 Using wheelchair for distances Scheduled now for T7 vertebroplasty vs kyphoplasty Pt with Afib- on coumadin; off x 5 days; using Lovenox- last dose yesterday am  Pt has had previous T12 VP 08/2012--good relief  HPI: HLD; IDA; TIA; CHF; HTN; afib- on coumadin- off x 5 days; Lung Ca; DM  Past Medical History  Diagnosis Date  . Arthritis   . Hyperlipidemia   . 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 Closed fracture of unspecified part of femur 2008  . TIA (transient ischemic attack)   . Edema   . Chronic diastolic heart failure   . Osteoporosis with fracture     compression fx lower T spine and t4  . Thyroid nodule     dominate R nodule, s/p aspiration>abn path>6 mo obs planned (gerkin 06/01/11)  . GERD (gastroesophageal reflux disease)   . Hypertension 2006  . Atrial fibrillation     chronic anticoag; and AFlutter  . Adenocarcinoma, lung dx 11/08/12    CT chest bx - XRT ongoing  . History of radiation therapy 11/30/2012-12/09/2012    50 gray to right chest  . Diabetes mellitus, type II, insulin dependent 08-15-12    over 40 years ago  . Vertebral compression fracture     Thoracic 12 and Thoracic 7    Past Surgical History  Procedure Laterality Date  . Right femur  06/25/07    Intramedullary open reduction internal fixation  . Vitrectomy  2007    right eye with cataract repair - dr. Gwendalyn Ege  . Tonsillectomy  1947    Family History  Problem Relation Age of Onset  . Hypertension Father     Parent not sure which 1  . Diabetes Brother   . Lung cancer Other   . Cancer Sister   . Parkinsonism Sister    Social History:  reports that she has never smoked. She has never used smokeless tobacco. She reports  that she does not drink alcohol or use illicit drugs.  Allergies:  Allergies  Allergen Reactions  . Codeine Nausea And Vomiting          (Not in a hospital admission)  No results found for this or any previous visit (from the past 48 hour(s)). No results found.  Review of Systems  Constitutional: Negative for fever and weight loss.  Respiratory: Negative for shortness of breath.   Cardiovascular: Negative for chest pain.  Gastrointestinal: Negative for nausea, vomiting and abdominal pain.  Musculoskeletal: Positive for back pain.  Neurological: Positive for weakness. Negative for dizziness.    Blood pressure 172/85, pulse 88, temperature 97 F (36.1 C), temperature source Oral, resp. rate 20, height 4\' 11"  (1.499 m), weight 122 lb (55.339 kg), SpO2 100.00%. Physical Exam  Constitutional: She is oriented to person, place, and time. She appears well-nourished.  Cardiovascular: Normal rate, regular rhythm and normal heart sounds.   No murmur heard. Respiratory: Effort normal and breath sounds normal. She has no wheezes.  GI: Soft. Bowel sounds are normal. There is no tenderness.  Musculoskeletal: Normal range of motion.  FROM; tender to palpate T7 area  Neurological: She is alert and oriented to person, place, and time. Coordination normal.  Skin: Skin is  warm and dry.  Psychiatric: She has a normal mood and affect. Her behavior is normal. Thought content normal.     Assessment/Plan Acute T7 fracture Back pain Scheduled now for T7 VP/KP Pt and family aware of procedure benefits and risks and agreeable to proceed Consent signed and in chart  Kasidee Voisin A 06/13/2013, 9:04 AM

## 2013-06-13 NOTE — ED Notes (Signed)
MD talking with family

## 2013-06-13 NOTE — ED Notes (Signed)
Pt c/o of L upper chest pain; MD at bedside evaluating pt and assessing her pain. MD explained to pt and daughter what could be source of pain. Gave instructions to pt about what to do to help alleviate pain and to let RN know if pain became worse and he would order chest xray if needed.  Pt unable to describe if pain is sharp or dull. Ice pack applied to area as MD advised. Daughter at bedside and doesn't have any extra questions for MD.

## 2013-06-13 NOTE — Procedures (Signed)
S/P T7 VP with biopsy 

## 2013-06-14 NOTE — Progress Notes (Signed)
Patient ID: Wendy Hopkins, female   DOB: Jun 24, 1926, 79 y.o.   MRN: [id number]   Thoracic 7 Vertebroplasty performed 11/4  Pt developed mid anterior rib pain post procedure Rib films were obtained and does show possible new fracture in L low rib region. Many old and healed left rib fractures  K Allred PAC was able to contact pt today. (he did call dtr first- N/A)  She states she does still have pain in mid back and pain radiating to left lateral chest  Using Tramadol and Tylenol Along with cool and heat compresses  Encouraged pt to continue this regimen Will see pt in 2 week follow up  Call sooner if needed.

## 2013-06-19 ENCOUNTER — Other Ambulatory Visit: Payer: Self-pay | Admitting: Internal Medicine

## 2013-06-20 ENCOUNTER — Other Ambulatory Visit: Payer: Self-pay | Admitting: Internal Medicine

## 2013-06-20 ENCOUNTER — Ambulatory Visit (INDEPENDENT_AMBULATORY_CARE_PROVIDER_SITE_OTHER): Payer: Medicare Other | Admitting: General Practice

## 2013-06-20 DIAGNOSIS — G459 Transient cerebral ischemic attack, unspecified: Secondary | ICD-10-CM

## 2013-06-20 DIAGNOSIS — I4891 Unspecified atrial fibrillation: Secondary | ICD-10-CM

## 2013-06-20 DIAGNOSIS — Z7901 Long term (current) use of anticoagulants: Secondary | ICD-10-CM

## 2013-06-20 LAB — POCT INR: INR: 6

## 2013-06-20 NOTE — Progress Notes (Signed)
Pre-visit discussion using our clinic review tool. No additional management support is needed unless otherwise documented below in the visit note.  

## 2013-06-20 NOTE — Telephone Encounter (Signed)
Faxed script back to [facility].../lmb 

## 2013-06-22 ENCOUNTER — Other Ambulatory Visit (INDEPENDENT_AMBULATORY_CARE_PROVIDER_SITE_OTHER): Payer: Medicare Other

## 2013-06-22 ENCOUNTER — Ambulatory Visit (INDEPENDENT_AMBULATORY_CARE_PROVIDER_SITE_OTHER): Payer: Medicare Other | Admitting: Internal Medicine

## 2013-06-22 ENCOUNTER — Encounter: Payer: Self-pay | Admitting: Internal Medicine

## 2013-06-22 VITALS — BP 140/72 | HR 70 | Temp 97.7°F | Wt 133.4 lb

## 2013-06-22 DIAGNOSIS — E785 Hyperlipidemia, unspecified: Secondary | ICD-10-CM

## 2013-06-22 DIAGNOSIS — I4891 Unspecified atrial fibrillation: Secondary | ICD-10-CM

## 2013-06-22 DIAGNOSIS — IMO0002 Reserved for concepts with insufficient information to code with codable children: Secondary | ICD-10-CM

## 2013-06-22 DIAGNOSIS — I5032 Chronic diastolic (congestive) heart failure: Secondary | ICD-10-CM

## 2013-06-22 DIAGNOSIS — M810 Age-related osteoporosis without current pathological fracture: Secondary | ICD-10-CM

## 2013-06-22 LAB — LIPID PANEL
Total CHOL/HDL Ratio: 2
Triglycerides: 63 mg/dL (ref 0.0–149.0)

## 2013-06-22 MED ORDER — LIDOCAINE 5 % EX PTCH: 1.0000 | MEDICATED_PATCH | CUTANEOUS | Status: DC

## 2013-06-22 NOTE — Assessment & Plan Note (Signed)
On crestor - last lipids reviewed The current medical regimen is effective;  continue present plan and medications. Check annually

## 2013-06-22 NOTE — Assessment & Plan Note (Signed)
Chronic issue: complicated by tachy-brady on beta-blocker  prev offered but declined PPM ongoing follow up with cards chronic anticoag at [facility]

## 2013-06-22 NOTE — Assessment & Plan Note (Signed)
Vertebral compression fx 07/2012 s/p KP Recurrent fx s/p KP 06/2013 for T7 Incidental L rib fx Prescribed Actonel early 2014, but none taken in past 3 months because no refills and cost prohibitive Remain off therapy at this time, but consider alternate therapy especially if recurrent compression fracture

## 2013-06-22 NOTE — Assessment & Plan Note (Signed)
Volume overload - ?precipitated by stressors of vert fx/med irregularity Continue current lasix dose Check 2d echo and follow up cards

## 2013-06-22 NOTE — Progress Notes (Signed)
  Subjective:    Patient ID: Wendy Hopkins, female    DOB: 11-21-1925, 79 y.o.   MRN: [id number]  HPI Here for follow up - reviewed chronic medical history and interval events   Past Medical History  Diagnosis Date  . Arthritis   . Hyperlipidemia   . 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 Closed fracture of unspecified part of femur 2008  . TIA (transient ischemic attack)   . Edema   . Chronic diastolic heart failure   . Osteoporosis with fracture     compression fx lower T spine and t4  . Thyroid nodule     dominate R nodule, s/p aspiration>abn path>6 mo obs planned (gerkin 06/01/11)  . GERD (gastroesophageal reflux disease)   . Hypertension 2006  . Atrial fibrillation     chronic anticoag; and AFlutter  . Adenocarcinoma, lung dx 11/08/12    CT chest bx - XRT ongoing  . History of radiation therapy 11/30/2012-12/09/2012    50 gray to right chest  . Diabetes mellitus, type II, insulin dependent 08-15-12    over 40 years ago  . Vertebral compression fracture     Thoracic 12 and Thoracic 7    Review of Systems  Respiratory: Negative for cough and shortness of breath.   Cardiovascular: Negative for chest pain and palpitations.  Gastrointestinal: Positive for nausea. Negative for vomiting, diarrhea, constipation, blood in stool, abdominal distention and anal bleeding.       Objective:   Physical Exam BP 140/72  Pulse 70  Temp(Src) 97.7 F (36.5 C) (Oral)  Wt 133 lb 6.4 oz (60.51 kg)  SpO2 97% Wt Readings from Last 3 Encounters:  06/22/13 133 lb 6.4 oz (60.51 kg)  06/13/13 122 lb (55.339 kg)  05/31/13 122 lb 6.4 oz (55.52 kg)   Constitutional: She appears very uncomfortable sitting in WC but well-developed and well-nourished. Dtr and spouse at side Neck: Normal range of motion. Neck supple. No JVD present. No thyromegaly present.  Cardiovascular: Normal rate, regular rhythm and normal heart sounds.  No murmur heard. 2+ edema  BLE. Pulmonary/Chest: Effort normal and breath sounds normal. No respiratory distress. She has no wheezes.  Psychiatric: She has a normal mood and affect. Her behavior is normal. Judgment and thought content normal.   Lab Results  Component Value Date   WBC 7.9 06/13/2013   HGB 13.2 06/13/2013   HCT 38.1 06/13/2013   PLT 273 06/13/2013   GLUCOSE 219* 06/13/2013   CHOL 135 06/20/2012   TRIG 86.0 06/20/2012   HDL 59.90 06/20/2012   LDLCALC 58 06/20/2012   ALT 16 07/30/2012   AST 24 07/30/2012   NA 127* 06/13/2013   K 5.4* 06/13/2013   CL 91* 06/13/2013   CREATININE 0.94 06/13/2013   BUN 16 06/13/2013   CO2 29 06/13/2013   TSH 1.98 08/24/2011   INR 6.0 06/20/2013   HGBA1C 9.0* 03/04/2012       Assessment & Plan:   See problem list. Medications and labs reviewed today.  Right rib fracture - lidocaine patch for analgesia - rx done

## 2013-06-22 NOTE — Patient Instructions (Signed)
It was good to see you today.  We have reviewed your prior records including labs and tests today  Medications reviewed and updated, use lidocaine patch for rib pain -no other changes recommended at this time.  Test(s) ordered today. Your results will be released to MyChart (or called to you) after review, usually within 72hours after test completion. If any changes need to be made, you will be notified at that same time.  we'll make referral to ultrasound of heart because of leg swelling. Our office will contact you regarding appointment(s) once made.  Please schedule followup in 3-4 months, call sooner if problems.

## 2013-06-23 ENCOUNTER — Ambulatory Visit (INDEPENDENT_AMBULATORY_CARE_PROVIDER_SITE_OTHER): Payer: Medicare Other | Admitting: General Practice

## 2013-06-23 ENCOUNTER — Telehealth (HOSPITAL_COMMUNITY): Payer: Self-pay | Admitting: Interventional Radiology

## 2013-06-23 ENCOUNTER — Other Ambulatory Visit (HOSPITAL_COMMUNITY): Payer: Self-pay | Admitting: Interventional Radiology

## 2013-06-23 DIAGNOSIS — G459 Transient cerebral ischemic attack, unspecified: Secondary | ICD-10-CM

## 2013-06-23 DIAGNOSIS — I4891 Unspecified atrial fibrillation: Secondary | ICD-10-CM

## 2013-06-23 DIAGNOSIS — M549 Dorsalgia, unspecified: Secondary | ICD-10-CM

## 2013-06-23 DIAGNOSIS — IMO0002 Reserved for concepts with insufficient information to code with codable children: Secondary | ICD-10-CM

## 2013-06-23 DIAGNOSIS — Z7901 Long term (current) use of anticoagulants: Secondary | ICD-10-CM

## 2013-06-23 LAB — POCT INR: INR: 1.3

## 2013-06-23 NOTE — Progress Notes (Signed)
Pre-visit discussion using our clinic review tool. No additional management support is needed unless otherwise documented below in the visit note.  

## 2013-06-23 NOTE — Telephone Encounter (Signed)
Left message on pt's home phone, but did speak to pt's daughter and scheduled appt w/ her. Pt's daughter states that our staff "broke her mother's rib while rolling her over onto our table for the procedure." JM

## 2013-06-26 ENCOUNTER — Other Ambulatory Visit: Payer: Self-pay | Admitting: Internal Medicine

## 2013-06-26 ENCOUNTER — Other Ambulatory Visit: Payer: Self-pay | Admitting: General Practice

## 2013-06-26 ENCOUNTER — Ambulatory Visit (HOSPITAL_COMMUNITY)
Admission: RE | Admit: 2013-06-26 | Discharge: 2013-06-26 | Disposition: A | Discharge status: Home / Self Care | Payer: Medicare Other | Source: Ambulatory Visit | Attending: Interventional Radiology | Admitting: Interventional Radiology

## 2013-06-26 DIAGNOSIS — M549 Dorsalgia, unspecified: Secondary | ICD-10-CM

## 2013-06-26 DIAGNOSIS — IMO0002 Reserved for concepts with insufficient information to code with codable children: Secondary | ICD-10-CM

## 2013-06-26 MED ORDER — WARFARIN SODIUM 5 MG PO TABS: ORAL_TABLET | ORAL | Status: DC

## 2013-06-28 ENCOUNTER — Other Ambulatory Visit: Payer: Self-pay | Admitting: Internal Medicine

## 2013-07-05 ENCOUNTER — Other Ambulatory Visit (HOSPITAL_COMMUNITY): Payer: Self-pay | Admitting: Internal Medicine

## 2013-07-05 ENCOUNTER — Ambulatory Visit (HOSPITAL_COMMUNITY): Payer: Medicare Other | Attending: Cardiology | Admitting: Radiology

## 2013-07-05 ENCOUNTER — Encounter: Payer: Self-pay | Admitting: Cardiology

## 2013-07-05 DIAGNOSIS — Z923 Personal history of irradiation: Secondary | ICD-10-CM | POA: Insufficient documentation

## 2013-07-05 DIAGNOSIS — C3490 Malignant neoplasm of unspecified part of unspecified bronchus or lung: Secondary | ICD-10-CM | POA: Insufficient documentation

## 2013-07-05 DIAGNOSIS — I4891 Unspecified atrial fibrillation: Secondary | ICD-10-CM | POA: Insufficient documentation

## 2013-07-05 DIAGNOSIS — I509 Heart failure, unspecified: Secondary | ICD-10-CM | POA: Insufficient documentation

## 2013-07-05 DIAGNOSIS — I379 Nonrheumatic pulmonary valve disorder, unspecified: Secondary | ICD-10-CM | POA: Insufficient documentation

## 2013-07-05 DIAGNOSIS — R609 Edema, unspecified: Secondary | ICD-10-CM

## 2013-07-05 DIAGNOSIS — E785 Hyperlipidemia, unspecified: Secondary | ICD-10-CM | POA: Insufficient documentation

## 2013-07-05 DIAGNOSIS — I079 Rheumatic tricuspid valve disease, unspecified: Secondary | ICD-10-CM | POA: Insufficient documentation

## 2013-07-05 DIAGNOSIS — I5032 Chronic diastolic (congestive) heart failure: Secondary | ICD-10-CM

## 2013-07-05 DIAGNOSIS — E119 Type 2 diabetes mellitus without complications: Secondary | ICD-10-CM | POA: Insufficient documentation

## 2013-07-05 DIAGNOSIS — I10 Essential (primary) hypertension: Secondary | ICD-10-CM | POA: Insufficient documentation

## 2013-07-05 NOTE — Progress Notes (Signed)
Echocardiogram performed.  

## 2013-07-17 LAB — HEPATIC FUNCTION PANEL
ALT: 17 U/L (ref 7–35)
AST: 18 U/L (ref 13–35)
Alkaline Phosphatase: 153 U/L — AB (ref 25–125)
Bilirubin, Total: 0.7 mg/dL

## 2013-07-17 LAB — HEMOGLOBIN A1C: HEMOGLOBIN A1C: 8.7 % — AB (ref 4.0–6.0)

## 2013-07-17 LAB — TSH: TSH: 2.14 u[IU]/mL (ref 0.41–5.90)

## 2013-07-17 LAB — BASIC METABOLIC PANEL
BUN: 15 mg/dL (ref 4–21)
Creatinine: 0.8 mg/dL (ref 0.5–1.1)
Glucose: 297 mg/dL
POTASSIUM: 4.7 mmol/L (ref 3.4–5.3)
Sodium: 130 mmol/L — AB (ref 137–147)

## 2013-07-19 ENCOUNTER — Ambulatory Visit (INDEPENDENT_AMBULATORY_CARE_PROVIDER_SITE_OTHER): Payer: Medicare Other | Admitting: General Practice

## 2013-07-19 DIAGNOSIS — I4891 Unspecified atrial fibrillation: Secondary | ICD-10-CM

## 2013-07-19 DIAGNOSIS — Z7901 Long term (current) use of anticoagulants: Secondary | ICD-10-CM

## 2013-07-19 DIAGNOSIS — G459 Transient cerebral ischemic attack, unspecified: Secondary | ICD-10-CM

## 2013-07-19 LAB — POCT INR: INR: 3.9

## 2013-07-19 NOTE — Progress Notes (Signed)
Pre-visit discussion using our clinic review tool. No additional management support is needed unless otherwise documented below in the visit note.  

## 2013-07-22 ENCOUNTER — Other Ambulatory Visit: Payer: Self-pay | Admitting: Internal Medicine

## 2013-07-27 ENCOUNTER — Other Ambulatory Visit: Payer: Self-pay | Admitting: Internal Medicine

## 2013-07-27 ENCOUNTER — Other Ambulatory Visit: Payer: Self-pay | Admitting: *Deleted

## 2013-07-27 MED ORDER — FUROSEMIDE 40 MG PO TABS: 60.0000 mg | ORAL_TABLET | Freq: Every day | ORAL | Status: DC

## 2013-08-08 ENCOUNTER — Encounter: Payer: Self-pay | Admitting: Cardiology

## 2013-08-08 ENCOUNTER — Ambulatory Visit (INDEPENDENT_AMBULATORY_CARE_PROVIDER_SITE_OTHER): Payer: Medicare Other | Admitting: Cardiology

## 2013-08-08 VITALS — BP 148/78 | HR 69 | Ht 59.0 in | Wt 113.0 lb

## 2013-08-08 DIAGNOSIS — I5032 Chronic diastolic (congestive) heart failure: Secondary | ICD-10-CM

## 2013-08-08 DIAGNOSIS — Z87898 Personal history of other specified conditions: Secondary | ICD-10-CM

## 2013-08-08 DIAGNOSIS — I4891 Unspecified atrial fibrillation: Secondary | ICD-10-CM

## 2013-08-08 DIAGNOSIS — I498 Other specified cardiac arrhythmias: Secondary | ICD-10-CM

## 2013-08-08 MED ORDER — FUROSEMIDE 40 MG PO TABS: 60.0000 mg | ORAL_TABLET | Freq: Every day | ORAL | Status: DC

## 2013-08-08 NOTE — Progress Notes (Signed)
Patient ID: Wendy Hopkins MRN: [id number], DOB/AGE: Sep 02, 1925   Date of Visit: 08/08/2013  Primary Physician: Rene Paci, MD Primary Cardiologist: Graciela Husbands, MD Reason for Visit: Overdue follow-up for atrial fibrillation  History of Present Illness  Wendy Hopkins is a 79 y.o. 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 She was last seen by Dr. Graciela Husbands July 2013. She is accompanied by her daughter.   Since last being seen in our clinic, she reports recurrent compression fractures in setting of osteoporosis, managed by PCP. She has had kyphoplasty. She was also diagnosed with rib fractures. She has chronic back pain. She reports she is doing well from a cardiac standpoint and has no cardiac complaints. She denies chest pain or shortness of breath. She denies palpitations, dizziness, near syncope or syncope. She has intermittent LE swelling but denies worsening. She takes Lasix daily. She denies orthopnea or PND. She is compliant.  Past Medical History Past Medical History  Diagnosis Date  . Arthritis   . Hyperlipidemia   . 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 Closed fracture of unspecified part of femur 2008  . TIA (transient ischemic attack)   . Edema   . Chronic diastolic heart failure   . Osteoporosis with fracture     compression fx lower T spine and t4, L rib  . Thyroid nodule     dominate R nodule, s/p aspiration>abn path>6 mo obs planned (gerkin 06/01/11)  . GERD (gastroesophageal reflux disease)   . Hypertension 2006  . Atrial fibrillation     chronic anticoag; and AFlutter  . Adenocarcinoma, lung dx 11/08/12    CT chest bx - XRT ongoing  . History of radiation therapy 11/30/2012-12/09/2012    50 gray to right chest  . Diabetes mellitus, type II, insulin dependent 08-15-12    over 40 years ago  . Vertebral compression fracture     Thoracic 12 and Thoracic 7    Past Surgical History Past Surgical History  Procedure Laterality Date  . Right femur  06/25/07    Intramedullary open reduction internal fixation  . Vitrectomy  2007    right eye with cataract repair - dr. Gwendalyn Ege  . Tonsillectomy  1947    Allergies/Intolerances Allergies  Allergen Reactions  . Codeine Nausea And Vomiting         Current Home Medications Current Outpatient Prescriptions  Medication Sig Dispense Refill  . ACCU-CHEK AVIVA test strip 1 each 3 (three) times daily. Check twice daily      . acetaminophen (TYLENOL) 500 MG tablet Take 1 tablet (500 mg total) by mouth every 6 (six) hours as needed for pain or fever.  30 tablet  0  . atenolol (TENORMIN) 25 MG tablet Take 12.5 mg by mouth daily.      Marland Kitchen atorvastatin (LIPITOR) 10 MG tablet Take 10 mg by mouth daily.      . Cholecalciferol (VITAMIN D) 2000 UNITS CAPS Take 2,000 Units by mouth daily.        . furosemide (LASIX) 40 MG tablet Take 1.5 tablets (60 mg total) by mouth daily.  45 tablet  0  . insulin glargine (LANTUS) 100 UNIT/ML injection Inject 8 Units into the skin daily.       . insulin lispro (HUMALOG) 100 UNIT/ML injection Inject 3 Units into the skin 3 (three) times daily before meals.      . Insulin Syringe-Needle  U-100 (INSULIN SYRINGE 1CC/30GX5/16") 30G X 5/16" 1 ML MISC Use four times a day      . Lancets (FREESTYLE) lancets Use three times a day to check blood sugar      . lidocaine (LIDODERM) 5 % Place 1 patch onto the skin as needed. Remove & Discard patch within 12 hours or as directed by MD      . warfarin (COUMADIN) 5 MG tablet Take as directed by anticoagulation clinic  30 tablet  2   No current facility-administered medications for this visit.    Social History History   Social History  . Marital Status: Married    Spouse Name: N/A    Number of Children: 4  . Years of Education: N/A   Occupational History  . retired     Geographical information systems officer   Social History Main Topics  . Smoking status: Never  Smoker   . Smokeless tobacco: Never Used  . Alcohol Use: No  . Drug Use: No  . Sexual Activity: Not on file   Other Topics Concern  . Not on file   Social History Narrative  . No narrative on file     Review of Systems General: No chills, fever, night sweats or weight changes Cardiovascular: No chest pain, dyspnea on exertion, edema, orthopnea, palpitations, paroxysmal nocturnal dyspnea Dermatological: No rash, lesions or masses Respiratory: No cough, dyspnea Urologic: No hematuria, dysuria Abdominal: No nausea, vomiting, diarrhea, bright red blood per rectum, melena, or hematemesis Neurologic: No visual changes, weakness, changes in mental status All other systems reviewed and are otherwise negative except as noted above.  Physical Exam Vitals: Blood pressure 148/78, pulse 69, height 4\' 11"  (1.499 m), weight 113 lb (51.256 kg).  General: Well developed, well appearing, elderly 79 y.o. female in no acute distress. HEENT: Normocephalic, atraumatic. EOMs intact. Sclera nonicteric. Oropharynx clear.  Neck: Supple. No JVD. Lungs: Respirations regular and unlabored, CTA bilaterally. No wheezes, rales or rhonchi. Heart: Irregular. S1, S2 present. No murmurs, rub, S3 or S4. Abdomen: Soft, non-distended.  Extremities: No clubbing, cyanosis or edema.  Psych: Normal affect. Neuro: Alert and oriented X 3. Moves all extremities spontaneously. Musculoskeletal: Significant kyphosis noted.    Diagnostics Echocardiogram November 2014 Study Conclusions - Left ventricle: The cavity size was normal. Wall thickness was increased in a pattern of mild LVH. Systolic function was normal. The estimated ejection fraction was in the range of 55% to 60%. - Aortic valve: Trivial regurgitation. - Left atrium: The atrium was moderately dilated. - Right atrium: The atrium was moderately dilated. - Atrial septum: No defect or patent foramen ovale was identified. - Tricuspid valve: Moderate  regurgitation. - Pulmonary arteries: PA peak pressure: 47mm Hg (S). 12-lead ECG today - atrial fibrillation at 71 bpm; QRS 78, QT/QTc 392/425  Assessment and Plan 1. Atrial fibrillation - stable; permanent; asymptomatic and rate controlled - continue BB for rate control - continue warfarin for stroke prevention 2. Chronic diastolic HF - stable - check BMET - continue furosemide 60 mg once daily as previously directed 3. History of bradycardia 4. History of AVNRT s/p EPS +RF ablation  Signed, Rick Duff, PA-C 08/08/2013, 3:19 PM

## 2013-08-08 NOTE — Patient Instructions (Signed)
Your physician wants you to follow-up in: 1 YEAR WITH DR. Logan Bores will receive a reminder letter in the mail two months in advance. If you don't receive a letter, please call our office to schedule the follow-up appointment.  Your physician recommends that you return for lab work in: TODAY Designer, [employer]ellery)  Your physician recommends that you continue on your current medications as directed. Please refer to the Current Medication list given to you today.  WE REFILLED YOUR LASIX TODAY

## 2013-08-09 LAB — BASIC METABOLIC PANEL
BUN: 17 mg/dL (ref 6–23)
Calcium: 9.4 mg/dL (ref 8.4–10.5)
Creatinine, Ser: 1 mg/dL (ref 0.4–1.2)
GFR: 55.99 mL/min — ABNORMAL LOW (ref >60.00)
Potassium: 4.4 mEq/L (ref 3.5–5.1)

## 2013-08-14 ENCOUNTER — Other Ambulatory Visit: Payer: Self-pay | Admitting: General Practice

## 2013-08-14 ENCOUNTER — Other Ambulatory Visit: Payer: Self-pay | Admitting: Internal Medicine

## 2013-08-14 MED ORDER — WARFARIN SODIUM 5 MG PO TABS: ORAL_TABLET | ORAL | Status: DC

## 2013-08-16 ENCOUNTER — Ambulatory Visit (INDEPENDENT_AMBULATORY_CARE_PROVIDER_SITE_OTHER): Payer: Medicare Other | Admitting: General Practice

## 2013-08-16 DIAGNOSIS — I4891 Unspecified atrial fibrillation: Secondary | ICD-10-CM

## 2013-08-16 DIAGNOSIS — Z7901 Long term (current) use of anticoagulants: Secondary | ICD-10-CM

## 2013-08-16 LAB — POCT INR: INR: 4.6

## 2013-08-16 NOTE — Progress Notes (Signed)
Pre-visit discussion using our clinic review tool. No additional management support is needed unless otherwise documented below in the visit note.  

## 2013-08-23 ENCOUNTER — Other Ambulatory Visit: Payer: Self-pay | Admitting: Internal Medicine

## 2013-08-26 ENCOUNTER — Other Ambulatory Visit: Payer: Self-pay | Admitting: Internal Medicine

## 2013-08-30 ENCOUNTER — Ambulatory Visit (INDEPENDENT_AMBULATORY_CARE_PROVIDER_SITE_OTHER): Payer: Medicare Other | Admitting: General Practice

## 2013-08-30 DIAGNOSIS — Z7901 Long term (current) use of anticoagulants: Secondary | ICD-10-CM

## 2013-08-30 DIAGNOSIS — I4891 Unspecified atrial fibrillation: Secondary | ICD-10-CM

## 2013-08-30 LAB — POCT INR: INR: 2.6

## 2013-08-30 NOTE — Progress Notes (Signed)
Pre-visit discussion using our clinic review tool. No additional management support is needed unless otherwise documented below in the visit note.  

## 2013-09-07 ENCOUNTER — Ambulatory Visit (INDEPENDENT_AMBULATORY_CARE_PROVIDER_SITE_OTHER)
Admission: RE | Admit: 2013-09-07 | Discharge: 2013-09-07 | Disposition: A | Discharge status: Home / Self Care | Payer: Medicare Other | Source: Ambulatory Visit | Attending: Internal Medicine | Admitting: Internal Medicine

## 2013-09-07 ENCOUNTER — Ambulatory Visit (INDEPENDENT_AMBULATORY_CARE_PROVIDER_SITE_OTHER): Payer: Medicare Other | Admitting: Internal Medicine

## 2013-09-07 ENCOUNTER — Encounter: Payer: Self-pay | Admitting: Internal Medicine

## 2013-09-07 VITALS — BP 110/70 | HR 63 | Temp 97.0°F | Wt 115.0 lb

## 2013-09-07 DIAGNOSIS — R079 Chest pain, unspecified: Secondary | ICD-10-CM

## 2013-09-07 DIAGNOSIS — C3430 Malignant neoplasm of lower lobe, unspecified bronchus or lung: Secondary | ICD-10-CM

## 2013-09-07 DIAGNOSIS — M810 Age-related osteoporosis without current pathological fracture: Secondary | ICD-10-CM

## 2013-09-07 DIAGNOSIS — R0781 Pleurodynia: Secondary | ICD-10-CM

## 2013-09-07 MED ORDER — ACETAMINOPHEN 500 MG PO TABS: 1000.0000 mg | ORAL_TABLET | Freq: Three times a day (TID) | ORAL | Status: DC | PRN

## 2013-09-07 MED ORDER — TIZANIDINE HCL 2 MG PO TABS: 2.0000 mg | ORAL_TABLET | Freq: Every day | ORAL | Status: DC

## 2013-09-07 NOTE — Assessment & Plan Note (Signed)
Vertebral compression fx 07/2012 s/p KP Recurrent fx s/p KP 06/2013 for T7 Incidental L rib fx 06/2013 Given recurrent pain in last 3 days on posterior left side, check DG rib and spine Prescribed Actonel early 2014, but stopped late summer 2014 due to cost prohibitive Remains off therapy at this time

## 2013-09-07 NOTE — Progress Notes (Signed)
Pre-visit discussion using our clinic review tool. No additional management support is needed unless otherwise documented below in the visit note.  

## 2013-09-07 NOTE — Assessment & Plan Note (Signed)
Dx clarified 11/08/12 with CT chest bx Not surgical candidate - s/p XRT completed 12/2012 - following with rad onc and pulm q 61mo Interval hx reviewed - CT chest and PET 04/2013 reviewed - For 74mo CT follow up 10/2013, consider sooner if abnormal DG today

## 2013-09-07 NOTE — Patient Instructions (Signed)
It was good to see you today.  We have reviewed your prior records including labs and tests today  xray(s) ordered today. Your results will be released to Alicia (or called to you) after review, usually within 72hours after test completion. If any changes need to be made, you will be notified at that same time.  Medications reviewed and updated Take 2 Tylenol 3 times a day every day and use muscle relaxer at bedtime for one week, then as needed for spasm - Your prescription(s) have been submitted to your pharmacy. Please take as directed and contact our office if you believe you are having problem(s) with the medication(s).  Also okay to use lidocaine numbing patch to tender area on left back as needed -call if refills are necessary  Please schedule followup in 3-4 months, call sooner if problems.

## 2013-09-07 NOTE — Progress Notes (Signed)
   Subjective:    Patient ID: Wendy Hopkins, female    DOB: [birth year], 80 y.o.   MRN: [id number]  Back Pain Pertinent negatives include no chest pain or fever.    Complains of left-sided back pain Onset 72 hours ago Symptoms worse while lying supine at night Denies trauma such as fall, injury or hard cough Review history of severe osteoporosis, prior kyphoplasty and history of lung cancer status post radiation treatment last year  Past Medical History  Diagnosis Date  . Arthritis   . Hyperlipidemia   . 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 Closed fracture of unspecified part of femur 2008  . TIA (transient ischemic attack)   . Edema   . Chronic diastolic heart failure   . Osteoporosis with fracture     compression fx lower T spine and t4, L rib  . Thyroid nodule     dominate R nodule, s/p aspiration>abn path>6 mo obs planned ([facility] 06/01/11)  . GERD (gastroesophageal reflux disease)   . Hypertension 2006  . Atrial fibrillation     chronic anticoag; and AFlutter  . Adenocarcinoma, lung dx 11/08/12    CT chest bx - XRT ongoing  . History of radiation therapy 11/30/2012-12/09/2012    50 gray to right chest  . Diabetes mellitus, type II, insulin dependent 08-15-12    over 40 years ago  . Vertebral compression fracture     Thoracic 12 and Thoracic 7    Review of Systems  Constitutional: Positive for fatigue. Negative for fever and unexpected weight change.  Respiratory: Negative for cough and shortness of breath.   Cardiovascular: Negative for chest pain and leg swelling.  Musculoskeletal: Positive for back pain. Negative for gait problem and joint swelling.       Objective:   Physical Exam  BP 110/70  Pulse 63  Temp(Src) 97 F (36.1 C) (Oral)  Wt 115 lb (52.164 kg)  SpO2 97% Wt Readings from Last 3 Encounters:  09/07/13 115 lb (52.164 kg)  08/08/13 113 lb (51.256 kg)  06/22/13 133 lb 6.4 oz (60.51 kg)   Gen: NAD -daughter Lenna Sciara at  side MSkel: Marked thoracic osteoporotic curvature. Nontender palpation of her vertebrae but tender to palpation of left posterior ribs approximately T10 Lungs: clear to auscultation, no wheeze or crackle. No increased work of breathing Cardiovascular: Regular rate and rhythm, no edema  Lab Results  Component Value Date   WBC 7.9 06/13/2013   HGB 13.2 06/13/2013   HCT 38.1 06/13/2013   PLT 273 06/13/2013   GLUCOSE 131* 08/08/2013   CHOL 105 06/22/2013   TRIG 63.0 06/22/2013   HDL 51.40 06/22/2013   LDLCALC 41 06/22/2013   ALT 16 07/30/2012   AST 24 07/30/2012   NA 137 08/08/2013   K 4.4 08/08/2013   CL 98 08/08/2013   CREATININE 1.0 08/08/2013   BUN 17 08/08/2013   CO2 33* 08/08/2013   TSH 1.98 08/24/2011   INR 2.6 08/30/2013   HGBA1C 9.0* 03/04/2012   CT chest and PET scan September 2014 reviewed -no evidence for musculoskeletal metastatic disease    Assessment & Plan:   See problem list. Medications and labs reviewed today.

## 2013-09-27 ENCOUNTER — Ambulatory Visit (INDEPENDENT_AMBULATORY_CARE_PROVIDER_SITE_OTHER): Payer: Medicare Other | Admitting: General Practice

## 2013-09-27 DIAGNOSIS — G459 Transient cerebral ischemic attack, unspecified: Secondary | ICD-10-CM

## 2013-09-27 DIAGNOSIS — Z5181 Encounter for therapeutic drug level monitoring: Secondary | ICD-10-CM | POA: Insufficient documentation

## 2013-09-27 DIAGNOSIS — Z7901 Long term (current) use of anticoagulants: Secondary | ICD-10-CM

## 2013-09-27 DIAGNOSIS — I4891 Unspecified atrial fibrillation: Secondary | ICD-10-CM

## 2013-09-27 LAB — POCT INR: INR: 5.2

## 2013-09-27 NOTE — Progress Notes (Signed)
Pre-visit discussion using our clinic review tool. No additional management support is needed unless otherwise documented below in the visit note.  

## 2013-10-04 ENCOUNTER — Ambulatory Visit (INDEPENDENT_AMBULATORY_CARE_PROVIDER_SITE_OTHER): Payer: Medicare Other | Admitting: General Practice

## 2013-10-04 DIAGNOSIS — I4891 Unspecified atrial fibrillation: Secondary | ICD-10-CM

## 2013-10-04 DIAGNOSIS — Z7901 Long term (current) use of anticoagulants: Secondary | ICD-10-CM

## 2013-10-04 LAB — POCT INR: INR: 2

## 2013-10-04 NOTE — Progress Notes (Signed)
Pre visit review using our clinic review tool, if applicable. No additional management support is needed unless otherwise documented below in the visit note. 

## 2013-10-12 ENCOUNTER — Telehealth: Payer: Self-pay | Admitting: Internal Medicine

## 2013-10-12 NOTE — Telephone Encounter (Signed)
Pt scheduled to see Truitt Merle, NP tomorrow. I also explained to dtr that pt is overdue to see Dr. Caryl Comes and will need to be seen. (I will wait to see what Cecille Rubin recommends before scheduling pt)

## 2013-10-12 NOTE — Telephone Encounter (Addendum)
Spoke with pt's dtr who tells me mom has been SOB since end of January after broken rib and compressed back fx. Although this might be related to healing process, pt hx of A Fib.  She also tells me that mom has not been active for many months now, secondary to recent rib fx and another one that occured in October. Daughter questioning if this might be panic related SOB. Discussed with being seen and dtr tells me Monday would be good. I will check into scheduling her and call her back (she had to go b/c they were in dr office).

## 2013-10-12 NOTE — Telephone Encounter (Signed)
New Message  Pt daughter called.. States that pt is having SOB with exertion// When she sits down she is ok. Daughter is requesting a call back for same day appt

## 2013-10-13 ENCOUNTER — Ambulatory Visit
Admission: RE | Admit: 2013-10-13 | Discharge: 2013-10-13 | Disposition: A | Discharge status: Home / Self Care | Payer: Medicare Other | Source: Ambulatory Visit | Attending: Nurse Practitioner | Admitting: Nurse Practitioner

## 2013-10-13 ENCOUNTER — Ambulatory Visit (INDEPENDENT_AMBULATORY_CARE_PROVIDER_SITE_OTHER): Payer: Medicare Other | Admitting: Nurse Practitioner

## 2013-10-13 ENCOUNTER — Encounter (INDEPENDENT_AMBULATORY_CARE_PROVIDER_SITE_OTHER): Payer: Self-pay

## 2013-10-13 ENCOUNTER — Telehealth: Payer: Self-pay | Admitting: Nurse Practitioner

## 2013-10-13 ENCOUNTER — Encounter: Payer: Self-pay | Admitting: Nurse Practitioner

## 2013-10-13 VITALS — BP 154/80 | HR 67 | Ht 59.0 in | Wt 110.0 lb

## 2013-10-13 DIAGNOSIS — R0989 Other specified symptoms and signs involving the circulatory and respiratory systems: Secondary | ICD-10-CM

## 2013-10-13 DIAGNOSIS — R0609 Other forms of dyspnea: Secondary | ICD-10-CM

## 2013-10-13 DIAGNOSIS — R0600 Dyspnea, unspecified: Secondary | ICD-10-CM

## 2013-10-13 LAB — CBC
HCT: 40.7 % (ref 36.0–46.0)
Hemoglobin: 13.3 g/dL (ref 12.0–15.0)
MCHC: 32.8 g/dL (ref 30.0–36.0)
MCV: 90.7 fl (ref 78.0–100.0)
Platelets: 264 10*3/uL (ref 150.0–400.0)
RBC: 4.48 Mil/uL (ref 3.87–5.11)
RDW: 17.6 % — ABNORMAL HIGH (ref 11.5–14.6)
WBC: 5.8 10*3/uL (ref 4.5–10.5)

## 2013-10-13 LAB — BASIC METABOLIC PANEL
BUN: 16 mg/dL (ref 6–23)
CO2: 30 mEq/L (ref 19–32)
Calcium: 9.5 mg/dL (ref 8.4–10.5)
Chloride: 94 mEq/L — ABNORMAL LOW (ref 96–112)
Creatinine, Ser: 1.1 mg/dL (ref 0.4–1.2)
GFR: 49.62 mL/min — ABNORMAL LOW (ref >60.00)
Glucose, Bld: 187 mg/dL — ABNORMAL HIGH (ref 70–99)
Potassium: 5.1 mEq/L (ref 3.5–5.1)
Sodium: 132 mEq/L — ABNORMAL LOW (ref 135–145)

## 2013-10-13 LAB — BRAIN NATRIURETIC PEPTIDE: Pro B Natriuretic peptide (BNP): 282 pg/mL — ABNORMAL HIGH (ref 0.0–100.0)

## 2013-10-13 NOTE — Progress Notes (Addendum)
Wendy Hopkins Date of Birth: [birth year] Medical Record #[id number]  History of Present Illness: Wendy Hopkins is seen back today for a work in visit. Seen for Wendy Hopkins. She is an 80 year old female with OA, HLD, iron deficiency anemia, TIA, chronic diastolic dysfunction, permanent AF, GERD, HTN, past lung cancer - treated with XRT, IDDM and compression fractures. She has had past AVNRT s/p EPS + RF ablation. She is on chronic coumadin therapy.   Last seen here back in December. Seemed to be doing ok but more limited by her compression fractures.  Called yesterday - "Spoke with pt's dtr who tells me mom has been SOB since end of January after broken rib and compressed back fx. Although this might be related to healing process, pt hx of A Fib. She also tells me that mom has not been active for many months now, secondary to recent rib fx and another one that occured in October. Daughter questioning if this might be panic related SOB.Discussed with being seen and dtr tells me Monday would be good. I will check into scheduling her and call her back (she had to go b/c they were in dr office)."  Hopkins in today to see me. Here with her daughter. Continues to have lots of back pain. Now with more shortness of breath - says that is worse than her pain. No cough. No fever. Some swelling but she has had her feet down more. Says she is losing weight. No appetite. For CT chest later this month.   Current Outpatient Prescriptions  Medication Sig Dispense Refill  . ACCU-CHEK AVIVA test strip 1 each 3 (three) times daily. Check twice daily      . acetaminophen (TYLENOL) 500 MG tablet Take 2 tablets (1,000 mg total) by mouth every 8 (eight) hours as needed for moderate pain.  30 tablet  0  . alendronate (FOSAMAX) 70 MG tablet Take 70 mg by mouth once a week. Take with a full glass of water on an empty stomach.      Marland Kitchen atenolol (TENORMIN) 25 MG tablet TAKE 1/2 TABLET BY MOUTH EVERY DAY  15 tablet  11  .  atorvastatin (LIPITOR) 10 MG tablet Take 10 mg by mouth daily.      . Cholecalciferol (VITAMIN D) 2000 UNITS CAPS Take 2,000 Units by mouth daily.        . furosemide (LASIX) 40 MG tablet Take 1.5 tablets (60 mg total) by mouth daily.  45 tablet  3  . insulin glargine (LANTUS) 100 UNIT/ML injection Inject 8 Units into the skin daily.       . insulin lispro (HUMALOG) 100 UNIT/ML injection Inject 3 Units into the skin 3 (three) times daily before meals.      . Insulin Syringe-Needle U-100 (INSULIN SYRINGE 1CC/30GX5/16") 30G X 5/16" 1 ML MISC Use four times a day      . Lancets (FREESTYLE) lancets Use three times a day to check blood sugar      . lidocaine (LIDODERM) 5 % Place 1 patch onto the skin as needed. Remove & Discard patch within 12 hours or as directed by MD      . warfarin (COUMADIN) 5 MG tablet Take as directed by anticoagulation clinic  30 tablet  2   No current facility-administered medications for this visit.    Allergies  Allergen Reactions  . Codeine Nausea And Vomiting         Past Medical History  Diagnosis Date  .  Arthritis   . Hyperlipidemia   . 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 Closed fracture of unspecified part of femur 2008  . TIA (transient ischemic attack)   . Edema   . Chronic diastolic heart failure   . Osteoporosis with fracture     compression fx lower T spine and t4, L rib  . Thyroid nodule     dominate R nodule, s/p aspiration>abn path>6 mo obs planned ([facility] 06/01/11)  . GERD (gastroesophageal reflux disease)   . Hypertension 2006  . Atrial fibrillation     chronic anticoag; and AFlutter  . Adenocarcinoma, lung dx 11/08/12    CT chest bx - XRT ongoing  . History of radiation therapy 11/30/2012-12/09/2012    50 gray to right chest  . Diabetes mellitus, type II, insulin dependent 08-15-12    over 40 years ago  . Vertebral compression fracture     Thoracic 12 and Thoracic 7    Past Surgical History  Procedure  Laterality Date  . Right femur  06/25/07    Intramedullary open reduction internal fixation  . Vitrectomy  2007    right eye with cataract repair - dr. Cordelia Pen  . Tonsillectomy  1947    History  Smoking status  . Never Smoker   Smokeless tobacco  . Never Used    History  Alcohol Use No    Family History  Problem Relation Age of Onset  . Hypertension Father     Parent not sure which 1  . Diabetes Brother   . Lung cancer Other   . Cancer Sister   . Parkinsonism Sister     Review of Systems: The review of systems is per the HPI.  All other systems were reviewed and are negative.  Physical Exam: BP 154/80  Pulse 67  Wt 110 lb (49.896 kg) Oxygen sat is 95% at rest. Patient is very pleasant and in no acute distress. She is quite short in stature. Very kyphotic. Skin is warm and dry. Color is quite sallow. HEENT is unremarkable. Normocephalic/atraumatic. PERRL. Sclera are nonicteric. Neck is supple. No masses. No JVD. Lungs are fairly clear but with decreased breath sounds on the left. Cardiac exam shows an irregular rhythm but her rate is ok. Abdomen is soft. Extremities are with just trace edema.  Gait is slow.  No gross neurologic deficits noted.  Wt Readings from Last 3 Encounters:  10/13/13 110 lb (49.896 kg)  09/07/13 115 lb (52.164 kg)  08/08/13 113 lb (51.256 kg)     LABORATORY DATA: Labs pending. CXR pending  Lab Results  Component Value Date   WBC 7.9 06/13/2013   HGB 13.2 06/13/2013   HCT 38.1 06/13/2013   PLT 273 06/13/2013   GLUCOSE 131* 08/08/2013   CHOL 105 06/22/2013   TRIG 63.0 06/22/2013   HDL 51.40 06/22/2013   LDLCALC 41 06/22/2013   ALT 16 07/30/2012   AST 24 07/30/2012   NA 137 08/08/2013   K 4.4 08/08/2013   CL 98 08/08/2013   CREATININE 1.0 08/08/2013   BUN 17 08/08/2013   CO2 33* 08/08/2013   TSH 1.98 08/24/2011   INR 2.0 10/04/2013   HGBA1C 9.0* 03/04/2012   Lab Results  Component Value Date   INR 2.0 10/04/2013   INR 5.2 09/27/2013     INR 2.6 08/30/2013     Assessment / Plan: 1. Dyspnea - has decreased breath sounds noted on the left - will send for CXR. Resting  sat is ok at 95%. Will check with ambulation - this only dropped to 93%. May need to have her CT moved up. Further disposition to follow.   2. AF - rate controlled.   3. Chronic anticoagulation - recheck her labs - she looks anemic on exam.   4. Lung mass - s/p radiation - for repeat scan later this month. She does report weight loss and decreased appetite which is concerning.   Further disposition to follow.   Patient is agreeable to this plan and will call if any problems develop in the interim.   Burtis Junes, RN, [place] [address] [place], [place]  [phone]   Addendum 10/20/13 CT results noted - Ct Chest W Contrast  10/19/2013   IMPRESSION: 1. Interim increase consolidation right lower lobe. Although pneumonia could present in this fashion, recurrent tumor should be considered as these findings are in the region of patient's prior tumor. Associated small right effusion. If the right lower lobe does not clear PET CT can be obtained for further evaluation. 2. New small left effusion. 3. New upper thoracic vertebral body compression fracture in the region of T3. Patient has a history of multiple compression fractures with treatment with kyphoplasty .   Electronically Signed   By: Marcello Moores  Register   On: 10/19/2013 16:04    Have sent a message to Dr. Lisbeth Renshaw - I will treat her with a course of antibiotics Amoxicillin 500 mg TID. Unfortunately, I feel that the etiology of her dyspnea is most likely tumor related. Needs INR on Monday. Needs to keep her follow up with Dr.  Lisbeth Renshaw.   Burtis Junes, RN, [name] [address] [place], [place]  [phone]

## 2013-10-13 NOTE — Telephone Encounter (Signed)
Pt aware of results 

## 2013-10-13 NOTE — Patient Instructions (Signed)
We will check labs today  Please go to [facility] on the first floor for a chest Xray - you may walk in. Based on what this shows, we will then decide what we need to do next  For now, stay on your current medicines  Call the [facility] office at [phone] if you have any questions, problems or concerns.

## 2013-10-13 NOTE — Telephone Encounter (Signed)
Left message to give me a call back regarding test.  Ok to report. Her CXR looks fairly stable for her. We will see what the labs show. Still may need to get her CT scan moved up but will wait til labs are back. No pneumothorax (collapsed lung

## 2013-10-13 NOTE — Telephone Encounter (Signed)
New message    Returning Kim's call

## 2013-10-16 ENCOUNTER — Telehealth: Payer: Self-pay | Admitting: *Deleted

## 2013-10-16 NOTE — Telephone Encounter (Signed)
S/w daughter wanted to make me aware since we r/s ct scan to 3/12 also labs were r/s on 3/12

## 2013-10-17 ENCOUNTER — Ambulatory Visit (INDEPENDENT_AMBULATORY_CARE_PROVIDER_SITE_OTHER): Payer: Medicare Other | Admitting: General Practice

## 2013-10-17 DIAGNOSIS — I4891 Unspecified atrial fibrillation: Secondary | ICD-10-CM

## 2013-10-17 DIAGNOSIS — Z7901 Long term (current) use of anticoagulants: Secondary | ICD-10-CM

## 2013-10-17 LAB — HEMOGLOBIN A1C
HEMOGLOBIN A1C: 7.4 % — AB (ref 4.0–6.0)
Hgb A1c MFr Bld: 7.4 % — AB (ref 4.0–6.0)

## 2013-10-17 LAB — POCT INR: INR: 3.2

## 2013-10-17 LAB — BASIC METABOLIC PANEL: Glucose: 187 mg/dL

## 2013-10-17 NOTE — Progress Notes (Signed)
Pre visit review using our clinic review tool, if applicable. No additional management support is needed unless otherwise documented below in the visit note. 

## 2013-10-19 ENCOUNTER — Telehealth: Payer: Self-pay | Admitting: *Deleted

## 2013-10-19 ENCOUNTER — Ambulatory Visit (HOSPITAL_COMMUNITY)
Admission: RE | Admit: 2013-10-19 | Discharge: 2013-10-19 | Disposition: A | Discharge status: Home / Self Care | Payer: Medicare Other | Source: Ambulatory Visit | Attending: Radiation Oncology | Admitting: Radiation Oncology

## 2013-10-19 ENCOUNTER — Encounter (HOSPITAL_COMMUNITY): Payer: Self-pay

## 2013-10-19 ENCOUNTER — Ambulatory Visit
Admission: RE | Admit: 2013-10-19 | Discharge: 2013-10-19 | Disposition: A | Discharge status: Home / Self Care | Payer: Medicare Other | Source: Ambulatory Visit | Attending: Radiation Oncology | Admitting: Radiation Oncology

## 2013-10-19 DIAGNOSIS — J90 Pleural effusion, not elsewhere classified: Secondary | ICD-10-CM | POA: Insufficient documentation

## 2013-10-19 DIAGNOSIS — C3490 Malignant neoplasm of unspecified part of unspecified bronchus or lung: Secondary | ICD-10-CM | POA: Insufficient documentation

## 2013-10-19 DIAGNOSIS — C3430 Malignant neoplasm of lower lobe, unspecified bronchus or lung: Secondary | ICD-10-CM | POA: Insufficient documentation

## 2013-10-19 LAB — BUN AND CREATININE (CC13)
BUN: 16.7 mg/dL (ref 7.0–26.0)
CREATININE: 1 mg/dL (ref 0.6–1.1)

## 2013-10-19 MED ORDER — IOHEXOL 300 MG/ML  SOLN
80.0000 mL | Freq: Once | INTRAMUSCULAR | Status: AC | PRN
Start: 2013-10-19 — End: 2013-10-19
  Administered 2013-10-19: 80 mL via INTRAVENOUS

## 2013-10-19 NOTE — Telephone Encounter (Signed)
Mellissa return call bck wanted to know does mom need appt for md to fill out  DMV forms. Inform her she can drop forms off and I will call her once they are completed...Johny Chess

## 2013-10-19 NOTE — Telephone Encounter (Signed)
Called pt daughter back no answer LMOM RTC...Wendy Hopkins

## 2013-10-19 NOTE — Telephone Encounter (Signed)
Patient's daughter, Lenna Sciara, phoned needing to speak to [facility] regarding medical review form that patient needs to have completed---OV or can they just drop off form for PCP to complete?  CB# [phone]

## 2013-10-20 ENCOUNTER — Other Ambulatory Visit: Payer: Self-pay | Admitting: *Deleted

## 2013-10-20 ENCOUNTER — Telehealth: Payer: Self-pay | Admitting: *Deleted

## 2013-10-20 MED ORDER — AMOXICILLIN 500 MG PO CAPS: 500.0000 mg | ORAL_CAPSULE | Freq: Three times a day (TID) | ORAL | Status: DC

## 2013-10-20 NOTE — Telephone Encounter (Signed)
S/w pt's daughter is agreeable to plan and aware of results will start Amoxicillin ( 500 ) mg tid for one week sent in to pharm and daughter will call [facility] office to schedule coumadin appt on monday

## 2013-10-23 ENCOUNTER — Telehealth: Payer: Self-pay | Admitting: *Deleted

## 2013-10-23 NOTE — Telephone Encounter (Signed)
On 10-23-13 fax medical records to [facility] it was consult note, end of tx note, follow up note.

## 2013-10-24 ENCOUNTER — Ambulatory Visit (INDEPENDENT_AMBULATORY_CARE_PROVIDER_SITE_OTHER): Payer: Medicare Other | Admitting: General Practice

## 2013-10-24 DIAGNOSIS — I4891 Unspecified atrial fibrillation: Secondary | ICD-10-CM

## 2013-10-24 DIAGNOSIS — Z7901 Long term (current) use of anticoagulants: Secondary | ICD-10-CM

## 2013-10-24 LAB — POCT INR: INR: 1.7

## 2013-10-24 NOTE — Progress Notes (Signed)
Pre visit review using our clinic review tool, if applicable. No additional management support is needed unless otherwise documented below in the visit note. 

## 2013-10-25 NOTE — Telephone Encounter (Signed)
Sent to [employer] to schedule f/u OV w/ Caryl Comes.

## 2013-10-25 NOTE — Telephone Encounter (Signed)
I did not see pt was seen by Ileene Hutchinson, PA in December 2014. Follow up w/ Caryl Comes is recalled for 08/2014. Closing encounter.

## 2013-10-30 DIAGNOSIS — Z0279 Encounter for issue of other medical certificate: Secondary | ICD-10-CM

## 2013-11-01 ENCOUNTER — Ambulatory Visit: Payer: Medicare Other

## 2013-11-01 ENCOUNTER — Ambulatory Visit (HOSPITAL_COMMUNITY): Payer: Medicare Other

## 2013-11-02 ENCOUNTER — Ambulatory Visit
Admission: RE | Admit: 2013-11-02 | Discharge: 2013-11-02 | Disposition: A | Discharge status: Home / Self Care | Payer: Medicare Other | Source: Ambulatory Visit | Attending: Radiation Oncology | Admitting: Radiation Oncology

## 2013-11-02 VITALS — BP 138/69 | HR 70 | Temp 98.0°F | Ht 59.0 in | Wt 108.8 lb

## 2013-11-02 DIAGNOSIS — C3430 Malignant neoplasm of lower lobe, unspecified bronchus or lung: Secondary | ICD-10-CM

## 2013-11-02 NOTE — Progress Notes (Addendum)
Lower back- compressed fracture and she reports pain as a level 5/10.  Placed pillow behind her back.  She reports that when she ambulates she is SOB.  While sitting her O2 sat is 99% on room air.  Travel by wheelchair notes.  She reports voiding frequently yesterday with low abdominal pain, but denies any pain today.  Denies any change in urine and denies any Malodorous urine.   Her abdomen has been "big" for a while and she questions if this causes her difficulty breathing.

## 2013-11-04 NOTE — Addendum Note (Signed)
Encounter addended by: Marye Round, MD on: 11/04/2013  2:33 PM<BR>     Documentation filed: Flowsheet VN, Orders

## 2013-11-04 NOTE — Progress Notes (Signed)
Radiation Oncology         [phone] ________________________________  Name: Wendy Hopkins MRN: [id number]  Date: 11/02/2013  DOB: August 24, 1925  Follow-Up Visit Note  CC: [name], MD  [name], MD  Diagnosis:   Non-small cell lung cancer  Interval Since Last Radiation:  Approximately one year   Narrative:  The patient returns today for routine follow-up.  The patient primarily complains of some shortness of breath. She indicates that she has been given antibiotics for possible pneumonia. Her shortness of breath at night did improve although she notes less improvement during the day. She therefore had a mixed response to this. The patient is aware of new compression fracture which has been discussed with other physicians and is being managed.    The patient had a CT scan of the chest earlier this month and she presents today for discussion of this study.                             ALLERGIES:  is allergic to codeine.  Meds: Current Outpatient Prescriptions  Medication Sig Dispense Refill  . ACCU-CHEK AVIVA test strip 1 each 3 (three) times daily. Check twice daily      . acetaminophen (TYLENOL) 500 MG tablet Take 2 tablets (1,000 mg total) by mouth every 8 (eight) hours as needed for moderate pain.  30 tablet  0  . alendronate (FOSAMAX) 70 MG tablet Take 70 mg by mouth once a week. Take with a full glass of water on an empty stomach.      Marland Kitchen atenolol (TENORMIN) 25 MG tablet TAKE 1/2 TABLET BY MOUTH EVERY DAY  15 tablet  11  . atorvastatin (LIPITOR) 10 MG tablet Take 10 mg by mouth daily.      . Cholecalciferol (VITAMIN D) 2000 UNITS CAPS Take 2,000 Units by mouth daily.        . furosemide (LASIX) 40 MG tablet Take 1.5 tablets (60 mg total) by mouth daily.  45 tablet  3  . insulin glargine (LANTUS) 100 UNIT/ML injection Inject 8 Units into the skin daily.       . insulin lispro (HUMALOG) 100 UNIT/ML injection Inject 3 Units into the skin 3 (three) times daily  before meals.      . Insulin Syringe-Needle U-100 (INSULIN SYRINGE 1CC/30GX5/16") 30G X 5/16" 1 ML MISC Use four times a day      . Lancets (FREESTYLE) lancets Use three times a day to check blood sugar      . warfarin (COUMADIN) 5 MG tablet Take as directed by anticoagulation clinic. As of 11/01/13 Take 1 tablet Mon. and Friday.  Take 1/2 tab on Tues., Wed., Thursday, Saturday and Sunday      . amoxicillin (AMOXIL) 500 MG capsule Take 1 capsule (500 mg total) by mouth 3 (three) times daily.  21 capsule  0   No current facility-administered medications for this encounter.    Physical Findings: The patient is in no acute distress. Patient is alert and oriented.  height is 4\' 11"  (1.499 m) and weight is 108 lb 12.8 oz (49.351 kg). Her temperature is 98 F (36.7 C). Her blood pressure is 138/69 and her pulse is 70. Her oxygen saturation is 99%. .   General: Sitting in a wheelchair, in no acute distress HEENT: Normocephalic, atraumatic Cardiovascular: Regular rate and rhythm Respiratory: Clear to auscultation bilaterally GI: Soft, nontender, normal bowel sounds Extremities: No edema  present   Lab Findings: Lab Results  Component Value Date   WBC 5.8 10/13/2013   HGB 13.3 10/13/2013   HCT 40.7 10/13/2013   MCV 90.7 10/13/2013   PLT 264.0 10/13/2013     Radiographic Findings: Dg Chest 2 View  10/13/2013   CLINICAL DATA:  Dyspnea.  EXAM: CHEST  2 VIEW  COMPARISON:  September 07, 2013.  FINDINGS: Stable cardiomediastinal silhouette. Multiple old thoracic compression fractures are noted which are stable compared to prior exam. Status post kyphoplasty at 2 levels. Moderately displaced old sternal fracture is noted. Bilateral old rib fractures are noted. Minimal bilateral pleural effusions are noted. Bilateral basilar opacities are noted concerning for scarring or subsegmental atelectasis.  IMPRESSION: Stable mild bilateral basilar opacities concerning for scarring or subsegmental atelectasis with  associated minimal pleural effusions. Stable old sternal fracture and bilateral rib fractures, as well as multiple old thoracic compression fractures.   Electronically Signed   By: Sabino Dick M.D.   On: 10/13/2013 16:26   Ct Chest W Contrast  10/19/2013   CLINICAL DATA:  Lung cancer.  EXAM: CT CHEST WITH CONTRAST  TECHNIQUE: Multidetector CT imaging of the chest was performed during intravenous contrast administration.  CONTRAST:  33mL OMNIPAQUE IOHEXOL 300 MG/ML  SOLN  COMPARISON:  DG CHEST 2 VIEW dated 10/13/2013  FINDINGS: Thoracic aorta is atherosclerotic. No aneurysm or dissection. Pulmonary arteries are normal. No pulmonary embolus. Cardiomegaly. Coronary artery disease.  Shotty mediastinal lymph nodes.  Thoracic esophagus unremarkable.  Right lower lobe increased consolidation is present. Although pneumonia could present in this fashion, recurrent tumor should be considered as this finding is in the region the patient's prior tumor. Associated small right pleural effusion. Small new left pleural effusion present.  Adrenals normal.  Splenosis.  Calcified granuloma left hepatic lobe.  Thyroid unremarkable. Supraclavicular and axillary regions unremarkable. Chest wall is intact. Severe degenerative changes and kyphosis thoracic spine. Prior multiple kyphoplasties. New upper thoracic vertebral body compression fracture is present. This appears at approximately level of T3. Old fracture of the sternum with chronic malalignment noted.  IMPRESSION: 1. Interim increase consolidation right lower lobe. Although pneumonia could present in this fashion, recurrent tumor should be considered as these findings are in the region of patient's prior tumor. Associated small right effusion. If the right lower lobe does not clear PET CT can be obtained for further evaluation. 2. New small left effusion. 3. New upper thoracic vertebral body compression fracture in the region of T3. Patient has a history of multiple compression  fractures with treatment with kyphoplasty .   Electronically Signed   By: Marcello Moores  Register   On: 10/19/2013 16:04    Impression/plan: We discussed the patient's recent CT scan of the chest on 10/19/2013.  I were personally reviewed this study. This shows some consolidation within the right lower lobe. Differential includes pneumonia versus tumor versus radiation change as the primary possibilities. The patient has received antibiotics since that time. I recommended for the patient to undergo a repeat CT scan of the chest in 6 weeks. I discussed this with the patient and the possibility of further workup if this area persists or demonstrates progression.     Jodelle Gross, M.D., Ph.D.

## 2013-11-06 ENCOUNTER — Telehealth: Payer: Self-pay | Admitting: *Deleted

## 2013-11-06 NOTE — Telephone Encounter (Signed)
Called patient to inform of test and fu, lvm for a return call

## 2013-11-07 ENCOUNTER — Encounter: Payer: Self-pay | Admitting: Internal Medicine

## 2013-11-08 ENCOUNTER — Other Ambulatory Visit (INDEPENDENT_AMBULATORY_CARE_PROVIDER_SITE_OTHER): Payer: Medicare Other

## 2013-11-08 ENCOUNTER — Encounter: Payer: Self-pay | Admitting: Internal Medicine

## 2013-11-08 ENCOUNTER — Ambulatory Visit (INDEPENDENT_AMBULATORY_CARE_PROVIDER_SITE_OTHER)
Admission: RE | Admit: 2013-11-08 | Discharge: 2013-11-08 | Disposition: A | Discharge status: Home / Self Care | Payer: Medicare Other | Source: Ambulatory Visit | Attending: Internal Medicine | Admitting: Internal Medicine

## 2013-11-08 ENCOUNTER — Ambulatory Visit (INDEPENDENT_AMBULATORY_CARE_PROVIDER_SITE_OTHER): Payer: Medicare Other | Admitting: Internal Medicine

## 2013-11-08 VITALS — BP 102/60 | HR 68 | Temp 96.6°F | Resp 16 | Wt 109.0 lb

## 2013-11-08 DIAGNOSIS — E119 Type 2 diabetes mellitus without complications: Secondary | ICD-10-CM

## 2013-11-08 DIAGNOSIS — R300 Dysuria: Secondary | ICD-10-CM | POA: Insufficient documentation

## 2013-11-08 DIAGNOSIS — Z0000 Encounter for general adult medical examination without abnormal findings: Secondary | ICD-10-CM

## 2013-11-08 DIAGNOSIS — K5900 Constipation, unspecified: Secondary | ICD-10-CM

## 2013-11-08 DIAGNOSIS — R10817 Generalized abdominal tenderness: Secondary | ICD-10-CM

## 2013-11-08 DIAGNOSIS — Z794 Long term (current) use of insulin: Secondary | ICD-10-CM

## 2013-11-08 DIAGNOSIS — N390 Urinary tract infection, site not specified: Secondary | ICD-10-CM

## 2013-11-08 LAB — URINALYSIS, ROUTINE W REFLEX MICROSCOPIC
Bilirubin Urine: NEGATIVE
Hgb urine dipstick: NEGATIVE
Ketones, ur: NEGATIVE
Leukocytes, UA: NEGATIVE
NITRITE: NEGATIVE
SPECIFIC GRAVITY, URINE: 1.01 (ref 1.000–1.030)
TOTAL PROTEIN, URINE-UPE24: NEGATIVE
URINE GLUCOSE: NEGATIVE
Urobilinogen, UA: 0.2 (ref 0.0–1.0)
pH: 6.5 (ref 5.0–8.0)

## 2013-11-08 LAB — CBC WITH DIFFERENTIAL/PLATELET
Basophils Absolute: 0 10*3/uL (ref 0.0–0.1)
Basophils Relative: 0.5 % (ref 0.0–3.0)
EOS PCT: 0.5 % (ref 0.0–5.0)
Eosinophils Absolute: 0 10*3/uL (ref 0.0–0.7)
HEMATOCRIT: 40.7 % (ref 36.0–46.0)
Hemoglobin: 13.5 g/dL (ref 12.0–15.0)
Lymphocytes Relative: 12.7 % (ref 12.0–46.0)
Lymphs Abs: 0.8 10*3/uL (ref 0.7–4.0)
MCHC: 33.2 g/dL (ref 30.0–36.0)
MCV: 91.2 fl (ref 78.0–100.0)
MONOS PCT: 10.4 % (ref 3.0–12.0)
Monocytes Absolute: 0.6 10*3/uL (ref 0.1–1.0)
NEUTROS ABS: 4.7 10*3/uL (ref 1.4–7.7)
Neutrophils Relative %: 75.9 % (ref 43.0–77.0)
Platelets: 221 10*3/uL (ref 150.0–400.0)
RBC: 4.47 Mil/uL (ref 3.87–5.11)
RDW: 17.6 % — AB (ref 11.5–14.6)
WBC: 6.2 10*3/uL (ref 4.5–10.5)

## 2013-11-08 LAB — COMPREHENSIVE METABOLIC PANEL
ALT: 12 U/L (ref 0–35)
AST: 18 U/L (ref 0–37)
Albumin: 3.7 g/dL (ref 3.5–5.2)
Alkaline Phosphatase: 105 U/L (ref 39–117)
BILIRUBIN TOTAL: 0.8 mg/dL (ref 0.3–1.2)
BUN: 18 mg/dL (ref 6–23)
CO2: 29 meq/L (ref 19–32)
CREATININE: 1.2 mg/dL (ref 0.4–1.2)
Calcium: 9.3 mg/dL (ref 8.4–10.5)
Chloride: 95 mEq/L — ABNORMAL LOW (ref 96–112)
GFR: 45.34 mL/min — AB (ref >60.00)
Glucose, Bld: 165 mg/dL — ABNORMAL HIGH (ref 70–99)
Potassium: 4.9 mEq/L (ref 3.5–5.1)
Sodium: 132 mEq/L — ABNORMAL LOW (ref 135–145)
Total Protein: 6.7 g/dL (ref 6.0–8.3)

## 2013-11-08 LAB — AMYLASE: Amylase: 73 U/L (ref 27–131)

## 2013-11-08 LAB — HEMOGLOBIN A1C: Hgb A1c MFr Bld: 8 % — ABNORMAL HIGH (ref 4.6–6.5)

## 2013-11-08 LAB — TSH: TSH: 2.55 u[IU]/mL (ref 0.35–5.50)

## 2013-11-08 LAB — LIPASE: LIPASE: 19 U/L (ref 11.0–59.0)

## 2013-11-08 MED ORDER — LINACLOTIDE 145 MCG PO CAPS: 145.0000 ug | ORAL_CAPSULE | Freq: Every day | ORAL | Status: DC

## 2013-11-08 NOTE — Patient Instructions (Signed)
Abdominal Pain, Adult °Many things can cause abdominal pain. Usually, abdominal pain is not caused by a disease and will improve without treatment. It can often be observed and treated at home. Your health care provider will do a physical exam and possibly order blood tests and X-rays to help determine the seriousness of your pain. However, in many cases, more time must pass before a clear cause of the pain can be found. Before that point, your health care provider may not know if you need more testing or further treatment. °HOME CARE INSTRUCTIONS  °Monitor your abdominal pain for any changes. The following actions may help to alleviate any discomfort you are experiencing: °· Only take over-the-counter or prescription medicines as directed by your health care provider. °· Do not take laxatives unless directed to do so by your health care provider. °· Try a clear liquid diet (broth, tea, or water) as directed by your health care provider. Slowly move to a bland diet as tolerated. °SEEK MEDICAL CARE IF: °· You have unexplained abdominal pain. °· You have abdominal pain associated with nausea or diarrhea. °· You have pain when you urinate or have a bowel movement. °· You experience abdominal pain that wakes you in the night. °· You have abdominal pain that is worsened or improved by eating food. °· You have abdominal pain that is worsened with eating fatty foods. °SEEK IMMEDIATE MEDICAL CARE IF:  °· Your pain does not go away within 2 hours. °· You have a fever. °· You keep throwing up (vomiting). °· Your pain is felt only in portions of the abdomen, such as the right side or the left lower portion of the abdomen. °· You pass bloody or black tarry stools. °MAKE SURE YOU: °· Understand these instructions.   °· Will watch your condition.   °· Will get help right away if you are not doing well or get worse.   °Document Released: 05/06/2005 Document Revised: 05/17/2013 Document Reviewed: 04/05/2013 °[employer]® Patient  Information ©2014 [employer]. ° °

## 2013-11-08 NOTE — Assessment & Plan Note (Signed)
I will check her labs to screen for secondary causes For now, will treat with linzess

## 2013-11-08 NOTE — Progress Notes (Signed)
Pre visit review using our clinic review tool, if applicable. No additional management support is needed unless otherwise documented below in the visit note. 

## 2013-11-08 NOTE — Assessment & Plan Note (Signed)
I will check a plain film today to look for free air, ileus, stool retention, SBO, etc I will also look at her labs to see if there is any organic pathology I think the constipation if the main problem so I have asked her to start linzess

## 2013-11-08 NOTE — Assessment & Plan Note (Signed)
She is due for an A1C today 

## 2013-11-08 NOTE — Progress Notes (Signed)
Subjective:    Patient ID: Wendy Hopkins, female    DOB: 1925-08-27, 80 y.o.   MRN: [id number]  Abdominal Pain This is a recurrent problem. The current episode started 1 to 4 weeks ago. The problem occurs intermittently. The problem has been unchanged. The pain is located in the generalized abdominal region. The pain is at a severity of 1/10. The pain is mild. The quality of the pain is aching and burning. The abdominal pain does not radiate. Associated symptoms include constipation, dysuria and frequency. Pertinent negatives include no anorexia, arthralgias, diarrhea, fever, flatus, headaches, hematochezia, hematuria, melena, myalgias, nausea, vomiting or weight loss. Nothing aggravates the pain. The pain is relieved by nothing. Treatments tried: miralax. The treatment provided no relief. There is no history of abdominal surgery, colon cancer, Crohn's disease, gallstones, GERD, irritable bowel syndrome, pancreatitis, PUD or ulcerative colitis.      Review of Systems  Constitutional: Negative.  Negative for fever, chills, weight loss, diaphoresis, appetite change and fatigue.  HENT: Negative.   Eyes: Negative.   Respiratory: Positive for shortness of breath. Negative for cough, choking, chest tightness, wheezing and stridor.   Cardiovascular: Negative.  Negative for chest pain, palpitations and leg swelling.  Gastrointestinal: Positive for abdominal pain and constipation. Negative for nausea, vomiting, diarrhea, blood in stool, melena, hematochezia, abdominal distention, anal bleeding, rectal pain, anorexia and flatus.  Endocrine: Negative.   Genitourinary: Positive for dysuria and frequency. Negative for urgency, hematuria, flank pain, decreased urine volume, vaginal bleeding, enuresis, difficulty urinating, pelvic pain and dyspareunia.  Musculoskeletal: Negative.  Negative for arthralgias, back pain, myalgias, neck pain and neck stiffness.  Skin: Negative.   Allergic/Immunologic:  Negative.   Neurological: Negative.  Negative for headaches.  Hematological: Negative.  Negative for adenopathy. Does not bruise/bleed easily.  Psychiatric/Behavioral: Negative.        Objective:   Physical Exam  Vitals reviewed. Constitutional: She is oriented to person, place, and time. She appears well-developed and well-nourished.  Non-toxic appearance. She does not have a sickly appearance. She does not appear ill. No distress.  HENT:  Head: Normocephalic and atraumatic.  Mouth/Throat: Oropharynx is clear and moist. No oropharyngeal exudate.  Eyes: Conjunctivae are normal. Right eye exhibits no discharge. Left eye exhibits no discharge. No scleral icterus.  Neck: Normal range of motion. Neck supple. No JVD present. No tracheal deviation present. No thyromegaly present.  Cardiovascular: Normal rate, regular rhythm, normal heart sounds and intact distal pulses.  Exam reveals no gallop and no friction rub.   No murmur heard. Pulmonary/Chest: Effort normal and breath sounds normal. No stridor. No respiratory distress. She has no wheezes. She has no rales. She exhibits no tenderness.  Abdominal: Soft. Normal appearance and bowel sounds are normal. She exhibits no shifting dullness, no distension, no abdominal bruit, no ascites, no pulsatile midline mass and no mass. There is no hepatosplenomegaly, splenomegaly or hepatomegaly. There is tenderness in the periumbilical area, suprapubic area and left upper quadrant. There is no rigidity, no rebound, no guarding, no CVA tenderness, no tenderness at McBurney's point and negative Murphy's sign. No hernia. Hernia confirmed negative in the ventral area, confirmed negative in the right inguinal area and confirmed negative in the left inguinal area.  Musculoskeletal: Normal range of motion. She exhibits no edema and no tenderness.  Lymphadenopathy:    She has no cervical adenopathy.  Neurological: She is oriented to person, place, and time.  Skin: Skin  is warm and dry. No rash noted. She is not  diaphoretic. No erythema. No pallor.  Psychiatric: She has a normal mood and affect. Her behavior is normal. Judgment and thought content normal.     Lab Results  Component Value Date   WBC 5.8 10/13/2013   HGB 13.3 10/13/2013   HCT 40.7 10/13/2013   PLT 264.0 10/13/2013   GLUCOSE 187* 10/13/2013   CHOL 105 06/22/2013   TRIG 63.0 06/22/2013   HDL 51.40 06/22/2013   LDLCALC 41 06/22/2013   ALT 17 07/17/2013   AST 18 07/17/2013   NA 132* 10/13/2013   K 5.1 10/13/2013   CL 94* 10/13/2013   CREATININE 1.0 10/19/2013   BUN 16.7 10/19/2013   CO2 30 10/13/2013   TSH 2.14 07/17/2013   INR 1.7 10/24/2013   HGBA1C 7.4* 10/17/2013       Assessment & Plan:

## 2013-11-08 NOTE — Assessment & Plan Note (Signed)
I will check her UA and urine culture to see if there is blood, glc, infection, etc.

## 2013-11-09 ENCOUNTER — Encounter: Payer: Self-pay | Admitting: Internal Medicine

## 2013-11-10 LAB — CULTURE, URINE COMPREHENSIVE: Colony Count: >=100000

## 2013-11-10 MED ORDER — AMPICILLIN 250 MG PO CAPS: 250.0000 mg | ORAL_CAPSULE | Freq: Four times a day (QID) | ORAL | Status: DC

## 2013-11-10 NOTE — Addendum Note (Signed)
Addended by: Janith Lima on: 11/10/2013 10:27 AM   Modules accepted: Orders, Medications

## 2013-11-10 NOTE — Assessment & Plan Note (Signed)
Her urine culture is positive for gp B strep so I have asked her to start ampicillin

## 2013-11-14 ENCOUNTER — Ambulatory Visit (INDEPENDENT_AMBULATORY_CARE_PROVIDER_SITE_OTHER): Payer: Medicare Other | Admitting: General Practice

## 2013-11-14 ENCOUNTER — Telehealth: Payer: Self-pay | Admitting: *Deleted

## 2013-11-14 DIAGNOSIS — I4891 Unspecified atrial fibrillation: Secondary | ICD-10-CM

## 2013-11-14 DIAGNOSIS — Z5181 Encounter for therapeutic drug level monitoring: Secondary | ICD-10-CM

## 2013-11-14 DIAGNOSIS — G459 Transient cerebral ischemic attack, unspecified: Secondary | ICD-10-CM

## 2013-11-14 DIAGNOSIS — Z7901 Long term (current) use of anticoagulants: Secondary | ICD-10-CM

## 2013-11-14 LAB — POCT INR: INR: 1.8

## 2013-11-14 NOTE — Telephone Encounter (Signed)
[name], pts daughter, called states pt has not had a bowel movement in 4 days even with taking the Linzess.  Requesting whether pt should DC Linzess and return to Miralax and Benefiber.  Pt was seen on 4.1.15 by Dr Ronnald Ramp and prescribed Linzess. Please advise

## 2013-11-14 NOTE — Telephone Encounter (Signed)
Spoke with pts daughter.  Advised of MDs message

## 2013-11-14 NOTE — Telephone Encounter (Signed)
Yes - DC Linzess Resume Miralax TID and benefiber as before Also prune juice

## 2013-11-14 NOTE — Progress Notes (Signed)
Pre visit review using our clinic review tool, if applicable. No additional management support is needed unless otherwise documented below in the visit note. 

## 2013-11-21 ENCOUNTER — Encounter: Payer: Self-pay | Admitting: Internal Medicine

## 2013-11-21 ENCOUNTER — Ambulatory Visit (INDEPENDENT_AMBULATORY_CARE_PROVIDER_SITE_OTHER): Payer: Medicare Other | Admitting: Internal Medicine

## 2013-11-21 ENCOUNTER — Other Ambulatory Visit (INDEPENDENT_AMBULATORY_CARE_PROVIDER_SITE_OTHER): Payer: Medicare Other

## 2013-11-21 VITALS — BP 120/62 | HR 77 | Temp 97.6°F | Wt 110.0 lb

## 2013-11-21 DIAGNOSIS — R300 Dysuria: Secondary | ICD-10-CM

## 2013-11-21 DIAGNOSIS — C3490 Malignant neoplasm of unspecified part of unspecified bronchus or lung: Secondary | ICD-10-CM

## 2013-11-21 DIAGNOSIS — R10817 Generalized abdominal tenderness: Secondary | ICD-10-CM

## 2013-11-21 DIAGNOSIS — R9389 Abnormal findings on diagnostic imaging of other specified body structures: Secondary | ICD-10-CM

## 2013-11-21 LAB — URINALYSIS, ROUTINE W REFLEX MICROSCOPIC
Bilirubin Urine: NEGATIVE
KETONES UR: NEGATIVE
LEUKOCYTES UA: NEGATIVE
Nitrite: NEGATIVE
SPECIFIC GRAVITY, URINE: 1.01 (ref 1.000–1.030)
Total Protein, Urine: NEGATIVE
URINE GLUCOSE: NEGATIVE
Urobilinogen, UA: 0.2 (ref 0.0–1.0)
pH: 6.5 (ref 5.0–8.0)

## 2013-11-21 NOTE — Patient Instructions (Signed)
It was good to see you today.  We have reviewed your prior records including labs and tests today  Test(s) ordered today. Your results will be released to [place] (or called to you) after review, usually within 72hours after test completion. If any changes need to be made, you will be notified at that same time.  Medications reviewed and updated, no changes recommended at this time.  Please schedule followup in 3-4 months, call sooner if problems.

## 2013-11-21 NOTE — Progress Notes (Signed)
Subjective:    Patient ID: Wendy Hopkins, female    DOB: 03-15-1926, 80 y.o.   MRN: [id number]  HPI  Patient is here for follow up  Reviewed chronic medical issues and interval medical events  Past Medical History  Diagnosis Date  . Arthritis   . Hyperlipidemia   . 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 Closed fracture of unspecified part of femur 2008  . TIA (transient ischemic attack)   . Edema   . Chronic diastolic heart failure   . Osteoporosis with fracture     compression fx lower T spine and t4, L rib  . Thyroid nodule     dominate R nodule, s/p aspiration>abn path>6 mo obs planned ([facility] 06/01/11)  . GERD (gastroesophageal reflux disease)   . Hypertension 2006  . Atrial fibrillation     chronic anticoag; and AFlutter  . Adenocarcinoma, lung dx 11/08/12    CT chest bx - XRT ongoing  . History of radiation therapy 11/30/2012-12/09/2012    50 gray to right chest  . Diabetes mellitus, type II, insulin dependent 08-15-12    over 40 years ago  . Vertebral compression fracture     Thoracic 12 and Thoracic 7    Review of Systems  Constitutional: Positive for fatigue. Negative for fever and unexpected weight change.  Respiratory: Positive for shortness of breath (w/ exertion). Negative for cough and chest tightness.   Cardiovascular: Negative for chest pain and leg swelling.       Objective:   Physical Exam  BP 120/62  Pulse 77  Temp(Src) 97.6 F (36.4 C) (Oral)  Wt 110 lb (49.896 kg)  SpO2 98% Wt Readings from Last 3 Encounters:  11/21/13 110 lb (49.896 kg)  11/08/13 109 lb (49.442 kg)  11/02/13 108 lb 12.8 oz (49.351 kg)   Constitutional: She appears well-developed and well-nourished. No distress. dtr melissa at side Neck: Normal range of motion. Neck supple. No JVD present. No thyromegaly present.  Cardiovascular: Normal rate, regular rhythm and normal heart sounds.  No murmur heard. No BLE edema. Pulmonary/Chest: Effort normal  and breath sounds normal. No respiratory distress. She has no wheezes.  Abdomen: distended but SNT, +BS Psychiatric: She has a dysphoric mood and affect. Her behavior is normal. Judgment and thought content normal.   Lab Results  Component Value Date   WBC 6.2 11/08/2013   HGB 13.5 11/08/2013   HCT 40.7 11/08/2013   PLT 221.0 11/08/2013   GLUCOSE 165* 11/08/2013   CHOL 105 06/22/2013   TRIG 63.0 06/22/2013   HDL 51.40 06/22/2013   LDLCALC 41 06/22/2013   ALT 12 11/08/2013   AST 18 11/08/2013   NA 132* 11/08/2013   K 4.9 11/08/2013   CL 95* 11/08/2013   CREATININE 1.2 11/08/2013   BUN 18 11/08/2013   CO2 29 11/08/2013   TSH 2.55 11/08/2013   INR 1.8 11/14/2013   HGBA1C 8.0* 11/08/2013    Dg Abd Acute W/chest  11/08/2013   CLINICAL DATA:  80 year old female with abdominal pain, burning. Distension and shortness of Breath. Initial encounter. History of lung cancer.  EXAM: ACUTE ABDOMEN SERIES (ABDOMEN 2 VIEW & CHEST 1 VIEW)  COMPARISON:  Chest CT 10/19/2013 and earlier.  PET-CT 05/01/2013.  FINDINGS: Persisting confluent opacity at the right lung base, not significantly changed since the recent CT. Stable cardiac size and mediastinal contours. No pneumothorax or pulmonary edema. No definite effusion. Multiple chronic right lateral rib fractures re- identified. Osteopenia.  Previous augmentation of thoracolumbar spinal compression fractures. Non obstructed bowel gas pattern. Mild volume of retained stool in the distal colon. No pneumoperitoneum identified. Stable, benign proximal left femur chondroid appearing lesion.  IMPRESSION: 1.  Non obstructed bowel gas pattern, no free air. 2. Stable increased right lung base opacity since 10/19/2013 CT (please see that report).   Electronically Signed   By: Lars Pinks M.D.   On: 11/08/2013 13:05   10/19/13 CT chest: IMPRESSION: 1. Interim increase consolidation right lower lobe. Although pneumonia could present in this fashion, recurrent tumor should be considered as these findings  are in the region of patient's prior tumor. Associated small right effusion. If the right lower lobe does not clear PET CT can be obtained for further evaluation. 2. New small left effusion. 3. New upper thoracic vertebral body compression fracture in the region of T3. Patient has a history of multiple compression fractures with treatment with kyphoplasty . Electronically Signed By: Marcello Moores Register On: 10/19/2013 16:04       Assessment & Plan:   Problem List Items Addressed This Visit   Abdominal tenderness, generalized     Distention - expect constipation eval and labs by TJ 11/08/13 reviewed - unremarkable Encouraged Benefiber, miralax and prune juice as needed    Abnormal CT scan, chest     Reviewed tx for PNA and plans for follow up CT 12/18/13 as per rad onc If remains abn, will plan follow up PET given ca hx    Adenocarcinoma, lung     Dx clarified 11/08/12 with CT chest bx Not surgical candidate - s/p 5 XRT tx thru 12/2012 - following with rad onc and pulm Interval hx reviewed - PET 04/2013 reviewed and CT chest 10/2013    RESOLVED: Dysuria - Primary   Relevant Orders      Urinalysis, Routine w reflex microscopic

## 2013-11-21 NOTE — Progress Notes (Signed)
Pre visit review using our clinic review tool, if applicable. No additional management support is needed unless otherwise documented below in the visit note. 

## 2013-11-21 NOTE — Assessment & Plan Note (Signed)
Distention - expect constipation eval and labs by TJ 11/08/13 reviewed - unremarkable Encouraged Benefiber, miralax and prune juice as needed

## 2013-11-21 NOTE — Assessment & Plan Note (Signed)
Dx clarified 11/08/12 with CT chest bx Not surgical candidate - s/p 5 XRT tx thru 12/2012 - following with rad onc and pulm Interval hx reviewed - PET 04/2013 reviewed and CT chest 10/2013

## 2013-11-21 NOTE — Assessment & Plan Note (Signed)
Reviewed tx for PNA and plans for follow up CT 12/18/13 as per rad onc If remains abn, will plan follow up PET given ca hx

## 2013-11-30 ENCOUNTER — Other Ambulatory Visit: Payer: Self-pay | Admitting: General Practice

## 2013-11-30 ENCOUNTER — Other Ambulatory Visit: Payer: Self-pay | Admitting: Internal Medicine

## 2013-11-30 MED ORDER — WARFARIN SODIUM 5 MG PO TABS: ORAL_TABLET | ORAL | Status: DC

## 2013-12-12 ENCOUNTER — Ambulatory Visit (INDEPENDENT_AMBULATORY_CARE_PROVIDER_SITE_OTHER): Payer: Medicare Other | Admitting: General Practice

## 2013-12-12 DIAGNOSIS — Z5181 Encounter for therapeutic drug level monitoring: Secondary | ICD-10-CM

## 2013-12-12 DIAGNOSIS — I4891 Unspecified atrial fibrillation: Secondary | ICD-10-CM

## 2013-12-12 DIAGNOSIS — Z7901 Long term (current) use of anticoagulants: Secondary | ICD-10-CM

## 2013-12-12 DIAGNOSIS — G459 Transient cerebral ischemic attack, unspecified: Secondary | ICD-10-CM

## 2013-12-12 LAB — POCT INR: INR: 1.9

## 2013-12-12 NOTE — Progress Notes (Signed)
Pre visit review using our clinic review tool, if applicable. No additional management support is needed unless otherwise documented below in the visit note. 

## 2013-12-18 ENCOUNTER — Ambulatory Visit (HOSPITAL_COMMUNITY)
Admission: RE | Admit: 2013-12-18 | Discharge: 2013-12-18 | Disposition: A | Discharge status: Home / Self Care | Payer: Medicare Other | Source: Ambulatory Visit | Attending: Radiation Oncology | Admitting: Radiation Oncology

## 2013-12-18 ENCOUNTER — Encounter (HOSPITAL_COMMUNITY): Payer: Self-pay

## 2013-12-18 ENCOUNTER — Ambulatory Visit
Admission: RE | Admit: 2013-12-18 | Discharge: 2013-12-18 | Disposition: A | Discharge status: Home / Self Care | Payer: Medicare Other | Source: Ambulatory Visit | Attending: Radiation Oncology | Admitting: Radiation Oncology

## 2013-12-18 DIAGNOSIS — C3430 Malignant neoplasm of lower lobe, unspecified bronchus or lung: Secondary | ICD-10-CM | POA: Insufficient documentation

## 2013-12-18 DIAGNOSIS — Z923 Personal history of irradiation: Secondary | ICD-10-CM | POA: Insufficient documentation

## 2013-12-18 LAB — BUN AND CREATININE (CC13)
BUN: 16.6 mg/dL (ref 7.0–26.0)
Creatinine: 1 mg/dL (ref 0.6–1.1)

## 2013-12-18 MED ORDER — IOHEXOL 300 MG/ML  SOLN
80.0000 mL | Freq: Once | INTRAMUSCULAR | Status: AC | PRN
Start: 2013-12-18 — End: 2013-12-18
  Administered 2013-12-18: 80 mL via INTRAVENOUS

## 2013-12-20 ENCOUNTER — Other Ambulatory Visit: Payer: Self-pay | Admitting: Cardiology

## 2013-12-21 ENCOUNTER — Ambulatory Visit
Admission: RE | Admit: 2013-12-21 | Discharge: 2013-12-21 | Disposition: A | Discharge status: Home / Self Care | Payer: Medicare Other | Source: Ambulatory Visit | Attending: Radiation Oncology | Admitting: Radiation Oncology

## 2013-12-21 ENCOUNTER — Encounter: Payer: Self-pay | Admitting: Radiation Oncology

## 2013-12-21 VITALS — BP 130/64 | HR 75 | Temp 97.6°F | Resp 20 | Wt 109.1 lb

## 2013-12-21 DIAGNOSIS — C3430 Malignant neoplasm of lower lobe, unspecified bronchus or lung: Secondary | ICD-10-CM

## 2013-12-21 NOTE — Progress Notes (Signed)
Follow up rad tx chest 11/30/12-12/09/12, had CT Chest 12/18/13 here for results, sob with exertion,gave w/c for patient when met at elevator, non prod cough, no dizzyness, nausea, appetite poor, drinking coffee and water , completed amoxicillin recently , constipated, small bm's, not sleeping well, nothing to take for that, c/o abdominal pain 2-3 on 1-10 scale, 3:41 PM  .3:41 PM

## 2013-12-21 NOTE — Progress Notes (Signed)
Radiation Oncology         [phone] ________________________________  Name: Wendy Hopkins MRN: [id number]  Date: 12/21/2013  DOB: [birth year]  Follow-Up Visit Note  CC: [name], MD  [name], MD  Diagnosis:   Non-small cell lung cancer of the right lower lobe status post stereotactic body radiation treatment  Interval Since Last Radiation:  One year   Narrative:  The patient returns today for routine follow-up.  The patient does complain of some shortness of breath. This is most noticeable with exertion but is seen if she has any health walking or pushes for instance a shopping cart. No major change. The patient has an occasional nonproductive cough. No known fevers. The patient underwent a recent CT scan of the chest presents today for review of this study.                              ALLERGIES:  is allergic to codeine.  Meds: Current Outpatient Prescriptions  Medication Sig Dispense Refill  . ACCU-CHEK AVIVA test strip 1 each 3 (three) times daily. Check twice daily      . alendronate (FOSAMAX) 70 MG tablet Take 70 mg by mouth once a week. Take with a full glass of water on an empty stomach.      Marland Kitchen atenolol (TENORMIN) 25 MG tablet TAKE 1/2 TABLET BY MOUTH EVERY DAY  15 tablet  11  . atorvastatin (LIPITOR) 10 MG tablet Take 10 mg by mouth daily.      . Cholecalciferol (VITAMIN D) 2000 UNITS CAPS Take 2,000 Units by mouth daily.        . furosemide (LASIX) 40 MG tablet TAKE 1& 1/2 TABLET BY MOUTH DAILY  45 tablet  0  . insulin glargine (LANTUS) 100 UNIT/ML injection Inject 8 Units into the skin daily.       . insulin lispro (HUMALOG) 100 UNIT/ML injection Inject 3 Units into the skin 3 (three) times daily before meals.      . Insulin Syringe-Needle U-100 (INSULIN SYRINGE 1CC/30GX5/16") 30G X 5/16" 1 ML MISC Use four times a day      . Lancets (FREESTYLE) lancets Use three times a day to check blood sugar      . warfarin (COUMADIN) 5 MG tablet Take as  directed by anticoagulation clinic.  30 tablet  3  . acetaminophen (TYLENOL) 500 MG tablet Take 2 tablets (1,000 mg total) by mouth every 8 (eight) hours as needed for moderate pain.  30 tablet  0   No current facility-administered medications for this encounter.    Physical Findings: The patient is in no acute distress. Patient is alert and oriented.  weight is 109 lb 1.6 oz (49.487 kg). Her oral temperature is 97.6 F (36.4 C). Her blood pressure is 130/64 and her pulse is 75. Her respiration is 20 and oxygen saturation is 100%. .   General: Well-developed, in no acute distress HEENT: Normocephalic, atraumatic Cardiovascular: Regular rate and rhythm Respiratory: Clear to auscultation bilaterally GI: Soft, nontender, normal bowel sounds Extremities: No edema present   Lab Findings: Lab Results  Component Value Date   WBC 6.2 11/08/2013   HGB 13.5 11/08/2013   HCT 40.7 11/08/2013   MCV 91.2 11/08/2013   PLT 221.0 11/08/2013     Radiographic Findings: Ct Chest W Contrast  12/18/2013   CLINICAL DATA:  Lung cancer diagnosed April 2014, completed radiation therapy. Shortness of breath  EXAM: CT CHEST WITH CONTRAST  TECHNIQUE: Multidetector CT imaging of the chest was performed during intravenous contrast administration.  CONTRAST:  33mL OMNIPAQUE IOHEXOL 300 MG/ML  SOLN  COMPARISON:  DG ABD ACUTE W/CHEST dated 11/08/2013; CT CHEST W/CM dated 10/19/2013; NM PET IMAGE INITIAL (PI) SKULL BASE TO THIGH dated 10/18/2012; CT CHEST W/CM dated 04/13/2013  FINDINGS: Streak artifact from dental amalgam is noted at the lung apices. The patient is kyphotic. Small bilateral, right greater than left, pleural effusions, with associated element of loculation reidentified. Mild motion artifact is present. Allowing for this, central airways are patent. Mild central bronchial wall thickening is stable. Minimal dependent left lower lobe atelectasis is present. Again noted is tissue masslike thickening in the right middle  lobe extending from the hilum inferiorly, measuring 4.8 cm in maximal dimension for example image 21, unchanged when allowing for differences in technique and inspiration. No discrete mass is otherwise measurable.  Heart size is mildly enlarged. Great vessels are normal in caliber. No lymphadenopathy. Evidence of thoracic vertebroplasty and compression fractures reidentified. Multiple remote rib fractures are again noted. The patient is markedly kyphotic. No lytic or sclerotic osseous lesion is identified. Remote sternal fracture reidentified.  IMPRESSION: Stable presumed post radiation partial right lower lobe collapse without separate identifiable mass lesion to correspond to the previously diagnosed lung cancer. No new acute intrathoracic abnormality or evidence for intrathoracic recurrence.   Electronically Signed   By: Conchita Paris M.D.   On: 12/18/2013 13:15    Impression:    The patient clinically is stable. I have personally reviewed the patient's CT scan of the chest. This shows stable apparent post radiation change within the right lower lobe. No new acute intrathoracic evidence of recurrence or metastatic disease.  Plan:  The patient will undergo a repeat CT scan of the chest in 4 months, then followup appointment.  I spent 15 minutes with the patient today, the majority of which was spent counseling the patient on the diagnosis of cancer and coordinating care.   Jodelle Gross, M.D., Ph.D.

## 2013-12-22 ENCOUNTER — Telehealth: Payer: Self-pay | Admitting: *Deleted

## 2013-12-22 NOTE — Telephone Encounter (Signed)
CALLED PATIENT TO INFORM OF TEST AND FU IN September 2015, SPOKE WITH PATIENT AND SHE IS AWARE OF THESE APPTS.

## 2014-01-09 ENCOUNTER — Ambulatory Visit (INDEPENDENT_AMBULATORY_CARE_PROVIDER_SITE_OTHER): Payer: Medicare Other | Admitting: General Practice

## 2014-01-09 DIAGNOSIS — Z5181 Encounter for therapeutic drug level monitoring: Secondary | ICD-10-CM

## 2014-01-09 DIAGNOSIS — Z7901 Long term (current) use of anticoagulants: Secondary | ICD-10-CM

## 2014-01-09 DIAGNOSIS — G459 Transient cerebral ischemic attack, unspecified: Secondary | ICD-10-CM

## 2014-01-09 DIAGNOSIS — I4891 Unspecified atrial fibrillation: Secondary | ICD-10-CM

## 2014-01-09 LAB — POCT INR: INR: 1.9

## 2014-01-09 NOTE — Progress Notes (Signed)
Pre visit review using our clinic review tool, if applicable. No additional management support is needed unless otherwise documented below in the visit note. 

## 2014-01-10 ENCOUNTER — Encounter: Payer: Self-pay | Admitting: Internal Medicine

## 2014-01-10 ENCOUNTER — Ambulatory Visit (INDEPENDENT_AMBULATORY_CARE_PROVIDER_SITE_OTHER): Payer: Medicare Other | Admitting: Internal Medicine

## 2014-01-10 VITALS — BP 159/83 | HR 61 | Ht 59.0 in | Wt 106.0 lb

## 2014-01-10 DIAGNOSIS — I4891 Unspecified atrial fibrillation: Secondary | ICD-10-CM

## 2014-01-10 DIAGNOSIS — I5032 Chronic diastolic (congestive) heart failure: Secondary | ICD-10-CM

## 2014-01-10 DIAGNOSIS — R0602 Shortness of breath: Secondary | ICD-10-CM

## 2014-01-10 MED ORDER — FUROSEMIDE 40 MG PO TABS: 80.0000 mg | ORAL_TABLET | Freq: Every day | ORAL | Status: DC

## 2014-01-10 NOTE — Progress Notes (Signed)
Patient Care Team: Rowe Clack, MD as PCP - General (Internal Medicine) Delrae Rend, MD as Consulting Physician (Endocrinology) Selinda Orion, MD as Consulting Physician (Obstetrics and Gynecology) Deboraha Sprang, MD as Consulting Physician (Cardiology) Earnstine Regal, MD as Consulting Physician (General Surgery) Mayme Genta, MD (Gastroenterology) Tanda Rockers, MD as Attending Physician (Pulmonary Disease) Marye Round, MD (Radiation Oncology)   HPI  Wendy Hopkins is a 80 y.o. female Seen in followup for atrial fibrillation with a CHADS score of 5-6 with a prior TIA/stroke  She was seen in December because of shortness of breath that was thought to be in part related to volume overload as well as bradycardia. Her beta blockers were discontinued and a diuretic initiated he'll also found to have poorly controlled ventricular response of her presumed to be permanent atrial fibrillation; Her atenolol was resumed   She continues to complain of shortness of breath. She has had a series of compression fractures and become markedly kyphotic in the last 6 months. She also has had further evaluation of prior lung tumor. Evaluation was thought that this is not recurrent.  CT scan from 12/18/13 demonstrated partial right lower lobe collapse   Past Medical History  Diagnosis Date  . Arthritis   . Hyperlipidemia   . 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 Closed fracture of unspecified part of femur 2008  . TIA (transient ischemic attack)   . Edema   . Chronic diastolic heart failure   . Osteoporosis with fracture     compression fx lower T spine and t4, L rib  . Thyroid nodule     dominate R nodule, s/p aspiration>abn path>6 mo obs planned ([facility] 06/01/11)  . GERD (gastroesophageal reflux disease)   . Hypertension 2006  . Atrial fibrillation     chronic anticoag; and AFlutter  . History of radiation therapy 11/30/2012-12/09/2012    52  gray to right chest  . Diabetes mellitus, type II, insulin dependent 08-15-12    over 40 years ago  . Vertebral compression fracture     Thoracic 12 and Thoracic 7  . Adenocarcinoma, lung dx 11/08/12    CT chest bx - XRT ongoing    Past Surgical History  Procedure Laterality Date  . Right femur  06/25/07    Intramedullary open reduction internal fixation  . Vitrectomy  2007    right eye with cataract repair - dr. Cordelia Pen  . Tonsillectomy  1947    Current Outpatient Prescriptions  Medication Sig Dispense Refill  . ACCU-CHEK AVIVA test strip 1 each 3 (three) times daily. Check twice daily      . acetaminophen (TYLENOL) 500 MG tablet Take 2 tablets (1,000 mg total) by mouth every 8 (eight) hours as needed for moderate pain.  30 tablet  0  . alendronate (FOSAMAX) 70 MG tablet Take 70 mg by mouth once a week. Take with a full glass of water on an empty stomach.      Marland Kitchen atenolol (TENORMIN) 25 MG tablet TAKE 1/2 TABLET BY MOUTH EVERY DAY  15 tablet  11  . atorvastatin (LIPITOR) 10 MG tablet Take 10 mg by mouth daily.      . Cholecalciferol (VITAMIN D) 2000 UNITS CAPS Take 2,000 Units by mouth daily.        . furosemide (LASIX) 40 MG tablet TAKE 1& 1/2 TABLET BY MOUTH DAILY  45 tablet  0  . insulin glargine (  LANTUS) 100 UNIT/ML injection Inject 8 Units into the skin daily.       . insulin lispro (HUMALOG) 100 UNIT/ML injection Inject 3 Units into the skin 3 (three) times daily before meals.      . Insulin Syringe-Needle U-100 (INSULIN SYRINGE 1CC/30GX5/16") 30G X 5/16" 1 ML MISC Use four times a day      . Lancets (FREESTYLE) lancets Use three times a day to check blood sugar      . warfarin (COUMADIN) 5 MG tablet Take as directed by anticoagulation clinic.  30 tablet  3   No current facility-administered medications for this visit.    Allergies  Allergen Reactions  . Codeine Nausea And Vomiting         Review of Systems negative except from HPI and PMH  Physical Exam BP 159/83  Pulse  61  Ht 4\' 11"  (1.499 m)  Wt 106 lb (48.081 kg)  BMI 21.40 kg/m2 Well developed and well nourished in no acute distress HENT normal E scleral and icterus clear Neck Supple Markedly decreased breath sounds on the right chest  Regular rate and rhythm, no murmurs gallops or rub Soft with active bowel sounds No clubbing cyanosis Trace Edema Alert and oriented, grossly normal motor and sensory function Skin Warm and Dry  ECG demonstrates atrial fibrillation at 61 intervals-/08/42  Assessment and  Plan  Atrial fibrillation-permanent  Dyspnea on exertion  Decreased breath sounds right chest  She has significant shortness of breath. 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 I suspect this is a large component of it. There is also a CT scan from 5/11 that demonstrated post radiation and partial right lower lobe collapse. This is also likely contributing. I don't know if this is gotten worse. We'll obtain a chest x-ray and I will as Dr. Melvyn Novas to review the above data and followup with the patient as to whether he thinks if anything that can be done from a pulmonary point of view. Given the cooccuring of peripheral edema that is very mild, we'll increase her diuretic from 60-80 mg daily.

## 2014-01-10 NOTE — Patient Instructions (Signed)
Your physician has recommended you make the following change in your medication:  1) INCREASE Lasix to 80 mg daily  A chest x-ray takes a picture of the organs and structures inside the chest, including the heart, lungs, and blood vessels. This test can show several things, including, whether the heart is enlarges; whether fluid is building up in the lungs; and whether pacemaker / defibrillator leads are still in place.  Your physician wants you to follow-up in: 6 months with Dr. Caryl Comes. You will receive a reminder letter in the mail two months in advance. If you don't receive a letter, please call our office to schedule the follow-up appointment.

## 2014-01-11 ENCOUNTER — Ambulatory Visit (INDEPENDENT_AMBULATORY_CARE_PROVIDER_SITE_OTHER)
Admission: RE | Admit: 2014-01-11 | Discharge: 2014-01-11 | Disposition: A | Discharge status: Home / Self Care | Payer: Medicare Other | Source: Ambulatory Visit | Attending: Internal Medicine | Admitting: Internal Medicine

## 2014-01-11 DIAGNOSIS — R0602 Shortness of breath: Secondary | ICD-10-CM

## 2014-01-15 ENCOUNTER — Telehealth: Payer: Self-pay | Admitting: *Deleted

## 2014-01-15 NOTE — Telephone Encounter (Signed)
Spoke with the pt and advised we need to get her in for rov  She verbalized understanding  Will have daughter call to schedule since this is her transportation

## 2014-01-15 NOTE — Telephone Encounter (Signed)
Message copied by Rosana Berger on Mon Jan 15, 2014 10:34 AM ------      Message from: Tanda Rockers      Created: Sat Jan 13, 2014  7:37 AM       Magda Paganini:  See if we have her on the schedule to see in Pulmonary clinic, if not be sure we do.                  ----- Message -----         From: Rowe Clack, MD         Sent: 01/12/2014   7:57 AM           To: Tanda Rockers, MD            I think Richardson Landry meant to send this to you and PCP came up by default -       i'll let you take this one as intended for you      Thanks Ronalee Belts,      VAL            ----- Message -----         From: Deboraha Sprang, MD         Sent: 01/10/2014   6:16 PM           To: Rowe Clack, MD            Ronalee Belts would you look at her CT scan from 5/15 and a chest x-ray that we ordered today to see whether there is anything he is think can be done from a pulmonary point of view with her right lower lobe that might help with her dyspnea which is very limiting . She also has a significant component of mechanical issues related to profound kyphosis which has been relatively acute.  You can me to let me know, Ronalee Belts, or the patient directly.  Thanks steve             ------

## 2014-01-17 ENCOUNTER — Other Ambulatory Visit: Payer: Self-pay | Admitting: Internal Medicine

## 2014-01-18 ENCOUNTER — Encounter: Payer: Self-pay | Admitting: Internal Medicine

## 2014-01-18 ENCOUNTER — Ambulatory Visit (INDEPENDENT_AMBULATORY_CARE_PROVIDER_SITE_OTHER): Payer: Medicare Other | Admitting: Internal Medicine

## 2014-01-18 VITALS — BP 108/60 | HR 62 | Temp 97.8°F | Ht 59.0 in | Wt 107.0 lb

## 2014-01-18 DIAGNOSIS — R0600 Dyspnea, unspecified: Secondary | ICD-10-CM

## 2014-01-18 NOTE — Patient Instructions (Addendum)
Stop fosfamax for now   Start pepcid ac 20 mg one after breakfast and one at bedtime   GERD (REFLUX)  is an extremely common cause of respiratory symptoms, many times with no significant heartburn at all.    It can be treated with medication, but also with lifestyle changes including avoidance of late meals, excessive alcohol, smoking cessation, and avoid fatty foods, chocolate, peppermint, colas, red wine, and acidic juices such as orange juice.  NO MINT OR MENTHOL PRODUCTS SO NO COUGH DROPS  USE SUGARLESS CANDY INSTEAD (jolley ranchers or Stover's)  NO OIL BASED VITAMINS - use powdered substitutes.    Please schedule a follow up office visit in 6 weeks, call sooner if needed

## 2014-01-18 NOTE — Progress Notes (Signed)
  Subjective:    Patient ID: Wendy Hopkins, female    DOB: Jul 26, 1926 MRN: [id number]    Brief patient profile:  56 yowf never smoker only passive exposure referred 09/28/2012 by Dr Asa Lente for R pulmonary nodule found on w/u of back pain   History of Present Illness  09/28/2012 Wendy Hopkins/ 1st pulmonary eval cc [facility]e [facility] steps [facility] [employer] x years also with  Walking  "any distance in a hurry". No h/o hemoptysis.  Back pain better p T12 vertebroplasty 08/15/12 rec PET > Bx 11/08/12 Adenoca > completed RT Dec 09 2012     01/18/2014 f/u ov/Wendy Hopkins re:  Chief Complaint  Patient presents with  . Acute Visit    Pt states that her breathing has been worsening since March 2015. She is SOB just wlaking from room to room and woke up last night feeling SOB.  She has occ non prod cough.   indolent onset progressive doe then sob at rest assoc with dry dry/ ? Since started back on fosfamax    No obvious day to day or daytime variabilty or assoc cp or chest tightness, subjective wheeze overt sinus or hb symptoms. No unusual exp hx or h/o childhood pna/ asthma or knowledge of premature birth.   . Also denies any obvious fluctuation of symptoms with weather or environmental changes or other aggravating or alleviating factors except as outlined above   Current Medications, Allergies, Complete Past Medical History, Past Surgical History, Family History, and Social History were reviewed in [employer] record.  ROS  The following are not active complaints unless bolded sore throat, dysphagia, dental problems, itching, sneezing,  nasal congestion or excess/ purulent secretions, ear ache,   fever, chills, sweats, unintended wt loss, pleuritic or exertional cp, hemoptysis,  orthopnea pnd or leg swelling, presyncope, palpitations, heartburn, abdominal pain, anorexia, nausea, vomiting, diarrhea  or change in bowel or urinary habits, change in stools or urine, dysuria,hematuria,  rash, arthralgias, visual  complaints, headache, numbness weakness or ataxia or problems with walking or coordination,  change in mood/affect or memory.             Objective:   Physical Exam   01/18/2014        107  Wt Readings from Last 3 Encounters:  09/28/12 122 lb 3.2 oz (55.43 kg)  06/20/12 133 lb 3.2 oz (60.419 kg)  02/26/12 125 lb (56.7 kg)     HEENT: nl dentition, turbinates, and orophanx. Nl external ear canals without cough reflex   NECK :  without JVD/Nodes/TM/ nl carotid upstrokes bilaterally   LUNGS: no acc muscle use, clear to A and P bilaterally without cough on insp or exp maneuvers   CV:  RRR  no s3 or murmur or increase in P2, no edema   ABD:  soft and nontender with nl excursion in the supine position. No bruits or organomegaly, bowel sounds nl  MS:  warm  withou calf tenderness, cyanosis or clubbing. Moderate Thoracic kyphosis  SKIN: warm and dry without lesions    NEURO:  alert, approp, no deficits      cxr  01/11/14 Stable volume loss at the right lung base with probable partial  collapse of the right lower lobe as noted on prior CT. Cardiomegaly.   01/18/14 labs ordered, not done     Assessment & Plan:

## 2014-01-21 NOTE — Assessment & Plan Note (Addendum)
01/18/2014   Walked RA x one lap @ 185 stopped due to  Fatigue > sob, no desat  Symptoms are markedly disproportionate to objective findings and not clear this is a lung problem but pt does appear to have difficult airway management issues.   Adherence is always the initial "prime suspect" and is a multilayered concern that requires a "trust but verify" approach in every patient - starting with knowing how to use medications, especially inhalers, correctly, keeping up with refills and understanding the fundamental difference between maintenance and prns vs those medications only taken for a very short course and then stopped and not refilled.   ? Acid (or non-acid) GERD > always difficult to exclude as up to 75% of pts in some series report no assoc GI/ Heartburn symptoms> rec max (24h)  acid suppression and stop fosfamax for now >> diet restrictions/ reviewed and instructions given in writing.   ? chf > check bnp (requested but not done)   See instructions for specific recommendations which were reviewed directly with the patient who was given a copy with highlighter outlining the key components.

## 2014-01-22 ENCOUNTER — Telehealth: Payer: Self-pay | Admitting: *Deleted

## 2014-01-22 LAB — HM DIABETES EYE EXAM

## 2014-01-22 NOTE — Telephone Encounter (Signed)
I spoke with the pt  She states that she did go for labs on 01/18/14  I spoke with Tonya in the lab and she states that the pt did not show up  She will have one of the other lab techs check and call me back

## 2014-01-22 NOTE — Telephone Encounter (Signed)
Spoke with the pt and advised that we have no record of her having labs  She states that she now remembers that she did not go to the lab, but will come in tomorrow for this  Nothing further needed

## 2014-01-22 NOTE — Telephone Encounter (Signed)
Message copied by Rosana Berger on Mon Jan 22, 2014 10:50 AM ------      Message from: Tanda Rockers      Created: Sun Jan 21, 2014  1:15 PM       Did not go for labs as rec, see if she'll return  ------

## 2014-01-23 ENCOUNTER — Other Ambulatory Visit (INDEPENDENT_AMBULATORY_CARE_PROVIDER_SITE_OTHER): Payer: Medicare Other

## 2014-01-23 DIAGNOSIS — R0600 Dyspnea, unspecified: Secondary | ICD-10-CM

## 2014-01-23 DIAGNOSIS — R0989 Other specified symptoms and signs involving the circulatory and respiratory systems: Secondary | ICD-10-CM

## 2014-01-23 DIAGNOSIS — R0609 Other forms of dyspnea: Secondary | ICD-10-CM

## 2014-01-23 LAB — BASIC METABOLIC PANEL
BUN: 13 mg/dL (ref 6–23)
CHLORIDE: 95 meq/L — AB (ref 96–112)
CO2: 31 mEq/L (ref 19–32)
Calcium: 8.8 mg/dL (ref 8.4–10.5)
Creatinine, Ser: 1.2 mg/dL (ref 0.4–1.2)
GFR: 47.6 mL/min — ABNORMAL LOW (ref >60.00)
GLUCOSE: 222 mg/dL — AB (ref 70–99)
POTASSIUM: 3.8 meq/L (ref 3.5–5.1)
Sodium: 132 mEq/L — ABNORMAL LOW (ref 135–145)

## 2014-01-23 LAB — CBC WITH DIFFERENTIAL/PLATELET
Basophils Absolute: 0 10*3/uL (ref 0.0–0.1)
Basophils Relative: 0.3 % (ref 0.0–3.0)
Eosinophils Absolute: 0 10*3/uL (ref 0.0–0.7)
Eosinophils Relative: 0.4 % (ref 0.0–5.0)
HCT: 40.7 % (ref 36.0–46.0)
HEMOGLOBIN: 13.3 g/dL (ref 12.0–15.0)
Lymphocytes Relative: 12.1 % (ref 12.0–46.0)
Lymphs Abs: 0.7 10*3/uL (ref 0.7–4.0)
MCHC: 32.6 g/dL (ref 30.0–36.0)
MCV: 90.4 fl (ref 78.0–100.0)
MONO ABS: 0.5 10*3/uL (ref 0.1–1.0)
Monocytes Relative: 9.1 % (ref 3.0–12.0)
Neutro Abs: 4.7 10*3/uL (ref 1.4–7.7)
Neutrophils Relative %: 78.1 % — ABNORMAL HIGH (ref 43.0–77.0)
PLATELETS: 196 10*3/uL (ref 150.0–400.0)
RBC: 4.5 Mil/uL (ref 3.87–5.11)
RDW: 14.5 % (ref 11.5–15.5)
WBC: 6 10*3/uL (ref 4.0–10.5)

## 2014-01-23 LAB — HEMOGLOBIN A1C: HEMOGLOBIN A1C: 8.7 % — AB (ref 4.0–6.0)

## 2014-01-23 LAB — BRAIN NATRIURETIC PEPTIDE: PRO B NATRI PEPTIDE: 221 pg/mL — AB (ref 0.0–100.0)

## 2014-01-25 ENCOUNTER — Encounter: Payer: Self-pay | Admitting: Internal Medicine

## 2014-02-05 ENCOUNTER — Ambulatory Visit (INDEPENDENT_AMBULATORY_CARE_PROVIDER_SITE_OTHER): Payer: Medicare Other | Admitting: Internal Medicine

## 2014-02-05 ENCOUNTER — Telehealth: Payer: Self-pay | Admitting: Internal Medicine

## 2014-02-05 ENCOUNTER — Encounter: Payer: Self-pay | Admitting: Internal Medicine

## 2014-02-05 VITALS — BP 124/62 | HR 62 | Temp 98.0°F | Ht 59.0 in | Wt 110.0 lb

## 2014-02-05 DIAGNOSIS — I5032 Chronic diastolic (congestive) heart failure: Secondary | ICD-10-CM

## 2014-02-05 DIAGNOSIS — R0600 Dyspnea, unspecified: Secondary | ICD-10-CM

## 2014-02-05 DIAGNOSIS — R918 Other nonspecific abnormal finding of lung field: Secondary | ICD-10-CM

## 2014-02-05 MED ORDER — SPIRONOLACTONE 25 MG PO TABS: 25.0000 mg | ORAL_TABLET | Freq: Every day | ORAL | Status: DC

## 2014-02-05 MED ORDER — SPIRONOLACTONE 25 MG PO TABS: 25.0000 mg | ORAL_TABLET | Freq: Two times a day (BID) | ORAL | Status: DC

## 2014-02-05 NOTE — Telephone Encounter (Signed)
Add aldactone 25 mg twice daily with lasix  -----------------------------------------------------------------  Spoke with Ryan at [facility] to verify that the aldactone is to be taken 1 po bid, not qd like it was sent.  Sig has been changed in pt's chart.  Nothing further needed.

## 2014-02-05 NOTE — Progress Notes (Signed)
Subjective:    Patient ID: Wendy Hopkins, female    DOB: March 21, 1926 MRN: [id number]    Brief patient profile:  21 yowf never smoker only passive exposure referred 09/28/2012 by Dr Asa Lente for R pulmonary nodule found on w/u of back pain   History of Present Illness  09/28/2012 [name]/ 1st pulmonary eval cc [facility]e [facility] steps [facility] [employer] x years also with  Walking  "any distance in a hurry". No h/o hemoptysis.  Back pain better p T12 vertebroplasty 08/15/12 rec PET > Bx 11/08/12 Adenoca > completed RT Dec 09 2012     01/18/2014 f/u ov/[name] re: unexplained sob  Chief Complaint  Patient presents with  . Acute Visit    Pt states that her breathing has been worsening since March 2015. She is SOB just wlaking from room to room and woke up last night feeling SOB.  She has occ non prod cough.   indolent onset progressive doe then sob at rest assoc with dry dry/ ? Since started back on fosfamax  rec Stop fosfamax for now  Start pepcid ac 20 mg one after breakfast and one at bedtime  GERD diet   02/05/2014 f/u ov/[name] re: unexplained sob x March 2015  Chief Complaint  Patient presents with  . Acute Visit    Pt c/o increased DOE x 3 days.  She states that she gets SOB with very minimally exertion such as walking from lobby to exam room.         No obvious day to day or daytime variabilty or assoc cough  cp or chest tightness, subjective wheeze overt sinus or hb symptoms. No unusual exp hx or h/o childhood pna/ asthma or knowledge of premature birth.   . Also denies any obvious fluctuation of symptoms with weather or environmental changes or other aggravating or alleviating factors except as outlined above   Current Medications, Allergies, Complete Past Medical History, Past Surgical History, Family History, and Social History were reviewed in [employer] record.  ROS  The following are not active complaints unless bolded sore throat, dysphagia, dental problems, itching,  sneezing,  nasal congestion or excess/ purulent secretions, ear ache,   fever, chills, sweats, unintended wt loss, pleuritic or exertional cp, hemoptysis,  Orthopnea maybe once a week pnd or leg swelling, presyncope, palpitations, heartburn, abdominal pain, anorexia, nausea, vomiting, diarrhea  or change in bowel or urinary habits, change in stools or urine, dysuria,hematuria,  rash, arthralgias, visual complaints, headache, numbness weakness or ataxia or problems with walking or coordination,  change in mood/affect or memory.             Objective:   Physical Exam   01/18/2014        107 > 02/05/2014   110 Wt Readings from Last 3 Encounters:  09/28/12 122 lb 3.2 oz (55.43 kg)  06/20/12 133 lb 3.2 oz (60.419 kg)  02/26/12 125 lb (56.7 kg)     HEENT: nl dentition, turbinates, and orophanx. Nl external ear canals without cough reflex   NECK :  without JVD/Nodes/TM/ nl carotid upstrokes bilaterally   LUNGS: no acc muscle use, clear to A and P bilaterally without cough on insp or exp maneuvers   CV:  RRR  no s3 or murmur or increase in P2, no edema   ABD:  soft and nontender with nl excursion in the supine position. No bruits or organomegaly, bowel sounds nl  MS:  warm  withou calf tenderness, cyanosis or clubbing. Moderate Thoracic  kyphosis  SKIN: warm and dry without lesions          cxr  01/11/14 Stable volume loss at the right lung base with probable partial  collapse of the right lower lobe as noted on prior CT. Cardiomegaly.  Lab Results  Component Value Date   PROBNP 221.0* 01/23/2014        Assessment & Plan:

## 2014-02-05 NOTE — Telephone Encounter (Signed)
Called, spoke with pt's daughter, Lenna Sciara - Reports pt c/o increased DOE since the end of last week.  Reports this has worsened since last OV with Dr. Melvyn Novas.  Denies chest tightness/pain or cough.  Doesn't have rescue inhaler.  Requesting OV - this has been scheduled for today at 11 am with Dr. Melvyn Novas.  [name] verbalized understanding and voiced no further questions or concerns at this time.

## 2014-02-05 NOTE — Assessment & Plan Note (Signed)
CT 08/22/12: RLL mass 3.6cm x2.3cm - Spirometry 09/29/12 FEV1  1.08 (77%) with ratio 67 / poor effort on f/v loop -  PET 10/18/2012  C/w IB dz > PET > Bx 11/08/12 Adenoca > completed RT Dec 09 2012   She has sign scar in R Base but no evidence of recurrence or reversible process

## 2014-02-05 NOTE — Patient Instructions (Addendum)
Add aldactone 25 mg twice daily with lasix   Please schedule a follow up office visit in 4 weeks, sooner if needed with Tammy NP to check your progress on this new medication and recheck your kidney function

## 2014-02-05 NOTE — Assessment & Plan Note (Signed)
-   01/18/2014   Walked RA x one lap @ 185 stopped due to  Fatigue > sob, no desat - 02/05/2014   Walked RA x one lap @ 185 stopped due to tired, legs  Gave out, unsteady on feet but no desats   Does not really appear to be limited by breathing but more likely by deconditioning and ? Diastolic chf.  There is clearly restrictive change by kyphosis and prev RT but nothing reversible here

## 2014-02-05 NOTE — Assessment & Plan Note (Signed)
She has sign pitting > try adding aldactone 25 mg bid and recheck bp/ lytes in 2 weeks

## 2014-02-06 ENCOUNTER — Telehealth: Payer: Self-pay | Admitting: Internal Medicine

## 2014-02-06 ENCOUNTER — Ambulatory Visit (INDEPENDENT_AMBULATORY_CARE_PROVIDER_SITE_OTHER): Payer: Medicare Other | Admitting: General Practice

## 2014-02-06 DIAGNOSIS — Z5181 Encounter for therapeutic drug level monitoring: Secondary | ICD-10-CM

## 2014-02-06 DIAGNOSIS — I4891 Unspecified atrial fibrillation: Secondary | ICD-10-CM

## 2014-02-06 DIAGNOSIS — Z7901 Long term (current) use of anticoagulants: Secondary | ICD-10-CM

## 2014-02-06 DIAGNOSIS — G459 Transient cerebral ischemic attack, unspecified: Secondary | ICD-10-CM

## 2014-02-06 LAB — POCT INR: INR: 1.8

## 2014-02-06 NOTE — Telephone Encounter (Signed)
I called made Wendy Hopkins aware of recs. Nothing further needed.

## 2014-02-06 NOTE — Progress Notes (Signed)
Pre visit review using our clinic review tool, if applicable. No additional management support is needed unless otherwise documented below in the visit note. 

## 2014-02-06 NOTE — Telephone Encounter (Signed)
I called spoke with [name]. She reports pt taking spironolactone 25 mg BID. Pt is constantly going to the bathroom. Her swelling has came down some today. [name] wants to know if we can cut back on the spironolactone to possibly take QD instead. Please advise Dr. Melvyn Novas thanks

## 2014-02-06 NOTE — Telephone Encounter (Signed)
Ok to reduce to once daily

## 2014-02-19 ENCOUNTER — Encounter: Payer: Self-pay | Admitting: Adult Health

## 2014-02-19 ENCOUNTER — Ambulatory Visit (INDEPENDENT_AMBULATORY_CARE_PROVIDER_SITE_OTHER): Payer: Medicare Other | Admitting: Adult Health

## 2014-02-19 ENCOUNTER — Other Ambulatory Visit (INDEPENDENT_AMBULATORY_CARE_PROVIDER_SITE_OTHER): Payer: Medicare Other

## 2014-02-19 ENCOUNTER — Encounter (INDEPENDENT_AMBULATORY_CARE_PROVIDER_SITE_OTHER): Payer: Self-pay

## 2014-02-19 VITALS — BP 130/60 | HR 60 | Temp 97.4°F | Ht 59.0 in | Wt 108.0 lb

## 2014-02-19 DIAGNOSIS — R0609 Other forms of dyspnea: Secondary | ICD-10-CM

## 2014-02-19 DIAGNOSIS — R0989 Other specified symptoms and signs involving the circulatory and respiratory systems: Secondary | ICD-10-CM

## 2014-02-19 DIAGNOSIS — I5032 Chronic diastolic (congestive) heart failure: Secondary | ICD-10-CM

## 2014-02-19 DIAGNOSIS — R0600 Dyspnea, unspecified: Secondary | ICD-10-CM

## 2014-02-19 LAB — BASIC METABOLIC PANEL
BUN: 17 mg/dL (ref 6–23)
CO2: 31 mEq/L (ref 19–32)
Calcium: 8.6 mg/dL (ref 8.4–10.5)
Chloride: 94 mEq/L — ABNORMAL LOW (ref 96–112)
Creatinine, Ser: 1.1 mg/dL (ref 0.4–1.2)
GFR: 48.08 mL/min — AB (ref >60.00)
GLUCOSE: 217 mg/dL — AB (ref 70–99)
POTASSIUM: 4.3 meq/L (ref 3.5–5.1)
Sodium: 131 mEq/L — ABNORMAL LOW (ref 135–145)

## 2014-02-19 NOTE — Assessment & Plan Note (Signed)
Suspect her DOE is multifactoral in nature w/ tendency toward fluid overload along w/ deconditioning w/ restrictive lung disease w/ severe kyphosis and post radiation changes in lung from XRT/lung cancer  >no ambulatory desats , CT chest w/ stable changes-no sign of tumor reocurrence   Plan  Cont on aldactone 25mg  daily  Check bmet today

## 2014-02-19 NOTE — Progress Notes (Signed)
Subjective:    Patient ID: Wendy Hopkins, female    DOB: 03-17-1926 MRN: [id number]    Brief patient profile:  25 yowf never smoker only passive exposure referred 09/28/2012 by Dr Wendy Hopkins for R pulmonary nodule found on w/u of back pain   History of Present Illness  09/28/2012 Wendy Hopkins/ 1st pulmonary eval cc [facility]e x steps [facility] [employer] x years also with  Walking  "any distance in a hurry". No h/o hemoptysis.  Back pain better p T12 vertebroplasty 08/15/12 rec PET > Bx 11/08/12 Adenoca > completed RT Dec 09 2012     01/18/2014 f/u ov/Wendy Hopkins re: unexplained sob  Chief Complaint  Patient presents with  . Acute Visit    Pt states that her breathing has been worsening since March 2015. She is SOB just wlaking from room to room and woke up last night feeling SOB.  She has occ non prod cough.   indolent onset progressive doe then sob at rest assoc with dry dry/ ? Since started back on fosfamax  rec Stop fosfamax for now  Start pepcid ac 20 mg one after breakfast and one at bedtime  GERD diet   02/05/2014 f/u ov/Wendy Hopkins re: unexplained sob x March 2015  Chief Complaint  Patient presents with  . Acute Visit    Pt c/o increased DOE x 3 days.  She states that she gets SOB with very minimally exertion such as walking from lobby to exam room.    >>aldactone 25mg  Twice daily    02/19/2014 Follow up    Returns for 2 weeks follow up for DOE. Says over last several months to year more DOE , wears out with minimal activity.  No ambulatory desats.  BNP mildly elevated ~200, echo showed EF 55-60%, PAP 47, mod LA/RA dilation  LE edema on lasix. Last ov , aldactone added.  Had to decrease aldactone 25mg  daily -due to much urination. No sign change in edema or dyspnea/DOE  Has severe kyphosis w/ restriction on PFT  Family says she is sedentary , prev w/ compression fx w/ kyphoplasty.  Hx of  Stage IB Lung Adenoca Right > completed RT Dec 09 2012  Most recent CT chest 12/2013 w/ radiation changes of RLL w/ partial  collapse , no mass identified.    Current Medications, Allergies, Complete Past Medical History, Past Surgical History, Family History, and Social History were reviewed in [employer] record.  ROS  The following are not active complaints unless bolded sore throat, dysphagia, dental problems, itching, sneezing,  nasal congestion or excess/ purulent secretions, ear ache,   fever, chills, sweats, unintended wt loss, pleuritic or exertional cp, hemoptysis,   pnd or leg swelling, presyncope, palpitations, heartburn, abdominal pain, anorexia, nausea, vomiting, diarrhea  or change in bowel or urinary habits, change in stools or urine, dysuria,hematuria,  rash, arthralgias, visual complaints, headache, numbness weakness or ataxia or problems with walking or coordination,  change in mood/affect or memory.             Objective:   Physical Exam   01/18/2014        107 > 02/05/2014   110>108 02/19/2014   HEENT: nl dentition, turbinates, and orophanx. Nl external ear canals without cough reflex   NECK :  without JVD/Nodes/TM/ nl carotid upstrokes bilaterally   LUNGS: no acc muscle use, clear to A and P bilaterally without cough on insp or exp maneuvers Severe kyphosis    CV:  RRR  no s3 or murmur  or increase in P2, tr-1+ edema   ABD:  soft and nontender with nl excursion in the supine position. No bruits or organomegaly, bowel sounds nl  MS:  warm  withou calf tenderness, cyanosis or clubbing. Moderate -severe Thoracic kyphosis  SKIN: warm and dry without lesions          cxr  01/11/14 Stable volume loss at the right lung base with probable partial  collapse of the right lower lobe as noted on prior CT. Cardiomegaly.  Lab Results  Component Value Date   PROBNP 221.0* 01/23/2014        Assessment & Plan:

## 2014-02-19 NOTE — Patient Instructions (Signed)
Continue on aldactone 25mg  daily .  Low salt diet  Legs elevated.  Activity as tolerated.  Labs today .  follow up Dr. Melvyn Novas  In 4-6 weeks and As needed

## 2014-02-21 ENCOUNTER — Ambulatory Visit (INDEPENDENT_AMBULATORY_CARE_PROVIDER_SITE_OTHER): Payer: Medicare Other | Admitting: Internal Medicine

## 2014-02-21 ENCOUNTER — Encounter: Payer: Self-pay | Admitting: Internal Medicine

## 2014-02-21 VITALS — BP 100/58 | HR 59 | Temp 97.9°F | Ht 59.0 in | Wt 106.1 lb

## 2014-02-21 DIAGNOSIS — R0609 Other forms of dyspnea: Secondary | ICD-10-CM

## 2014-02-21 DIAGNOSIS — R0989 Other specified symptoms and signs involving the circulatory and respiratory systems: Secondary | ICD-10-CM

## 2014-02-21 DIAGNOSIS — M810 Age-related osteoporosis without current pathological fracture: Secondary | ICD-10-CM

## 2014-02-21 DIAGNOSIS — E119 Type 2 diabetes mellitus without complications: Secondary | ICD-10-CM

## 2014-02-21 DIAGNOSIS — R0600 Dyspnea, unspecified: Secondary | ICD-10-CM

## 2014-02-21 DIAGNOSIS — Z794 Long term (current) use of insulin: Secondary | ICD-10-CM

## 2014-02-21 MED ORDER — GABAPENTIN 100 MG PO CAPS: 100.0000 mg | ORAL_CAPSULE | Freq: Every day | ORAL | Status: DC

## 2014-02-21 NOTE — Patient Instructions (Signed)
It was good to see you today.  We have reviewed your prior records including labs and tests today  Medications reviewed and updated  Start low-dose gabapentin 100 mg capsule at bedtime to help with pain and breathing - no other changes recommended at this time. Your prescription(s) have been submitted to your pharmacy. Please take as directed and contact our office if you believe you are having problem(s) with the medication(s).  We will work to authorize you for prolia injections to treat your osteoporosis. Once we have this medication approved, call office will call to arrange for nurse visit injection every 6 months  Continue working with your other specialists as ongoing  Please schedule followup in 6 months, call sooner if problems.

## 2014-02-21 NOTE — Progress Notes (Signed)
Pre visit review using our clinic review tool, if applicable. No additional management support is needed unless otherwise documented below in the visit note. 

## 2014-02-21 NOTE — Assessment & Plan Note (Signed)
Follows with endo (kerr) for same - on Lantus and SSI uncontrolled by hx - but reports improving Lab Results  Component Value Date   HGBA1C 8.7* 01/23/2014

## 2014-02-21 NOTE — Assessment & Plan Note (Signed)
Vertebral compression fx 07/2012 s/p KP Recurrent fx s/p KP 06/2013 for T7 Incidental L rib fx 06/2013 Given persisitng pain despite fx healing (on follow up films), will start gabapentin Remotely on fosamax - then prescribed Actonel early 2014, but stopped late summer 2014 due to cost prohibitive will investigate prolia tx

## 2014-02-21 NOTE — Assessment & Plan Note (Signed)
Agree with Tammy: multifactorial symptoms Diastolic HF/vol overload, deconditioning, restrictive lung dz from severe kyphosis and prior XRT No acute issues today Will help tx pain and anxiety to help manage same

## 2014-02-21 NOTE — Progress Notes (Signed)
Subjective:    Patient ID: Wendy Hopkins, female    DOB: [birth year], 80 y.o.   MRN: [id number]  HPI  Patient is here for follow up  Reviewed chronic medical issues and interval medical events  Past Medical History  Diagnosis Date  . Arthritis   . Hyperlipidemia   . 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 Closed fracture of unspecified part of femur 2008  . TIA (transient ischemic attack)   . Edema   . Chronic diastolic heart failure   . Osteoporosis with fracture     compression fx lower T spine and t4, L rib  . Thyroid nodule     dominate R nodule, s/p aspiration>abn path>6 mo obs planned ([facility] 06/01/11)  . GERD (gastroesophageal reflux disease)   . Hypertension 2006  . Atrial fibrillation     chronic anticoag; and AFlutter  . History of radiation therapy 11/30/2012-12/09/2012    50 gray to right chest  . Diabetes mellitus, type II, insulin dependent 08-15-12    over 40 years ago  . Vertebral compression fracture     Thoracic 12 and Thoracic 7  . Adenocarcinoma, lung dx 11/08/12    CT chest bx - XRT ongoing    Review of Systems  Constitutional: Positive for fatigue. Negative for unexpected weight change.  Respiratory: Positive for shortness of breath (at rest and with exertion - not positional - pulm and cards recent eval for same).   Cardiovascular: Negative for chest pain and leg swelling.  Musculoskeletal: Negative for back pain and neck stiffness.  Psychiatric/Behavioral: Positive for sleep disturbance.       Objective:   Physical Exam  BP 100/58  Pulse 59  Temp(Src) 97.9 F (36.6 C) (Oral)  Ht 4\' 11"  (1.499 m)  Wt 106 lb 1.9 oz (48.136 kg)  BMI 21.42 kg/m2  SpO2 97% Wt Readings from Last 3 Encounters:  02/21/14 106 lb 1.9 oz (48.136 kg)  02/19/14 108 lb (48.988 kg)  02/05/14 110 lb (49.896 kg)   Constitutional: She appears well-developed and well-nourished. No distress. Spouse and dtr Wendy Hopkins at side Neck: Normal range of  motion. Neck supple. No JVD present. No thyromegaly present.  Cardiovascular: Normal rate, irregular rhythm and normal heart sounds.  No murmur heard. No BLE edema. Pulmonary/Chest: Effort normal and breath sounds diminished at bases. No respiratory distress. She has no wheezes.  MSkel: significant kyphosis curvature Psychiatric: She has a normal mood and affect. Her behavior is normal. Judgment and thought content normal.   Lab Results  Component Value Date   WBC 6.0 01/23/2014   HGB 13.3 01/23/2014   HCT 40.7 01/23/2014   PLT 196.0 01/23/2014   GLUCOSE 217* 02/19/2014   CHOL 105 06/22/2013   TRIG 63.0 06/22/2013   HDL 51.40 06/22/2013   LDLCALC 41 06/22/2013   ALT 12 11/08/2013   AST 18 11/08/2013   NA 131* 02/19/2014   K 4.3 02/19/2014   CL 94* 02/19/2014   CREATININE 1.1 02/19/2014   BUN 17 02/19/2014   CO2 31 02/19/2014   TSH 2.55 11/08/2013   INR 1.8 02/06/2014   HGBA1C 8.0* 11/08/2013    Dg Chest 2 View  01/11/2014   CLINICAL DATA:  Shortness of breath for 3 months, diabetes, hypertension  EXAM: CHEST  2 VIEW  COMPARISON:  CT chest of 12/18/2013 and chest x-ray of 10/13/2013  FINDINGS: There is little change in under aeration of the lung bases with probable scarring at the  right lung base. Prior CT showed collapse of the portion in the right lower lobe which may account for the opacity posteriorly on the lateral view. Cardiomegaly is stable. The bones are diffusely osteopenic with thoracic kyphosis, and multilevel vertebroplasties are noted with multiple additional compression deformities noted as well. Old rib fractures are present bilaterally.  IMPRESSION: Stable volume loss at the right lung base with probable partial collapse of the right lower lobe as noted on prior CT. Cardiomegaly.   Electronically Signed   By: Wendy Hopkins M.D.   On: 01/11/2014 11:17       Assessment & Plan:   Problem List Items Addressed This Visit   Diabetes mellitus type 2, insulin dependent      Follows with endo  (kerr) for same - on Lantus and SSI uncontrolled by hx - but reports improving Lab Results  Component Value Date   HGBA1C 8.7* 01/23/2014      Dyspnea - Primary     Agree with Wendy Hopkins: multifactorial symptoms Diastolic HF/vol overload, deconditioning, restrictive lung dz from severe kyphosis and prior XRT No acute issues today Will help tx pain and anxiety to help manage same    Osteoporosis, unspecified     Vertebral compression fx 07/2012 s/p KP Recurrent fx s/p KP 06/2013 for T7 Incidental L rib fx 06/2013 Given persisitng pain despite fx healing (on follow up films), will start gabapentin Remotely on fosamax - then prescribed Actonel early 2014, but stopped late summer 2014 due to cost prohibitive will investigate prolia tx    Relevant Medications      PROLIA injection 60 mg/mL

## 2014-02-22 ENCOUNTER — Telehealth: Payer: Self-pay | Admitting: Internal Medicine

## 2014-02-22 NOTE — Telephone Encounter (Signed)
Sent pt's info to [employer] for insurance verification. Will notify you as soon as I have a response. Thank you.

## 2014-03-01 ENCOUNTER — Ambulatory Visit: Payer: Medicare Other | Admitting: Internal Medicine

## 2014-03-06 ENCOUNTER — Ambulatory Visit (INDEPENDENT_AMBULATORY_CARE_PROVIDER_SITE_OTHER): Payer: Medicare Other | Admitting: General Practice

## 2014-03-06 DIAGNOSIS — Z7901 Long term (current) use of anticoagulants: Secondary | ICD-10-CM

## 2014-03-06 DIAGNOSIS — Z5181 Encounter for therapeutic drug level monitoring: Secondary | ICD-10-CM

## 2014-03-06 DIAGNOSIS — I4891 Unspecified atrial fibrillation: Secondary | ICD-10-CM

## 2014-03-06 DIAGNOSIS — G459 Transient cerebral ischemic attack, unspecified: Secondary | ICD-10-CM

## 2014-03-06 LAB — POCT INR: INR: 2.4

## 2014-03-06 NOTE — Progress Notes (Signed)
Pre visit review using our clinic review tool, if applicable. No additional management support is needed unless otherwise documented below in the visit note. 

## 2014-03-08 ENCOUNTER — Other Ambulatory Visit: Payer: Self-pay | Admitting: General Practice

## 2014-03-08 ENCOUNTER — Other Ambulatory Visit: Payer: Self-pay | Admitting: Internal Medicine

## 2014-03-08 MED ORDER — WARFARIN SODIUM 5 MG PO TABS: ORAL_TABLET | ORAL | Status: DC

## 2014-03-13 NOTE — Telephone Encounter (Signed)
Pt daughter was informed of the estimated cost. Nurse Visit has been scheduled for 04/09/14 @ 9am.

## 2014-03-13 NOTE — Telephone Encounter (Signed)
I have rec'd pt's insurance verification for her Prolia injection.  Wendy Hopkins estimated responsibility w/an office visit will be $10; w/out an office visit her estimated responsibility will be $0.  Please make the patient aware this is an estimate.  If you have further questions, please let me know. Thank you.

## 2014-03-19 ENCOUNTER — Ambulatory Visit (INDEPENDENT_AMBULATORY_CARE_PROVIDER_SITE_OTHER): Payer: Medicare Other | Admitting: Internal Medicine

## 2014-03-19 ENCOUNTER — Ambulatory Visit (INDEPENDENT_AMBULATORY_CARE_PROVIDER_SITE_OTHER)
Admission: RE | Admit: 2014-03-19 | Discharge: 2014-03-19 | Disposition: A | Discharge status: Home / Self Care | Payer: Medicare Other | Source: Ambulatory Visit | Attending: Internal Medicine | Admitting: Internal Medicine

## 2014-03-19 ENCOUNTER — Encounter: Payer: Self-pay | Admitting: Internal Medicine

## 2014-03-19 VITALS — BP 118/62 | HR 68 | Temp 97.9°F | Ht 59.0 in | Wt 105.0 lb

## 2014-03-19 DIAGNOSIS — Z23 Encounter for immunization: Secondary | ICD-10-CM

## 2014-03-19 DIAGNOSIS — R0600 Dyspnea, unspecified: Secondary | ICD-10-CM

## 2014-03-19 DIAGNOSIS — R0989 Other specified symptoms and signs involving the circulatory and respiratory systems: Secondary | ICD-10-CM

## 2014-03-19 DIAGNOSIS — R0609 Other forms of dyspnea: Secondary | ICD-10-CM

## 2014-03-19 MED ORDER — SPIRONOLACTONE-HCTZ 25-25 MG PO TABS
1.0000 | ORAL_TABLET | Freq: Every day | ORAL | Status: DC
Start: 2014-03-19 — End: 2014-04-19

## 2014-03-19 NOTE — Progress Notes (Signed)
Subjective:    Patient ID: Wendy Hopkins, female    DOB: 15-Apr-1926 MRN: [id number]    Brief patient profile:  34 yowf never smoker only passive exposure referred 09/28/2012 by Dr Asa Lente for R pulmonary nodule found on w/u of back pain   History of Present Illness  09/28/2012 Wendy Hopkins/ 1st pulmonary eval [facility] doe [facility] [employer] x years also with  Walking  "any distance in a hurry". No h/o hemoptysis.  Back pain better p T12 vertebroplasty 08/15/12 rec PET > Bx 11/08/12 Adenoca > completed RT Dec 09 2012     01/18/2014 f/u ov/Wendy Hopkins re: unexplained sob  Chief Complaint  Patient presents with  . Acute Visit    Pt states that her breathing has been worsening since March 2015. She is SOB just wlaking from room to room and woke up last night feeling SOB.  She has occ non prod cough.   indolent onset progressive doe then sob at rest assoc with dry cogh/ ? Since started back on fosfamax  rec Stop fosfamax for now  Start pepcid ac 20 mg one after breakfast and one at bedtime  GERD diet   02/05/2014 f/u ov/Wendy Hopkins re: unexplained sob x March 2015  Chief Complaint  Patient presents with  . Acute Visit    Pt c/o increased DOE x 3 days.  She states that she gets SOB with very minimally exertion such as walking from lobby to exam room.    >>aldactone 25mg  Twice daily    02/19/2014 Follow up  Returns for 2 weeks follow up for DOE. Says over last several months to year more DOE , wears out with minimal activity.  No ambulatory desats.  BNP mildly elevated ~200, echo showed EF 55-60%, PAP 47, mod LA/RA dilation  LE edema on lasix. Last ov , aldactone added.  Had to decrease aldactone 25mg  daily -due to much urination. No sign change in edema or dyspnea/DOE  Has severe kyphosis w/ restriction on PFT  Family says she is sedentary , prev w/ compression fx w/ kyphoplasty.  Hx of  Stage IB Lung Adenoca Right > completed RT Dec 09 2012  Most recent CT chest 12/2013 w/ radiation changes of RLL w/ partial  collapse , no mass identified. rec Continue on aldactone 25mg  daily .  Low salt diet  Legs elevated.  Activity as tolerated.     03/19/2014 f/u ov/Wendy Hopkins re: severe restriction s/p RT/ kyphotic chest Chief Complaint  Patient presents with  . Follow-up    Breathing is unchanged since last visit. No new co's today.    ? What can you not do because your breathing hold you back A get in a hurry.  New abd pain, bloating p meals assoc with constipation, on miralax and fiber but has not increased the miralax yet. Leg swelling better on aldactone   No obvious day to day or daytime variabilty or assoc chronic cough or cp or chest tightness, subjective wheeze overt sinus   symptoms. No unusual exp hx or h/o childhood pna/ asthma or knowledge of premature birth.  Sleeping ok without nocturnal  or early am exacerbation  of respiratory  c/o's or need for noct saba. Also denies any obvious fluctuation of symptoms with weather or environmental changes or other aggravating or alleviating factors except as outlined above   Current Medications, Allergies, Complete Past Medical History, Past Surgical History, Family History, and Social History were reviewed in [employer] record.  ROS  The following  are not active complaints unless bolded sore throat, dysphagia, dental problems, itching, sneezing,  nasal congestion or excess/ purulent secretions, ear ache,   fever, chills, sweats, unintended wt loss, pleuritic or exertional cp, hemoptysis,  orthopnea pnd or leg swelling, presyncope, palpitations, , anorexia, nausea, vomiting, diarrhea  or change  urinary habits, change in stools or urine, dysuria,hematuria,  rash, arthralgias, visual complaints, headache, numbness weakness or ataxia or problems with walking or coordination,  change in mood/affect or memory.                      Objective:   Physical Exam   01/18/2014        107 > 02/05/2014   110>108 02/19/2014 > 03/19/2014 105    HEENT: nl dentition, turbinates, and orophanx. Nl external ear canals without cough reflex   NECK :  without JVD/Nodes/TM/ nl carotid upstrokes bilaterally   LUNGS: no acc muscle use, clear to A and P bilaterally without cough on insp or exp maneuvers Severethroracic kyphosis    CV:  RRR  no s3 or murmur or increase in P2, tr bilat sym ankle only pitting edema   ABD:  soft and nontender with nl excursion in the supine position. No bruits or organomegaly, bowel sounds nl  MS:  warm  withou calf tenderness, cyanosis or clubbing. Moderate -severe Thoracic kyphosis  SKIN: warm and dry without lesions           CXR  03/19/2014 : There is no evidence of pneumonia nor CHF. No suspicious parenchymal masses are demonstrated. There is chronic severe thoracic cage distortion due to multiple compression fractures of the thoracic spine.   Lab Results  Component Value Date   PROBNP 221.0* 01/23/2014    Lab Results  Component Value Date   WBC 6.0 01/23/2014   HGB 13.3 01/23/2014   HCT 40.7 01/23/2014   MCV 90.4 01/23/2014   PLT 196.0 01/23/2014      Chemistry      Component Value Date/Time   NA 131* 02/19/2014 1155   NA 130* 07/17/2013   K 4.3 02/19/2014 1155   CL 94* 02/19/2014 1155   CO2 31 02/19/2014 1155   BUN 17 02/19/2014 1155   BUN 16.6 12/18/2013 0940   BUN 15 07/17/2013   CREATININE 1.1 02/19/2014 1155   CREATININE 1.0 12/18/2013 0940   CREATININE 0.8 07/17/2013   GLU 187 10/17/2013      Component Value Date/Time   CALCIUM 8.6 02/19/2014 1155   ALKPHOS 105 11/08/2013 0945   AST 18 11/08/2013 0945   ALT 12 11/08/2013 0945   BILITOT 0.8 11/08/2013 0945         Assessment & Plan:

## 2014-03-19 NOTE — Patient Instructions (Addendum)
Please remember to go to the x-ray department downstairs for your tests - we will call you with the results when they are available.  Maalox plus as needed for abdominal discomfort and adjust your miralax until you are having regular soft bowel movements   Change aldactone to aldactazide 25/25  one daily   Please schedule a follow up visit in 3 months but call sooner if needed with cxr  Late add:  Recheck bmet p one week on aldactazide avoid drinking water and use hard rock candy if mouth dry

## 2014-03-20 NOTE — Assessment & Plan Note (Addendum)
11/16/14echo - Left ventricle: The cavity size was normal. Wall thickness was increased in a pattern of mild LVH. Systolic function was normal. The estimated ejection fraction was in the range of 55% to 60%. - Aortic valve: Trivial regurgitation. - Left atrium: The atrium was moderately dilated. - Right atrium: The atrium was moderately dilated. - Atrial septum: No defect or patent foramen ovale was identified. - Tricuspid valve: Moderate regurgitation. - Pulmonary arteries: PA peak pressure: 88mm Hg - 01/18/2014   Walked RA x one lap @ 185 stopped due to  Fatigue > sob, no desat - 02/05/2014   Walked RA x one lap @ 185 stopped due to tired, legs  Gave out, unsteady on feet but no desats   - 03/19/2014   Walked RA x 3 laps @ 185 ft each stopped p each lap due to fatigue but not sob/ no desat/ moderate pace  Clearly better with mild diuresis assoc with mild elevation of bnp typical of low chf ? Valvular by last echo  Will therefore change aldactone to aldactazide to eliminate the residual peripheral edema and improve calcium balance in setting of severe osteoporotic thoracic kyphosis noting the Na will need to be monitored and switch back to aldactone or use aldactone/lasix if Na drops significantly

## 2014-03-20 NOTE — Progress Notes (Signed)
Quick Note:  Spoke with pt and notified of results per Dr. [name]. Pt verbalized understanding and denied any questions.  ______ 

## 2014-03-21 ENCOUNTER — Telehealth: Payer: Self-pay | Admitting: *Deleted

## 2014-03-21 DIAGNOSIS — R0600 Dyspnea, unspecified: Secondary | ICD-10-CM

## 2014-03-21 NOTE — Telephone Encounter (Signed)
Message copied by Rosana Berger on Wed Mar 21, 2014 10:08 AM ------      Message from: Christinia Gully B      Created: Tue Mar 20, 2014  7:05 AM       echeck bmet p one week on aldactazide avoid drinking water and use hard rock candy if mouth dry  ------

## 2014-03-21 NOTE — Telephone Encounter (Signed)
Called x 4 and line was busy, [facility]

## 2014-03-22 NOTE — Telephone Encounter (Signed)
Spoke with the pt and notified of recs per MW  She verbalized understanding  Order for BMET was placed  I advised will call her with these results once received

## 2014-03-27 ENCOUNTER — Other Ambulatory Visit (INDEPENDENT_AMBULATORY_CARE_PROVIDER_SITE_OTHER): Payer: Medicare Other

## 2014-03-27 DIAGNOSIS — R0600 Dyspnea, unspecified: Secondary | ICD-10-CM

## 2014-03-27 DIAGNOSIS — R0989 Other specified symptoms and signs involving the circulatory and respiratory systems: Secondary | ICD-10-CM

## 2014-03-27 DIAGNOSIS — R0609 Other forms of dyspnea: Secondary | ICD-10-CM

## 2014-03-27 LAB — BASIC METABOLIC PANEL
BUN: 20 mg/dL (ref 6–23)
CO2: 33 mEq/L — ABNORMAL HIGH (ref 19–32)
CREATININE: 1.2 mg/dL (ref 0.4–1.2)
Calcium: 9 mg/dL (ref 8.4–10.5)
Chloride: 83 mEq/L — ABNORMAL LOW (ref 96–112)
GFR: 43.62 mL/min — AB (ref >60.00)
GLUCOSE: 294 mg/dL — AB (ref 70–99)
Potassium: 4.3 mEq/L (ref 3.5–5.1)
SODIUM: 125 meq/L — AB (ref 135–145)

## 2014-04-03 ENCOUNTER — Ambulatory Visit (INDEPENDENT_AMBULATORY_CARE_PROVIDER_SITE_OTHER): Payer: Medicare Other | Admitting: *Deleted

## 2014-04-03 DIAGNOSIS — G459 Transient cerebral ischemic attack, unspecified: Secondary | ICD-10-CM

## 2014-04-03 DIAGNOSIS — Z5181 Encounter for therapeutic drug level monitoring: Secondary | ICD-10-CM

## 2014-04-03 DIAGNOSIS — Z7901 Long term (current) use of anticoagulants: Secondary | ICD-10-CM

## 2014-04-03 DIAGNOSIS — I4891 Unspecified atrial fibrillation: Secondary | ICD-10-CM

## 2014-04-03 LAB — POCT INR: INR: 1.5

## 2014-04-04 ENCOUNTER — Telehealth: Payer: Self-pay | Admitting: Internal Medicine

## 2014-04-04 NOTE — Progress Notes (Signed)
Quick Note:  LMTCB ______ 

## 2014-04-04 NOTE — Telephone Encounter (Signed)
Called and spoke with pts daughter and she stated that someone had called her mother about her medication changes.  i advised the daughter that per the lab results---she will need to stop the new med and start back on the aldactone 50 mg daily x 2 weeks and see TP for follow up.  appt has been made for the pt.  Nothing further is needed.

## 2014-04-09 ENCOUNTER — Ambulatory Visit (INDEPENDENT_AMBULATORY_CARE_PROVIDER_SITE_OTHER): Payer: Medicare Other

## 2014-04-09 DIAGNOSIS — M810 Age-related osteoporosis without current pathological fracture: Secondary | ICD-10-CM

## 2014-04-09 MED ORDER — DENOSUMAB 60 MG/ML ~~LOC~~ SOLN
60.0000 mg | Freq: Once | SUBCUTANEOUS | Status: AC
  Administered 2014-04-09: 60 mg via SUBCUTANEOUS

## 2014-04-18 ENCOUNTER — Ambulatory Visit: Payer: Medicare Other | Admitting: Adult Health

## 2014-04-19 ENCOUNTER — Other Ambulatory Visit (INDEPENDENT_AMBULATORY_CARE_PROVIDER_SITE_OTHER): Payer: Medicare Other

## 2014-04-19 ENCOUNTER — Encounter: Payer: Self-pay | Admitting: Adult Health

## 2014-04-19 ENCOUNTER — Ambulatory Visit (INDEPENDENT_AMBULATORY_CARE_PROVIDER_SITE_OTHER): Payer: Medicare Other | Admitting: Adult Health

## 2014-04-19 VITALS — BP 114/74 | HR 63 | Temp 98.1°F | Ht 59.0 in | Wt 102.6 lb

## 2014-04-19 DIAGNOSIS — I5032 Chronic diastolic (congestive) heart failure: Secondary | ICD-10-CM

## 2014-04-19 DIAGNOSIS — Z23 Encounter for immunization: Secondary | ICD-10-CM

## 2014-04-19 DIAGNOSIS — R0609 Other forms of dyspnea: Secondary | ICD-10-CM

## 2014-04-19 DIAGNOSIS — R0600 Dyspnea, unspecified: Secondary | ICD-10-CM

## 2014-04-19 DIAGNOSIS — R0989 Other specified symptoms and signs involving the circulatory and respiratory systems: Secondary | ICD-10-CM

## 2014-04-19 LAB — BASIC METABOLIC PANEL
BUN: 14 mg/dL (ref 6–23)
CO2: 30 mEq/L (ref 19–32)
Calcium: 8.8 mg/dL (ref 8.4–10.5)
Chloride: 95 mEq/L — ABNORMAL LOW (ref 96–112)
Creatinine, Ser: 1.1 mg/dL (ref 0.4–1.2)
GFR: 51.7 mL/min — AB (ref >60.00)
Glucose, Bld: 266 mg/dL — ABNORMAL HIGH (ref 70–99)
POTASSIUM: 4.7 meq/L (ref 3.5–5.1)
Sodium: 131 mEq/L — ABNORMAL LOW (ref 135–145)

## 2014-04-19 LAB — BRAIN NATRIURETIC PEPTIDE: PRO B NATRI PEPTIDE: 236 pg/mL — AB (ref 0.0–100.0)

## 2014-04-19 NOTE — Progress Notes (Signed)
Subjective:    Patient ID: Wendy Hopkins, female    DOB: 08-Mar-1926 MRN: [id number]    Brief patient profile:  85 yowf never smoker only passive exposure referred 09/28/2012 by Dr Asa Lente for R pulmonary nodule found on w/u of back pain   History of Present Illness  09/28/2012 [name]/ 1st pulmonary eval cc doe x steps at [employer] x years also with  Walking  "any distance in a hurry". No h/o hemoptysis.  Back pain better p T12 vertebroplasty 08/15/12 rec PET > Bx 11/08/12 Adenoca > completed RT Dec 09 2012     01/18/2014 f/u ov/[name] re: unexplained sob  Chief Complaint  Patient presents with  . Acute Visit    Pt states that her breathing has been worsening since March 2015. She is SOB just wlaking from room to room and woke up last night feeling SOB.  She has occ non prod cough.   indolent onset progressive doe then sob at rest assoc with dry cogh/ ? Since started back on fosfamax  rec Stop fosfamax for now  Start pepcid ac 20 mg one after breakfast and one at bedtime  GERD diet   02/05/2014 f/u ov/[name] re: unexplained sob x March 2015  Chief Complaint  Patient presents with  . Acute Visit    Pt c/o increased DOE x 3 days.  She states that she gets SOB with very minimally exertion such as walking from lobby to exam room.    >>aldactone 25mg  Twice daily    02/19/2014 Follow up  Returns for 2 weeks follow up for DOE. Says over last several months to year more DOE , wears out with minimal activity.  No ambulatory desats.  BNP mildly elevated ~200, echo showed EF 55-60%, PAP 47, mod LA/RA dilation  LE edema on lasix. Last ov , aldactone added.  Had to decrease aldactone 25mg  daily -due to much urination. No sign change in edema or dyspnea/DOE  Has severe kyphosis w/ restriction on PFT  Family says she is sedentary , prev w/ compression fx w/ kyphoplasty.  Hx of  Stage IB Lung Adenoca Right > completed RT Dec 09 2012  Most recent CT chest 12/2013 w/ radiation changes of RLL w/ partial  collapse , no mass identified. rec Continue on aldactone 25mg  daily .  Low salt diet  Legs elevated.  Activity as tolerated.     03/19/2014 f/u ov/[name] re: severe restriction s/p RT/ kyphotic chest Chief Complaint  Patient presents with  . Follow-up    Breathing is unchanged since last visit. No new co's today.    ? What can you not do because your breathing hold you back A get in a hurry.  New abd pain, bloating p meals assoc with constipation, on miralax and fiber but has not increased the miralax yet. Leg swelling better on aldactone  >>changed to Aldactazide   04/19/2014 Follow up Severe restriction s/p RT/Kyphotic Chest  Pt returns for 1 month follow up . Last ov with increased dyspnea and LE edema last ov.  Changed to Aldactazide . Says ankle edema resolved.  CXR last ov w/ no acute process. BNP minimally elevated ~200.  Says overall her breathing is doing fail w/ no flare in cough or wheezing.  Wants flu shot today  No fever , chest pain, orthopnea, edema .     Current Medications, Allergies, Complete Past Medical History, Past Surgical History, Family History, and Social History were reviewed in [employer] record.  ROS  The following are not active complaints unless bolded sore throat, dysphagia, dental problems, itching, sneezing,  nasal congestion or excess/ purulent secretions, ear ache,   fever, chills, sweats, unintended wt loss, pleuritic or exertional cp, hemoptysis,  orthopnea pnd or leg swelling, presyncope, palpitations, , anorexia, nausea, vomiting, diarrhea  or change  urinary habits, change in stools or urine, dysuria,hematuria,  rash, arthralgias, visual complaints, headache, numbness weakness or ataxia or problems with walking or coordination,  change in mood/affect or memory.                      Objective:   Physical Exam   01/18/2014        107 > 02/05/2014   110>108 02/19/2014 > 03/19/2014 105 >>102 04/19/2014   HEENT: nl  dentition, turbinates, and orophanx. Nl external ear canals without cough reflex   NECK :  without JVD/Nodes/TM/ nl carotid upstrokes bilaterally   LUNGS: no acc muscle use, clear to A and P bilaterally without cough on insp or exp maneuvers Severethroracic kyphosis    CV:  RRR  no s3 or murmur or increase in P2, tr to none edema   ABD:  soft and nontender with nl excursion in the supine position. No bruits or organomegaly, bowel sounds nl  MS:  warm  withou calf tenderness, cyanosis or clubbing. Moderate -severe Thoracic kyphosis  SKIN: warm and dry without lesions       CXR  03/19/2014 : There is no evidence of pneumonia nor CHF. No suspicious parenchymal masses are demonstrated. There is chronic severe thoracic cage distortion due to multiple compression fractures of the thoracic spine.   Lab Results  Component Value Date   PROBNP 221.0* 01/23/2014    Lab Results  Component Value Date   WBC 6.0 01/23/2014   HGB 13.3 01/23/2014   HCT 40.7 01/23/2014   MCV 90.4 01/23/2014   PLT 196.0 01/23/2014      Chemistry      Component Value Date/Time   NA 131* 02/19/2014 1155   NA 130* 07/17/2013   K 4.3 02/19/2014 1155   CL 94* 02/19/2014 1155   CO2 31 02/19/2014 1155   BUN 17 02/19/2014 1155   BUN 16.6 12/18/2013 0940   BUN 15 07/17/2013   CREATININE 1.1 02/19/2014 1155   CREATININE 1.0 12/18/2013 0940   CREATININE 0.8 07/17/2013   GLU 187 10/17/2013      Component Value Date/Time   CALCIUM 8.6 02/19/2014 1155   ALKPHOS 105 11/08/2013 0945   AST 18 11/08/2013 0945   ALT 12 11/08/2013 0945   BILITOT 0.8 11/08/2013 0945         Assessment & Plan:

## 2014-04-19 NOTE — Assessment & Plan Note (Signed)
Mild flare , check labs w/ bmet /bnp  Cont on current regimen.

## 2014-04-19 NOTE — Patient Instructions (Signed)
Continue on current regimen .  Labs today  Flu shot today .  Follow up Dr. Melvyn Novas  In 2 months and As needed

## 2014-04-19 NOTE — Assessment & Plan Note (Signed)
multifactoral with restrictive lung dz w/ severe thoracic kyphosis , s/p XRT to lung for lung cancer and diastolic dysfunction   Improved on present regimen  Cont to follow  Plan  Continue on current regimen .  Labs today  Flu shot today .  Follow up Dr. Melvyn Novas  In 2 months and As needed

## 2014-04-20 NOTE — Progress Notes (Signed)
Quick Note:  LMOM TCB x1. ______ 

## 2014-04-23 ENCOUNTER — Ambulatory Visit (HOSPITAL_COMMUNITY): Payer: Medicare Other

## 2014-04-24 ENCOUNTER — Other Ambulatory Visit: Payer: Self-pay | Admitting: Internal Medicine

## 2014-04-24 ENCOUNTER — Other Ambulatory Visit: Payer: Self-pay | Admitting: Radiation Oncology

## 2014-04-24 ENCOUNTER — Ambulatory Visit (HOSPITAL_COMMUNITY)
Admission: RE | Admit: 2014-04-24 | Discharge: 2014-04-24 | Disposition: A | Discharge status: Home / Self Care | Payer: Medicare Other | Source: Ambulatory Visit | Attending: Radiation Oncology | Admitting: Radiation Oncology

## 2014-04-24 ENCOUNTER — Ambulatory Visit
Admission: RE | Admit: 2014-04-24 | Discharge: 2014-04-24 | Disposition: A | Discharge status: Home / Self Care | Payer: Medicare Other | Source: Ambulatory Visit | Attending: Radiation Oncology | Admitting: Radiation Oncology

## 2014-04-24 DIAGNOSIS — C3430 Malignant neoplasm of lower lobe, unspecified bronchus or lung: Secondary | ICD-10-CM | POA: Diagnosis present

## 2014-04-24 LAB — BUN AND CREATININE (CC13)
BUN: 13.8 mg/dL (ref 7.0–26.0)
Creatinine: 1.2 mg/dL — ABNORMAL HIGH (ref 0.6–1.1)

## 2014-04-24 MED ORDER — WARFARIN SODIUM 5 MG PO TABS: ORAL_TABLET | ORAL | Status: DC

## 2014-04-24 NOTE — Progress Notes (Signed)
Quick Note:  Patient's daughter Lenna Sciara returned call. Advised of lab results / recs as stated by TP. [name] verbalized understanding and denied any questions - Dr Buddy Duty is pt's endocrinologist per Lenna Sciara, will fax results to Dr Buddy Duty. ______

## 2014-04-26 ENCOUNTER — Encounter: Payer: Self-pay | Admitting: Radiation Oncology

## 2014-04-26 ENCOUNTER — Ambulatory Visit
Admission: RE | Admit: 2014-04-26 | Discharge: 2014-04-26 | Disposition: A | Discharge status: Home / Self Care | Payer: Medicare Other | Source: Ambulatory Visit | Attending: Radiation Oncology | Admitting: Radiation Oncology

## 2014-04-26 VITALS — BP 114/56 | HR 70 | Temp 98.2°F | Resp 20 | Ht 59.0 in | Wt 102.3 lb

## 2014-04-26 DIAGNOSIS — C3430 Malignant neoplasm of lower lobe, unspecified bronchus or lung: Secondary | ICD-10-CM

## 2014-04-26 NOTE — Progress Notes (Signed)
Follow up s/p rad txs RLL 11/30/12-12/09/12,  Appetite poor, dry cough, c/o slight nausea and stomach cramping, no bm 2-3 days, had flu  Injection 04/19/14 & and pneumonia injuection 03/19/14    Here to discuss CT Chest results done on 04/24/14, gets sob with mild exertion 100% room air sats, weak  Fatigued,  3:09 PM

## 2014-04-26 NOTE — Progress Notes (Signed)
Radiation Oncology         [phone] ________________________________  Name: Wendy Hopkins MRN: [id number]  Date: 04/26/2014  DOB: [birth year]  Follow-Up Visit Note  CC: Wendy Grant, MD  [name], MD  Diagnosis:   Non-small cell lung cancer involving the right lower lobe  Interval Since Last Radiation:  The patient completed radiation treatment on 12/09/2012   Narrative:  The patient returns today for routine follow-up.  The patient states she clinically has been fairly stable. She does experience some ongoing, chronic shortness of breath with exertion. No major changes in this. No fever. No cough.                              ALLERGIES:  is allergic to codeine.  Meds: Current Outpatient Prescriptions  Medication Sig Dispense Refill  . ACCU-CHEK AVIVA test strip 1 each 3 (three) times daily. Check twice daily      . atenolol (TENORMIN) 25 MG tablet TAKE 1/2 TABLET BY MOUTH EVERY DAY  15 tablet  11  . atorvastatin (LIPITOR) 10 MG tablet Take 10 mg by mouth daily.      . Cholecalciferol (VITAMIN D) 2000 UNITS CAPS Take 2,000 Units by mouth daily.        Marland Kitchen denosumab (PROLIA) 60 MG/ML SOLN injection Inject 60 mg into the skin every 6 (six) months. Administer in upper arm, thigh, or abdomen  1 mL  0  . furosemide (LASIX) 40 MG tablet Take 2 tablets (80 mg total) by mouth daily.  30 tablet  5  . insulin glargine (LANTUS) 100 UNIT/ML injection Inject 8 Units into the skin daily.       . insulin lispro (HUMALOG) 100 UNIT/ML injection Inject 3 Units into the skin 3 (three) times daily before meals.      . Insulin Syringe-Needle U-100 (INSULIN SYRINGE 1CC/30GX5/16") 30G X 5/16" 1 ML MISC Use four times a day      . Lancets (FREESTYLE) lancets Use three times a day to check blood sugar      . simethicone (MYLICON) 638 MG chewable tablet Chew 125 mg by mouth 3 (three) times daily after meals.      Marland Kitchen spironolactone (ALDACTONE) 25 MG tablet Take 2 tablets by mouth daily.        Marland Kitchen warfarin (COUMADIN) 5 MG tablet TAKE 1 TABLET BY MOUTH EVERY DAY EXECPT ON MONDAY AND THURSDAY TAKE 1/2 TABLET  40 tablet  3  . warfarin (COUMADIN) 5 MG tablet Take as directed by anticoagulation clinic  30 tablet  3  . acetaminophen (TYLENOL) 500 MG tablet Take 2 tablets (1,000 mg total) by mouth every 8 (eight) hours as needed for moderate pain.  30 tablet  0  . gabapentin (NEURONTIN) 100 MG capsule Take 1 capsule (100 mg total) by mouth at bedtime.  30 capsule  3   No current facility-administered medications for this encounter.    Physical Findings: The patient is in no acute distress. Patient is alert and oriented.  height is 4\' 11"  (1.499 m) and weight is 102 lb 4.8 oz (46.403 kg). Her oral temperature is 98.2 F (36.8 C). Her blood pressure is 114/56 and her pulse is 70. Her respiration is 20 and oxygen saturation is 100%. .   General: Well-developed, in no acute distress HEENT: Normocephalic, atraumatic Cardiovascular: Regular rate and rhythm Respiratory: Clear to auscultation bilaterally GI: Soft, nontender, normal bowel sounds Extremities: No  edema present   Lab Findings: Lab Results  Component Value Date   WBC 6.0 01/23/2014   HGB 13.3 01/23/2014   HCT 40.7 01/23/2014   MCV 90.4 01/23/2014   PLT 196.0 01/23/2014     Radiographic Findings: Ct Chest Wo Contrast  04/24/2014   CLINICAL DATA:  Lung cancer  EXAM: CT CHEST WITHOUT CONTRAST  TECHNIQUE: Multidetector CT imaging of the chest was performed following the standard protocol without IV contrast.  COMPARISON:  12/18/2012  FINDINGS: Mediastinum: The heart size is mildly enlarged. There is no pericardial effusion. Calcified atherosclerotic disease involves the thoracic aorta. Calcifications involving the LAD, left circumflex and RCA Coronary artery noted. The trachea appears patent. The esophagus is normal. No mediastinal adenopathy identified.  Lungs/Pleura: Resolution of previous right pleural effusion. Paramediastinal  radiation changes identified within the right lower lobe. Mass like consolidation within the right lower lobe measures 3.8 x 3.9 cm, image 37/series 8. This is improved from 4.5 x 4.8 cm previously. Nodule within the right lower lobe posteriorly measures 5 mm, image 26/ series 7. Not seen on previous exam. Also not seen on previous exam is a 5 mm nodule, image 28/series 7. No nodule or mass identified within the left lung.  Upper Abdomen: Incidental imaging through the upper abdomen shows no acute findings. The visualized portions of the adrenal glands are unremarkable. There is a cyst arising from the upper pole the right kidney which is incompletely characterized without IV contrast. This measures 1.9 cm.  Musculoskeletal: Review of the visualized osseous structures is significant for marked kyphosis and diffuse osteopenia. There are numerous compression deformities throughout the thoracic spine which appears similar to previous exam. No new fractures identified. Old sternal fracture noted. Chronic bilateral rib fracture deformities are also again noted.  IMPRESSION: 1. Right lower lobe masslike consolidation is decreased in size from previous exam. This may reflect changes secondary to external beam radiation. Underlying tumor would be difficult to exclude and continued followup advise. 2. A few scattered nodules are identified at the right base, not seen on previous exam. The appearance is nonspecific and may reflect changes secondary to inflammation. Attention on follow-up exam recommended. 3. No evidence for distant metastatic disease. 4. Marked osteopenia with numerous thoracic compression deformities, rib fractures and sternal fracture. The thoracic spine appears kyphotic. 5. Atherosclerotic disease including multi vessel coronary artery calcifications.   Electronically Signed   By: Kerby Moors M.D.   On: 04/24/2014 12:13    Impression:    The patient clinically is doing well. No new complaints. I have  personally reviewed the patient's CT scan and this did not show any clear evidence for progression/recurrence.  Plan:  We will repeat a CT scan of the chest in 4 months with followup subsequently.  I spent 10 minutes with the patient today, the majority of which was spent counseling the patient on the diagnosis of cancer and coordinating care.   Jodelle Gross, M.D., Ph.D.

## 2014-04-27 ENCOUNTER — Telehealth: Payer: Self-pay | Admitting: *Deleted

## 2014-04-27 NOTE — Telephone Encounter (Signed)
CALLED PATIENT TO INFORM OF LAB, CT AND FU VISIT, SPOKE WITH PATIENT AND SHE IS AWARE OF THESE APPTS.

## 2014-05-01 ENCOUNTER — Ambulatory Visit (INDEPENDENT_AMBULATORY_CARE_PROVIDER_SITE_OTHER): Payer: Medicare Other | Admitting: *Deleted

## 2014-05-01 ENCOUNTER — Telehealth: Payer: Self-pay | Admitting: Dietician

## 2014-05-01 DIAGNOSIS — Z5181 Encounter for therapeutic drug level monitoring: Secondary | ICD-10-CM

## 2014-05-01 DIAGNOSIS — G459 Transient cerebral ischemic attack, unspecified: Secondary | ICD-10-CM

## 2014-05-01 DIAGNOSIS — Z7901 Long term (current) use of anticoagulants: Secondary | ICD-10-CM

## 2014-05-01 DIAGNOSIS — I4891 Unspecified atrial fibrillation: Secondary | ICD-10-CM

## 2014-05-01 LAB — POCT INR: INR: 2.5

## 2014-05-01 NOTE — Telephone Encounter (Signed)
Brief Outpatient Oncology Nutrition Note  Patient has been identified to be at risk on malnutrition screen.  Wt Readings from Last 10 Encounters:  04/26/14 102 lb 4.8 oz (46.403 kg)  04/19/14 102 lb 9.6 oz (46.539 kg)  03/19/14 105 lb (47.628 kg)  02/21/14 106 lb 1.9 oz (48.136 kg)  02/19/14 108 lb (48.988 kg)  02/05/14 110 lb (49.896 kg)  01/18/14 107 lb (48.535 kg)  01/10/14 106 lb (48.081 kg)  12/21/13 109 lb 1.6 oz (49.487 kg)  11/21/13 110 lb (49.896 kg)    Dx:  Lung Cancer  Called patient due to weight loss.  Patient reports feeling weak and tired.  Eats 2-3 meals per day plus 1-2 snacks.  Eats high protein granola bars.    Gave tips to increase calories and protein.  Encouraged patient to increase amounts eaten.    [place] RD available as needed.  Antonieta Iba, RD, LDN

## 2014-05-02 ENCOUNTER — Ambulatory Visit
Admission: RE | Admit: 2014-05-02 | Discharge: 2014-05-02 | Disposition: A | Discharge status: Home / Self Care | Payer: Medicare Other | Source: Ambulatory Visit | Attending: Internal Medicine | Admitting: Internal Medicine

## 2014-05-02 ENCOUNTER — Other Ambulatory Visit: Payer: Self-pay | Admitting: Internal Medicine

## 2014-05-02 DIAGNOSIS — M810 Age-related osteoporosis without current pathological fracture: Secondary | ICD-10-CM

## 2014-05-02 DIAGNOSIS — Z8781 Personal history of (healed) traumatic fracture: Secondary | ICD-10-CM

## 2014-05-02 DIAGNOSIS — M549 Dorsalgia, unspecified: Secondary | ICD-10-CM

## 2014-05-09 ENCOUNTER — Telehealth: Payer: Self-pay | Admitting: *Deleted

## 2014-05-09 NOTE — Telephone Encounter (Signed)
Left msg on triage yesterday stating mom saw Dr. Buddy Duty today she was c/o of back pain so he done a xray. They called and stated the xray showed fracture in her back, and to contact her pcp for recommendations on what to do....Johny Chess

## 2014-05-09 NOTE — Telephone Encounter (Signed)
Notified pt daughter with md response. Made appt for tomorrow with Dr. Linna Darner @ 11:30...Johny Chess

## 2014-05-09 NOTE — Telephone Encounter (Signed)
Needs OV to eval - ?acute or chronic - any provider ok thanks

## 2014-05-10 ENCOUNTER — Ambulatory Visit (INDEPENDENT_AMBULATORY_CARE_PROVIDER_SITE_OTHER): Payer: Medicare Other | Admitting: Internal Medicine

## 2014-05-10 ENCOUNTER — Encounter: Payer: Self-pay | Admitting: Internal Medicine

## 2014-05-10 VITALS — BP 90/60 | HR 55 | Temp 97.8°F | Resp 14 | Wt 104.0 lb

## 2014-05-10 DIAGNOSIS — R609 Edema, unspecified: Secondary | ICD-10-CM

## 2014-05-10 DIAGNOSIS — I952 Hypotension due to drugs: Secondary | ICD-10-CM

## 2014-05-10 DIAGNOSIS — M810 Age-related osteoporosis without current pathological fracture: Secondary | ICD-10-CM

## 2014-05-10 DIAGNOSIS — R0781 Pleurodynia: Secondary | ICD-10-CM

## 2014-05-10 MED ORDER — TRAMADOL HCL 50 MG PO TABS: 50.0000 mg | ORAL_TABLET | Freq: Three times a day (TID) | ORAL | Status: DC | PRN

## 2014-05-10 NOTE — Progress Notes (Signed)
   Subjective:    Patient ID: Wendy Hopkins, female    DOB: [birth year], 80 y.o.   MRN: [id number]  HPI  She was  referred By Dr. Buddy Duty, Endocrinologist for further evaluation of vertebral fractures. She was in his office and complained of back pain. Films were performed which revealed multilevel disease and L3 compression fracture of questionable age  She has had 2 vertebroplasty procedures since 01/14. The second was of no benefit according to her.The fractures have been related to simply turning or standing and not associated with falls. Specifically she denies any cardiac or neuro prodrome with associated falls.  Despite this history suggesting pathologic fractures; her total protein was 6.7 on 4/1. On 01/23/14 there was no anemia.      Review of Systems   She does describe some abdominal pain as well as anorexia and constipation. She'll take MiraLax 1-2 times per day.  She has chronic dyspnea on exertion and is followed by Dr. Melvyn Novas. She has a history of lung cancer.     Objective:   Physical Exam   Positive or pertinent findings include: She appears chronically ill and her stated age There is dramatic loss of height. Affect is flat. There is profound curvature of the thoracic spine. She is tender to palpation over the left inferior thoracic ribs, not over the spine itself. There is evidence of pressure skin changes over the thoracic curvature without skin breakdown. Breath sounds are markedly decreased on the right; she has no increased work of breathing. Abdomen is protuberant and nontender Pedal pulses are decreased, especially dorsalis pedis pulses There is trace -1/2+ edema. She has chronic fingernail changes with marked deformities.  General appearance :adequately nourished; in no distress. Eyes: No conjunctival inflammation or scleral icterus is present. Oral exam: Dental hygiene is good. Lips and gums are healthy appearing.There is no oropharyngeal erythema or  exudate noted.  Heart:  Normal rate and regular rhythm. S1 and S2 normal without gallop, murmur, click, rub or other extra sounds  Abdomen: bowel sounds normal. No masses, organomegaly or hernias noted.  No guarding or rebound. No flank tenderness to percussion. Vascular : all pulses equal ; no bruits present. Skin:Warm & dry.  Intact without suspicious lesions or rashes ; no jaundice or tenting Lymphatic: No lymphadenopathy is noted about the head, neck, axilla              Assessment & Plan:  #1 she is actually having pain in the left inferior thoracic rib area, not the spine.  She does have evidence of previous vertebral compression fractures but none acute.The nature of the fractures suggest pathologic fractures due to some underlying process such as multiple myeloma in the colon however, her total protein is normal and there is no anemia. I see no point in pursuing this at this time.  #2 hypotension; spironolactone will be decreased to once daily  Plan: See orders and recommendations

## 2014-05-10 NOTE — Progress Notes (Signed)
Pre visit review using our clinic review tool, if applicable. No additional management support is needed unless otherwise documented below in the visit note. 

## 2014-05-10 NOTE — Patient Instructions (Addendum)
Decrease the spironolactone once a day because of the low blood pressure. Monitor the swelling on one pill a day. If blood pressure remains low; the atenolol may need to be discontinued.

## 2014-05-29 ENCOUNTER — Ambulatory Visit (INDEPENDENT_AMBULATORY_CARE_PROVIDER_SITE_OTHER): Payer: Medicare Other | Admitting: *Deleted

## 2014-05-29 DIAGNOSIS — Z5181 Encounter for therapeutic drug level monitoring: Secondary | ICD-10-CM

## 2014-05-29 DIAGNOSIS — Z7901 Long term (current) use of anticoagulants: Secondary | ICD-10-CM

## 2014-05-29 DIAGNOSIS — I4891 Unspecified atrial fibrillation: Secondary | ICD-10-CM

## 2014-05-29 LAB — POCT INR: INR: 3

## 2014-06-19 ENCOUNTER — Ambulatory Visit (INDEPENDENT_AMBULATORY_CARE_PROVIDER_SITE_OTHER): Payer: Medicare Other | Admitting: Internal Medicine

## 2014-06-19 ENCOUNTER — Encounter: Payer: Self-pay | Admitting: Internal Medicine

## 2014-06-19 ENCOUNTER — Other Ambulatory Visit (INDEPENDENT_AMBULATORY_CARE_PROVIDER_SITE_OTHER): Payer: Medicare Other

## 2014-06-19 VITALS — BP 120/78 | HR 60 | Ht <= 58 in | Wt 104.2 lb

## 2014-06-19 DIAGNOSIS — I5032 Chronic diastolic (congestive) heart failure: Secondary | ICD-10-CM

## 2014-06-19 DIAGNOSIS — R0600 Dyspnea, unspecified: Secondary | ICD-10-CM

## 2014-06-19 DIAGNOSIS — R918 Other nonspecific abnormal finding of lung field: Secondary | ICD-10-CM

## 2014-06-19 LAB — BASIC METABOLIC PANEL
BUN: 11 mg/dL (ref 6–23)
CALCIUM: 9.1 mg/dL (ref 8.4–10.5)
CHLORIDE: 92 meq/L — AB (ref 96–112)
CO2: 30 mEq/L (ref 19–32)
CREATININE: 1 mg/dL (ref 0.4–1.2)
GFR: 53.41 mL/min — AB (ref >60.00)
Glucose, Bld: 271 mg/dL — ABNORMAL HIGH (ref 70–99)
Potassium: 4.9 mEq/L (ref 3.5–5.1)
Sodium: 131 mEq/L — ABNORMAL LOW (ref 135–145)

## 2014-06-19 LAB — CBC WITH DIFFERENTIAL/PLATELET
BASOS ABS: 0 10*3/uL (ref 0.0–0.1)
BASOS PCT: 0.4 % (ref 0.0–3.0)
Eosinophils Absolute: 0 10*3/uL (ref 0.0–0.7)
Eosinophils Relative: 0.5 % (ref 0.0–5.0)
HEMATOCRIT: 41.8 % (ref 36.0–46.0)
Hemoglobin: 13.7 g/dL (ref 12.0–15.0)
LYMPHS ABS: 0.7 10*3/uL (ref 0.7–4.0)
Lymphocytes Relative: 11.2 % — ABNORMAL LOW (ref 12.0–46.0)
MCHC: 32.8 g/dL (ref 30.0–36.0)
MCV: 90.4 fl (ref 78.0–100.0)
MONO ABS: 0.5 10*3/uL (ref 0.1–1.0)
Monocytes Relative: 8.1 % (ref 3.0–12.0)
NEUTROS ABS: 4.7 10*3/uL (ref 1.4–7.7)
Neutrophils Relative %: 79.8 % — ABNORMAL HIGH (ref 43.0–77.0)
Platelets: 231 10*3/uL (ref 150.0–400.0)
RBC: 4.62 Mil/uL (ref 3.87–5.11)
RDW: 16 % — AB (ref 11.5–15.5)
WBC: 5.9 10*3/uL (ref 4.0–10.5)

## 2014-06-19 LAB — TSH: TSH: 2.52 u[IU]/mL (ref 0.35–4.50)

## 2014-06-19 NOTE — Progress Notes (Signed)
Subjective:    Patient ID: Wendy Hopkins, female    DOB: 1925-11-14    MRN: [id number]    Brief patient profile:  80 yowf never smoker only passive exposure referred 09/28/2012 by Dr Asa Lente for R pulmonary nodule found on w/u of back pain   History of Present Illness  09/28/2012 Wendy Hopkins/ 1st pulmonary eval cc doe x steps [facility] [employer] x years also with  Walking  "any distance in a hurry". No h/o hemoptysis.  Back pain better p T12 vertebroplasty 08/15/12 rec PET > Bx 11/08/12 Adenoca RLL  > completed RT Dec 09 2012     01/18/2014 f/u ov/[name] re: unexplained sob  Chief Complaint  Patient presents with  . Acute Visit    Pt states that her breathing has been worsening since March 2015. She is SOB just wlaking from room to room and woke up last night feeling SOB.  She has occ non prod cough.   indolent onset progressive doe then sob at rest assoc with dry cough/ ? Since started back on fosfamax  rec Stop fosfamax for now  Start pepcid ac 20 mg one after breakfast and one at bedtime  GERD diet   02/05/2014 f/u ov/[name] re: unexplained sob x March 2015  Chief Complaint  Patient presents with  . Acute Visit    Pt c/o increased DOE x 3 days.  She states that she gets SOB with very minimally exertion such as walking from lobby to exam room.    >>aldactone 25mg  Twice daily    02/19/2014 Follow up  Returns for 2 weeks follow up for DOE. Says over last several months to year more DOE , wears out with minimal activity.  No ambulatory desats.  BNP mildly elevated ~200, echo showed EF 55-60%, PAP 47, mod LA/RA dilation  LE edema on lasix. Last ov , aldactone added.  Had to decrease aldactone 25mg  daily -due to much urination. No sign change in edema or dyspnea/DOE  Has severe kyphosis w/ restriction on PFT  Family says she is sedentary , prev w/ compression fx w/ kyphoplasty.  Hx of  Stage IB Lung Adenoca Right > completed RT Dec 09 2012  Most recent CT chest 12/2013 w/ radiation changes of RLL w/  partial collapse , no mass identified. rec Continue on aldactone 25mg  daily .  Low salt diet  Legs elevated.  Activity as tolerated.     03/19/2014 f/u ov/[name] re: severe restriction s/p RT/ kyphotic chest Chief Complaint  Patient presents with  . Follow-up    Breathing is unchanged since last visit. No new co's today.   ? What can you not do because your breathing hold you back A get in a hurry. New abd pain, bloating p meals assoc with constipation, on miralax and fiber but has not increased the miralax yet. Leg swelling better on aldactone but not gone >>changed to Aldactazide     04/19/2014 Follow up Severe restriction s/p RT/Kyphotic Chest  Pt returns for 1 month follow up . Last ov with increased dyspnea and LE edema last ov.  Changed to Aldactazide . Says ankle edema resolved.  CXR last ov w/ no acute process. BNP minimally elevated ~200.  Says overall her breathing is doing fair w/ no flare in cough or wheezing.  rec No change rx Reduced aldactazide to one daily per [name] May 10 2014    06/19/2014 f/u ov/[name] re: restrictive lung dz  Chief Complaint  Patient presents with  . Follow-up  Breathing is unchanged since the last visit. No new co's today.   can still do [employer] leaning on cart   / nothing more than this/ no 02 needed/ no pulmonary meds at all    In terms of doe>  no obvious day to day or daytime variabilty or assoc chronic cough or cp or chest tightness, subjective wheeze overt sinus or hb symptoms. No unusual exp hx or h/o childhood pna/ asthma or knowledge of premature birth.  Sleeping ok without nocturnal  or early am exacerbation  of respiratory  c/o's or need for noct saba. Also denies any obvious fluctuation of symptoms with weather or environmental changes or other aggravating or alleviating factors except as outlined above   Current Medications, Allergies, Complete Past Medical History, Past Surgical History, Family History, and Social History  were reviewed in [employer] record.  ROS  The following are not active complaints unless bolded sore throat, dysphagia, dental problems, itching, sneezing,  nasal congestion or excess/ purulent secretions, ear ache,   fever, chills, sweats, unintended wt loss, pleuritic or exertional cp, hemoptysis,  orthopnea pnd or leg swelling, presyncope, palpitations, heartburn, abdominal pain, anorexia, nausea, vomiting, diarrhea  or change in bowel or urinary habits, change in stools or urine, dysuria,hematuria,  rash, arthralgias, visual complaints, headache, numbness weakness or ataxia or problems with walking or coordination,  change in mood/affect or memory.                       Objective:   Physical Exam  Elderly wf nad  01/18/2014        107 > 02/05/2014   110>108 02/19/2014 > 03/19/2014 105 >>102 04/19/2014 > 06/19/2014 104   HEENT: nl dentition, turbinates, and orophanx. Nl external ear canals without cough reflex   NECK :  without JVD/Nodes/TM/ nl carotid upstrokes bilaterally   LUNGS: no acc muscle use, clear to A and P bilaterally without cough on insp or exp maneuvers Severe throracic kyphosis    CV:  RRR  no s3 or murmur or increase in P2, no sign lower ext pitting   ABD:  soft and nontender with nl excursion in the supine position. No bruits or organomegaly, bowel sounds nl  MS:  warm  withou calf tenderness, cyanosis or clubbing. Moderate -severe Thoracic kyphosis  SKIN: warm and dry without lesions       Ct chest s contrast 04/24/14 1. Right lower lobe masslike consolidation is decreased in size from previous exam. This may reflect changes secondary to external beam radiation. Underlying tumor would be difficult to exclude and continued followup advise. 2. A few scattered nodules are identified at the right base, not seen on previous exam. The appearance is nonspecific and may reflect changes secondary to inflammation. Attention on follow-up  exam recommended. 3. No evidence for distant metastatic disease. 4. Marked osteopenia with numerous thoracic compression deformities, rib fractures and sternal fracture. The thoracic spine appears kyphotic.    Recent Labs Lab 06/19/14 1043  NA 131*  K 4.9  CL 92*  CO2 30  BUN 11  CREATININE 1.0  GLUCOSE 271*    Recent Labs Lab 06/19/14 1043  HGB 13.7  HCT 41.8  WBC 5.9  PLT 231.0     Lab Results  Component Value Date   TSH 2.52 06/19/2014     Lab Results  Component Value Date   PROBNP 236.0* 04/19/2014         Assessment & Plan:

## 2014-06-19 NOTE — Progress Notes (Signed)
Quick Note:  Spoke with pt and notified of results per Dr. [name]. Pt verbalized understanding and denied any questions.  ______ 

## 2014-06-19 NOTE — Assessment & Plan Note (Signed)
Mild and well compensated with no peripheral edema on min doses of aldactazide, no pulmonary f/u planned

## 2014-06-19 NOTE — Assessment & Plan Note (Signed)
CT 08/22/12: RLL mass 3.6cm x2.3cm - Spirometry 09/29/12 FEV1  1.08 (77%) with ratio 67 / poor effort on f/v loop -  PET 10/18/2012  C/w IB dz > PET > Bx 11/08/12 Adenoca > completed RT Dec 09 2012   Further improvement on ct 04/24/14 > no f/u planned here

## 2014-06-19 NOTE — Patient Instructions (Addendum)
Please remember to go to the lab department downstairs for your tests - we will call you with the results when they are available.  Double your miralax to see if helps constipation and if not see your primary care doctor    If you are satisfied with your treatment plan,  let your doctor know and he/she can either refill your medications or you can return here when your prescription runs out.     If in any way you are not 100% satisfied,  please tell us.  If 100% better, tell your friends!  Pulmonary follow up is as needed

## 2014-06-19 NOTE — Assessment & Plan Note (Signed)
06/25/13 Echo - Left ventricle: The cavity size was normal. Wall thickness was increased in a pattern of mild LVH. Systolic function was normal. The estimated ejection fraction was in the range of 55% to 60%. - Aortic valve: Trivial regurgitation. - Left atrium: The atrium was moderately dilated. - Right atrium: The atrium was moderately dilated. - Atrial septum: No defect or patent foramen ovale was identified. - Tricuspid valve: Moderate regurgitation. - Pulmonary arteries: PA peak pressure: 72mm Hg - 01/18/2014   Walked RA x one lap @ 185 stopped due to  Fatigue > sob, no desat - 02/05/2014   Walked RA x one lap @ 185 stopped due to tired, legs  Gave out, unsteady on feet but no desats   - 03/19/2014   Walked RA x 3 laps @ 185 ft each stopped p each lap due to fatigue but not sob/ no desat/ moderate pace  This is multifactorial but mostly related to restrictive changes from thoracic kyphosis and residual effect of RT to R LL  Cautioned about need to address constipation / bloating due to interdependence of the thoracic and abd compartments   No need for pulmonary f/u unless worsens over present baseline

## 2014-06-27 ENCOUNTER — Ambulatory Visit (INDEPENDENT_AMBULATORY_CARE_PROVIDER_SITE_OTHER): Payer: Medicare Other

## 2014-06-27 DIAGNOSIS — Z7901 Long term (current) use of anticoagulants: Secondary | ICD-10-CM

## 2014-06-27 DIAGNOSIS — I4891 Unspecified atrial fibrillation: Secondary | ICD-10-CM

## 2014-06-27 DIAGNOSIS — Z5181 Encounter for therapeutic drug level monitoring: Secondary | ICD-10-CM

## 2014-06-27 LAB — POCT INR: INR: 2.1

## 2014-07-11 ENCOUNTER — Ambulatory Visit (INDEPENDENT_AMBULATORY_CARE_PROVIDER_SITE_OTHER): Payer: Medicare Other | Admitting: Internal Medicine

## 2014-07-11 ENCOUNTER — Encounter: Payer: Self-pay | Admitting: Internal Medicine

## 2014-07-11 VITALS — BP 122/64 | HR 54 | Ht <= 58 in | Wt 102.0 lb

## 2014-07-11 DIAGNOSIS — I482 Chronic atrial fibrillation, unspecified: Secondary | ICD-10-CM

## 2014-07-11 NOTE — Progress Notes (Signed)
Patient Care Team: Rowe Clack, MD as PCP - General (Internal Medicine) Delrae Rend, MD as Consulting Physician (Endocrinology) Selinda Orion, MD as Consulting Physician (Obstetrics and Gynecology) Deboraha Sprang, MD as Consulting Physician (Cardiology) Armandina Gemma, MD as Consulting Physician (General Surgery) Mayme Genta, MD (Gastroenterology) Tanda Rockers, MD as Attending Physician (Pulmonary Disease) Marye Round, MD (Radiation Oncology)   HPI  Wendy Hopkins is a 80 y.o. female Seen in followup for atrial fibrillation with a CHADS score of 5-6 with a prior TIA/stroke  She was seen in December because of shortness of breath that was thought to be in part related to volume overload as well as bradycardia. Her beta blockers were discontinued and a diuretic initiated.  She was   found to have poorly controlled ventricular response of her presumed to be permanent atrial fibrillation; Her atenolol was resumed   At her last visit she was complaining of shortness of breath. CT scan demonstrated partial lung collapse  follow-up CT 9/15 was improved.  She has been undergoing radiation therapy for adenocarcinoma in her long   06/25/13 Echo - Left ventricle: The cavity size was normal. Wall thickness was increased in a pattern of mild LVH. Systolic function was normal. The estimated ejection fraction was in the range of 55% to 60%. - Aortic valve: Trivial regurgitation. - Left atrium: The atrium was moderately dilated. - Right atrium: The atrium was moderately dilated. - Atrial septum: No defect or patent foramen ovale was identified.  Metabolic profile 72/53 demonstrated potassium of 4.9 on Aldactone  Past Medical History  Diagnosis Date  . Arthritis   . Hyperlipidemia   . 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 Closed fracture of unspecified part of femur 2008  . TIA (transient ischemic attack)   . Edema   . Chronic diastolic  heart failure   . Osteoporosis with fracture     compression fx lower T spine and t4, L rib  . Thyroid nodule     dominate R nodule, s/p aspiration>abn path>6 mo obs planned ([facility] 06/01/11)  . GERD (gastroesophageal reflux disease)   . Hypertension 2006  . Atrial fibrillation     chronic anticoag; and AFlutter  . History of radiation therapy 11/30/2012-12/09/2012    50 gray to right chest  . Diabetes mellitus, type II, insulin dependent     over 40 years ago  . Vertebral compression fracture     Thoracic 12 and Thoracic 7  . Adenocarcinoma, lung dx 11/08/12    s/p CT chest bx - XRT thru 12/2012    Past Surgical History  Procedure Laterality Date  . Right femur  06/25/07    Intramedullary open reduction internal fixation  . Vitrectomy  2007    right eye with cataract repair - dr. Cordelia Pen  . Tonsillectomy  1947    Current Outpatient Prescriptions  Medication Sig Dispense Refill  . ACCU-CHEK AVIVA test strip 1 each 3 (three) times daily. Check twice daily    . acetaminophen (TYLENOL) 500 MG tablet Take 2 tablets (1,000 mg total) by mouth every 8 (eight) hours as needed for moderate pain. 30 tablet 0  . atenolol (TENORMIN) 25 MG tablet TAKE 1/2 TABLET BY MOUTH EVERY DAY 15 tablet 11  . atorvastatin (LIPITOR) 10 MG tablet Take 10 mg by mouth daily.    . Cholecalciferol (VITAMIN D) 2000 UNITS CAPS Take 2,000 Units by mouth daily.      Marland Kitchen  denosumab (PROLIA) 60 MG/ML SOLN injection Inject 60 mg into the skin every 6 (six) months. Administer in upper arm, thigh, or abdomen 1 mL 0  . furosemide (LASIX) 40 MG tablet Take 2 tablets (80 mg total) by mouth daily. 30 tablet 5  . insulin glargine (LANTUS) 100 UNIT/ML injection Inject 8 Units into the skin daily.     . insulin lispro (HUMALOG) 100 UNIT/ML injection Inject 3 Units into the skin 3 (three) times daily before meals.    . Insulin Syringe-Needle U-100 (INSULIN SYRINGE 1CC/30GX5/16") 30G X 5/16" 1 ML MISC Use four times a day    .  Lancets (FREESTYLE) lancets Use three times a day to check blood sugar    . Simethicone (GAS-X PO) Take 2 tablets by mouth daily.    Marland Kitchen spironolactone (ALDACTONE) 25 MG tablet Take 1 tablet (25 mg total) by mouth daily.    Marland Kitchen warfarin (COUMADIN) 5 MG tablet TAKE 1 TABLET BY MOUTH EVERY DAY EXECPT ON MONDAY AND THURSDAY TAKE 1/2 TABLET (Patient taking differently: TAKE 1 TABLET BY MOUTH EVERY DAY EXECPT ON MONDAY AND FRIDAY TAKE 1/2 TABLET) 40 tablet 3   No current facility-administered medications for this visit.    Allergies  Allergen Reactions  . Codeine Nausea And Vomiting         Review of Systems negative except from HPI and PMH  Physical Exam BP 122/64 mmHg  Pulse 54  Ht 4\' 10"  (1.473 m)  Wt 102 lb (46.267 kg)  BMI 21.32 kg/m2 Well developed and well nourished in no acute distress HENT normal E scleral and icterus clear Neck Supple Markedly decreased breath sounds on the right chest  Regular rate and rhythm, no murmurs gallops or rub Soft with active bowel sounds No clubbing cyanosis Trace Edema Alert and oriented, grossly normal motor and sensory function Skin Warm and Dry  ECG demonstrates atrial fibrillation at 61 intervals-/08/42  Assessment and  Plan  Atrial fibrillation-permanent  slow  Dyspnea on exertion  Decreased breath sounds right chest  Will stop her atenolol as HR is slow  Her dyspnea is multifactorial and significantly contributed to by the mechanics related to her kyphosis. Her daughter's observation that she walks better with a grocery cart suggest that there is a role for physical therapy to improve posture which may translate into less dyspnea. I will have her follow this up with Dr. VL when they see her in the next few weeks.  We discussed the use of the NOACs compared to Coumadin. We briefly reviewed the data of at least comparability in stroke prevention, bleeding and outcome. We discussed some of the new once wherein somewhat associated with  decreased ischemic stroke risk, one to be taken daily, and has been shown to be comparable and bleeding risk to aspirin.  We also discussed bleeding associated with warfarin as well as NOACs and a wall bleeding as a complication of all these drugs intracranial bleeding is more frequently associated with warfarin then the NOACs and a GI bleeding is more commonly associated with the latter We will wait to hear when she has to say about the alternatives.

## 2014-07-11 NOTE — Patient Instructions (Addendum)
Your physician has recommended you make the following change in your medication:  1) STOP Atenolol  Please call and check on cost of the following anticoagulants. Let us know if you would like to switch to one of these.  Eliquis  Xarelto  Pradaxa  [id number]   Your physician wants you to follow-up in: 1 year with Dr. Caryl Comes.  You will receive a reminder letter in the mail two months in advance. If you don't receive a letter, please call our office to schedule the follow-up appointment.

## 2014-07-13 ENCOUNTER — Other Ambulatory Visit: Payer: Self-pay | Admitting: Internal Medicine

## 2014-07-14 NOTE — Telephone Encounter (Signed)
Rx was sent to pharmacy electronically. 

## 2014-07-25 ENCOUNTER — Ambulatory Visit (INDEPENDENT_AMBULATORY_CARE_PROVIDER_SITE_OTHER): Payer: Medicare Other

## 2014-07-25 DIAGNOSIS — Z5181 Encounter for therapeutic drug level monitoring: Secondary | ICD-10-CM

## 2014-07-25 DIAGNOSIS — Z7901 Long term (current) use of anticoagulants: Secondary | ICD-10-CM

## 2014-07-25 DIAGNOSIS — I4891 Unspecified atrial fibrillation: Secondary | ICD-10-CM

## 2014-07-25 LAB — POCT INR: INR: 2.8

## 2014-08-27 ENCOUNTER — Ambulatory Visit
Admission: RE | Admit: 2014-08-27 | Discharge: 2014-08-27 | Disposition: A | Discharge status: Home / Self Care | Payer: Medicare Other | Source: Ambulatory Visit | Attending: Radiation Oncology | Admitting: Radiation Oncology

## 2014-08-27 ENCOUNTER — Ambulatory Visit (HOSPITAL_COMMUNITY)
Admission: RE | Admit: 2014-08-27 | Discharge: 2014-08-27 | Disposition: A | Discharge status: Home / Self Care | Payer: Medicare Other | Source: Ambulatory Visit | Attending: Radiation Oncology | Admitting: Radiation Oncology

## 2014-08-27 DIAGNOSIS — C3430 Malignant neoplasm of lower lobe, unspecified bronchus or lung: Secondary | ICD-10-CM | POA: Diagnosis not present

## 2014-08-27 DIAGNOSIS — Z923 Personal history of irradiation: Secondary | ICD-10-CM | POA: Insufficient documentation

## 2014-08-27 DIAGNOSIS — I2510 Atherosclerotic heart disease of native coronary artery without angina pectoris: Secondary | ICD-10-CM | POA: Diagnosis not present

## 2014-08-27 DIAGNOSIS — R05 Cough: Secondary | ICD-10-CM | POA: Insufficient documentation

## 2014-08-27 DIAGNOSIS — I517 Cardiomegaly: Secondary | ICD-10-CM | POA: Diagnosis not present

## 2014-08-27 DIAGNOSIS — Z85118 Personal history of other malignant neoplasm of bronchus and lung: Secondary | ICD-10-CM | POA: Diagnosis not present

## 2014-08-27 LAB — BUN AND CREATININE (CC13)
BUN: 19.8 mg/dL (ref 7.0–26.0)
CREATININE: 1.1 mg/dL (ref 0.6–1.1)
EGFR: 43 mL/min/{1.73_m2} — AB (ref >90)

## 2014-08-27 MED ORDER — IOHEXOL 300 MG/ML  SOLN
80.0000 mL | Freq: Once | INTRAMUSCULAR | Status: AC | PRN
  Administered 2014-08-27: 80 mL via INTRAVENOUS

## 2014-08-28 ENCOUNTER — Encounter: Payer: Medicare Other | Admitting: Internal Medicine

## 2014-08-30 ENCOUNTER — Ambulatory Visit: Admission: RE | Admit: 2014-08-30 | Payer: Medicare Other | Source: Ambulatory Visit | Admitting: Radiation Oncology

## 2014-08-30 ENCOUNTER — Telehealth: Payer: Self-pay | Admitting: *Deleted

## 2014-08-30 DIAGNOSIS — K3184 Gastroparesis: Secondary | ICD-10-CM | POA: Diagnosis not present

## 2014-08-30 DIAGNOSIS — E042 Nontoxic multinodular goiter: Secondary | ICD-10-CM | POA: Diagnosis not present

## 2014-08-30 DIAGNOSIS — E1040 Type 1 diabetes mellitus with diabetic neuropathy, unspecified: Secondary | ICD-10-CM | POA: Diagnosis not present

## 2014-08-30 DIAGNOSIS — M810 Age-related osteoporosis without current pathological fracture: Secondary | ICD-10-CM | POA: Diagnosis not present

## 2014-08-30 NOTE — Telephone Encounter (Signed)
Voice message left from daughter Lenna Sciara, cancelling tomorrows  Follow visit due to inclement weather, had patients [address] done and asked if Dr. Lisbeth Renshaw  Could call her the results and was it necessary to reschedule I phoned daughter back and informed her as MD was in another office tuill 4pm today and will discuss with him and will call [name](daughter back with status,[name] thanked me for returning her call so quickly 9:48 AM

## 2014-08-31 ENCOUNTER — Encounter: Payer: Self-pay | Admitting: Radiation Oncology

## 2014-08-31 ENCOUNTER — Other Ambulatory Visit: Payer: Self-pay | Admitting: Radiation Oncology

## 2014-08-31 ENCOUNTER — Ambulatory Visit: Admission: RE | Admit: 2014-08-31 | Payer: Medicare Other | Source: Ambulatory Visit | Admitting: Radiation Oncology

## 2014-08-31 DIAGNOSIS — C3430 Malignant neoplasm of lower lobe, unspecified bronchus or lung: Secondary | ICD-10-CM

## 2014-08-31 NOTE — Progress Notes (Signed)
I called and spoke to the patient today about her recent CT scan of the chest. This was largely stable but didn't show some small changes. The patient was unable to come to clinic today due to the weather today. We discussed possible options including pursuing a PET scan to gain further information. Otherwise, we discussed a short-term follow-up with repeat CT imaging. After a detailed discussion with the patient regarding the pros and cons of both approaches, she indicated that she wished to proceed with a repeat CT scan. We will discuss further the possibility of additional measures depending on that result. I will order a CT scan of the chest to be repeated in 3 months.

## 2014-09-03 ENCOUNTER — Telehealth: Payer: Self-pay | Admitting: *Deleted

## 2014-09-03 NOTE — Telephone Encounter (Signed)
CALLED PATIENT TO INFORM OF STAT LABS, CT AND FU VISIT, SPOKE WITH PATIENT AND SHE IS AWARE OF THESE APPTS.

## 2014-09-05 ENCOUNTER — Ambulatory Visit (INDEPENDENT_AMBULATORY_CARE_PROVIDER_SITE_OTHER): Payer: Medicare Other

## 2014-09-05 DIAGNOSIS — I4891 Unspecified atrial fibrillation: Secondary | ICD-10-CM

## 2014-09-05 DIAGNOSIS — Z7901 Long term (current) use of anticoagulants: Secondary | ICD-10-CM

## 2014-09-05 DIAGNOSIS — Z5181 Encounter for therapeutic drug level monitoring: Secondary | ICD-10-CM

## 2014-09-05 LAB — POCT INR: INR: 2

## 2014-09-17 ENCOUNTER — Ambulatory Visit (INDEPENDENT_AMBULATORY_CARE_PROVIDER_SITE_OTHER): Payer: Medicare Other

## 2014-09-17 ENCOUNTER — Encounter: Payer: Self-pay | Admitting: Podiatry

## 2014-09-17 ENCOUNTER — Ambulatory Visit (INDEPENDENT_AMBULATORY_CARE_PROVIDER_SITE_OTHER): Payer: Medicare Other | Admitting: Podiatry

## 2014-09-17 VITALS — BP 114/87 | HR 77 | Resp 15 | Ht 60.0 in | Wt 100.0 lb

## 2014-09-17 DIAGNOSIS — L02611 Cutaneous abscess of right foot: Secondary | ICD-10-CM | POA: Diagnosis not present

## 2014-09-17 DIAGNOSIS — R52 Pain, unspecified: Secondary | ICD-10-CM

## 2014-09-17 DIAGNOSIS — B351 Tinea unguium: Secondary | ICD-10-CM

## 2014-09-17 DIAGNOSIS — M79676 Pain in unspecified toe(s): Secondary | ICD-10-CM

## 2014-09-17 DIAGNOSIS — L03031 Cellulitis of right toe: Secondary | ICD-10-CM | POA: Diagnosis not present

## 2014-09-17 MED ORDER — CEPHALEXIN 500 MG PO CAPS: 500.0000 mg | ORAL_CAPSULE | Freq: Two times a day (BID) | ORAL | Status: DC

## 2014-09-17 NOTE — Patient Instructions (Signed)
Diabetes and Foot Care Diabetes may cause you to have problems because of poor blood supply (circulation) to your feet and legs. This may cause the skin on your feet to become thinner, break easier, and heal more slowly. Your skin may become dry, and the skin may peel and crack. You may also have nerve damage in your legs and feet causing decreased feeling in them. You may not notice minor injuries to your feet that could lead to infections or more serious problems. Taking care of your feet is one of the most important things you can do for yourself.  HOME CARE INSTRUCTIONS  Wear shoes at all times, even in the house. Do not go barefoot. Bare feet are easily injured.  Check your feet daily for blisters, cuts, and redness. If you cannot see the bottom of your feet, use a mirror or ask someone for help.  Wash your feet with warm water (do not use hot water) and mild soap. Then pat your feet and the areas between your toes until they are completely dry. Do not soak your feet as this can dry your skin.  Apply a moisturizing lotion or petroleum jelly (that does not contain alcohol and is unscented) to the skin on your feet and to dry, brittle toenails. Do not apply lotion between your toes.  Trim your toenails straight across. Do not dig under them or around the cuticle. File the edges of your nails with an emery board or nail file.  Do not cut corns or calluses or try to remove them with medicine.  Wear clean socks or stockings every day. Make sure they are not too tight. Do not wear knee-high stockings since they may decrease blood flow to your legs.  Wear shoes that fit properly and have enough cushioning. To break in new shoes, wear them for just a few hours a day. This prevents you from injuring your feet. Always look in your shoes before you put them on to be sure there are no objects inside.  Do not cross your legs. This may decrease the blood flow to your feet.  If you find a minor scrape,  cut, or break in the skin on your feet, keep it and the skin around it clean and dry. These areas may be cleansed with mild soap and water. Do not cleanse the area with peroxide, alcohol, or iodine.  When you remove an adhesive bandage, be sure not to damage the skin around it.  If you have a wound, look at it several times a day to make sure it is healing.  Do not use heating pads or hot water bottles. They may burn your skin. If you have lost feeling in your feet or legs, you may not know it is happening until it is too late.  Make sure your health care provider performs a complete foot exam at least annually or more often if you have foot problems. Report any cuts, sores, or bruises to your health care provider immediately. SEEK MEDICAL CARE IF:   You have an injury that is not healing.  You have cuts or breaks in the skin.  You have an ingrown nail.  You notice redness on your legs or feet.  You feel burning or tingling in your legs or feet.  You have pain or cramps in your legs and feet.  Your legs or feet are numb.  Your feet always feel cold. SEEK IMMEDIATE MEDICAL CARE IF:   There is increasing redness,   swelling, or pain in or around a wound.  There is a red line that goes up your leg.  Pus is coming from a wound.  You develop a fever or as directed by your health care provider.  You notice a bad smell coming from an ulcer or wound. Document Released: 07/24/2000 Document Revised: 03/29/2013 Document Reviewed: 01/03/2013 [employer] Patient Information 2015 [employer]. This information is not intended to replace advice given to you by your health care provider. Make sure you discuss any questions you have with your health care provider.  

## 2014-09-17 NOTE — Progress Notes (Signed)
   Subjective:    Patient ID: Wendy Hopkins, female    DOB: Jan 23, 1926, 81 y.o.   MRN: [id number]  HPI Comments: N corns L right 5th medial and 4th lateral toes D over 1 month O C hard painful skin A enclosed shoes T neosporin and referred by Dr. Minette Brine  Pt request debridement of 10 toenails and diabetic exam.   Patient denies any history of foot ulceration or claudication   Review of Systems  Musculoskeletal: Positive for back pain.  All other systems reviewed and are negative.      Objective:   Physical Exam  Orientated 3 patient presents with daughter present in room  Vascular: DP pulses 2/4 bilaterally PT pulses 2/4 bilaterally  Neurological: Sensation to 10 g monofilament wire intact 5/5 bilaterally Vibratory sensation nonreactive bilaterally Ankle reflex equal and reactive bilaterally  Dermatological: The toenails are elongated, hypertrophic, incurvated and tender to palpation 6-10 There is low-grade erythema and edema in the fifth right toe Small bleeding callus medial fifth right toe after debridement remains closed Dry skin noted bilaterally  Musculoskeletal: HAV deformities bilaterally  X-ray examination right foot  Initial x-rays demonstrated suspect artifacts in the fifth metatarsal and proximal phalanx fifth toe Repeat x-ray same views demonstrate no x-ray artifact suggested a fracture, dislocation or bone activity in the fifth metatarsal toe area.  Radiographic impression: No acute bony abnormality noted in the right foot No x-ray evidence of osteomyelitis in fifth toe area     Assessment & Plan:   Assessment: Satisfactory vascular status Diminished vibratory sensation suggested of diabetic peripheral neuropathy Low-grade cellulitis fifth right toe with bleeding callus Symptomatic onychomycoses 6-10  Plan: Debrided bleeding callus and dispensed gelcap to place over fifth toe Wear soft opens shoes to avoid pressure and fifth  toe Prescribe cephalexin 500 mg twice a day 7 days Debridement of toenails 10 without a bleeding  Reappoint 2 weeks

## 2014-09-20 ENCOUNTER — Other Ambulatory Visit: Payer: Self-pay | Admitting: General Practice

## 2014-09-20 ENCOUNTER — Other Ambulatory Visit: Payer: Self-pay | Admitting: Internal Medicine

## 2014-09-20 MED ORDER — WARFARIN SODIUM 5 MG PO TABS: ORAL_TABLET | ORAL | Status: DC

## 2014-10-01 ENCOUNTER — Ambulatory Visit (INDEPENDENT_AMBULATORY_CARE_PROVIDER_SITE_OTHER): Payer: Medicare Other | Admitting: Podiatry

## 2014-10-01 ENCOUNTER — Encounter: Payer: Self-pay | Admitting: Podiatry

## 2014-10-01 VITALS — BP 154/75 | HR 93 | Temp 97.8°F | Resp 12

## 2014-10-01 DIAGNOSIS — L84 Corns and callosities: Secondary | ICD-10-CM | POA: Diagnosis not present

## 2014-10-01 DIAGNOSIS — E119 Type 2 diabetes mellitus without complications: Secondary | ICD-10-CM

## 2014-10-01 NOTE — Progress Notes (Signed)
Patient ID: Wendy Hopkins, female   DOB: [birth year], 81 y.o.   MRN: [id number] Subjective: This patient presents for follow-up care for low-grade cellulitis fifth right toe with associated distal medial keratoses. Cephalexin 500 mg twice a day 7 days was prescribed on the initial counter of 09/17/2014. Patient is wearing soft shoe that does not compress toes and found that the gelcap Place of the fifth toe provided no relief  Objective: Orientated 3 patient presents with daughter present in the room The fifth right toe is mildly edematous without any obvious erythema. The distal medial fifth right toe has small keratoses with 3 punctate bleeding areas within the keratoses that remain closed after debridement. This area is very tender.  Assessment: Pre-ulcerative keratoses fifth right toe  Plan: Debrided keratoses and apply protective foam pad (nonmedicated) around the areas and attach daily with Coflex tape do not overtighten. Her daughter was instructed not to do this. Continue doing this daily until symptoms resolve  Patient return if symptoms do not gradually subside otherwise will reevaluate 3 months

## 2014-10-01 NOTE — Patient Instructions (Addendum)
Apply a nonmedicated cushioning around the corn on the fifth right toe and attach with one-inch Coflex. Change when wet Diabetes and Foot Care Diabetes may cause you to have problems because of poor blood supply (circulation) to your feet and legs. This may cause the skin on your feet to become thinner, break easier, and heal more slowly. Your skin may become dry, and the skin may peel and crack. You may also have nerve damage in your legs and feet causing decreased feeling in them. You may not notice minor injuries to your feet that could lead to infections or more serious problems. Taking care of your feet is one of the most important things you can do for yourself.  HOME CARE INSTRUCTIONS  Wear shoes at all times, even in the house. Do not go barefoot. Bare feet are easily injured.  Check your feet daily for blisters, cuts, and redness. If you cannot see the bottom of your feet, use a mirror or ask someone for help.  Wash your feet with warm water (do not use hot water) and mild soap. Then pat your feet and the areas between your toes until they are completely dry. Do not soak your feet as this can dry your skin.  Apply a moisturizing lotion or petroleum jelly (that does not contain alcohol and is unscented) to the skin on your feet and to dry, brittle toenails. Do not apply lotion between your toes.  Trim your toenails straight across. Do not dig under them or around the cuticle. File the edges of your nails with an emery board or nail file.  Do not cut corns or calluses or try to remove them with medicine.  Wear clean socks or stockings every day. Make sure they are not too tight. Do not wear knee-high stockings since they may decrease blood flow to your legs.  Wear shoes that fit properly and have enough cushioning. To break in new shoes, wear them for just a few hours a day. This prevents you from injuring your feet. Always look in your shoes before you put them on to be sure there are no  objects inside.  Do not cross your legs. This may decrease the blood flow to your feet.  If you find a minor scrape, cut, or break in the skin on your feet, keep it and the skin around it clean and dry. These areas may be cleansed with mild soap and water. Do not cleanse the area with peroxide, alcohol, or iodine.  When you remove an adhesive bandage, be sure not to damage the skin around it.  If you have a wound, look at it several times a day to make sure it is healing.  Do not use heating pads or hot water bottles. They may burn your skin. If you have lost feeling in your feet or legs, you may not know it is happening until it is too late.  Make sure your health care provider performs a complete foot exam at least annually or more often if you have foot problems. Report any cuts, sores, or bruises to your health care provider immediately. SEEK MEDICAL CARE IF:   You have an injury that is not healing.  You have cuts or breaks in the skin.  You have an ingrown nail.  You notice redness on your legs or feet.  You feel burning or tingling in your legs or feet.  You have pain or cramps in your legs and feet.  Your legs or  feet are numb.  Your feet always feel cold. SEEK IMMEDIATE MEDICAL CARE IF:   There is increasing redness, swelling, or pain in or around a wound.  There is a red line that goes up your leg.  Pus is coming from a wound.  You develop a fever or as directed by your health care provider.  You notice a bad smell coming from an ulcer or wound. Document Released: 07/24/2000 Document Revised: 03/29/2013 Document Reviewed: 01/03/2013 [facility] Patient Information 2015 [place], [place]. This information is not intended to replace advice given to you by your health care provider. Make sure you discuss any questions you have with your health care provider.

## 2014-10-17 ENCOUNTER — Ambulatory Visit (INDEPENDENT_AMBULATORY_CARE_PROVIDER_SITE_OTHER): Payer: Medicare Other | Admitting: General Practice

## 2014-10-17 DIAGNOSIS — Z5181 Encounter for therapeutic drug level monitoring: Secondary | ICD-10-CM | POA: Diagnosis not present

## 2014-10-17 DIAGNOSIS — Z7901 Long term (current) use of anticoagulants: Secondary | ICD-10-CM

## 2014-10-17 DIAGNOSIS — I4891 Unspecified atrial fibrillation: Secondary | ICD-10-CM

## 2014-10-17 LAB — POCT INR: INR: 1.6

## 2014-10-17 NOTE — Progress Notes (Signed)
Pre visit review using our clinic review tool, if applicable. No additional management support is needed unless otherwise documented below in the visit note. 

## 2014-10-17 NOTE — Progress Notes (Signed)
Agree with plan 

## 2014-10-26 ENCOUNTER — Encounter: Payer: Self-pay | Admitting: Radiation Oncology

## 2014-11-07 ENCOUNTER — Encounter: Payer: Self-pay | Admitting: Internal Medicine

## 2014-11-07 ENCOUNTER — Other Ambulatory Visit (INDEPENDENT_AMBULATORY_CARE_PROVIDER_SITE_OTHER): Payer: Medicare Other

## 2014-11-07 ENCOUNTER — Ambulatory Visit (INDEPENDENT_AMBULATORY_CARE_PROVIDER_SITE_OTHER): Payer: Medicare Other | Admitting: Internal Medicine

## 2014-11-07 VITALS — BP 118/68 | HR 84 | Temp 97.5°F | Ht 60.0 in | Wt 101.0 lb

## 2014-11-07 DIAGNOSIS — E119 Type 2 diabetes mellitus without complications: Secondary | ICD-10-CM

## 2014-11-07 DIAGNOSIS — Z794 Long term (current) use of insulin: Secondary | ICD-10-CM

## 2014-11-07 DIAGNOSIS — Z0000 Encounter for general adult medical examination without abnormal findings: Secondary | ICD-10-CM | POA: Diagnosis not present

## 2014-11-07 DIAGNOSIS — M810 Age-related osteoporosis without current pathological fracture: Secondary | ICD-10-CM | POA: Diagnosis not present

## 2014-11-07 DIAGNOSIS — I10 Essential (primary) hypertension: Secondary | ICD-10-CM | POA: Diagnosis not present

## 2014-11-07 DIAGNOSIS — I482 Chronic atrial fibrillation, unspecified: Secondary | ICD-10-CM

## 2014-11-07 DIAGNOSIS — Z23 Encounter for immunization: Secondary | ICD-10-CM | POA: Diagnosis not present

## 2014-11-07 LAB — LIPID PANEL
CHOLESTEROL: 151 mg/dL (ref 0–200)
HDL: 59.4 mg/dL (ref >39.00)
LDL CALC: 75 mg/dL (ref 0–99)
NonHDL: 91.6
Total CHOL/HDL Ratio: 3
Triglycerides: 84 mg/dL (ref 0.0–149.0)
VLDL: 16.8 mg/dL (ref 0.0–40.0)

## 2014-11-07 LAB — HEMOGLOBIN A1C: HEMOGLOBIN A1C: 8.9 % — AB (ref 4.6–6.5)

## 2014-11-07 MED ORDER — TETANUS-DIPHTHERIA TOXOIDS TD 2-2 LF/0.5ML IM SUSP: 0.5000 mL | Freq: Once | INTRAMUSCULAR | Status: DC

## 2014-11-07 MED ORDER — DENOSUMAB 60 MG/ML ~~LOC~~ SOLN
60.0000 mg | Freq: Once | SUBCUTANEOUS | Status: AC
  Administered 2014-11-07: 60 mg via SUBCUTANEOUS

## 2014-11-07 NOTE — Assessment & Plan Note (Signed)
Follows with endo (kerr) for same - on Lantus and SSI uncontrolled by history - but reports improving Check labs and review standards of care for diabetic patient with daughter today Lab Results  Component Value Date   HGBA1C 8.7* 01/23/2014

## 2014-11-07 NOTE — Assessment & Plan Note (Signed)
Chronic issue: complicated by tachy-brady on beta-blocker so atenolol stopped 07/2014 Previously offered but declined PPM -  ongoing follow up with cards - last OV reviewed chronic anticoag as ongoing

## 2014-11-07 NOTE — Patient Instructions (Addendum)
It was good to see you today.  We have reviewed your prior records including labs and tests today  Health Maintenance reviewed - all recommended immunizations and age-appropriate screenings are up-to-date.  Test(s) ordered today. Your results will be released to [facility] (or called to you) after review, usually within 72hours after test completion. If any changes need to be made, you will be notified at that same time.  Medications reviewed and updated, no changes recommended at this time.  Please schedule followup in 6 months for semiannual exam and labs, call sooner if problems.  Diabetes and Standards of Medical Care Diabetes is complicated. You may find that your diabetes team includes a dietitian, nurse, diabetes educator, eye doctor, and more. To help everyone know what is going on and to help you get the care you deserve, the following schedule of care was developed to help keep you on track. Below are the tests, exams, vaccines, medicines, education, and plans you will need. HbA1c test This test shows how well you have controlled your glucose over the past 2-3 months. It is used to see if your diabetes management plan needs to be adjusted.   It is performed at least 2 times a year if you are meeting treatment goals.  It is performed 4 times a year if therapy has changed or if you are not meeting treatment goals. Blood pressure test  This test is performed at every routine medical visit. The goal is less than 140/90 mm Hg for most people, but 130/80 mm Hg in some cases. Ask your health care provider about your goal. Dental exam  Follow up with the dentist regularly. Eye exam  If you are diagnosed with type 1 diabetes as a child, get an exam upon reaching the age of 70 years or older and have had diabetes for 3-5 years. Yearly eye exams are recommended after that initial eye exam.  If you are diagnosed with type 1 diabetes as an adult, get an exam within 5 years of diagnosis and  then yearly.  If you are diagnosed with type 2 diabetes, get an exam as soon as possible after the diagnosis and then yearly. Foot care exam  Visual foot exams are performed at every routine medical visit. The exams check for cuts, injuries, or other problems with the feet.  A comprehensive foot exam should be done yearly. This includes visual inspection as well as assessing foot pulses and testing for loss of sensation.  Check your feet nightly for cuts, injuries, or other problems with your feet. Tell your health care provider if anything is not healing. Kidney function test (urine microalbumin)  This test is performed once a year.  Type 1 diabetes: The first test is performed 5 years after diagnosis.  Type 2 diabetes: The first test is performed at the time of diagnosis.  A serum creatinine and estimated glomerular filtration rate (eGFR) test is done once a year to assess the level of chronic kidney disease (CKD), if present. Lipid profile (cholesterol, HDL, LDL, triglycerides)  Performed every 5 years for most people.  The goal for LDL is less than 100 mg/dL. If you are at high risk, the goal is less than 70 mg/dL.  The goal for HDL is 40 mg/dL-50 mg/dL for men and 50 mg/dL-60 mg/dL for women. An HDL cholesterol of 60 mg/dL or higher gives some protection against heart disease.  The goal for triglycerides is less than 150 mg/dL. Influenza vaccine, pneumococcal vaccine, and hepatitis B vaccine  The influenza vaccine is recommended yearly.  It is recommended that people with diabetes who are over 70 years old get the pneumonia vaccine. In some cases, two separate shots may be given. Ask your health care provider if your pneumonia vaccination is up to date.  The hepatitis B vaccine is also recommended for adults with diabetes. Diabetes self-management education  Education is recommended at diagnosis and ongoing as needed. Treatment plan  Your treatment plan is reviewed at  every medical visit. Document Released: 05/24/2009 Document Revised: 12/11/2013 Document Reviewed: 12/27/2012 [facility] Patient Information 2015 [place], [place]. This information is not intended to replace advice given to you by your health care provider. Make sure you discuss any questions you have with your health care provider.

## 2014-11-07 NOTE — Assessment & Plan Note (Signed)
BP Readings from Last 3 Encounters:  11/07/14 118/68  10/01/14 154/75  09/17/14 114/87   The current medical regimen is effective;  continue present plan and medications.

## 2014-11-07 NOTE — Progress Notes (Signed)
Subjective:    Patient ID: Wendy Hopkins, female    DOB: April 30, 1926, 81 y.o.   MRN: [id number]  HPI   Here for medicare wellness  Diet: heart healthy, diabetic Physical activity: sedentary Depression/mood screen: negative Hearing: intact to whispered voice Visual acuity: grossly normal, performs annual eye exam  ADLs: capable Fall risk: none Home safety: good Cognitive evaluation: intact to orientation, naming, recall and repetition EOL planning: adv directives  I have personally reviewed and have noted 1. The patient's medical and social history 2. Their use of alcohol, tobacco or illicit drugs 3. Their current medications and supplements 4. The patient's functional ability including ADL's, fall risks, home safety risks and hearing or visual impairment. 5. Diet and physical activities 6. Evidence for depression or mood disorders  Reviewed chronic conditions, interval events and current concerns  Past Medical History  Diagnosis Date  . Arthritis   . Hyperlipidemia   . 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 Closed fracture of unspecified part of femur 2008  . TIA (transient ischemic attack)   . Edema   . Chronic diastolic heart failure   . Osteoporosis with fracture     compression fx lower T spine and t4, L rib  . Thyroid nodule     dominate R nodule, s/p aspiration>abn path>6 mo obs planned ([facility] 06/01/11)  . GERD (gastroesophageal reflux disease)   . Hypertension 2006  . Atrial fibrillation     chronic anticoag; and AFlutter  . History of radiation therapy 11/30/2012-12/09/2012    52 gray to right chest  . Diabetes mellitus, type II, insulin dependent     over 40 years ago  . Vertebral compression fracture     Thoracic 12 and Thoracic 7  . Adenocarcinoma, lung dx 11/08/12    s/p CT chest bx - XRT thru 12/2012   Family History  Problem Relation Age of Onset  . Hypertension Father     Parent not sure which 1  . Diabetes Brother     . Lung cancer Other   . Cancer Sister   . Parkinsonism Sister    History  Substance Use Topics  . Smoking status: Never Smoker   . Smokeless tobacco: Never Used  . Alcohol Use: No    Review of Systems  Constitutional: Positive for fatigue. Negative for unexpected weight change.  Respiratory: Positive for shortness of breath (chronic DOE, unchanged). Negative for cough and wheezing.   Cardiovascular: Negative for chest pain, palpitations and leg swelling.  Gastrointestinal: Negative for nausea, abdominal pain and diarrhea.  Neurological: Negative for dizziness, weakness, light-headedness and headaches.  Psychiatric/Behavioral: Negative for dysphoric mood. The patient is not nervous/anxious.   All other systems reviewed and are negative.      Objective:    Physical Exam  Constitutional: She appears well-developed and well-nourished. No distress.  Cardiovascular: Normal rate and normal heart sounds.   No murmur heard. irreg irreg  Pulmonary/Chest: Effort normal and breath sounds normal. No respiratory distress.  Musculoskeletal: She exhibits no edema.    BP 118/68 mmHg  Pulse 84  Temp(Src) 97.5 F (36.4 C) (Oral)  Ht 5' (1.524 m)  Wt 101 lb (45.813 kg)  BMI 19.73 kg/m2  SpO2 98% Wt Readings from Last 3 Encounters:  11/07/14 101 lb (45.813 kg)  09/17/14 100 lb (45.36 kg)  07/11/14 102 lb (46.267 kg)     Lab Results  Component Value Date   WBC 5.9 06/19/2014   HGB 13.7  06/19/2014   HCT 41.8 06/19/2014   PLT 231.0 06/19/2014   GLUCOSE 271* 06/19/2014   CHOL 105 06/22/2013   TRIG 63.0 06/22/2013   HDL 51.40 06/22/2013   LDLCALC 41 06/22/2013   ALT 12 11/08/2013   AST 18 11/08/2013   NA 131* 06/19/2014   K 4.9 06/19/2014   CL 92* 06/19/2014   CREATININE 1.1 08/27/2014   BUN 19.8 08/27/2014   CO2 30 06/19/2014   TSH 2.52 06/19/2014   INR 1.6 10/17/2014   HGBA1C 8.7* 01/23/2014    Ct Chest W Contrast  08/27/2014   CLINICAL DATA:  81 year old female  with history of lung cancer diagnosed in January status post radiation therapy which is now complete (completed in 2014). Cough.  EXAM: CT CHEST WITH CONTRAST  TECHNIQUE: Multidetector CT imaging of the chest was performed during intravenous contrast administration.  CONTRAST:  83mL OMNIPAQUE IOHEXOL 300 MG/ML  SOLN  COMPARISON:  Chest CT 04/24/2014.  FINDINGS: Mediastinum/Lymph Nodes: Heart size is enlarged with biatrial dilatation. There is no significant pericardial fluid, thickening or pericardial calcification. There is atherosclerosis of the thoracic aorta, the great vessels of the mediastinum and the coronary arteries, including calcified atherosclerotic plaque in the left main and right coronary arteries. No pathologically enlarged mediastinal, hilar or axillary lymph nodes. Esophagus is unremarkable in appearance.  Lungs/Pleura: Again noted is a chronic mass like opacity in the right lower lobe, with significant chronic volume loss. This area appears largely unchanged compared to the prior examination, although there is increasing nodularity in the basal segments of the right lower lobe inferior to this area of masslike consolidation, with the largest nodule in these regions measuring up to 8 mm on today's examination (image 31 of series 4), increased from 5 mm on the prior study. No other suspicious appearing pulmonary nodules or masses are noted elsewhere in the lungs. No acute consolidative airspace disease. No pleural effusions.  Musculoskeletal/Soft Tissues: Multiple old vertebral body compression fractures at T3, T6, T7, and T8 are again noted, in addition to an old burst fracture at T12. These appear essentially unchanged, and are most severe at T3, T6, T7 and T8, where there is approximately 80% loss of anterior vertebral body height at all levels. Post vertebroplasty changes are noted at T7 and T12. 8 mm of retropulsion of posterior fracture fragments at T12 is unchanged, again narrowing the  central spinal canal. Old healed displaced (approximately 1 shaft width anteriorly) and mildly angulated mid sternal fracture again noted. Multiple old healed bilateral rib fractures. There are no aggressive appearing lytic or blastic lesions noted in the visualized portions of the skeleton.  Upper Abdomen: Calcified granulomas in the liver. Small splenule posterior to the spleen.  IMPRESSION: 1. While the area of postradiation mass-like fibrosis in the right lower lobe is largely stable in size and appearance compared to the prior examination, the nodularity inferior to this in the basal segments of the right lower lobe has significantly increased in both the number and size of nodules. While this could simply be reactive postobstructive changes and mucoid impaction, the possibility of recurrence of disease with some local lymphangitic spread of tumor is not excluded, and consideration for further evaluation with PET-CT is suggested at this time if clinically appropriate. 2. Mild cardiomegaly with biatrial dilatation. 3. Atherosclerosis, including left main and right coronary artery disease. 4. Additional findings, as above, similar prior studies.   Electronically Signed   By: Vinnie Langton M.D.   On: 08/27/2014 14:17  Assessment & Plan:   Z00.00/AWV - Today patient counseled on age appropriate routine health concerns for screening and prevention, each reviewed and up to date or declined. Immunizations reviewed and up to date or declined. Labs ordered and reviewed. Risk factors for depression reviewed and negative. Hearing function and visual acuity are intact. ADLs screened and addressed as needed. Functional ability and level of safety reviewed and appropriate. Education, counseling and referrals performed based on assessed risks today. Patient provided with a copy of personalized plan for preventive services.  Problem List Items Addressed This Visit    ATRIAL FIBRILLATION    Chronic issue:  complicated by tachy-brady on beta-blocker so atenolol stopped 07/2014 Previously offered but declined PPM -  ongoing follow up with cards - last OV reviewed chronic anticoag as ongoing      Relevant Medications   atorvastatin (LIPITOR) 10 MG tablet   furosemide (LASIX) 40 MG tablet   spironolactone (ALDACTONE) 25 MG tablet   warfarin (COUMADIN) 5 MG tablet   Diabetes mellitus type 2, insulin dependent    Follows with endo (kerr) for same - on Lantus and SSI uncontrolled by history - but reports improving Check labs and review standards of care for diabetic patient with daughter today Lab Results  Component Value Date   HGBA1C 8.7* 01/23/2014        Relevant Medications   atorvastatin (LIPITOR) 10 MG tablet   LANTUS 100 UNIT/ML injection   Other Relevant Orders   Hemoglobin A1c   Lipid panel   Essential hypertension    BP Readings from Last 3 Encounters:  11/07/14 118/68  10/01/14 154/75  09/17/14 114/87   The current medical regimen is effective;  continue present plan and medications.       Relevant Medications   atorvastatin (LIPITOR) 10 MG tablet   furosemide (LASIX) 40 MG tablet   spironolactone (ALDACTONE) 25 MG tablet   warfarin (COUMADIN) 5 MG tablet    Other Visit Diagnoses    Routine general medical examination at a health care facility    -  Primary    Need for prophylactic vaccination with tetanus toxoid alone            [name], MD

## 2014-11-07 NOTE — Addendum Note (Signed)
Addended by: Lowella Dandy on: 11/07/2014 10:11 AM   Modules accepted: Orders, Medications, SmartSet

## 2014-11-07 NOTE — Progress Notes (Signed)
Pre visit review using our clinic review tool, if applicable. No additional management support is needed unless otherwise documented below in the visit note. 

## 2014-11-09 ENCOUNTER — Telehealth: Payer: Self-pay

## 2014-11-09 NOTE — Telephone Encounter (Signed)
-----   Message from Rowe Clack, MD sent at 11/08/2014  8:11 AM EDT ----- Please call patient/daughter. Cholesterol is well controlled with LDL of 75. Total cholesterol 151. Both of these are at goal. No treatment changes recommended. Please call if questions or concerns. Thanks

## 2014-11-09 NOTE — Telephone Encounter (Signed)
Talked with melissa/pt daughter and gave lab results and dr leschber's comments

## 2014-11-14 ENCOUNTER — Ambulatory Visit (INDEPENDENT_AMBULATORY_CARE_PROVIDER_SITE_OTHER): Payer: Medicare Other | Admitting: General Practice

## 2014-11-14 DIAGNOSIS — I4891 Unspecified atrial fibrillation: Secondary | ICD-10-CM | POA: Diagnosis not present

## 2014-11-14 DIAGNOSIS — Z5181 Encounter for therapeutic drug level monitoring: Secondary | ICD-10-CM

## 2014-11-14 DIAGNOSIS — Z7901 Long term (current) use of anticoagulants: Secondary | ICD-10-CM | POA: Diagnosis not present

## 2014-11-14 LAB — POCT INR: INR: 2

## 2014-11-14 NOTE — Progress Notes (Signed)
Pre visit review using our clinic review tool, if applicable. No additional management support is needed unless otherwise documented below in the visit note. 

## 2014-11-14 NOTE — Progress Notes (Signed)
Agree with plan 

## 2014-11-30 ENCOUNTER — Ambulatory Visit (HOSPITAL_COMMUNITY)
Admission: RE | Admit: 2014-11-30 | Discharge: 2014-11-30 | Disposition: A | Discharge status: Home / Self Care | Payer: Medicare Other | Source: Ambulatory Visit | Attending: Radiation Oncology | Admitting: Radiation Oncology

## 2014-11-30 ENCOUNTER — Encounter (HOSPITAL_COMMUNITY): Payer: Self-pay

## 2014-11-30 ENCOUNTER — Ambulatory Visit
Admission: RE | Admit: 2014-11-30 | Discharge: 2014-11-30 | Disposition: A | Discharge status: Home / Self Care | Payer: Medicare Other | Source: Ambulatory Visit | Attending: Radiation Oncology | Admitting: Radiation Oncology

## 2014-11-30 DIAGNOSIS — Z9221 Personal history of antineoplastic chemotherapy: Secondary | ICD-10-CM | POA: Diagnosis not present

## 2014-11-30 DIAGNOSIS — Z923 Personal history of irradiation: Secondary | ICD-10-CM | POA: Diagnosis not present

## 2014-11-30 DIAGNOSIS — C3430 Malignant neoplasm of lower lobe, unspecified bronchus or lung: Secondary | ICD-10-CM | POA: Diagnosis not present

## 2014-11-30 DIAGNOSIS — J8410 Pulmonary fibrosis, unspecified: Secondary | ICD-10-CM | POA: Diagnosis not present

## 2014-11-30 DIAGNOSIS — I517 Cardiomegaly: Secondary | ICD-10-CM | POA: Diagnosis not present

## 2014-11-30 DIAGNOSIS — I2510 Atherosclerotic heart disease of native coronary artery without angina pectoris: Secondary | ICD-10-CM | POA: Diagnosis not present

## 2014-11-30 LAB — BUN AND CREATININE (CC13)
BUN: 16.2 mg/dL (ref 7.0–26.0)
CREATININE: 1.3 mg/dL — AB (ref 0.6–1.1)
EGFR: 38 mL/min/{1.73_m2} — AB (ref >90)

## 2014-11-30 MED ORDER — IOHEXOL 300 MG/ML  SOLN
50.0000 mL | Freq: Once | INTRAMUSCULAR | Status: AC | PRN
  Administered 2014-11-30: 50 mL via INTRAVENOUS

## 2014-12-04 DIAGNOSIS — K3184 Gastroparesis: Secondary | ICD-10-CM | POA: Diagnosis not present

## 2014-12-04 DIAGNOSIS — M810 Age-related osteoporosis without current pathological fracture: Secondary | ICD-10-CM | POA: Diagnosis not present

## 2014-12-04 DIAGNOSIS — E042 Nontoxic multinodular goiter: Secondary | ICD-10-CM | POA: Diagnosis not present

## 2014-12-04 DIAGNOSIS — E1040 Type 1 diabetes mellitus with diabetic neuropathy, unspecified: Secondary | ICD-10-CM | POA: Diagnosis not present

## 2014-12-04 LAB — CBC AND DIFFERENTIAL
HCT: 37 % (ref 36–46)
Hemoglobin: 11.9 g/dL — AB (ref 12.0–16.0)
PLATELETS: 169 10*3/uL (ref 150–399)
WBC: 6.3 10*3/mL

## 2014-12-06 ENCOUNTER — Ambulatory Visit: Admission: RE | Admit: 2014-12-06 | Payer: Medicare Other | Source: Ambulatory Visit | Admitting: Radiation Oncology

## 2014-12-12 ENCOUNTER — Ambulatory Visit
Admission: RE | Admit: 2014-12-12 | Discharge: 2014-12-12 | Disposition: A | Discharge status: Home / Self Care | Payer: Medicare Other | Source: Ambulatory Visit | Attending: Radiation Oncology | Admitting: Radiation Oncology

## 2014-12-12 ENCOUNTER — Ambulatory Visit (INDEPENDENT_AMBULATORY_CARE_PROVIDER_SITE_OTHER): Payer: Medicare Other | Admitting: General Practice

## 2014-12-12 DIAGNOSIS — I4891 Unspecified atrial fibrillation: Secondary | ICD-10-CM | POA: Diagnosis not present

## 2014-12-12 DIAGNOSIS — Z5181 Encounter for therapeutic drug level monitoring: Secondary | ICD-10-CM | POA: Diagnosis not present

## 2014-12-12 DIAGNOSIS — Z7901 Long term (current) use of anticoagulants: Secondary | ICD-10-CM | POA: Diagnosis not present

## 2014-12-12 LAB — POCT INR: INR: 1.6

## 2014-12-12 NOTE — Progress Notes (Signed)
Agree with plan 

## 2014-12-12 NOTE — Progress Notes (Signed)
Pre visit review using our clinic review tool, if applicable. No additional management support is needed unless otherwise documented below in the visit note. 

## 2014-12-17 ENCOUNTER — Ambulatory Visit
Admission: RE | Admit: 2014-12-17 | Discharge: 2014-12-17 | Disposition: A | Discharge status: Home / Self Care | Payer: Medicare Other | Source: Ambulatory Visit | Attending: Radiation Oncology | Admitting: Radiation Oncology

## 2014-12-17 ENCOUNTER — Encounter: Payer: Self-pay | Admitting: Radiation Oncology

## 2014-12-17 VITALS — BP 140/61 | HR 92 | Temp 97.6°F | Resp 20 | Ht 61.0 in | Wt 100.2 lb

## 2014-12-17 DIAGNOSIS — C3431 Malignant neoplasm of lower lobe, right bronchus or lung: Secondary | ICD-10-CM | POA: Diagnosis not present

## 2014-12-17 NOTE — Progress Notes (Signed)
Radiation Oncology         [phone] ________________________________  Name: Wendy Hopkins MRN: [id number]  Date: 12/17/2014  DOB: 1925-09-01  Follow-Up Visit Note  CC: Wendy Grant, MD  [name], MD  Diagnosis: Non-small cell lung cancer involving the right lower lobe   No diagnosis found.  Interval Since Last Radiation: 12/09/2012   Narrative:  The patient returns today for routine follow-up. Follow up s/p rad txs RLL 11/30/12-12/09/12, had recent CT Chest on 11/30/14. Pt states that her appetite is poor, but ate well yesterday and this morning so far. Pt experiences sob when walking and her right, upper, rib area was sore since Saturday. It only hurts when she reaches with her right arm. Pt states her energy level is poor. She also states that her stomach hurts sometimes. The pt has no other complaints at this time.  ALLERGIES:  is allergic to codeine.  Meds: Current Outpatient Prescriptions  Medication Sig Dispense Refill  . ACCU-CHEK SMARTVIEW test strip Use test strips to check blood sugar at least 3 times a day.  4  . acetaminophen (TYLENOL) 500 MG tablet Take 500 mg by mouth every 6 (six) hours as needed.    Marland Kitchen atorvastatin (LIPITOR) 10 MG tablet 10 mg daily. 1 tab po daily  5  . cholecalciferol (VITAMIN D) 1000 UNITS tablet Take 1,000 Units by mouth 2 (two) times daily.    . furosemide (LASIX) 40 MG tablet 40 mg daily. 1 tab by mouth daily    . Insulin Syringe-Needle U-100 30G X 5/16" 1 ML MISC by Does not apply route.    Marland Kitchen LANTUS 100 UNIT/ML injection 6 Units daily. Inject 6 units daily  6  . spironolactone (ALDACTONE) 25 MG tablet 25 mg daily.    Marland Kitchen warfarin (COUMADIN) 5 MG tablet 5 mg. Take as instructed by the anticoagulation clinic  3   No current facility-administered medications for this encounter.    Physical Findings: The patient is in no acute distress. Patient is alert and oriented.  height is '5\' 1"'$  (1.549 m) and weight is 100 lb 3.2 oz (45.45  kg). Her oral temperature is 97.6 F (36.4 C). Her blood pressure is 140/61 and her pulse is 92. Her respiration is 20 and oxygen saturation is 98%.   Lungs are clear to auscultation bilaterally, except for some decreased sounds in the lower right lung.  Lab Findings: Lab Results  Component Value Date   WBC 5.9 06/19/2014   HGB 13.7 06/19/2014   HCT 41.8 06/19/2014   PLT 231.0 06/19/2014    Lab Results  Component Value Date   NA 131* 06/19/2014   NA 130* 07/17/2013   K 4.9 06/19/2014   CO2 30 06/19/2014   GLUCOSE 271* 06/19/2014   BUN 16.2 11/30/2014   BUN 11 06/19/2014   BUN 15 07/17/2013   CREATININE 1.3* 11/30/2014   CREATININE 1.0 06/19/2014   CREATININE 0.8 07/17/2013   BILITOT 0.8 11/08/2013   ALKPHOS 105 11/08/2013   AST 18 11/08/2013   ALT 12 11/08/2013   PROT 6.7 11/08/2013   ALBUMIN 3.7 11/08/2013   CALCIUM 9.1 06/19/2014    Radiographic Findings: Ct Chest W Contrast  11/30/2014   CLINICAL DATA:  History of lung cancer status post chemotherapy and XRT.  EXAM: CT CHEST WITH CONTRAST  TECHNIQUE: Multidetector CT imaging of the chest was performed during intravenous contrast administration.  CONTRAST:  68m OMNIPAQUE IOHEXOL 300 MG/ML  SOLN  COMPARISON:  08/27/2014  FINDINGS: Mediastinum: . There is mild cardiac enlargement. Calcified atherosclerotic disease involves the thoracic aorta as well as the left main and RCA. No mediastinal adenopathy identified. The trachea appears patent. Normal appearance of the esophagus.  Lungs/Pleura: Small stress that there is a new small right pleural effusion. Mass like opacity involving the right lower lobe is again identified. On the transverse images this measures 5.4 x 3.8 cm, image 24/ series 6. Previously this measured 5.4 x 3.9 cm. There has been interval postobstructive consolidation involving the entire right lower lobe. Opacification of the right lower lobe bronchi noted, image number 28/ series 6. This is new from the previous  exama the right upper lobe and right middle lobe appear clear. No opacities identified within the left lung.  Upper Abdomen: Granulomas identified within the liver. Normal appearance of the adrenal glands. The spleen is unremarkable.  Musculoskeletal: Review of the visualized osseous structures is negative for aggressive lytic or sclerotic bone lesion. The bones appear osteopenic and there are multiple compression deformities throughout the thoracic spine. Several of these have been treated with bone cement. Resultant marked kyphosis deformity of the thoracic spine is identified. Healed sternal fracture is again noted. No aggressive lytic or sclerotic bone lesions identified.  IMPRESSION: 1. Again noted is an area of post radiation masslike fibrosis in the right lower lobe. This is unchanged in size from previous exam. However, there is new postobstructive consolidation of the entire right lower lobe and a new right pleural effusion. 2. Mild cardiac enlargement. 3. Atherosclerotic disease including left main and RCA Coronary artery calcification.   Electronically Signed   By: Kerby Moors M.D.   On: 11/30/2014 13:44    Impression:  The patient clinically has been stable. We had discussed previous findings within the right lung and she has been reluctant to proceed with workup of these findings in the past. However after discussing the findings once again today, the patient indicated that she did wish to proceed with a PET scan for further workup.  Plan:  Schedule a PET scan within 1-2 weeks. The pt also discussed seeing Dr. Melvyn Novas after her PET scan as well which I believe would be very reasonable.  I spent 15 minutes with the patient today, the majority of which was spent counseling the patient on the diagnosis of cancer and coordinating care.  This document serves as a record of services personally performed by Kyung Rudd, MD. It was created on his behalf by Darcus Austin, a trained medical scribe. The  creation of this record is based on the scribe's personal observations and the provider's statements to them. This document has been checked and approved by the attending provider.     Jodelle Gross, M.D., Ph.D.

## 2014-12-17 NOTE — Progress Notes (Signed)
Follow up s/p rad txs RLL 11/30/12-12/09/12, had recent CT Chest 11/30/14, room air 98% oxygen sats, appetite stated poor but ate well yesterday and this am so far, c/o sob when walking, and right rib area sore  Since Saturday , energy level poor stated,  10:12 AM 10:12 AM BP 140/61 mmHg  Pulse 92  Temp(Src) 97.6 F (36.4 C) (Oral)  Resp 20  Ht '5\' 1"'$  (1.549 m)  Wt 100 lb 3.2 oz (45.45 kg)  BMI 18.94 kg/m2  SpO2 98%  Wt Readings from Last 3 Encounters:  11/07/14 101 lb (45.813 kg)  09/17/14 100 lb (45.36 kg)  07/11/14 102 lb (46.267 kg)

## 2014-12-18 ENCOUNTER — Telehealth: Payer: Self-pay | Admitting: *Deleted

## 2014-12-18 ENCOUNTER — Encounter: Payer: Self-pay | Admitting: Radiation Oncology

## 2014-12-18 ENCOUNTER — Encounter: Payer: Self-pay | Admitting: Internal Medicine

## 2014-12-18 NOTE — Telephone Encounter (Signed)
CALLED PATIENT'S DAUGHTER, [name] LEONARD PER PATIENT REQUEST TO INFORM OF SCAN AND FU,LVM FOR A RETURN CALL

## 2014-12-25 ENCOUNTER — Ambulatory Visit (INDEPENDENT_AMBULATORY_CARE_PROVIDER_SITE_OTHER): Payer: Medicare Other | Admitting: General Practice

## 2014-12-25 DIAGNOSIS — Z7901 Long term (current) use of anticoagulants: Secondary | ICD-10-CM | POA: Diagnosis not present

## 2014-12-25 DIAGNOSIS — I4891 Unspecified atrial fibrillation: Secondary | ICD-10-CM

## 2014-12-25 DIAGNOSIS — Z5181 Encounter for therapeutic drug level monitoring: Secondary | ICD-10-CM | POA: Diagnosis not present

## 2014-12-25 LAB — POCT INR: INR: 1.8

## 2014-12-25 NOTE — Progress Notes (Signed)
Pre visit review using our clinic review tool, if applicable. No additional management support is needed unless otherwise documented below in the visit note. 

## 2014-12-25 NOTE — Progress Notes (Signed)
Agree with plan 

## 2014-12-27 ENCOUNTER — Ambulatory Visit (HOSPITAL_COMMUNITY): Payer: Medicare Other

## 2014-12-28 ENCOUNTER — Ambulatory Visit: Payer: Medicare Other | Admitting: Radiation Oncology

## 2014-12-31 ENCOUNTER — Other Ambulatory Visit: Payer: Self-pay | Admitting: Internal Medicine

## 2015-01-01 NOTE — Telephone Encounter (Signed)
Per note 12.2.15

## 2015-01-02 ENCOUNTER — Ambulatory Visit: Payer: Medicare Other

## 2015-01-03 ENCOUNTER — Ambulatory Visit (HOSPITAL_COMMUNITY)
Admission: RE | Admit: 2015-01-03 | Discharge: 2015-01-03 | Disposition: A | Discharge status: Home / Self Care | Payer: Medicare Other | Source: Ambulatory Visit | Attending: Radiation Oncology | Admitting: Radiation Oncology

## 2015-01-03 ENCOUNTER — Encounter (HOSPITAL_COMMUNITY): Payer: Self-pay

## 2015-01-03 DIAGNOSIS — C3491 Malignant neoplasm of unspecified part of right bronchus or lung: Secondary | ICD-10-CM | POA: Diagnosis not present

## 2015-01-03 DIAGNOSIS — C3431 Malignant neoplasm of lower lobe, right bronchus or lung: Secondary | ICD-10-CM

## 2015-01-03 LAB — GLUCOSE, CAPILLARY: Glucose-Capillary: 261 mg/dL — ABNORMAL HIGH (ref 65–99)

## 2015-01-03 MED ORDER — FLUDEOXYGLUCOSE F - 18 (FDG) INJECTION
4.9600 | Freq: Once | INTRAVENOUS | Status: AC | PRN
  Administered 2015-01-03: 4.96 via INTRAVENOUS

## 2015-01-10 ENCOUNTER — Ambulatory Visit: Admission: RE | Admit: 2015-01-10 | Payer: Medicare Other | Source: Ambulatory Visit | Admitting: Radiation Oncology

## 2015-01-11 ENCOUNTER — Ambulatory Visit
Admission: RE | Admit: 2015-01-11 | Discharge: 2015-01-11 | Disposition: A | Discharge status: Home / Self Care | Payer: Medicare Other | Source: Ambulatory Visit | Attending: Radiation Oncology | Admitting: Radiation Oncology

## 2015-01-11 ENCOUNTER — Encounter: Payer: Self-pay | Admitting: Radiation Oncology

## 2015-01-11 VITALS — BP 129/64 | HR 86 | Temp 97.9°F | Resp 20 | Ht 61.0 in | Wt 99.1 lb

## 2015-01-11 DIAGNOSIS — C3431 Malignant neoplasm of lower lobe, right bronchus or lung: Secondary | ICD-10-CM

## 2015-01-11 NOTE — Progress Notes (Signed)
Radiation Oncology         [phone]) [phone] ________________________________  Name: Wendy Hopkins MRN: [id number]  Date: 01/11/2015  DOB: [birth year]  Follow-Up Visit Note  CC: [name], MD  [name], MD  Diagnosis:   Lung cancer  Interval Since Last Radiation:  The patient completed radiation treatment on 12/09/2012 to the right lower lobe   Narrative:  The patient returns today for routine follow-up.  She underwent a PET scan recently and she comes in to discuss the results of this. No clinical changes since she was last seen.                              ALLERGIES:  is allergic to codeine.  Meds: Current Outpatient Prescriptions  Medication Sig Dispense Refill  . ACCU-CHEK SMARTVIEW test strip Use test strips to check blood sugar at least 3 times a day.  4  . atorvastatin (LIPITOR) 10 MG tablet 10 mg daily. 1 tab po daily  5  . cholecalciferol (VITAMIN D) 1000 UNITS tablet Take 1,000 Units by mouth daily.     . furosemide (LASIX) 40 MG tablet TAKE 2 TABLETS BY MOUTH EVERY DAY 60 tablet 7  . insulin lispro (HUMALOG) 100 UNIT/ML injection Inject 2 Units into the skin 3 (three) times daily with meals.    . Insulin Syringe-Needle U-100 30G X 5/16" 1 ML MISC by Does not apply route.    Marland Kitchen LANTUS 100 UNIT/ML injection 6 Units daily. Inject 6 units daily  6  . acetaminophen (TYLENOL) 500 MG tablet Take 500 mg by mouth every 6 (six) hours as needed.    Marland Kitchen spironolactone (ALDACTONE) 25 MG tablet 25 mg daily.    Marland Kitchen warfarin (COUMADIN) 5 MG tablet 5 mg. Take as instructed by the anticoagulation clinic  3   No current facility-administered medications for this encounter.    Physical Findings: The patient is in no acute distress. Patient is alert and oriented.  height is '5\' 1"'$  (1.549 m) and weight is 99 lb 1.6 oz (44.951 kg). Her oral temperature is 97.9 F (36.6 C). Her blood pressure is 129/64 and her pulse is 86. Her respiration is 20 and oxygen saturation is 100%. .      Lab Findings: Lab Results  Component Value Date   WBC 6.3 12/03/2014   HGB 11.9* 12/03/2014   HCT 37 12/03/2014   MCV 90.4 06/19/2014   PLT 169 12/03/2014     Radiographic Findings: Nm Pet Image Restag (ps) Skull Base To Thigh  01/03/2015   CLINICAL DATA:  Subsequent treatment strategy for right lung cancer post radiation therapy.  EXAM: NUCLEAR MEDICINE PET SKULL BASE TO THIGH  TECHNIQUE: 4.96 mCi F-18 FDG was injected intravenously. Full-ring PET imaging was performed from the skull base to thigh after the radiotracer. CT data was obtained and used for attenuation correction and anatomic localization.  FASTING BLOOD GLUCOSE:  Value: 261 mg/dl  COMPARISON:  PET-CT 10/18/2012 and 05/01/2013.  Chest CT 11/30/2014.  FINDINGS: NECK  No hypermetabolic cervical lymph nodes are identified.There are no lesions of the pharyngeal mucosal space. Diffuse carotid calcifications noted. There is stable mild nodularity within the right thyroid lobe without abnormal metabolic activity.  CHEST  There are no hypermetabolic mediastinal, hilar or axillary lymph nodes. Again demonstrated is complete right lower lobe collapse. There is extensive calcification within the tracheobronchial tree. There is no abnormal metabolic activity within the collapsed lobe  or right perihilar region. The aerated lungs demonstrate no suspicious nodules. Mild emphysematous changes are noted.  ABDOMEN/PELVIS  There is no hypermetabolic activity within the liver, adrenal glands, spleen or pancreas. There is no hypermetabolic nodal activity. Right renal cyst and moderate diffuse atherosclerosis noted.  SKELETON  There is no hypermetabolic activity to suggest osseous metastatic disease. Again noted is a severe thoracic kyphosis secondary to multiple compression deformities. Spinal augmentation has been performed at several levels.  IMPRESSION: 1. There is no abnormal metabolic activity associated with collapsed right lower lobe to suggest  recurrent tumor. Therefore, this likely represents chronic fibrosis. 2. No evidence of metastatic disease. 3. Diffuse atherosclerosis. 4. Severe thoracic kyphosis secondary to multiple compression deformities.   Electronically Signed   By: Richardean Sale M.D.   On: 01/03/2015 11:36    Impression:    The patient's PET scan looked very good without any suspicious activity within the right lung. I discussed this in detail with the patient and have personally reviewed this scan. All of their questions were answered.  Plan:  The patient will follow-up with an additional CT scan of the chest in 6 months.  The patient was seen today for 15 minutes, with the majority of the time spent counseling the patient on his diagnosis of cancer and coordinating his care.    Jodelle Gross, M.D., Ph.D.

## 2015-01-11 NOTE — Progress Notes (Signed)
Follow up s/p radiation treatments RLL lung 11/30/12-12/09/12, had Pet scan 01/03/15 here for results, appetite poor and fatigued poorly, dty cough at times, no nausea, suggested glucerna for ectra calories between meals 10:47 AM BP 129/64 mmHg  Pulse 86  Temp(Src) 97.9 F (36.6 C) (Oral)  Resp 20  Ht '5\' 1"'$  (1.549 m)  Wt 99 lb 1.6 oz (44.951 kg)  BMI 18.73 kg/m2  SpO2 100%  Wt Readings from Last 3 Encounters:  01/11/15 99 lb 1.6 oz (44.951 kg)  11/07/14 101 lb (45.813 kg)  09/17/14 100 lb (45.36 kg)

## 2015-01-14 ENCOUNTER — Ambulatory Visit: Payer: Medicare Other | Admitting: Podiatry

## 2015-01-22 ENCOUNTER — Ambulatory Visit (INDEPENDENT_AMBULATORY_CARE_PROVIDER_SITE_OTHER): Payer: Medicare Other | Admitting: Podiatry

## 2015-01-22 ENCOUNTER — Encounter: Payer: Self-pay | Admitting: Podiatry

## 2015-01-22 DIAGNOSIS — B351 Tinea unguium: Secondary | ICD-10-CM

## 2015-01-22 DIAGNOSIS — M79676 Pain in unspecified toe(s): Secondary | ICD-10-CM | POA: Diagnosis not present

## 2015-01-22 NOTE — Progress Notes (Signed)
Patient ID: Wendy Hopkins, female   DOB: 27-Feb-1926, 81 y.o.   MRN: [id number]  Subjective: Patient presents today requesting debridement of painful toenails  Objective: The toenails are elongated, brittle, discolored, hypertrophic and tender to direct palpation  Assessment: Symptomatic onychomycoses 6-10 Type II diabetic  Plan: Debrided toenails 10 without any bleeding  Reappoint 3 months

## 2015-01-22 NOTE — Patient Instructions (Signed)
Diabetes and Foot Care Diabetes may cause you to have problems because of poor blood supply (circulation) to your feet and legs. This may cause the skin on your feet to become thinner, break easier, and heal more slowly. Your skin may become dry, and the skin may peel and crack. You may also have nerve damage in your legs and feet causing decreased feeling in them. You may not notice minor injuries to your feet that could lead to infections or more serious problems. Taking care of your feet is one of the most important things you can do for yourself.  HOME CARE INSTRUCTIONS  Wear shoes at all times, even in the house. Do not go barefoot. Bare feet are easily injured.  Check your feet daily for blisters, cuts, and redness. If you cannot see the bottom of your feet, use a mirror or ask someone for help.  Wash your feet with warm water (do not use hot water) and mild soap. Then pat your feet and the areas between your toes until they are completely dry. Do not soak your feet as this can dry your skin.  Apply a moisturizing lotion or petroleum jelly (that does not contain alcohol and is unscented) to the skin on your feet and to dry, brittle toenails. Do not apply lotion between your toes.  Trim your toenails straight across. Do not dig under them or around the cuticle. File the edges of your nails with an emery board or nail file.  Do not cut corns or calluses or try to remove them with medicine.  Wear clean socks or stockings every day. Make sure they are not too tight. Do not wear knee-high stockings since they may decrease blood flow to your legs.  Wear shoes that fit properly and have enough cushioning. To break in new shoes, wear them for just a few hours a day. This prevents you from injuring your feet. Always look in your shoes before you put them on to be sure there are no objects inside.  Do not cross your legs. This may decrease the blood flow to your feet.  If you find a minor scrape,  cut, or break in the skin on your feet, keep it and the skin around it clean and dry. These areas may be cleansed with mild soap and water. Do not cleanse the area with peroxide, alcohol, or iodine.  When you remove an adhesive bandage, be sure not to damage the skin around it.  If you have a wound, look at it several times a day to make sure it is healing.  Do not use heating pads or hot water bottles. They may burn your skin. If you have lost feeling in your feet or legs, you may not know it is happening until it is too late.  Make sure your health care provider performs a complete foot exam at least annually or more often if you have foot problems. Report any cuts, sores, or bruises to your health care provider immediately. SEEK MEDICAL CARE IF:   You have an injury that is not healing.  You have cuts or breaks in the skin.  You have an ingrown nail.  You notice redness on your legs or feet.  You feel burning or tingling in your legs or feet.  You have pain or cramps in your legs and feet.  Your legs or feet are numb.  Your feet always feel cold. SEEK IMMEDIATE MEDICAL CARE IF:   There is increasing redness,   swelling, or pain in or around a wound.  There is a red line that goes up your leg.  Pus is coming from a wound.  You develop a fever or as directed by your health care provider.  You notice a bad smell coming from an ulcer or wound. Document Released: 07/24/2000 Document Revised: 03/29/2013 Document Reviewed: 01/03/2013 [employer] Patient Information 2015 [employer]. This information is not intended to replace advice given to you by your health care provider. Make sure you discuss any questions you have with your health care provider.  

## 2015-01-23 ENCOUNTER — Ambulatory Visit (INDEPENDENT_AMBULATORY_CARE_PROVIDER_SITE_OTHER): Payer: Medicare Other | Admitting: *Deleted

## 2015-01-23 DIAGNOSIS — Z5181 Encounter for therapeutic drug level monitoring: Secondary | ICD-10-CM

## 2015-01-23 DIAGNOSIS — I4891 Unspecified atrial fibrillation: Secondary | ICD-10-CM | POA: Diagnosis not present

## 2015-01-23 DIAGNOSIS — Z7901 Long term (current) use of anticoagulants: Secondary | ICD-10-CM

## 2015-01-23 LAB — POCT INR: INR: 1.7

## 2015-01-23 NOTE — Progress Notes (Signed)
I have reviewed and agree with the plan. 

## 2015-01-23 NOTE — Progress Notes (Signed)
Pre visit review using our clinic review tool, if applicable. No additional management support is needed unless otherwise documented below in the visit note. 

## 2015-01-25 ENCOUNTER — Telehealth: Payer: Self-pay | Admitting: Internal Medicine

## 2015-01-25 NOTE — Telephone Encounter (Signed)
Patient needs to call billing. She has a 45.00 copay for the administration charge on the prolia vaccine. Its right. She had some other money already on her account that cancelled a portion of that copay to reflect that 28.27 balance. Theres nothing we can do for her here other than direct her to billing

## 2015-01-25 NOTE — Telephone Encounter (Signed)
$  28.27 owed.   OV on 11/07/2014  Billed for the CPE, AWV, Prolia Inj and TD vaccine. Also sent for labs.

## 2015-01-25 NOTE — Telephone Encounter (Signed)
Regarding outstanding balance

## 2015-01-25 NOTE — Telephone Encounter (Signed)
Pt informed of the below. She does not understand why she received an insulting letter.  Tried to explain it several times pt still does not understand.

## 2015-02-13 ENCOUNTER — Other Ambulatory Visit: Payer: Self-pay | Admitting: Internal Medicine

## 2015-02-13 ENCOUNTER — Other Ambulatory Visit: Payer: Self-pay | Admitting: General Practice

## 2015-02-13 MED ORDER — WARFARIN SODIUM 5 MG PO TABS: ORAL_TABLET | ORAL | Status: DC

## 2015-02-20 ENCOUNTER — Ambulatory Visit (INDEPENDENT_AMBULATORY_CARE_PROVIDER_SITE_OTHER): Payer: Medicare Other | Admitting: General Practice

## 2015-02-20 DIAGNOSIS — Z5181 Encounter for therapeutic drug level monitoring: Secondary | ICD-10-CM | POA: Diagnosis not present

## 2015-02-20 DIAGNOSIS — I4891 Unspecified atrial fibrillation: Secondary | ICD-10-CM | POA: Diagnosis not present

## 2015-02-20 DIAGNOSIS — Z7901 Long term (current) use of anticoagulants: Secondary | ICD-10-CM

## 2015-02-20 LAB — POCT INR: INR: 2

## 2015-02-20 NOTE — Progress Notes (Signed)
Pre visit review using our clinic review tool, if applicable. No additional management support is needed unless otherwise documented below in the visit note. 

## 2015-02-20 NOTE — Progress Notes (Signed)
I have reviewed and agree with the plan. 

## 2015-02-28 DIAGNOSIS — Z9849 Cataract extraction status, unspecified eye: Secondary | ICD-10-CM | POA: Diagnosis not present

## 2015-02-28 DIAGNOSIS — E11359 Type 2 diabetes mellitus with proliferative diabetic retinopathy without macular edema: Secondary | ICD-10-CM | POA: Diagnosis not present

## 2015-02-28 DIAGNOSIS — H53002 Unspecified amblyopia, left eye: Secondary | ICD-10-CM | POA: Diagnosis not present

## 2015-03-07 ENCOUNTER — Other Ambulatory Visit: Payer: Self-pay | Admitting: Internal Medicine

## 2015-03-08 ENCOUNTER — Other Ambulatory Visit: Payer: Self-pay | Admitting: Internal Medicine

## 2015-03-11 DIAGNOSIS — E1040 Type 1 diabetes mellitus with diabetic neuropathy, unspecified: Secondary | ICD-10-CM | POA: Diagnosis not present

## 2015-03-11 DIAGNOSIS — E042 Nontoxic multinodular goiter: Secondary | ICD-10-CM | POA: Diagnosis not present

## 2015-03-11 DIAGNOSIS — K3184 Gastroparesis: Secondary | ICD-10-CM | POA: Diagnosis not present

## 2015-03-11 DIAGNOSIS — M810 Age-related osteoporosis without current pathological fracture: Secondary | ICD-10-CM | POA: Diagnosis not present

## 2015-03-11 DIAGNOSIS — Z794 Long term (current) use of insulin: Secondary | ICD-10-CM | POA: Diagnosis not present

## 2015-03-18 ENCOUNTER — Telehealth: Payer: Self-pay | Admitting: Internal Medicine

## 2015-03-18 MED ORDER — SPIRONOLACTONE 25 MG PO TABS: 25.0000 mg | ORAL_TABLET | Freq: Every day | ORAL | Status: DC

## 2015-03-18 NOTE — Telephone Encounter (Signed)
Spoke with [name]. Pt needs refill on Spirolactone. Rx has been sent in. Nothing further was needed.

## 2015-03-20 ENCOUNTER — Ambulatory Visit (INDEPENDENT_AMBULATORY_CARE_PROVIDER_SITE_OTHER): Payer: Medicare Other | Admitting: General Practice

## 2015-03-20 DIAGNOSIS — Z7901 Long term (current) use of anticoagulants: Secondary | ICD-10-CM

## 2015-03-20 DIAGNOSIS — I4891 Unspecified atrial fibrillation: Secondary | ICD-10-CM

## 2015-03-20 DIAGNOSIS — Z5181 Encounter for therapeutic drug level monitoring: Secondary | ICD-10-CM | POA: Diagnosis not present

## 2015-03-20 LAB — POCT INR: INR: 2.4

## 2015-03-20 NOTE — Progress Notes (Signed)
I have reviewed and agree with the plan. 

## 2015-03-20 NOTE — Progress Notes (Signed)
Pre visit review using our clinic review tool, if applicable. No additional management support is needed unless otherwise documented below in the visit note. 

## 2015-03-28 ENCOUNTER — Telehealth: Payer: Self-pay

## 2015-03-28 DIAGNOSIS — Z0279 Encounter for issue of other medical certificate: Secondary | ICD-10-CM

## 2015-03-28 NOTE — Telephone Encounter (Signed)
DMV form was left for PCP to fill out.  PCP portion has been completed and filled out.  Pt dtr contacted (per request/note on form) that it is ready for pick up at the front.  Copy sent to EMR and Copy with charge sheet has been attached.

## 2015-04-08 DIAGNOSIS — E11359 Type 2 diabetes mellitus with proliferative diabetic retinopathy without macular edema: Secondary | ICD-10-CM | POA: Diagnosis not present

## 2015-04-08 DIAGNOSIS — Z961 Presence of intraocular lens: Secondary | ICD-10-CM | POA: Diagnosis not present

## 2015-04-10 ENCOUNTER — Other Ambulatory Visit: Payer: Self-pay | Admitting: Internal Medicine

## 2015-04-17 ENCOUNTER — Ambulatory Visit (INDEPENDENT_AMBULATORY_CARE_PROVIDER_SITE_OTHER): Payer: Medicare Other | Admitting: General Practice

## 2015-04-17 DIAGNOSIS — I4891 Unspecified atrial fibrillation: Secondary | ICD-10-CM

## 2015-04-17 DIAGNOSIS — Z5181 Encounter for therapeutic drug level monitoring: Secondary | ICD-10-CM | POA: Diagnosis not present

## 2015-04-17 DIAGNOSIS — Z7901 Long term (current) use of anticoagulants: Secondary | ICD-10-CM

## 2015-04-17 DIAGNOSIS — Z23 Encounter for immunization: Secondary | ICD-10-CM | POA: Diagnosis not present

## 2015-04-17 LAB — POCT INR: INR: 1.8

## 2015-04-17 NOTE — Progress Notes (Signed)
I have reviewed and agree with the plan. 

## 2015-04-17 NOTE — Progress Notes (Signed)
Pre visit review using our clinic review tool, if applicable. No additional management support is needed unless otherwise documented below in the visit note. 

## 2015-04-30 ENCOUNTER — Ambulatory Visit (INDEPENDENT_AMBULATORY_CARE_PROVIDER_SITE_OTHER): Payer: Medicare Other | Admitting: Podiatry

## 2015-04-30 ENCOUNTER — Encounter: Payer: Self-pay | Admitting: Podiatry

## 2015-04-30 DIAGNOSIS — M79676 Pain in unspecified toe(s): Secondary | ICD-10-CM | POA: Diagnosis not present

## 2015-04-30 DIAGNOSIS — B351 Tinea unguium: Secondary | ICD-10-CM | POA: Diagnosis not present

## 2015-04-30 NOTE — Progress Notes (Signed)
Patient ID: Wendy Hopkins, female   DOB: 12/30/1925, 81 y.o.   MRN: [id number]  Subjective: This patient presents today for scheduled visit complaining of painful toenails  Objective: The toenails are brittle, elongated, discolored, incurvated and tender to direct palpation 6-10  Assessment: Symptomatic onychomycoses 6-10 Type II diabetic  Plan: Debridement of toenails 10 mechanical he an electrically without any bleeding  Reappoint 3 months

## 2015-04-30 NOTE — Patient Instructions (Signed)
Diabetes and Foot Care Diabetes may cause you to have problems because of poor blood supply (circulation) to your feet and legs. This may cause the skin on your feet to become thinner, break easier, and heal more slowly. Your skin may become dry, and the skin may peel and crack. You may also have nerve damage in your legs and feet causing decreased feeling in them. You may not notice minor injuries to your feet that could lead to infections or more serious problems. Taking care of your feet is one of the most important things you can do for yourself.  HOME CARE INSTRUCTIONS  Wear shoes at all times, even in the house. Do not go barefoot. Bare feet are easily injured.  Check your feet daily for blisters, cuts, and redness. If you cannot see the bottom of your feet, use a mirror or ask someone for help.  Wash your feet with warm water (do not use hot water) and mild soap. Then pat your feet and the areas between your toes until they are completely dry. Do not soak your feet as this can dry your skin.  Apply a moisturizing lotion or petroleum jelly (that does not contain alcohol and is unscented) to the skin on your feet and to dry, brittle toenails. Do not apply lotion between your toes.  Trim your toenails straight across. Do not dig under them or around the cuticle. File the edges of your nails with an emery board or nail file.  Do not cut corns or calluses or try to remove them with medicine.  Wear clean socks or stockings every day. Make sure they are not too tight. Do not wear knee-high stockings since they may decrease blood flow to your legs.  Wear shoes that fit properly and have enough cushioning. To break in new shoes, wear them for just a few hours a day. This prevents you from injuring your feet. Always look in your shoes before you put them on to be sure there are no objects inside.  Do not cross your legs. This may decrease the blood flow to your feet.  If you find a minor scrape,  cut, or break in the skin on your feet, keep it and the skin around it clean and dry. These areas may be cleansed with mild soap and water. Do not cleanse the area with peroxide, alcohol, or iodine.  When you remove an adhesive bandage, be sure not to damage the skin around it.  If you have a wound, look at it several times a day to make sure it is healing.  Do not use heating pads or hot water bottles. They may burn your skin. If you have lost feeling in your feet or legs, you may not know it is happening until it is too late.  Make sure your health care provider performs a complete foot exam at least annually or more often if you have foot problems. Report any cuts, sores, or bruises to your health care provider immediately. SEEK MEDICAL CARE IF:   You have an injury that is not healing.  You have cuts or breaks in the skin.  You have an ingrown nail.  You notice redness on your legs or feet.  You feel burning or tingling in your legs or feet.  You have pain or cramps in your legs and feet.  Your legs or feet are numb.  Your feet always feel cold. SEEK IMMEDIATE MEDICAL CARE IF:   There is increasing redness,   swelling, or pain in or around a wound.  There is a red line that goes up your leg.  Pus is coming from a wound.  You develop a fever or as directed by your health care provider.  You notice a bad smell coming from an ulcer or wound. Document Released: 07/24/2000 Document Revised: 03/29/2013 Document Reviewed: 01/03/2013 [employer] Patient Information 2015 [employer]. This information is not intended to replace advice given to you by your health care provider. Make sure you discuss any questions you have with your health care provider.  

## 2015-05-03 ENCOUNTER — Telehealth: Payer: Self-pay | Admitting: Internal Medicine

## 2015-05-03 NOTE — Telephone Encounter (Signed)
Pt received a bill for $50 for filling out DMV paperwork. She claims it is Dr. Asa Lente reason she needed this filled out.

## 2015-05-13 ENCOUNTER — Encounter (HOSPITAL_COMMUNITY): Payer: Self-pay | Admitting: Emergency Medicine

## 2015-05-13 ENCOUNTER — Emergency Department (HOSPITAL_COMMUNITY): Payer: Medicare Other

## 2015-05-13 ENCOUNTER — Observation Stay (HOSPITAL_COMMUNITY)
Admission: EM | Admit: 2015-05-13 | Discharge: 2015-05-15 | Disposition: A | Discharge status: Skilled Nursing Facility | Payer: Medicare Other | Attending: Internal Medicine | Admitting: Internal Medicine

## 2015-05-13 DIAGNOSIS — I071 Rheumatic tricuspid insufficiency: Secondary | ICD-10-CM | POA: Diagnosis not present

## 2015-05-13 DIAGNOSIS — S3282XA Multiple fractures of pelvis without disruption of pelvic ring, initial encounter for closed fracture: Secondary | ICD-10-CM | POA: Diagnosis present

## 2015-05-13 DIAGNOSIS — I10 Essential (primary) hypertension: Secondary | ICD-10-CM | POA: Insufficient documentation

## 2015-05-13 DIAGNOSIS — Z923 Personal history of irradiation: Secondary | ICD-10-CM | POA: Diagnosis not present

## 2015-05-13 DIAGNOSIS — K5900 Constipation, unspecified: Secondary | ICD-10-CM | POA: Diagnosis present

## 2015-05-13 DIAGNOSIS — Z79899 Other long term (current) drug therapy: Secondary | ICD-10-CM | POA: Diagnosis not present

## 2015-05-13 DIAGNOSIS — W010XXA Fall on same level from slipping, tripping and stumbling without subsequent striking against object, initial encounter: Secondary | ICD-10-CM | POA: Insufficient documentation

## 2015-05-13 DIAGNOSIS — Z794 Long term (current) use of insulin: Secondary | ICD-10-CM | POA: Insufficient documentation

## 2015-05-13 DIAGNOSIS — S0990XA Unspecified injury of head, initial encounter: Secondary | ICD-10-CM | POA: Diagnosis not present

## 2015-05-13 DIAGNOSIS — E875 Hyperkalemia: Secondary | ICD-10-CM | POA: Insufficient documentation

## 2015-05-13 DIAGNOSIS — M545 Low back pain: Secondary | ICD-10-CM | POA: Diagnosis not present

## 2015-05-13 DIAGNOSIS — I272 Other secondary pulmonary hypertension: Secondary | ICD-10-CM | POA: Insufficient documentation

## 2015-05-13 DIAGNOSIS — Z85118 Personal history of other malignant neoplasm of bronchus and lung: Secondary | ICD-10-CM | POA: Insufficient documentation

## 2015-05-13 DIAGNOSIS — S32302A Unspecified fracture of left ilium, initial encounter for closed fracture: Secondary | ICD-10-CM | POA: Diagnosis not present

## 2015-05-13 DIAGNOSIS — E785 Hyperlipidemia, unspecified: Secondary | ICD-10-CM | POA: Diagnosis not present

## 2015-05-13 DIAGNOSIS — M4692 Unspecified inflammatory spondylopathy, cervical region: Secondary | ICD-10-CM | POA: Insufficient documentation

## 2015-05-13 DIAGNOSIS — Z7901 Long term (current) use of anticoagulants: Secondary | ICD-10-CM | POA: Diagnosis not present

## 2015-05-13 DIAGNOSIS — IMO0002 Reserved for concepts with insufficient information to code with codable children: Secondary | ICD-10-CM

## 2015-05-13 DIAGNOSIS — I5032 Chronic diastolic (congestive) heart failure: Secondary | ICD-10-CM | POA: Insufficient documentation

## 2015-05-13 DIAGNOSIS — T148 Other injury of unspecified body region: Secondary | ICD-10-CM | POA: Diagnosis not present

## 2015-05-13 DIAGNOSIS — S299XXA Unspecified injury of thorax, initial encounter: Secondary | ICD-10-CM | POA: Diagnosis not present

## 2015-05-13 DIAGNOSIS — K219 Gastro-esophageal reflux disease without esophagitis: Secondary | ICD-10-CM | POA: Insufficient documentation

## 2015-05-13 DIAGNOSIS — S32502A Unspecified fracture of left pubis, initial encounter for closed fracture: Secondary | ICD-10-CM | POA: Diagnosis not present

## 2015-05-13 DIAGNOSIS — I482 Chronic atrial fibrillation: Secondary | ICD-10-CM | POA: Insufficient documentation

## 2015-05-13 DIAGNOSIS — Y92009 Unspecified place in unspecified non-institutional (private) residence as the place of occurrence of the external cause: Secondary | ICD-10-CM | POA: Diagnosis not present

## 2015-05-13 DIAGNOSIS — S329XXA Fracture of unspecified parts of lumbosacral spine and pelvis, initial encounter for closed fracture: Secondary | ICD-10-CM | POA: Diagnosis present

## 2015-05-13 DIAGNOSIS — S199XXA Unspecified injury of neck, initial encounter: Secondary | ICD-10-CM | POA: Diagnosis not present

## 2015-05-13 DIAGNOSIS — S32810A Multiple fractures of pelvis with stable disruption of pelvic ring, initial encounter for closed fracture: Secondary | ICD-10-CM

## 2015-05-13 DIAGNOSIS — M25559 Pain in unspecified hip: Secondary | ICD-10-CM | POA: Diagnosis not present

## 2015-05-13 DIAGNOSIS — Z8673 Personal history of transient ischemic attack (TIA), and cerebral infarction without residual deficits: Secondary | ICD-10-CM | POA: Insufficient documentation

## 2015-05-13 DIAGNOSIS — S32512A Fracture of superior rim of left pubis, initial encounter for closed fracture: Secondary | ICD-10-CM | POA: Diagnosis not present

## 2015-05-13 DIAGNOSIS — I4891 Unspecified atrial fibrillation: Secondary | ICD-10-CM | POA: Diagnosis present

## 2015-05-13 DIAGNOSIS — E119 Type 2 diabetes mellitus without complications: Secondary | ICD-10-CM | POA: Insufficient documentation

## 2015-05-13 DIAGNOSIS — S3991XA Unspecified injury of abdomen, initial encounter: Secondary | ICD-10-CM | POA: Diagnosis not present

## 2015-05-13 HISTORY — DX: Cerebrovascular disease, unspecified: I67.9

## 2015-05-13 HISTORY — DX: Pulmonary hypertension, unspecified: I27.20

## 2015-05-13 HISTORY — DX: Degenerative disease of nervous system, unspecified: G31.9

## 2015-05-13 HISTORY — DX: Rheumatic tricuspid insufficiency: I07.1

## 2015-05-13 MED ORDER — ONDANSETRON HCL 4 MG/2ML IJ SOLN
4.0000 mg | Freq: Once | INTRAMUSCULAR | Status: AC
  Administered 2015-05-13: 4 mg via INTRAVENOUS
  Filled 2015-05-13: qty 2

## 2015-05-13 NOTE — ED Provider Notes (Addendum)
CSN: [id number]     Arrival date & time 05/13/15  2207 History  By signing my name below, I, Eustaquio Maize, attest that this documentation has been prepared under the direction and in the presence of Luisfelipe Engelstad, MD. Electronically Signed: Eustaquio Maize, ED Scribe. 05/13/2015. 11:29 PM.  Chief Complaint  Patient presents with  . Fall   Patient is a 81 y.o. female presenting with fall. The history is provided by the patient and a relative. No language interpreter was used.  Fall This is a new problem. The current episode started 3 to 5 hours ago. The problem occurs constantly. The problem has not changed since onset.Pertinent negatives include no headaches. Nothing aggravates the symptoms. Nothing relieves the symptoms. She has tried nothing for the symptoms. The treatment provided no relief.     HPI Comments: Wendy Hopkins is a 81 y.o. female brought in by ambulance, with hx atrial fibrillation who presents to the Emergency Department complaining of sudden onset, constant, left lower back pain and left hip pain s/p ground level fall that occurred approximately 3.5 hours. Pt states that she stumbled and fell. No head injury or LOC. Denies weakness, numbness, or any other associated symptoms. Pt is currently on Coumadin.   Past Medical History  Diagnosis Date  . Arthritis   . Hyperlipidemia   . 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 Closed fracture of unspecified part of femur 2008  . TIA (transient ischemic attack)   . Edema   . Chronic diastolic heart failure ([place])   . Osteoporosis with fracture     compression fx lower T spine and t4, L rib  . Thyroid nodule     dominate R nodule, s/p aspiration>abn path>6 mo obs planned ([place] 06/01/11)  . GERD (gastroesophageal reflux disease)   . Hypertension 2006  . Atrial fibrillation ([facility])     chronic anticoag; and AFlutter  . History of radiation therapy 11/30/2012-12/09/2012    50 gray to right chest  .  Vertebral compression fracture [facility])     Thoracic 12 and Thoracic 7  . Adenocarcinoma, lung ([place]) dx 11/08/12    s/p CT chest bx - XRT thru 12/2012  . Diabetes mellitus, type II, insulin dependent ([place])     over 40 years ago   Past Surgical History  Procedure Laterality Date  . Right femur  06/25/07    Intramedullary open reduction internal fixation  . Vitrectomy  2007    right eye with cataract repair - dr. Cordelia Pen  . Tonsillectomy  1947   Family History  Problem Relation Age of Onset  . Hypertension Father     Parent not sure which 1  . Diabetes Brother   . Lung cancer Other   . Cancer Sister   . Parkinsonism Sister    Social History  Substance Use Topics  . Smoking status: Never Smoker   . Smokeless tobacco: Never Used  . Alcohol Use: No   OB History    No data available     Review of Systems  Musculoskeletal: Positive for back pain and arthralgias (Left hip). Negative for neck pain.  Skin: Negative for wound.  Neurological: Negative for syncope, weakness, numbness and headaches.  All other systems reviewed and are negative.   Allergies  Codeine  Home Medications   Prior to Admission medications   Medication Sig Start Date End Date Taking? Authorizing Provider  atorvastatin (LIPITOR) 10 MG tablet Take 10 mg by mouth daily.  10/23/14  Yes Historical Provider, MD  cholecalciferol (VITAMIN D) 1000 UNITS tablet Take 1,000 Units by mouth daily.    Yes Historical Provider, MD  furosemide (LASIX) 40 MG tablet TAKE 2 TABLETS BY MOUTH EVERY DAY 01/01/15  Yes Deboraha Sprang, MD  Insulin Glargine (TOUJEO SOLOSTAR [place]) Inject 5 Units into the skin daily.    Yes Historical Provider, MD  insulin lispro (HUMALOG) 100 UNIT/ML injection Inject 3-4 Units into the skin 3 (three) times daily with meals. Depending on sugar level.   Yes Historical Provider, MD  spironolactone (ALDACTONE) 25 MG tablet Take 25 mg by mouth daily.  04/10/15  Yes Historical Provider, MD  warfarin (COUMADIN) 5  MG tablet Take as instructed by the anticoagulation clinic Patient taking differently: Take 2.5-5 mg by mouth daily. Take 1 tablet (5 mg) daily except for Fridays Take 0.5 tablet (2.5 mg) 02/13/15  Yes Rowe Clack, MD  ACCU-CHEK SMARTVIEW test strip Use test strips to check blood sugar at least 3 times a day. 08/11/14   Historical Provider, MD  acetaminophen (TYLENOL) 500 MG tablet Take 500 mg by mouth every 6 (six) hours as needed.    Historical Provider, MD  Insulin Syringe-Needle U-100 30G X 5/16" 1 ML MISC by Does not apply route.    Historical Provider, MD  warfarin (COUMADIN) 5 MG tablet TAKE AS DIRECTED BY ANTICOAGULATION CLINIC Patient not taking: Reported on 05/13/2015 02/13/15   Rowe Clack, MD   Triage Vitals: BP 134/64 mmHg  Pulse 83  Temp(Src) 97.6 F (36.4 C) (Oral)  Resp 18  SpO2 100%   Physical Exam  Constitutional: She is oriented to person, place, and time. She appears well-developed and well-nourished. No distress.  HENT:  Head: Normocephalic and atraumatic.  Mouth/Throat: Oropharynx is clear and moist.  No battle sign No Raccoon eyes No hemotympanum on right or left   Eyes: Conjunctivae and EOM are normal. Pupils are equal, round, and reactive to light.  Cataract surgery in the past  Neck: Normal range of motion. Neck supple. No tracheal deviation present.  No step offs or crepitus of the C spine Maintains c spine precaution  Flog rolled, 3 person  Cardiovascular: Normal rate.  An irregularly irregular rhythm present.  Intact dorsalis pedis pulses  Pulmonary/Chest: Effort normal. No respiratory distress.  Equal and symmetric breath sounds  Abdominal: Soft. She exhibits no mass. There is no tenderness. There is no rebound and no guarding.  Hyperactive bowel sounds throughout No bruising on lower abdominal wall  Musculoskeletal: Normal range of motion.  Pelvis is stable No step offs or crepitus of C, T, or L spine Intact rectal tone  Neurological: She  is alert and oriented to person, place, and time. She has normal reflexes.  Intact lower DTRs  Skin: Skin is warm and dry.  Psychiatric: She has a normal mood and affect. Her behavior is normal.  Nursing note and vitals reviewed.   ED Course  Procedures (including critical care time)  DIAGNOSTIC STUDIES: Oxygen Saturation is 100% on RA, normal by my interpretation.    COORDINATION OF CARE: 11:23 PM-Discussed treatment plan which includes CT Head, CT C Spine, CT Chest, CT A/P, CBC, Chem 8, Protime INR with pt at bedside and pt agreed to plan.   Labs Review Labs Reviewed - No data to display  Imaging Review No results found. I have personally reviewed and evaluated these images and lab results as part of my medical decision-making.   EKG Interpretation None  MDM   Final diagnoses:  None   Results for orders placed or performed during the hospital encounter of 05/13/15  CBC with Differential/Platelet  Result Value Ref Range   WBC 13.6 (H) 4.0 - 10.5 K/uL   RBC 3.96 3.87 - 5.11 MIL/uL   Hemoglobin 11.7 (L) 12.0 - 15.0 g/dL   HCT 35.8 (L) 36.0 - 46.0 %   MCV 90.4 78.0 - 100.0 fL   MCH 29.5 26.0 - 34.0 pg   MCHC 32.7 30.0 - 36.0 g/dL   RDW 14.9 11.5 - 15.5 %   Platelets 148 (L) 150 - 400 K/uL   Neutrophils Relative % 90 %   Neutro Abs 12.3 (H) 1.7 - 7.7 K/uL   Lymphocytes Relative 5 %   Lymphs Abs 0.6 (L) 0.7 - 4.0 K/uL   Monocytes Relative 5 %   Monocytes Absolute 0.7 0.1 - 1.0 K/uL   Eosinophils Relative 0 %   Eosinophils Absolute 0.0 0.0 - 0.7 K/uL   Basophils Relative 0 %   Basophils Absolute 0.0 0.0 - 0.1 K/uL  Protime-INR  Result Value Ref Range   Prothrombin Time 28.0 (H) 11.6 - 15.2 seconds   INR 2.67 (H) 0.00 - 1.49  I-Stat Chem 8, ED  Result Value Ref Range   Sodium 140 135 - 145 mmol/L   Potassium 3.2 (L) 3.5 - 5.1 mmol/L   Chloride 99 (L) 101 - 111 mmol/L   BUN 21 (H) 6 - 20 mg/dL   Creatinine, Ser 1.20 (H) 0.44 - 1.00 mg/dL   Glucose, Bld  91 65 - 99 mg/dL   Calcium, Ion 1.08 (L) 1.13 - 1.30 mmol/L   TCO2 29 0 - 100 mmol/L   Hemoglobin 13.3 12.0 - 15.0 g/dL   HCT 39.0 36.0 - 46.0 %  POC CBG, ED  Result Value Ref Range   Glucose-Capillary 163 (H) 65 - 99 mg/dL   Comment 1 Notify RN    Comment 2 Document in Chart   I-stat chem 8, ed  Result Value Ref Range   Sodium 140 135 - 145 mmol/L   Potassium 3.1 (L) 3.5 - 5.1 mmol/L   Chloride 99 (L) 101 - 111 mmol/L   BUN 21 (H) 6 - 20 mg/dL   Creatinine, Ser 1.20 (H) 0.44 - 1.00 mg/dL   Glucose, Bld 85 65 - 99 mg/dL   Calcium, Ion 1.09 (L) 1.13 - 1.30 mmol/L   TCO2 28 0 - 100 mmol/L   Hemoglobin 13.3 12.0 - 15.0 g/dL   HCT 39.0 36.0 - 46.0 %  Type and screen  Result Value Ref Range   ABO/RH(D) O NEG    Antibody Screen PENDING    Sample Expiration 05/16/2015    Ct Head Wo Contrast  05/14/2015   CLINICAL DATA:  [place].  Anticoagulated.  EXAM: CT HEAD WITHOUT CONTRAST  CT CERVICAL SPINE WITHOUT CONTRAST  TECHNIQUE: Multidetector CT imaging of the head and cervical spine was performed following the standard protocol without intravenous contrast. Multiplanar CT image reconstructions of the cervical spine were also generated.  COMPARISON:  None.  FINDINGS: CT HEAD FINDINGS  There is no intracranial hemorrhage or extra-axial fluid collection. There is moderately severe generalized atrophy. There is mild white matter hypodensity consistent with chronic small vessel disease. Calvarium and skullbase are intact.  CT CERVICAL SPINE FINDINGS  The vertebral column, pedicles and facet articulations are intact. There is no evidence of acute fracture. No acute soft tissue abnormalities are evident.  Mild arthritic changes  are evident in the midcervical spine. Severe atlantoaxial degenerative changes are present.  IMPRESSION: 1. Negative for acute intracranial traumatic injury. There is moderately severe generalized atrophy and chronic small vessel disease. 2. Negative for acute cervical spine fracture    Electronically Signed   By: Andreas Newport M.D.   On: 05/14/2015 02:40   Ct Chest W Contrast  05/14/2015   CLINICAL DATA:  Lower back and hip pain after falling.  EXAM: CT CHEST, ABDOMEN, AND PELVIS WITH CONTRAST  TECHNIQUE: Multidetector CT imaging of the chest, abdomen and pelvis was performed following the standard protocol during bolus administration of intravenous contrast.  CONTRAST:  27m OMNIPAQUE IOHEXOL 300 MG/ML  SOLN  COMPARISON:  01/03/2015, 11/30/2014, 04/24/2014.  FINDINGS: CT CHEST FINDINGS  Mediastinum/Nodes: The mediastinum and intrathoracic vascular structures are intact. There is no mediastinal hemorrhage.  Lungs/Pleura: There is no pneumothorax. There is no effusion. There is unchanged right lower lobe collapse and consolidation.  Musculoskeletal: No acute fracture is evident. There is chronic moderately displaced fracture of the sternum. There is severe thoracic kyphosis with chronic severe compressions of T6-T8, compression of T12, and moderate superior endplate impaction at L3. These are unchanged. Prior kyphoplasties again noted in the thoracic spine. Multiple chronic rib fracture deformities are present.  CT ABDOMEN PELVIS FINDINGS  Hepatobiliary: A few unchanged sub cm hepatic hypodensities are present. There is no hepatic laceration.  Pancreas: Unremarkable  Spleen: Normal except for a small unchanged hypodense focus anteriorly.  Adrenals/Urinary Tract: Adrenals and kidneys are unremarkable. Ureters are unremarkable. Urinary bladder is moderately distended but appears intact.  Stomach/Bowel: Bowel appears intact. No peritoneal free air or blood.  Vascular/Lymphatic: The abdominal aorta is normal in caliber with extensive atherosclerotic calcification.  Reproductive: Unremarkable  Musculoskeletal: There is diastases of the left sacroiliac joint. There are fractures of the left iliac bone adjacent to the sacroiliac joint, minimally displaced. There are moderately displaced left  superior and inferior pubic rami fractures. The hips and acetabuli are intact. Pubic symphysis is intact.  IMPRESSION: 1. Negative for acute intrathoracic traumatic injury. 2. There are acute pelvic fractures including the left pubic rami, and left iliac bone adjacent to the SI joint. There is diastases of the left SI joint. 3. No significant hematoma associated with these pelvic fractures. 4. No evidence of bowel or intra-abdominal parenchymal organ injury.   Electronically Signed   By: DAndreas NewportM.D.   On: 05/14/2015 03:00   Ct Cervical Spine Wo Contrast  05/14/2015   CLINICAL DATA:  [place]  Anticoagulated.  EXAM: CT HEAD WITHOUT CONTRAST  CT CERVICAL SPINE WITHOUT CONTRAST  TECHNIQUE: Multidetector CT imaging of the head and cervical spine was performed following the standard protocol without intravenous contrast. Multiplanar CT image reconstructions of the cervical spine were also generated.  COMPARISON:  None.  FINDINGS: CT HEAD FINDINGS  There is no intracranial hemorrhage or extra-axial fluid collection. There is moderately severe generalized atrophy. There is mild white matter hypodensity consistent with chronic small vessel disease. Calvarium and skullbase are intact.  CT CERVICAL SPINE FINDINGS  The vertebral column, pedicles and facet articulations are intact. There is no evidence of acute fracture. No acute soft tissue abnormalities are evident.  Mild arthritic changes are evident in the midcervical spine. Severe atlantoaxial degenerative changes are present.  IMPRESSION: 1. Negative for acute intracranial traumatic injury. There is moderately severe generalized atrophy and chronic small vessel disease. 2. Negative for acute cervical spine fracture   Electronically Signed   By: DQuillian Quince  Armandina Stammer M.D.   On: 05/14/2015 02:40   Ct Abdomen Pelvis W Contrast  05/14/2015   CLINICAL DATA:  Lower back and hip pain after falling.  EXAM: CT CHEST, ABDOMEN, AND PELVIS WITH CONTRAST  TECHNIQUE:  Multidetector CT imaging of the chest, abdomen and pelvis was performed following the standard protocol during bolus administration of intravenous contrast.  CONTRAST:  70m OMNIPAQUE IOHEXOL 300 MG/ML  SOLN  COMPARISON:  01/03/2015, 11/30/2014, 04/24/2014.  FINDINGS: CT CHEST FINDINGS  Mediastinum/Nodes: The mediastinum and intrathoracic vascular structures are intact. There is no mediastinal hemorrhage.  Lungs/Pleura: There is no pneumothorax. There is no effusion. There is unchanged right lower lobe collapse and consolidation.  Musculoskeletal: No acute fracture is evident. There is chronic moderately displaced fracture of the sternum. There is severe thoracic kyphosis with chronic severe compressions of T6-T8, compression of T12, and moderate superior endplate impaction at L3. These are unchanged. Prior kyphoplasties again noted in the thoracic spine. Multiple chronic rib fracture deformities are present.  CT ABDOMEN PELVIS FINDINGS  Hepatobiliary: A few unchanged sub cm hepatic hypodensities are present. There is no hepatic laceration.  Pancreas: Unremarkable  Spleen: Normal except for a small unchanged hypodense focus anteriorly.  Adrenals/Urinary Tract: Adrenals and kidneys are unremarkable. Ureters are unremarkable. Urinary bladder is moderately distended but appears intact.  Stomach/Bowel: Bowel appears intact. No peritoneal free air or blood.  Vascular/Lymphatic: The abdominal aorta is normal in caliber with extensive atherosclerotic calcification.  Reproductive: Unremarkable  Musculoskeletal: There is diastases of the left sacroiliac joint. There are fractures of the left iliac bone adjacent to the sacroiliac joint, minimally displaced. There are moderately displaced left superior and inferior pubic rami fractures. The hips and acetabuli are intact. Pubic symphysis is intact.  IMPRESSION: 1. Negative for acute intrathoracic traumatic injury. 2. There are acute pelvic fractures including the left pubic  rami, and left iliac bone adjacent to the SI joint. There is diastases of the left SI joint. 3. No significant hematoma associated with these pelvic fractures. 4. No evidence of bowel or intra-abdominal parenchymal organ injury.   Electronically Signed   By: DAndreas NewportM.D.   On: 05/14/2015 03:00   Dg Hip Unilat With Pelvis 2-3 Views Left  05/13/2015   CLINICAL DATA:  Fall  EXAM: DG HIP (WITH OR WITHOUT PELVIS) 2-3V LEFT  COMPARISON:  None.  FINDINGS: There are fractures of the left superior and inferior pubic rami with displacement. Bones are osteopenic. The left femoral neck is grossly intact.  IMPRESSION: Osteopenia  Displaced fractures of the left superior and inferior pubic rami.   Electronically Signed   By: AMarybelle KillingsM.D.   On: 05/13/2015 23:38    Medications  dextrose 5 %-0.9 % sodium chloride infusion (not administered)  ondansetron (ZOFRAN) injection 4 mg (4 mg Intravenous Given 05/13/15 2211)  fentaNYL (SUBLIMAZE) injection 100 mcg (100 mcg Intravenous Given 05/14/15 0108)  ondansetron (ZOFRAN) injection 4 mg (4 mg Intravenous Given 05/14/15 0104)  iohexol (OMNIPAQUE) 300 MG/ML solution 100 mL (80 mLs Intravenous Contrast Given 05/14/15 0228)  ketorolac (TORADOL) 30 MG/ML injection 30 mg (30 mg Intravenous Given 05/14/15 0352)   Case d/w Dr. MPercell Millerof orthopedics who will see the patient in consult please admit to medicine  I, Aleicia Kenagy-RASCH,Gearlene Godsil K, personally performed the services described in this documentation. All medical record entries made by the scribe were at my direction and in my presence.  I have reviewed the chart and discharge instructions and agree that the record reflects  my personal performance and is accurate and complete. Yari Szeliga-RASCH,Tamella Tuccillo K.  05/14/2015. 4:29 AM.       Scotty Pinder, MD 05/14/15 [id number]  Veatrice Kells, MD 05/14/15 0430

## 2015-05-13 NOTE — ED Notes (Signed)
Pt is from home where she lives with her husband.  Pt was walking in house and her feet got tangled.  Denies dizziness prior to fall and LOC.  Pt stated that husband helped her up and she had 10/10 pain in L hip.  C/O pain in lumbar spine too.  Family states that there is chronic pain in that area.  CBG 94  BP 148/84 P 80

## 2015-05-13 NOTE — ED Notes (Signed)
Bed: WA06 Expected date:  Expected time:  Means of arrival:  Comments: 81 yo fall

## 2015-05-13 NOTE — ED Notes (Signed)
Pt arrived to ED room nauseous and had 2 episodes of vomiting while on back board.

## 2015-05-14 ENCOUNTER — Encounter (HOSPITAL_COMMUNITY): Payer: Self-pay

## 2015-05-14 ENCOUNTER — Ambulatory Visit: Payer: Medicare Other | Admitting: Internal Medicine

## 2015-05-14 ENCOUNTER — Emergency Department (HOSPITAL_COMMUNITY): Payer: Medicare Other

## 2015-05-14 DIAGNOSIS — E119 Type 2 diabetes mellitus without complications: Secondary | ICD-10-CM | POA: Diagnosis not present

## 2015-05-14 DIAGNOSIS — S3282XA Multiple fractures of pelvis without disruption of pelvic ring, initial encounter for closed fracture: Secondary | ICD-10-CM | POA: Diagnosis present

## 2015-05-14 DIAGNOSIS — S299XXA Unspecified injury of thorax, initial encounter: Secondary | ICD-10-CM | POA: Diagnosis not present

## 2015-05-14 DIAGNOSIS — S3991XA Unspecified injury of abdomen, initial encounter: Secondary | ICD-10-CM | POA: Diagnosis not present

## 2015-05-14 DIAGNOSIS — IMO0002 Reserved for concepts with insufficient information to code with codable children: Secondary | ICD-10-CM

## 2015-05-14 DIAGNOSIS — I5032 Chronic diastolic (congestive) heart failure: Secondary | ICD-10-CM | POA: Diagnosis not present

## 2015-05-14 DIAGNOSIS — S32810A Multiple fractures of pelvis with stable disruption of pelvic ring, initial encounter for closed fracture: Secondary | ICD-10-CM

## 2015-05-14 DIAGNOSIS — S0990XA Unspecified injury of head, initial encounter: Secondary | ICD-10-CM | POA: Diagnosis not present

## 2015-05-14 DIAGNOSIS — S329XXA Fracture of unspecified parts of lumbosacral spine and pelvis, initial encounter for closed fracture: Secondary | ICD-10-CM | POA: Diagnosis present

## 2015-05-14 DIAGNOSIS — S199XXA Unspecified injury of neck, initial encounter: Secondary | ICD-10-CM | POA: Diagnosis not present

## 2015-05-14 DIAGNOSIS — I10 Essential (primary) hypertension: Secondary | ICD-10-CM | POA: Diagnosis not present

## 2015-05-14 LAB — COMPREHENSIVE METABOLIC PANEL
ALBUMIN: 3.5 g/dL (ref 3.5–5.0)
ALT: 20 U/L (ref 14–54)
AST: 25 U/L (ref 15–41)
Alkaline Phosphatase: 50 U/L (ref 38–126)
Anion gap: 11 (ref 5–15)
BUN: 24 mg/dL — AB (ref 6–20)
CO2: 27 mmol/L (ref 22–32)
Calcium: 8.5 mg/dL — ABNORMAL LOW (ref 8.9–10.3)
Chloride: 96 mmol/L — ABNORMAL LOW (ref 101–111)
Creatinine, Ser: 1.51 mg/dL — ABNORMAL HIGH (ref 0.44–1.00)
GFR calc Af Amer: 35 mL/min — ABNORMAL LOW (ref >60)
GFR calc non Af Amer: 30 mL/min — ABNORMAL LOW (ref >60)
GLUCOSE: 363 mg/dL — AB (ref 65–99)
POTASSIUM: 4.8 mmol/L (ref 3.5–5.1)
Sodium: 134 mmol/L — ABNORMAL LOW (ref 135–145)
Total Bilirubin: 0.7 mg/dL (ref 0.3–1.2)
Total Protein: 6.5 g/dL (ref 6.5–8.1)

## 2015-05-14 LAB — CBC WITH DIFFERENTIAL/PLATELET
BASOS ABS: 0 10*3/uL (ref 0.0–0.1)
BASOS PCT: 0 %
Basophils Absolute: 0 10*3/uL (ref 0.0–0.1)
Basophils Relative: 0 %
EOS PCT: 0 %
Eosinophils Absolute: 0 10*3/uL (ref 0.0–0.7)
Eosinophils Absolute: 0 10*3/uL (ref 0.0–0.7)
Eosinophils Relative: 0 %
HCT: 35.8 % — ABNORMAL LOW (ref 36.0–46.0)
HCT: 34.8 % — ABNORMAL LOW (ref 36.0–46.0)
HEMOGLOBIN: 11.7 g/dL — AB (ref 12.0–15.0)
Hemoglobin: 11.3 g/dL — ABNORMAL LOW (ref 12.0–15.0)
LYMPHS ABS: 0.5 10*3/uL — AB (ref 0.7–4.0)
LYMPHS PCT: 5 %
Lymphocytes Relative: 5 %
Lymphs Abs: 0.6 10*3/uL — ABNORMAL LOW (ref 0.7–4.0)
MCH: 29.5 pg (ref 26.0–34.0)
MCH: 29.6 pg (ref 26.0–34.0)
MCHC: 32.7 g/dL (ref 30.0–36.0)
MCHC: 32.5 g/dL (ref 30.0–36.0)
MCV: 90.4 fL (ref 78.0–100.0)
MCV: 91.1 fL (ref 78.0–100.0)
MONO ABS: 0.7 10*3/uL (ref 0.1–1.0)
MONO ABS: 0.5 10*3/uL (ref 0.1–1.0)
MONOS PCT: 5 %
Monocytes Relative: 5 %
NEUTROS PCT: 90 %
Neutro Abs: 12.3 10*3/uL — ABNORMAL HIGH (ref 1.7–7.7)
Neutro Abs: 9 10*3/uL — ABNORMAL HIGH (ref 1.7–7.7)
Neutrophils Relative %: 90 %
PLATELETS: 127 10*3/uL — AB (ref 150–400)
Platelets: 148 10*3/uL — ABNORMAL LOW (ref 150–400)
RBC: 3.96 MIL/uL (ref 3.87–5.11)
RBC: 3.82 MIL/uL — ABNORMAL LOW (ref 3.87–5.11)
RDW: 14.9 % (ref 11.5–15.5)
RDW: 15.2 % (ref 11.5–15.5)
WBC: 13.6 10*3/uL — ABNORMAL HIGH (ref 4.0–10.5)
WBC: 9.9 10*3/uL (ref 4.0–10.5)

## 2015-05-14 LAB — I-STAT CHEM 8, ED
BUN: 21 mg/dL — ABNORMAL HIGH (ref 6–20)
BUN: 21 mg/dL — AB (ref 6–20)
CREATININE: 1.2 mg/dL — AB (ref 0.44–1.00)
CREATININE: 1.2 mg/dL — AB (ref 0.44–1.00)
Calcium, Ion: 1.08 mmol/L — ABNORMAL LOW (ref 1.13–1.30)
Calcium, Ion: 1.09 mmol/L — ABNORMAL LOW (ref 1.13–1.30)
Chloride: 99 mmol/L — ABNORMAL LOW (ref 101–111)
Chloride: 99 mmol/L — ABNORMAL LOW (ref 101–111)
GLUCOSE: 85 mg/dL (ref 65–99)
GLUCOSE: 91 mg/dL (ref 65–99)
HCT: 39 % (ref 36.0–46.0)
HEMATOCRIT: 39 % (ref 36.0–46.0)
HEMOGLOBIN: 13.3 g/dL (ref 12.0–15.0)
Hemoglobin: 13.3 g/dL (ref 12.0–15.0)
POTASSIUM: 3.2 mmol/L — AB (ref 3.5–5.1)
Potassium: 3.1 mmol/L — ABNORMAL LOW (ref 3.5–5.1)
Sodium: 140 mmol/L (ref 135–145)
Sodium: 140 mmol/L (ref 135–145)
TCO2: 28 mmol/L (ref 0–100)
TCO2: 29 mmol/L (ref 0–100)

## 2015-05-14 LAB — GLUCOSE, CAPILLARY
GLUCOSE-CAPILLARY: 296 mg/dL — AB (ref 65–99)
GLUCOSE-CAPILLARY: 267 mg/dL — AB (ref 65–99)
GLUCOSE-CAPILLARY: 159 mg/dL — AB (ref 65–99)
Glucose-Capillary: 302 mg/dL — ABNORMAL HIGH (ref 65–99)
Glucose-Capillary: 172 mg/dL — ABNORMAL HIGH (ref 65–99)

## 2015-05-14 LAB — CBC
HEMATOCRIT: 31.3 % — AB (ref 36.0–46.0)
HEMOGLOBIN: 10.3 g/dL — AB (ref 12.0–15.0)
MCH: 30.2 pg (ref 26.0–34.0)
MCHC: 32.9 g/dL (ref 30.0–36.0)
MCV: 91.8 fL (ref 78.0–100.0)
Platelets: 132 10*3/uL — ABNORMAL LOW (ref 150–400)
RBC: 3.41 MIL/uL — ABNORMAL LOW (ref 3.87–5.11)
RDW: 15.4 % (ref 11.5–15.5)
WBC: 8.6 10*3/uL (ref 4.0–10.5)

## 2015-05-14 LAB — TYPE AND SCREEN
ABO/RH(D): O NEG
ANTIBODY SCREEN: NEGATIVE

## 2015-05-14 LAB — PROTIME-INR
INR: 2.67 — ABNORMAL HIGH (ref 0.00–1.49)
INR: 2.92 — AB (ref 0.00–1.49)
PROTHROMBIN TIME: 30 s — AB (ref 11.6–15.2)
Prothrombin Time: 28 seconds — ABNORMAL HIGH (ref 11.6–15.2)

## 2015-05-14 LAB — CBG MONITORING, ED: GLUCOSE-CAPILLARY: 163 mg/dL — AB (ref 65–99)

## 2015-05-14 MED ORDER — ONDANSETRON HCL 4 MG PO TABS: 4.0000 mg | ORAL_TABLET | Freq: Four times a day (QID) | ORAL | Status: DC | PRN

## 2015-05-14 MED ORDER — FUROSEMIDE 80 MG PO TABS
80.0000 mg | ORAL_TABLET | Freq: Every day | ORAL | Status: DC
  Filled 2015-05-14: qty 1

## 2015-05-14 MED ORDER — INSULIN GLARGINE 100 UNIT/ML ~~LOC~~ SOLN
5.0000 [IU] | Freq: Every day | SUBCUTANEOUS | Status: DC
Start: 2015-05-14 — End: 2015-05-15
  Administered 2015-05-14 – 2015-05-15 (×2): 5 [IU] via SUBCUTANEOUS
  Filled 2015-05-14 (×2): qty 0.05

## 2015-05-14 MED ORDER — ATORVASTATIN CALCIUM 10 MG PO TABS
10.0000 mg | ORAL_TABLET | Freq: Every day | ORAL | Status: DC
  Administered 2015-05-14: 10 mg via ORAL
  Filled 2015-05-14 (×3): qty 1

## 2015-05-14 MED ORDER — IOHEXOL 300 MG/ML  SOLN
100.0000 mL | Freq: Once | INTRAMUSCULAR | Status: AC | PRN
  Administered 2015-05-14: 80 mL via INTRAVENOUS

## 2015-05-14 MED ORDER — ACETAMINOPHEN 325 MG PO TABS
650.0000 mg | ORAL_TABLET | Freq: Four times a day (QID) | ORAL | Status: DC | PRN
  Administered 2015-05-14: 650 mg via ORAL
  Filled 2015-05-14: qty 2

## 2015-05-14 MED ORDER — FENTANYL CITRATE (PF) 100 MCG/2ML IJ SOLN
100.0000 ug | Freq: Once | INTRAMUSCULAR | Status: AC
  Administered 2015-05-14: 100 ug via INTRAVENOUS
  Filled 2015-05-14: qty 2

## 2015-05-14 MED ORDER — KETOROLAC TROMETHAMINE 30 MG/ML IJ SOLN
30.0000 mg | Freq: Once | INTRAMUSCULAR | Status: AC
  Administered 2015-05-14: 30 mg via INTRAVENOUS
  Filled 2015-05-14: qty 1

## 2015-05-14 MED ORDER — POTASSIUM CHLORIDE CRYS ER 20 MEQ PO TBCR
20.0000 meq | EXTENDED_RELEASE_TABLET | Freq: Once | ORAL | Status: AC
  Administered 2015-05-14: 20 meq via ORAL
  Filled 2015-05-14: qty 1

## 2015-05-14 MED ORDER — ONDANSETRON HCL 4 MG/2ML IJ SOLN
4.0000 mg | Freq: Four times a day (QID) | INTRAMUSCULAR | Status: DC | PRN
  Administered 2015-05-15: 4 mg via INTRAVENOUS
  Filled 2015-05-14: qty 2

## 2015-05-14 MED ORDER — SPIRONOLACTONE 25 MG PO TABS
25.0000 mg | ORAL_TABLET | Freq: Every day | ORAL | Status: DC
  Filled 2015-05-14: qty 1

## 2015-05-14 MED ORDER — INSULIN ASPART 100 UNIT/ML ~~LOC~~ SOLN
0.0000 [IU] | Freq: Three times a day (TID) | SUBCUTANEOUS | Status: DC
Start: 2015-05-14 — End: 2015-05-15
  Administered 2015-05-14: 2 [IU] via SUBCUTANEOUS
  Administered 2015-05-14: 7 [IU] via SUBCUTANEOUS
  Administered 2015-05-14: 5 [IU] via SUBCUTANEOUS
  Administered 2015-05-15: 9 [IU] via SUBCUTANEOUS
  Administered 2015-05-15: 3 [IU] via SUBCUTANEOUS

## 2015-05-14 MED ORDER — WARFARIN SODIUM 5 MG PO TABS
5.0000 mg | ORAL_TABLET | ORAL | Status: DC
  Administered 2015-05-14: 5 mg via ORAL
  Filled 2015-05-14 (×3): qty 1

## 2015-05-14 MED ORDER — ONDANSETRON HCL 4 MG/2ML IJ SOLN
4.0000 mg | Freq: Once | INTRAMUSCULAR | Status: AC
  Administered 2015-05-14: 4 mg via INTRAVENOUS
  Filled 2015-05-14: qty 2

## 2015-05-14 MED ORDER — ACETAMINOPHEN 650 MG RE SUPP: 650.0000 mg | Freq: Four times a day (QID) | RECTAL | Status: DC | PRN

## 2015-05-14 MED ORDER — DEXTROSE-NACL 5-0.9 % IV SOLN
Freq: Once | INTRAVENOUS | Status: AC
  Administered 2015-05-14: 05:00:00 via INTRAVENOUS

## 2015-05-14 MED ORDER — SODIUM CHLORIDE 0.9 % IV BOLUS (SEPSIS)
500.0000 mL | Freq: Once | INTRAVENOUS | Status: AC
  Administered 2015-05-14: 500 mL via INTRAVENOUS

## 2015-05-14 MED ORDER — TRAMADOL HCL 50 MG PO TABS
50.0000 mg | ORAL_TABLET | Freq: Four times a day (QID) | ORAL | Status: DC | PRN
  Administered 2015-05-14 – 2015-05-15 (×2): 50 mg via ORAL
  Filled 2015-05-14 (×2): qty 1

## 2015-05-14 MED ORDER — SODIUM CHLORIDE 0.9 % IJ SOLN
3.0000 mL | Freq: Two times a day (BID) | INTRAMUSCULAR | Status: DC
  Administered 2015-05-14: 3 mL via INTRAVENOUS

## 2015-05-14 MED ORDER — WARFARIN - PHARMACIST DOSING INPATIENT: Freq: Every day | Status: DC

## 2015-05-14 MED ORDER — MORPHINE SULFATE (PF) 2 MG/ML IV SOLN
1.0000 mg | INTRAVENOUS | Status: DC | PRN
Start: 2015-05-14 — End: 2015-05-14

## 2015-05-14 MED ORDER — WARFARIN SODIUM 2.5 MG PO TABS: 2.5000 mg | ORAL_TABLET | ORAL | Status: DC

## 2015-05-14 NOTE — Clinical Social Work Placement (Signed)
   CLINICAL SOCIAL WORK PLACEMENT  NOTE  Date:  05/14/2015  Patient Details  Name: Wendy Hopkins MRN: [id number] Date of Birth: Apr 18, 1926  Clinical Social Work is seeking post-discharge placement for this patient at the [place] level of care (*CSW will initial, date and re-position this form in  chart as items are completed):  Yes   Patient/family provided with [facility] Work Department's list of facilities offering this level of care within the geographic area requested by the patient (or if unable, by the patient's family).  Yes   Patient/family informed of their freedom to choose among providers that offer the needed level of care, that participate in Medicare, Medicaid or managed care program needed by the patient, have an available bed and are willing to accept the patient.  Yes   Patient/family informed of [facility]'s ownership interest in [facility] and [facility], as well as of the fact that they are under no obligation to receive care at these facilities.  PASRR submitted to EDS on 05/14/15     PASRR number received on 05/14/15     Existing PASRR number confirmed on       FL2 transmitted to all facilities in geographic area requested by pt/family on 05/14/15     FL2 transmitted to all facilities within larger geographic area on       Patient informed that his/her managed care company has contracts with or will negotiate with certain facilities, including the following:            Patient/family informed of bed offers received.  Patient chooses bed at       Physician recommends and patient chooses bed at      Patient to be transferred to   on  .  Patient to be transferred to facility by       Patient family notified on   of transfer.  Name of family member notified:        PHYSICIAN       Additional Comment:    _______________________________________________ Luretha Rued, [place] 05/14/2015,  3:08 PM

## 2015-05-14 NOTE — ED Notes (Signed)
Report given to Rebecca on 6th floor

## 2015-05-14 NOTE — H&P (Signed)
[facility] History and Physical  MARRI MCNEFF ZOX:[id number] DOB: [birth year] DOA: 05/13/2015  Referring physician: Dr.Plumbo. PCP: [name], MD  Specialists: Dr.Moody. Radiation oncologist.  Chief Complaint: Fall.  HPI: Wendy Hopkins is a 81 y.o. female with history of chronic atrial fibrillation, chronic diastolic CHF, diabetes mellitus type 2 was brought to the ER after patient had a fall at her home. Patient states she was trying to turn back when she suddenly missed her balance and fell. Denies any loss of consciousness or hitting her head. Denies any palpitations chest pain or shortness of breath. After the fall patient had persistent pain in her pelvic area and CT done in the ER shows pelvic fracture. On-call orthopedic surgeon Dr. [name] was consulted by ER physician. Patient will be admitted for further pain management and possible rehabilitation placement.   Review of Systems: As presented in the history of presenting illness, rest negative.  Past Medical History  Diagnosis Date  . Arthritis   . Hyperlipidemia   . 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 Closed fracture of unspecified part of femur 2008  . TIA (transient ischemic attack)   . Edema   . Chronic diastolic heart failure ([place])   . Osteoporosis with fracture     compression fx lower T spine and t4, L rib  . Thyroid nodule     dominate R nodule, s/p aspiration>abn path>6 mo obs planned ([facility] 06/01/11)  . GERD (gastroesophageal reflux disease)   . Hypertension 2006  . Atrial fibrillation (HCC)     chronic anticoag; and AFlutter  . History of radiation therapy 11/30/2012-12/09/2012    50 gray to right chest  . Vertebral compression fracture [facility])     Thoracic 12 and Thoracic 7  . Adenocarcinoma, lung ([place]) dx 11/08/12    s/p CT chest bx - XRT thru 12/2012  . Diabetes mellitus, type II, insulin dependent ([place])     over 40 years ago   Past Surgical History  Procedure  Laterality Date  . Right femur  06/25/07    Intramedullary open reduction internal fixation  . Vitrectomy  2007    right eye with cataract repair - dr. Cordelia Pen  . Tonsillectomy  1947   Social History:  reports that she has never smoked. She has never used smokeless tobacco. She reports that she does not drink alcohol or use illicit drugs. Where does patient live home. Can patient participate in ADLs? Yes.  Allergies  Allergen Reactions  . Codeine Nausea And Vomiting         Family History:  Family History  Problem Relation Age of Onset  . Hypertension Father     Parent not sure which 1  . Diabetes Brother   . Lung cancer Other   . Cancer Sister   . Parkinsonism Sister       Prior to Admission medications   Medication Sig Start Date End Date Taking? Authorizing Provider  atorvastatin (LIPITOR) 10 MG tablet Take 10 mg by mouth daily.  10/23/14  Yes Historical Provider, MD  cholecalciferol (VITAMIN D) 1000 UNITS tablet Take 1,000 Units by mouth daily.    Yes Historical Provider, MD  furosemide (LASIX) 40 MG tablet TAKE 2 TABLETS BY MOUTH EVERY DAY 01/01/15  Yes Deboraha Sprang, MD  Insulin Glargine (TOUJEO SOLOSTAR [place]) Inject 5 Units into the skin daily.    Yes Historical Provider, MD  insulin lispro (HUMALOG) 100 UNIT/ML injection Inject 3-4 Units into the skin  3 (three) times daily with meals. Depending on sugar level.   Yes Historical Provider, MD  spironolactone (ALDACTONE) 25 MG tablet Take 25 mg by mouth daily.  04/10/15  Yes Historical Provider, MD  warfarin (COUMADIN) 5 MG tablet Take as instructed by the anticoagulation clinic Patient taking differently: Take 2.5-5 mg by mouth daily. Take 1 tablet (5 mg) daily except for Fridays Take 0.5 tablet (2.5 mg) 02/13/15  Yes Rowe Clack, MD  ACCU-CHEK SMARTVIEW test strip Use test strips to check blood sugar at least 3 times a day. 08/11/14   Historical Provider, MD  acetaminophen (TYLENOL) 500 MG tablet Take 500 mg by mouth every  6 (six) hours as needed.    Historical Provider, MD  Insulin Syringe-Needle U-100 30G X 5/16" 1 ML MISC by Does not apply route.    Historical Provider, MD  warfarin (COUMADIN) 5 MG tablet TAKE AS DIRECTED BY ANTICOAGULATION CLINIC Patient not taking: Reported on 05/13/2015 02/13/15   Rowe Clack, MD    Physical Exam: Filed Vitals:   05/13/15 2230 05/13/15 2245 05/14/15 0249 05/14/15 0351  BP:   124/48 94/58  Pulse: 90 83 86 95  Temp:      TempSrc:      Resp:   16 19  SpO2: 100% 100% 100% 99%     General:  Moderately built and nourished.  Eyes: Anicteric no pallor.  ENT: No discharge from the ears eyes nose and mouth.  Neck: No mass felt.  Cardiovascular: S1-S2 heard.  Respiratory: No rhonchi or crepitations.  Abdomen: Soft nontender bowel sounds present.  Skin: No rash.  Musculoskeletal: No edema. Pain on moving lower extremities.  Psychiatric: Appears normal.  Neurologic: Alert awake oriented to time place and person. Moves all extremities.  Labs on Admission:  Basic Metabolic Panel:  Recent Labs Lab 05/13/15 2349 05/14/15 0112  NA 140 140  K 3.2* 3.1*  CL 99* 99*  GLUCOSE 91 85  BUN 21* 21*  CREATININE 1.20* 1.20*   Liver Function Tests: No results for input(s): AST, ALT, ALKPHOS, BILITOT, PROT, ALBUMIN in the last 168 hours. No results for input(s): LIPASE, AMYLASE in the last 168 hours. No results for input(s): AMMONIA in the last 168 hours. CBC:  Recent Labs Lab 05/13/15 2327 05/13/15 2349 05/14/15 0112  WBC 13.6*  --   --   NEUTROABS 12.3*  --   --   HGB 11.7* 13.3 13.3  HCT 35.8* 39.0 39.0  MCV 90.4  --   --   PLT 148*  --   --    Cardiac Enzymes: No results for input(s): CKTOTAL, CKMB, CKMBINDEX, TROPONINI in the last 168 hours.  BNP (last 3 results) No results for input(s): BNP in the last 8760 hours.  ProBNP (last 3 results) No results for input(s): PROBNP in the last 8760 hours.  CBG:  Recent Labs Lab 05/14/15 0118   GLUCAP 163*    Radiological Exams on Admission: Ct Head Wo Contrast  05/14/2015   CLINICAL DATA:  [place].  Anticoagulated.  EXAM: CT HEAD WITHOUT CONTRAST  CT CERVICAL SPINE WITHOUT CONTRAST  TECHNIQUE: Multidetector CT imaging of the head and cervical spine was performed following the standard protocol without intravenous contrast. Multiplanar CT image reconstructions of the cervical spine were also generated.  COMPARISON:  None.  FINDINGS: CT HEAD FINDINGS  There is no intracranial hemorrhage or extra-axial fluid collection. There is moderately severe generalized atrophy. There is mild white matter hypodensity consistent with chronic small vessel  disease. Calvarium and skullbase are intact.  CT CERVICAL SPINE FINDINGS  The vertebral column, pedicles and facet articulations are intact. There is no evidence of acute fracture. No acute soft tissue abnormalities are evident.  Mild arthritic changes are evident in the midcervical spine. Severe atlantoaxial degenerative changes are present.  IMPRESSION: 1. Negative for acute intracranial traumatic injury. There is moderately severe generalized atrophy and chronic small vessel disease. 2. Negative for acute cervical spine fracture   Electronically Signed   By: Andreas Newport M.D.   On: 05/14/2015 02:40   Ct Chest W Contrast  05/14/2015   CLINICAL DATA:  Lower back and hip pain after falling.  EXAM: CT CHEST, ABDOMEN, AND PELVIS WITH CONTRAST  TECHNIQUE: Multidetector CT imaging of the chest, abdomen and pelvis was performed following the standard protocol during bolus administration of intravenous contrast.  CONTRAST:  19m OMNIPAQUE IOHEXOL 300 MG/ML  SOLN  COMPARISON:  01/03/2015, 11/30/2014, 04/24/2014.  FINDINGS: CT CHEST FINDINGS  Mediastinum/Nodes: The mediastinum and intrathoracic vascular structures are intact. There is no mediastinal hemorrhage.  Lungs/Pleura: There is no pneumothorax. There is no effusion. There is unchanged right lower lobe collapse  and consolidation.  Musculoskeletal: No acute fracture is evident. There is chronic moderately displaced fracture of the sternum. There is severe thoracic kyphosis with chronic severe compressions of T6-T8, compression of T12, and moderate superior endplate impaction at L3. These are unchanged. Prior kyphoplasties again noted in the thoracic spine. Multiple chronic rib fracture deformities are present.  CT ABDOMEN PELVIS FINDINGS  Hepatobiliary: A few unchanged sub cm hepatic hypodensities are present. There is no hepatic laceration.  Pancreas: Unremarkable  Spleen: Normal except for a small unchanged hypodense focus anteriorly.  Adrenals/Urinary Tract: Adrenals and kidneys are unremarkable. Ureters are unremarkable. Urinary bladder is moderately distended but appears intact.  Stomach/Bowel: Bowel appears intact. No peritoneal free air or blood.  Vascular/Lymphatic: The abdominal aorta is normal in caliber with extensive atherosclerotic calcification.  Reproductive: Unremarkable  Musculoskeletal: There is diastases of the left sacroiliac joint. There are fractures of the left iliac bone adjacent to the sacroiliac joint, minimally displaced. There are moderately displaced left superior and inferior pubic rami fractures. The hips and acetabuli are intact. Pubic symphysis is intact.  IMPRESSION: 1. Negative for acute intrathoracic traumatic injury. 2. There are acute pelvic fractures including the left pubic rami, and left iliac bone adjacent to the SI joint. There is diastases of the left SI joint. 3. No significant hematoma associated with these pelvic fractures. 4. No evidence of bowel or intra-abdominal parenchymal organ injury.   Electronically Signed   By: DAndreas NewportM.D.   On: 05/14/2015 03:00   Ct Cervical Spine Wo Contrast  05/14/2015   CLINICAL DATA:  [place]  Anticoagulated.  EXAM: CT HEAD WITHOUT CONTRAST  CT CERVICAL SPINE WITHOUT CONTRAST  TECHNIQUE: Multidetector CT imaging of the head and  cervical spine was performed following the standard protocol without intravenous contrast. Multiplanar CT image reconstructions of the cervical spine were also generated.  COMPARISON:  None.  FINDINGS: CT HEAD FINDINGS  There is no intracranial hemorrhage or extra-axial fluid collection. There is moderately severe generalized atrophy. There is mild white matter hypodensity consistent with chronic small vessel disease. Calvarium and skullbase are intact.  CT CERVICAL SPINE FINDINGS  The vertebral column, pedicles and facet articulations are intact. There is no evidence of acute fracture. No acute soft tissue abnormalities are evident.  Mild arthritic changes are evident in the midcervical spine. Severe atlantoaxial  degenerative changes are present.  IMPRESSION: 1. Negative for acute intracranial traumatic injury. There is moderately severe generalized atrophy and chronic small vessel disease. 2. Negative for acute cervical spine fracture   Electronically Signed   By: Andreas Newport M.D.   On: 05/14/2015 02:40   Ct Abdomen Pelvis W Contrast  05/14/2015   CLINICAL DATA:  Lower back and hip pain after falling.  EXAM: CT CHEST, ABDOMEN, AND PELVIS WITH CONTRAST  TECHNIQUE: Multidetector CT imaging of the chest, abdomen and pelvis was performed following the standard protocol during bolus administration of intravenous contrast.  CONTRAST:  44m OMNIPAQUE IOHEXOL 300 MG/ML  SOLN  COMPARISON:  01/03/2015, 11/30/2014, 04/24/2014.  FINDINGS: CT CHEST FINDINGS  Mediastinum/Nodes: The mediastinum and intrathoracic vascular structures are intact. There is no mediastinal hemorrhage.  Lungs/Pleura: There is no pneumothorax. There is no effusion. There is unchanged right lower lobe collapse and consolidation.  Musculoskeletal: No acute fracture is evident. There is chronic moderately displaced fracture of the sternum. There is severe thoracic kyphosis with chronic severe compressions of T6-T8, compression of T12, and moderate  superior endplate impaction at L3. These are unchanged. Prior kyphoplasties again noted in the thoracic spine. Multiple chronic rib fracture deformities are present.  CT ABDOMEN PELVIS FINDINGS  Hepatobiliary: A few unchanged sub cm hepatic hypodensities are present. There is no hepatic laceration.  Pancreas: Unremarkable  Spleen: Normal except for a small unchanged hypodense focus anteriorly.  Adrenals/Urinary Tract: Adrenals and kidneys are unremarkable. Ureters are unremarkable. Urinary bladder is moderately distended but appears intact.  Stomach/Bowel: Bowel appears intact. No peritoneal free air or blood.  Vascular/Lymphatic: The abdominal aorta is normal in caliber with extensive atherosclerotic calcification.  Reproductive: Unremarkable  Musculoskeletal: There is diastases of the left sacroiliac joint. There are fractures of the left iliac bone adjacent to the sacroiliac joint, minimally displaced. There are moderately displaced left superior and inferior pubic rami fractures. The hips and acetabuli are intact. Pubic symphysis is intact.  IMPRESSION: 1. Negative for acute intrathoracic traumatic injury. 2. There are acute pelvic fractures including the left pubic rami, and left iliac bone adjacent to the SI joint. There is diastases of the left SI joint. 3. No significant hematoma associated with these pelvic fractures. 4. No evidence of bowel or intra-abdominal parenchymal organ injury.   Electronically Signed   By: DAndreas NewportM.D.   On: 05/14/2015 03:00   Dg Hip Unilat With Pelvis 2-3 Views Left  05/13/2015   CLINICAL DATA:  Fall  EXAM: DG HIP (WITH OR WITHOUT PELVIS) 2-3V LEFT  COMPARISON:  None.  FINDINGS: There are fractures of the left superior and inferior pubic rami with displacement. Bones are osteopenic. The left femoral neck is grossly intact.  IMPRESSION: Osteopenia  Displaced fractures of the left superior and inferior pubic rami.   Electronically Signed   By: AMarybelle KillingsM.D.   On:  05/13/2015 23:38     Assessment/Plan Principal Problem:   Multiple pelvic fractures (HCC) Active Problems:   Diabetes mellitus type 2, insulin dependent [place]   Essential hypertension   Chronic diastolic heart failure (HCC)   Pelvic fracture (HCC)   1. Pelvic fracture status post mechanical fall - Dr. MPercell Millerorthopedic surgeon to see patient in consult. Expect patient to be managed nonsurgically. Continue pain medications. Physical therapy consult. Patient probably may need rehabilitation placement. 2. Diabetes mellitus type 2 - continue long-acting insulin with sliding scale coverage. 3. Chronic atrial fibrillation - chads 2 vasc score is 4 and  patient is on Coumadin. Presently rate controlled. 4. Hypertension - continue home medications. 5. Chronic diastolic CHF last EF measured was 55-60% - appears compensated continue Lasix and spironolactone. 6. History of non-small cell lung cancer status post radiation.  I have reviewed patient's old charts and labs.   DVT Prophylaxis Coumadin.  Code Status: DO NOT RESUSCITATE.  Family Communication: Patient's daughter.  Disposition Plan: Admit to inpatient.    Keyana Guevara N. [facility] Pager [phone].  If 7PM-7AM, please contact night-coverage [email or web address] Password [email or web address] 05/14/2015, 6:07 AM

## 2015-05-14 NOTE — Evaluation (Signed)
Physical Therapy Evaluation Patient Details Name: Wendy Hopkins MRN: [id number] DOB: 1925/10/29 Today's Date: 05/14/2015   History of Present Illness  Wendy Hopkins is a 81 y.o. female with history of chronic atrial fibrillation, chronic diastolic CHF, diabetes mellitus type 2 was brought to the ER after patient had a fall at her home. There are acute pelvic fractures including the left pubic rami, L and left iliac bone adjacent to the SI joint. There is diastases of  Clinical Impression  Patient reluctant to mobilize, gradually able to get patient to attempt sitting after placed on Bed pan. Patient tolerated well with 2 persons moving patient with the bed pad. Patient will benefit from PT to address problems listed in  Note below.    Follow Up Recommendations SNF;Supervision/Assistance - 24 hour    Equipment Recommendations  None recommended by PT    Recommendations for Other Services       Precautions / Restrictions Restrictions Weight Bearing Restrictions: Yes LLE Weight Bearing: Touchdown weight bearing Other Position/Activity Restrictions: per note from Dr. [name]      Mobility  Bed Mobility Overal bed mobility: Needs Assistance;+2 for physical assistance;+ 2 for safety/equipment Bed Mobility: Rolling;Supine to Sit;Sit to Supine Rolling: Min assist         General bed mobility comments: partiaL ROLL TO THE l FOR BED PAN, use of bed pad to slide patient around and assist to sitting then bed pad to move back into bed  Transfers Overall transfer level: Needs assistance Equipment used: Rolling walker (2 wheeled) Transfers: Sit to/from Stand Sit to Stand: Mod assist;+2 safety/equipment         General transfer comment: cues for TDWB on L. Stood at [employer], appeared to not have pain as anticipated.  Ambulation/Gait                Stairs            Wheelchair Mobility    Modified Rankin (Stroke Patients Only)       Balance Overall balance  assessment: Needs assistance;History of Falls Sitting-balance support: Feet supported;Bilateral upper extremity supported Sitting balance-Leahy Scale: Fair     Standing balance support: During functional activity;Bilateral upper extremity supported Standing balance-Leahy Scale: Poor                               Pertinent Vitals/Pain Pain Assessment: Faces Faces Pain Scale: Hurts even more Pain Location: patient guarding and naticipating pain, L posterior pelvis. Pain Descriptors / Indicators: Aching;Discomfort;Grimacing;Guarding    Home Living Family/patient expects to be discharged to:: Skilled nursing facility Living Arrangements: Spouse/significant other             Home Equipment: Walker - 2 wheels      Prior Function Level of Independence: Independent         Comments: balance issues, did not use RW     Hand Dominance        Extremity/Trunk Assessment   Upper Extremity Assessment: Overall WFL for tasks assessed           Lower Extremity Assessment: LLE deficits/detail   LLE Deficits / Details: does not  Move the leg  Cervical / Trunk Assessment: Kyphotic  Communication   Communication: No difficulties  Cognition Arousal/Alertness: Awake/alert Behavior During Therapy: WFL for tasks assessed/performed Overall Cognitive Status: Within Functional Limits for tasks assessed  General Comments      Exercises        Assessment/Plan    PT Assessment Patient needs continued PT services  PT Diagnosis Difficulty walking;Acute pain   PT Problem List Decreased strength;Decreased range of motion;Decreased activity tolerance;Decreased balance;Decreased mobility;Decreased knowledge of precautions;Decreased safety awareness;Decreased knowledge of use of DME;Pain  PT Treatment Interventions DME instruction;Therapeutic activities;Functional mobility training;Therapeutic exercise;Patient/family education   PT Goals  (Current goals can be found in the Care Plan section) Acute Rehab PT Goals Patient Stated Goal: to walk without pain PT Goal Formulation: With patient/family Time For Goal Achievement: 05/28/15 Potential to Achieve Goals: Good    Frequency Min 3X/week   Barriers to discharge        Co-evaluation               End of Session   Activity Tolerance: Patient tolerated treatment well Patient left: in bed;with call bell/phone within reach;with family/visitor present Nurse Communication: Mobility status         Time: 4917-9150 PT Time Calculation (min) (ACUTE ONLY): 45 min   Charges:   PT Evaluation $Initial PT Evaluation Tier I: 1 Procedure PT Treatments $Therapeutic Activity: 23-37 mins   PT G Codes:        Claretha Cooper 05/14/2015, 3:18 PM Tresa Endo PT [phone]

## 2015-05-14 NOTE — ED Notes (Signed)
VS delayed, pt in CT.

## 2015-05-14 NOTE — Progress Notes (Signed)
ANTICOAGULATION CONSULT NOTE - Initial Consult  Pharmacy Consult for warfarin Indication: atrial fibrillation  Allergies  Allergen Reactions  . Codeine Nausea And Vomiting         Patient Measurements: Height: '4\' 10"'$  (147.3 cm) Weight: 97 lb 14.4 oz (44.407 kg) IBW/kg (Calculated) : 40.9 Heparin Dosing Weight:   Vital Signs: Temp: 97.9 F (36.6 C) (10/04 0629) Temp Source: Oral (10/04 0629) BP: 117/53 mmHg (10/04 0629) Pulse Rate: 91 (10/04 0629)  Labs:  Recent Labs  05/13/15 2327 05/13/15 2349 05/14/15 0112  HGB 11.7* 13.3 13.3  HCT 35.8* 39.0 39.0  PLT 148*  --   --   LABPROT 28.0*  --   --   INR 2.67*  --   --   CREATININE  --  1.20* 1.20*    Estimated Creatinine Clearance: 21.7 mL/min (by C-G formula based on Cr of 1.2).   Medical History: Past Medical History  Diagnosis Date  . Arthritis   . Hyperlipidemia   . 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 Closed fracture of unspecified part of femur 2008  . TIA (transient ischemic attack)   . Edema   . Chronic diastolic heart failure ([place])   . Osteoporosis with fracture     compression fx lower T spine and t4, L rib  . Thyroid nodule     dominate R nodule, s/p aspiration>abn path>6 mo obs planned ([facility] 06/01/11)  . GERD (gastroesophageal reflux disease)   . Hypertension 2006  . Atrial fibrillation (HCC)     chronic anticoag; and AFlutter  . History of radiation therapy 11/30/2012-12/09/2012    50 gray to right chest  . Vertebral compression fracture [facility])     Thoracic 12 and Thoracic 7  . Adenocarcinoma, lung ([place]) dx 11/08/12    s/p CT chest bx - XRT thru 12/2012  . Diabetes mellitus, type II, insulin dependent ([place])     over 40 years ago    Medications:  Prescriptions prior to admission  Medication Sig Dispense Refill Last Dose  . atorvastatin (LIPITOR) 10 MG tablet Take 10 mg by mouth daily.   5 05/13/2015 at Unknown time  . cholecalciferol (VITAMIN D) 1000 UNITS tablet  Take 1,000 Units by mouth daily.    05/13/2015 at Unknown time  . furosemide (LASIX) 40 MG tablet TAKE 2 TABLETS BY MOUTH EVERY DAY 60 tablet 7 05/13/2015 at Unknown time  . Insulin Glargine (TOUJEO SOLOSTAR [place]) Inject 5 Units into the skin daily.    05/13/2015 at Unknown time  . insulin lispro (HUMALOG) 100 UNIT/ML injection Inject 3-4 Units into the skin 3 (three) times daily with meals. Depending on sugar level.   05/13/2015 at Unknown time  . spironolactone (ALDACTONE) 25 MG tablet Take 25 mg by mouth daily.   2 05/13/2015 at Unknown time  . warfarin (COUMADIN) 5 MG tablet Take as instructed by the anticoagulation clinic (Patient taking differently: Take 2.5-5 mg by mouth daily. Take 1 tablet (5 mg) daily except for Fridays Take 0.5 tablet (2.5 mg)) 30 tablet 3 05/13/2015 at 1830  . ACCU-CHEK SMARTVIEW test strip Use test strips to check blood sugar at least 3 times a day.  4 Taking  . acetaminophen (TYLENOL) 500 MG tablet Take 500 mg by mouth every 6 (six) hours as needed.   unknown at unknown time  . Insulin Syringe-Needle U-100 30G X 5/16" 1 ML MISC by Does not apply route.   Taking  . warfarin (COUMADIN) 5 MG  tablet TAKE AS DIRECTED BY ANTICOAGULATION CLINIC (Patient not taking: Reported on 05/13/2015) 35 tablet 0 Not Taking at Unknown time   Scheduled:  . atorvastatin  10 mg Oral q1800  . furosemide  80 mg Oral Daily  . insulin aspart  0-9 Units Subcutaneous TID WC  . insulin glargine  5 Units Subcutaneous Daily  . sodium chloride  3 mL Intravenous Q12H  . spironolactone  25 mg Oral Daily  . [START ON 05/17/2015] warfarin  2.5 mg Oral Once per day on Fri  . warfarin  5 mg Oral Once per day on Sun Mon Tue Wed Thu Sat  . Warfarin - Pharmacist Dosing Inpatient   Does not apply q1800    Assessment: Patient on chronic warfarin for afib.  INR on admit is at goal.   Goal of Therapy:  INR 2-3    Plan:  Continue home warfarin dosing Daily INR  Tyler Deis, Shea Stakes [place] 05/14/2015,6:49  AM

## 2015-05-14 NOTE — ED Notes (Signed)
Patient transported to CT 

## 2015-05-14 NOTE — Progress Notes (Signed)
Patient seen and examined. Admission HPI reviewed.  Patient relates no significant pain unless she doesn't moved.   1-Fall: mechanical. Found to have pelvic fracture. PT per ortho recommendation.   2-Pelvic Fracture; pain management. Tramadol PRN. Per daughter patient is very sensitive to pain medications, with fentanyl yesterday in the ED she was confuse, slurred speech. Would try to avoid IV opioids.  Ortho to see patient in consultation.   Hypotension; asymptomatic. Will hold diuretics. Ordered 500 cc bolus. Will repeat CBC. Check ECHO.   Diabetes: continue with lantus.   HTN; SBP soft will hold spironolactone and lasix.   CHF; compensated. Hold lasix and spironolactone due to soft SBP.   Chronic A fib; on coumadin,. Doses per pharmacy.   Acute on CKD; II; mildly increase Cr. Hold diuretics.   History of non-small cell lung cancer status post radiation.  Rondall Radigan. Md.

## 2015-05-14 NOTE — Consult Note (Signed)
ORTHOPAEDIC CONSULTATION  REQUESTING PHYSICIAN: Elmarie Shiley, MD  Chief Complaint: pelvic pain  HPI: Wendy Hopkins is a 81 y.o. female who complains of pelvic pain after a mechanical fall at home on 05/13/2015. Patient states she was trying to turn back when she suddenly missed her balance and fell. Denies any loss of consciousness or hitting her head. Denies any palpitations chest pain or shortness of breath. After the fall patient had persistent pain in her pelvic area and CT done in the ER shows pelvic fractures.  Patient was ambulatory without any assistive devices at baseline.  She live in a single story home with her husband.  Her daughter helps with meal prep and cleaning.  The patient has a history of chronic atrial fibrillation, chronic diastolic CHF, and diabetes mellitus type 2.  Past Medical History  Diagnosis Date  . Arthritis   . Hyperlipidemia   . 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 Closed fracture of unspecified part of femur 2008  . TIA (transient ischemic attack)   . Edema   . Chronic diastolic heart failure ([place])   . Osteoporosis with fracture     compression fx lower T spine and t4, L rib  . Thyroid nodule     dominate R nodule, s/p aspiration>abn path>6 mo obs planned ([place] 06/01/11)  . GERD (gastroesophageal reflux disease)   . Hypertension 2006  . Atrial fibrillation ([facility])     chronic anticoag; and AFlutter  . History of radiation therapy 11/30/2012-12/09/2012    50 gray to right chest  . Vertebral compression fracture [facility])     Thoracic 12 and Thoracic 7  . Adenocarcinoma, lung ([place]) dx 11/08/12    s/p CT chest bx - XRT thru 12/2012  . Diabetes mellitus, type II, insulin dependent ([place])     over 40 years ago   Past Surgical History  Procedure Laterality Date  . Right femur  06/25/07    Intramedullary open reduction internal fixation  . Vitrectomy  2007    right eye with cataract repair - dr. Cordelia Pen  .  Tonsillectomy  1947   Social History   Social History  . Marital Status: Married    Spouse Name: N/A  . Number of Children: 4  . Years of Education: N/A   Occupational History  . retired     Insurance account manager   Social History Main Topics  . Smoking status: Never Smoker   . Smokeless tobacco: Never Used  . Alcohol Use: No  . Drug Use: No  . Sexual Activity: Not Currently   Other Topics Concern  . None   Social History Narrative   Family History  Problem Relation Age of Onset  . Hypertension Father     Parent not sure which 1  . Diabetes Brother   . Lung cancer Other   . Cancer Sister   . Parkinsonism Sister    Allergies  Allergen Reactions  . Codeine Nausea And Vomiting        Prior to Admission medications   Medication Sig Start Date End Date Taking? Authorizing Provider  atorvastatin (LIPITOR) 10 MG tablet Take 10 mg by mouth daily.  10/23/14  Yes Historical Provider, MD  cholecalciferol (VITAMIN D) 1000 UNITS tablet Take 1,000 Units by mouth daily.    Yes Historical Provider, MD  furosemide (LASIX) 40 MG tablet TAKE 2 TABLETS BY MOUTH EVERY DAY 01/01/15  Yes Deboraha Sprang, MD  Insulin Glargine (TOUJEO  SOLOSTAR [place]) Inject 5 Units into the skin daily.    Yes Historical Provider, MD  insulin lispro (HUMALOG) 100 UNIT/ML injection Inject 3-4 Units into the skin 3 (three) times daily with meals. Depending on sugar level.   Yes Historical Provider, MD  spironolactone (ALDACTONE) 25 MG tablet Take 25 mg by mouth daily.  04/10/15  Yes Historical Provider, MD  warfarin (COUMADIN) 5 MG tablet Take as instructed by the anticoagulation clinic Patient taking differently: Take 2.5-5 mg by mouth daily. Take 1 tablet (5 mg) daily except for Fridays Take 0.5 tablet (2.5 mg) 02/13/15  Yes Rowe Clack, MD  ACCU-CHEK SMARTVIEW test strip Use test strips to check blood sugar at least 3 times a day. 08/11/14   Historical Provider, MD  acetaminophen (TYLENOL) 500 MG tablet Take 500 mg by mouth  every 6 (six) hours as needed.    Historical Provider, MD  Insulin Syringe-Needle U-100 30G X 5/16" 1 ML MISC by Does not apply route.    Historical Provider, MD  warfarin (COUMADIN) 5 MG tablet TAKE AS DIRECTED BY ANTICOAGULATION CLINIC Patient not taking: Reported on 05/13/2015 02/13/15   Rowe Clack, MD   Ct Head Wo Contrast  05/14/2015   CLINICAL DATA:  [place].  Anticoagulated.  EXAM: CT HEAD WITHOUT CONTRAST  CT CERVICAL SPINE WITHOUT CONTRAST  TECHNIQUE: Multidetector CT imaging of the head and cervical spine was performed following the standard protocol without intravenous contrast. Multiplanar CT image reconstructions of the cervical spine were also generated.  COMPARISON:  None.  FINDINGS: CT HEAD FINDINGS  There is no intracranial hemorrhage or extra-axial fluid collection. There is moderately severe generalized atrophy. There is mild white matter hypodensity consistent with chronic small vessel disease. Calvarium and skullbase are intact.  CT CERVICAL SPINE FINDINGS  The vertebral column, pedicles and facet articulations are intact. There is no evidence of acute fracture. No acute soft tissue abnormalities are evident.  Mild arthritic changes are evident in the midcervical spine. Severe atlantoaxial degenerative changes are present.  IMPRESSION: 1. Negative for acute intracranial traumatic injury. There is moderately severe generalized atrophy and chronic small vessel disease. 2. Negative for acute cervical spine fracture   Electronically Signed   By: Andreas Newport M.D.   On: 05/14/2015 02:40   Ct Chest W Contrast  05/14/2015   CLINICAL DATA:  Lower back and hip pain after falling.  EXAM: CT CHEST, ABDOMEN, AND PELVIS WITH CONTRAST  TECHNIQUE: Multidetector CT imaging of the chest, abdomen and pelvis was performed following the standard protocol during bolus administration of intravenous contrast.  CONTRAST:  20m OMNIPAQUE IOHEXOL 300 MG/ML  SOLN  COMPARISON:  01/03/2015, 11/30/2014,  04/24/2014.  FINDINGS: CT CHEST FINDINGS  Mediastinum/Nodes: The mediastinum and intrathoracic vascular structures are intact. There is no mediastinal hemorrhage.  Lungs/Pleura: There is no pneumothorax. There is no effusion. There is unchanged right lower lobe collapse and consolidation.  Musculoskeletal: No acute fracture is evident. There is chronic moderately displaced fracture of the sternum. There is severe thoracic kyphosis with chronic severe compressions of T6-T8, compression of T12, and moderate superior endplate impaction at L3. These are unchanged. Prior kyphoplasties again noted in the thoracic spine. Multiple chronic rib fracture deformities are present.  CT ABDOMEN PELVIS FINDINGS  Hepatobiliary: A few unchanged sub cm hepatic hypodensities are present. There is no hepatic laceration.  Pancreas: Unremarkable  Spleen: Normal except for a small unchanged hypodense focus anteriorly.  Adrenals/Urinary Tract: Adrenals and kidneys are unremarkable. Ureters are unremarkable. Urinary  bladder is moderately distended but appears intact.  Stomach/Bowel: Bowel appears intact. No peritoneal free air or blood.  Vascular/Lymphatic: The abdominal aorta is normal in caliber with extensive atherosclerotic calcification.  Reproductive: Unremarkable  Musculoskeletal: There is diastases of the left sacroiliac joint. There are fractures of the left iliac bone adjacent to the sacroiliac joint, minimally displaced. There are moderately displaced left superior and inferior pubic rami fractures. The hips and acetabuli are intact. Pubic symphysis is intact.  IMPRESSION: 1. Negative for acute intrathoracic traumatic injury. 2. There are acute pelvic fractures including the left pubic rami, and left iliac bone adjacent to the SI joint. There is diastases of the left SI joint. 3. No significant hematoma associated with these pelvic fractures. 4. No evidence of bowel or intra-abdominal parenchymal organ injury.   Electronically  Signed   By: Andreas Newport M.D.   On: 05/14/2015 03:00   Ct Cervical Spine Wo Contrast  05/14/2015   CLINICAL DATA:  [place].  Anticoagulated.  EXAM: CT HEAD WITHOUT CONTRAST  CT CERVICAL SPINE WITHOUT CONTRAST  TECHNIQUE: Multidetector CT imaging of the head and cervical spine was performed following the standard protocol without intravenous contrast. Multiplanar CT image reconstructions of the cervical spine were also generated.  COMPARISON:  None.  FINDINGS: CT HEAD FINDINGS  There is no intracranial hemorrhage or extra-axial fluid collection. There is moderately severe generalized atrophy. There is mild white matter hypodensity consistent with chronic small vessel disease. Calvarium and skullbase are intact.  CT CERVICAL SPINE FINDINGS  The vertebral column, pedicles and facet articulations are intact. There is no evidence of acute fracture. No acute soft tissue abnormalities are evident.  Mild arthritic changes are evident in the midcervical spine. Severe atlantoaxial degenerative changes are present.  IMPRESSION: 1. Negative for acute intracranial traumatic injury. There is moderately severe generalized atrophy and chronic small vessel disease. 2. Negative for acute cervical spine fracture   Electronically Signed   By: Andreas Newport M.D.   On: 05/14/2015 02:40   Ct Abdomen Pelvis W Contrast  05/14/2015   CLINICAL DATA:  Lower back and hip pain after falling.  EXAM: CT CHEST, ABDOMEN, AND PELVIS WITH CONTRAST  TECHNIQUE: Multidetector CT imaging of the chest, abdomen and pelvis was performed following the standard protocol during bolus administration of intravenous contrast.  CONTRAST:  25m OMNIPAQUE IOHEXOL 300 MG/ML  SOLN  COMPARISON:  01/03/2015, 11/30/2014, 04/24/2014.  FINDINGS: CT CHEST FINDINGS  Mediastinum/Nodes: The mediastinum and intrathoracic vascular structures are intact. There is no mediastinal hemorrhage.  Lungs/Pleura: There is no pneumothorax. There is no effusion. There is  unchanged right lower lobe collapse and consolidation.  Musculoskeletal: No acute fracture is evident. There is chronic moderately displaced fracture of the sternum. There is severe thoracic kyphosis with chronic severe compressions of T6-T8, compression of T12, and moderate superior endplate impaction at L3. These are unchanged. Prior kyphoplasties again noted in the thoracic spine. Multiple chronic rib fracture deformities are present.  CT ABDOMEN PELVIS FINDINGS  Hepatobiliary: A few unchanged sub cm hepatic hypodensities are present. There is no hepatic laceration.  Pancreas: Unremarkable  Spleen: Normal except for a small unchanged hypodense focus anteriorly.  Adrenals/Urinary Tract: Adrenals and kidneys are unremarkable. Ureters are unremarkable. Urinary bladder is moderately distended but appears intact.  Stomach/Bowel: Bowel appears intact. No peritoneal free air or blood.  Vascular/Lymphatic: The abdominal aorta is normal in caliber with extensive atherosclerotic calcification.  Reproductive: Unremarkable  Musculoskeletal: There is diastases of the left sacroiliac joint. There are  fractures of the left iliac bone adjacent to the sacroiliac joint, minimally displaced. There are moderately displaced left superior and inferior pubic rami fractures. The hips and acetabuli are intact. Pubic symphysis is intact.  IMPRESSION: 1. Negative for acute intrathoracic traumatic injury. 2. There are acute pelvic fractures including the left pubic rami, and left iliac bone adjacent to the SI joint. There is diastases of the left SI joint. 3. No significant hematoma associated with these pelvic fractures. 4. No evidence of bowel or intra-abdominal parenchymal organ injury.   Electronically Signed   By: Andreas Newport M.D.   On: 05/14/2015 03:00   Dg Hip Unilat With Pelvis 2-3 Views Left  05/13/2015   CLINICAL DATA:  Fall  EXAM: DG HIP (WITH OR WITHOUT PELVIS) 2-3V LEFT  COMPARISON:  None.  FINDINGS: There are  fractures of the left superior and inferior pubic rami with displacement. Bones are osteopenic. The left femoral neck is grossly intact.  IMPRESSION: Osteopenia  Displaced fractures of the left superior and inferior pubic rami.   Electronically Signed   By: Marybelle Killings M.D.   On: 05/13/2015 23:38    Positive ROS: All other systems have been reviewed and were otherwise negative with the exception of those mentioned in the HPI and as above.  Labs cbc  Recent Labs  05/13/15 2327  05/14/15 0112 05/14/15 0850  WBC 13.6*  --   --  9.9  HGB 11.7*  < > 13.3 11.3*  HCT 35.8*  < > 39.0 34.8*  PLT 148*  --   --  127*  < > = values in this interval not displayed.  Labs inflam No results for input(s): CRP in the last 72 hours.  Invalid input(s): ESR  Labs coag  Recent Labs  05/13/15 2327 05/14/15 0909  INR 2.67* 2.92*     Recent Labs  05/14/15 0112 05/14/15 0850  NA 140 134*  K 3.1* 4.8  CL 99* 96*  CO2  --  27  GLUCOSE 85 363*  BUN 21* 24*  CREATININE 1.20* 1.51*  CALCIUM  --  8.5*    Physical Exam: Filed Vitals:   05/14/15 1452  BP: 89/46  Pulse: 86  Temp: 97.9 F (36.6 C)  Resp: 20   General: Alert, no acute distress Cardiovascular: No pedal edema Respiratory: No cyanosis, no use of accessory musculature GI: No organomegaly, abdomen is soft and non-tender Skin: No lesions in the area of chief complaint other than those listed below in MSK exam.  Neurologic: Sensation intact distally Psychiatric: Patient is competent for consent with normal mood and affect Lymphatic: No axillary or cervical lymphadenopathy  MUSCULOSKELETAL:  L hip has no erythema or warmth.  L pelvis is tender to palpation diffusely.  ROM of the hip limited due to pain.  Sensation is intact with 2+ distal pulses.   Other extremities are atraumatic with painless ROM and NVI.  Assessment: L pubic rami and iliac fractures after a mechanical fall at home on 05/13/2015  Plan: I have discussed  the results of the CT scan with the patient and her daughter.  Recommend non-surgical treatment of the L pubic rami and iliac fractures.  Will need to protect her weight bearing with touch down weight bearing on the LLE.  Will set up PT eval.  Likely will need rehab at discharge to improve mobilization with new weight bearing restrictions.  Will plan to see back in the office in 2 weeks for repeat xrays.  Ok from ortho  standpoint to be transferred to rehab once bed available.   Bland Span Cell [phone]   05/14/2015 5:16 PM

## 2015-05-14 NOTE — Clinical Social Work Note (Signed)
Clinical Social Work Assessment  Patient Details  Name: Wendy Hopkins MRN: [id number] Date of Birth: Oct 02, 1925  Date of referral:  05/14/15               Reason for consult:  Discharge Planning, Facility Placement                Permission sought to share information with:  Chartered certified accountant granted to share information::  Yes, Verbal Permission Granted  Name::        Agency::     Relationship::     Contact Information:     Housing/Transportation Living arrangements for the past 2 months:  Single Family Home Source of Information:  Patient, Adult Children Patient Interpreter Needed:  None Criminal Activity/Legal Involvement Pertinent to Current Situation/Hospitalization:  No - Comment as needed Significant Relationships:  Adult Children, Spouse Lives with:  Spouse Do you feel safe going back to the place where you live?   (ST rehab needed.) Need for family participation in patient care:  Yes (Comment)  Care giving concerns:  Pt's care cannot be managed at home following hospital d/c.   Social Worker assessment / plan:  Pt hospitalized on 05/14/15 with pelvic fx. Surgery is not required. CSW met with pt / family to assist with d/c planning. ST Rehab will be needed at d/c. Pt / family are in agreement with this plan. SNF search initiated and bed offers are pending. CSW will assist with d/c planning needs.  Employment status:  Retired Nurse, adult PT Recommendations:  Not assessed at this time Information / Referral to community resources:  [place]  Patient/Family's Response to care:  Pt / family feel ST Rehab is needed.  Patient/Family's Understanding of and Emotional Response to Diagnosis, Current Treatment, and Prognosis:  Pt / family are aware of pt's medical status. " I'm in a lot of pain when I try to move", pt reports. Pt is hoping to start feeling better soon. Support / reassurance  offered.  Emotional Assessment Appearance:  Appears stated age Attitude/Demeanor/Rapport:  Other (cooperative) Affect (typically observed):  Restless, Other (uncomfortable) Orientation:  Oriented to Self, Oriented to Place, Oriented to  Time, Oriented to Situation Alcohol / Substance use:  Not Applicable Psych involvement (Current and /or in the community):  No (Comment)  Discharge Needs  Concerns to be addressed:  Discharge Planning Concerns Readmission within the last 30 days:  No Current discharge risk:  None Barriers to Discharge:  No Barriers Identified   Luretha Rued, [place] 05/14/2015, 3:01 PM

## 2015-05-14 NOTE — Consult Note (Signed)
I have reviewed her imaging and will perform formal consult today.   Tentative plan is for TDWB of the LLE and mobilize with PT.     [name], [place] D

## 2015-05-15 ENCOUNTER — Encounter (HOSPITAL_COMMUNITY): Payer: Self-pay | Admitting: Internal Medicine

## 2015-05-15 ENCOUNTER — Observation Stay (HOSPITAL_BASED_OUTPATIENT_CLINIC_OR_DEPARTMENT_OTHER): Payer: Medicare Other

## 2015-05-15 ENCOUNTER — Ambulatory Visit: Payer: Medicare Other

## 2015-05-15 DIAGNOSIS — G319 Degenerative disease of nervous system, unspecified: Secondary | ICD-10-CM

## 2015-05-15 DIAGNOSIS — I739 Peripheral vascular disease, unspecified: Secondary | ICD-10-CM | POA: Diagnosis present

## 2015-05-15 DIAGNOSIS — E785 Hyperlipidemia, unspecified: Secondary | ICD-10-CM | POA: Diagnosis not present

## 2015-05-15 DIAGNOSIS — N179 Acute kidney failure, unspecified: Secondary | ICD-10-CM | POA: Diagnosis not present

## 2015-05-15 DIAGNOSIS — Z7901 Long term (current) use of anticoagulants: Secondary | ICD-10-CM

## 2015-05-15 DIAGNOSIS — I110 Hypertensive heart disease with heart failure: Secondary | ICD-10-CM | POA: Diagnosis not present

## 2015-05-15 DIAGNOSIS — N19 Unspecified kidney failure: Secondary | ICD-10-CM | POA: Diagnosis not present

## 2015-05-15 DIAGNOSIS — R0602 Shortness of breath: Secondary | ICD-10-CM | POA: Diagnosis not present

## 2015-05-15 DIAGNOSIS — Z923 Personal history of irradiation: Secondary | ICD-10-CM | POA: Diagnosis not present

## 2015-05-15 DIAGNOSIS — K5900 Constipation, unspecified: Secondary | ICD-10-CM

## 2015-05-15 DIAGNOSIS — E041 Nontoxic single thyroid nodule: Secondary | ICD-10-CM | POA: Diagnosis not present

## 2015-05-15 DIAGNOSIS — I9589 Other hypotension: Secondary | ICD-10-CM

## 2015-05-15 DIAGNOSIS — S3282XD Multiple fractures of pelvis without disruption of pelvic ring, subsequent encounter for fracture with routine healing: Secondary | ICD-10-CM | POA: Diagnosis not present

## 2015-05-15 DIAGNOSIS — I482 Chronic atrial fibrillation: Secondary | ICD-10-CM

## 2015-05-15 DIAGNOSIS — M545 Low back pain: Secondary | ICD-10-CM | POA: Diagnosis not present

## 2015-05-15 DIAGNOSIS — M1990 Unspecified osteoarthritis, unspecified site: Secondary | ICD-10-CM | POA: Diagnosis not present

## 2015-05-15 DIAGNOSIS — E871 Hypo-osmolality and hyponatremia: Secondary | ICD-10-CM | POA: Diagnosis not present

## 2015-05-15 DIAGNOSIS — E875 Hyperkalemia: Secondary | ICD-10-CM | POA: Diagnosis not present

## 2015-05-15 DIAGNOSIS — I4891 Unspecified atrial fibrillation: Secondary | ICD-10-CM | POA: Diagnosis not present

## 2015-05-15 DIAGNOSIS — I071 Rheumatic tricuspid insufficiency: Secondary | ICD-10-CM | POA: Diagnosis not present

## 2015-05-15 DIAGNOSIS — I272 Other secondary pulmonary hypertension: Secondary | ICD-10-CM | POA: Diagnosis not present

## 2015-05-15 DIAGNOSIS — S329XXA Fracture of unspecified parts of lumbosacral spine and pelvis, initial encounter for closed fracture: Secondary | ICD-10-CM | POA: Diagnosis not present

## 2015-05-15 DIAGNOSIS — I5030 Unspecified diastolic (congestive) heart failure: Secondary | ICD-10-CM | POA: Diagnosis not present

## 2015-05-15 DIAGNOSIS — E11649 Type 2 diabetes mellitus with hypoglycemia without coma: Secondary | ICD-10-CM | POA: Diagnosis not present

## 2015-05-15 DIAGNOSIS — I5032 Chronic diastolic (congestive) heart failure: Secondary | ICD-10-CM | POA: Diagnosis not present

## 2015-05-15 DIAGNOSIS — E873 Alkalosis: Secondary | ICD-10-CM | POA: Diagnosis not present

## 2015-05-15 DIAGNOSIS — S32592D Other specified fracture of left pubis, subsequent encounter for fracture with routine healing: Secondary | ICD-10-CM | POA: Diagnosis not present

## 2015-05-15 DIAGNOSIS — S32810D Multiple fractures of pelvis with stable disruption of pelvic ring, subsequent encounter for fracture with routine healing: Secondary | ICD-10-CM

## 2015-05-15 DIAGNOSIS — I10 Essential (primary) hypertension: Secondary | ICD-10-CM

## 2015-05-15 DIAGNOSIS — D62 Acute posthemorrhagic anemia: Secondary | ICD-10-CM | POA: Diagnosis not present

## 2015-05-15 DIAGNOSIS — I509 Heart failure, unspecified: Secondary | ICD-10-CM | POA: Diagnosis not present

## 2015-05-15 DIAGNOSIS — M4692 Unspecified inflammatory spondylopathy, cervical region: Secondary | ICD-10-CM | POA: Diagnosis not present

## 2015-05-15 DIAGNOSIS — R259 Unspecified abnormal involuntary movements: Secondary | ICD-10-CM | POA: Diagnosis not present

## 2015-05-15 DIAGNOSIS — Z85118 Personal history of other malignant neoplasm of bronchus and lung: Secondary | ICD-10-CM | POA: Diagnosis not present

## 2015-05-15 DIAGNOSIS — Z79899 Other long term (current) drug therapy: Secondary | ICD-10-CM | POA: Diagnosis not present

## 2015-05-15 DIAGNOSIS — E119 Type 2 diabetes mellitus without complications: Secondary | ICD-10-CM

## 2015-05-15 DIAGNOSIS — S32502A Unspecified fracture of left pubis, initial encounter for closed fracture: Secondary | ICD-10-CM | POA: Diagnosis not present

## 2015-05-15 DIAGNOSIS — Y92009 Unspecified place in unspecified non-institutional (private) residence as the place of occurrence of the external cause: Secondary | ICD-10-CM | POA: Diagnosis not present

## 2015-05-15 DIAGNOSIS — L8990 Pressure ulcer of unspecified site, unspecified stage: Secondary | ICD-10-CM | POA: Diagnosis present

## 2015-05-15 DIAGNOSIS — I481 Persistent atrial fibrillation: Secondary | ICD-10-CM | POA: Diagnosis not present

## 2015-05-15 DIAGNOSIS — I5033 Acute on chronic diastolic (congestive) heart failure: Secondary | ICD-10-CM | POA: Diagnosis not present

## 2015-05-15 DIAGNOSIS — N170 Acute kidney failure with tubular necrosis: Secondary | ICD-10-CM | POA: Diagnosis not present

## 2015-05-15 DIAGNOSIS — Z794 Long term (current) use of insulin: Secondary | ICD-10-CM | POA: Diagnosis not present

## 2015-05-15 DIAGNOSIS — I6789 Other cerebrovascular disease: Secondary | ICD-10-CM | POA: Diagnosis not present

## 2015-05-15 DIAGNOSIS — M810 Age-related osteoporosis without current pathological fracture: Secondary | ICD-10-CM | POA: Diagnosis not present

## 2015-05-15 DIAGNOSIS — Z66 Do not resuscitate: Secondary | ICD-10-CM | POA: Diagnosis not present

## 2015-05-15 DIAGNOSIS — S32302A Unspecified fracture of left ilium, initial encounter for closed fracture: Secondary | ICD-10-CM | POA: Diagnosis not present

## 2015-05-15 DIAGNOSIS — W19XXXD Unspecified fall, subsequent encounter: Secondary | ICD-10-CM | POA: Diagnosis present

## 2015-05-15 DIAGNOSIS — K219 Gastro-esophageal reflux disease without esophagitis: Secondary | ICD-10-CM | POA: Diagnosis not present

## 2015-05-15 DIAGNOSIS — R339 Retention of urine, unspecified: Secondary | ICD-10-CM | POA: Diagnosis not present

## 2015-05-15 DIAGNOSIS — J90 Pleural effusion, not elsewhere classified: Secondary | ICD-10-CM | POA: Diagnosis not present

## 2015-05-15 DIAGNOSIS — F4489 Other dissociative and conversion disorders: Secondary | ICD-10-CM | POA: Diagnosis not present

## 2015-05-15 DIAGNOSIS — I679 Cerebrovascular disease, unspecified: Secondary | ICD-10-CM | POA: Insufficient documentation

## 2015-05-15 DIAGNOSIS — D500 Iron deficiency anemia secondary to blood loss (chronic): Secondary | ICD-10-CM | POA: Diagnosis not present

## 2015-05-15 DIAGNOSIS — R609 Edema, unspecified: Secondary | ICD-10-CM | POA: Diagnosis not present

## 2015-05-15 DIAGNOSIS — R531 Weakness: Secondary | ICD-10-CM | POA: Diagnosis not present

## 2015-05-15 DIAGNOSIS — Z8673 Personal history of transient ischemic attack (TIA), and cerebral infarction without residual deficits: Secondary | ICD-10-CM | POA: Diagnosis not present

## 2015-05-15 HISTORY — DX: Cerebrovascular disease, unspecified: I67.9

## 2015-05-15 HISTORY — DX: Rheumatic tricuspid insufficiency: I07.1

## 2015-05-15 HISTORY — DX: Pulmonary hypertension, unspecified: I27.20

## 2015-05-15 HISTORY — DX: Degenerative disease of nervous system, unspecified: G31.9

## 2015-05-15 LAB — BASIC METABOLIC PANEL
ANION GAP: 9 (ref 5–15)
BUN: 37 mg/dL — ABNORMAL HIGH (ref 6–20)
CHLORIDE: 92 mmol/L — AB (ref 101–111)
CO2: 26 mmol/L (ref 22–32)
Calcium: 8.2 mg/dL — ABNORMAL LOW (ref 8.9–10.3)
Creatinine, Ser: 2.79 mg/dL — ABNORMAL HIGH (ref 0.44–1.00)
GFR calc Af Amer: 17 mL/min — ABNORMAL LOW (ref >60)
GFR, EST NON AFRICAN AMERICAN: 14 mL/min — AB (ref >60)
GLUCOSE: 404 mg/dL — AB (ref 65–99)
POTASSIUM: 6.2 mmol/L — AB (ref 3.5–5.1)
SODIUM: 127 mmol/L — AB (ref 135–145)

## 2015-05-15 LAB — CBC
HCT: 31.3 % — ABNORMAL LOW (ref 36.0–46.0)
HEMOGLOBIN: 10.3 g/dL — AB (ref 12.0–15.0)
MCH: 30.5 pg (ref 26.0–34.0)
MCHC: 32.9 g/dL (ref 30.0–36.0)
MCV: 92.6 fL (ref 78.0–100.0)
PLATELETS: 112 10*3/uL — AB (ref 150–400)
RBC: 3.38 MIL/uL — AB (ref 3.87–5.11)
RDW: 15.5 % (ref 11.5–15.5)
WBC: 8.2 10*3/uL (ref 4.0–10.5)

## 2015-05-15 LAB — GLUCOSE, CAPILLARY
GLUCOSE-CAPILLARY: 220 mg/dL — AB (ref 65–99)
Glucose-Capillary: 390 mg/dL — ABNORMAL HIGH (ref 65–99)

## 2015-05-15 LAB — POTASSIUM: POTASSIUM: 5.5 mmol/L — AB (ref 3.5–5.1)

## 2015-05-15 LAB — PROTIME-INR
INR: 5.48 (ref 0.00–1.49)
Prothrombin Time: 48.2 seconds — ABNORMAL HIGH (ref 11.6–15.2)

## 2015-05-15 MED ORDER — BISACODYL 5 MG PO TBEC: 5.0000 mg | DELAYED_RELEASE_TABLET | Freq: Every day | ORAL | Status: DC | PRN

## 2015-05-15 MED ORDER — POLYETHYLENE GLYCOL 3350 17 G PO PACK: 17.0000 g | PACK | Freq: Every day | ORAL | Status: DC

## 2015-05-15 MED ORDER — POLYETHYLENE GLYCOL 3350 17 G PO PACK: 17.0000 g | PACK | Freq: Every day | ORAL | Status: AC

## 2015-05-15 MED ORDER — TRAMADOL HCL 50 MG PO TABS: 50.0000 mg | ORAL_TABLET | Freq: Four times a day (QID) | ORAL | Status: DC | PRN

## 2015-05-15 MED ORDER — SODIUM CHLORIDE 0.9 % IV SOLN
INTRAVENOUS | Status: DC
  Administered 2015-05-15: 75 mL/h via INTRAVENOUS

## 2015-05-15 MED ORDER — INSULIN GLARGINE 300 UNIT/ML ~~LOC~~ SOPN
10.0000 [IU] | PEN_INJECTOR | Freq: Every day | SUBCUTANEOUS | Status: DC
Start: 2015-05-15 — End: 2015-05-31

## 2015-05-15 MED ORDER — ONDANSETRON HCL 4 MG PO TABS: 4.0000 mg | ORAL_TABLET | Freq: Four times a day (QID) | ORAL | Status: DC | PRN

## 2015-05-15 NOTE — Evaluation (Signed)
Occupational Therapy Evaluation Patient Details Name: Wendy Hopkins MRN: [id number] DOB: 06-30-1926 Today's Date: 05/15/2015    History of Present Illness Wendy Hopkins is a 81 y.o. female with history of chronic atrial fibrillation, chronic diastolic CHF, diabetes mellitus type 2 was brought to the ER after patient had a fall at her home. There are acute pelvic fractures including the left pubic rami, L and left iliac bone adjacent to the SI joint.    Clinical Impression   Patient presenting with decreased ADL and functional mobility independence. Patient independent and living with husband PTA. Patient currently functioning at up to a max assist level (needing max assist for LB ADLs) and supervision to min assist for UB ADLs, pt mod assist for stand pivot transfers EOB to and from [facility]. Patient will benefit from acute OT to increase overall independence in the areas of ADLs, functional mobility, and overall safety in order to safely discharge to venue listed below.     Follow Up Recommendations  SNF;Supervision/Assistance - 24 hour    Equipment Recommendations  Other (comment) (TBD next venue of care)    Recommendations for Other Services  None at this time   Precautions / Restrictions Precautions Precautions: Fall Restrictions Weight Bearing Restrictions: Yes LLE Weight Bearing: Touchdown weight bearing    Mobility Bed Mobility Overal bed mobility: Needs Assistance Bed Mobility: Supine to Sit;Sit to Supine     Supine to sit: Mod assist Sit to supine: Mod assist   General bed mobility comments: Mod assist for management of BLEs. Cues for safety, pain management control, technique.   Transfers Overall transfer level: Needs assistance Equipment used: Rolling walker (2 wheeled) Transfers: Sit to/from [facility] Sit to Stand: Min assist Stand pivot transfers: Mod assist       General transfer comment: Min assist for sit<>stand for safety, mod assist  for safety with stand pivot. Cues for technique, TDWB, and hand placement.     Balance Overall balance assessment: Needs assistance Sitting-balance support: No upper extremity supported;Feet supported Sitting balance-Leahy Scale: Fair     Standing balance support: Bilateral upper extremity supported;During functional activity Standing balance-Leahy Scale: Fair    ADL Overall ADL's : Needs assistance/impaired Eating/Feeding: Set up;Bed level   Grooming: Set up;Bed level   Upper Body Bathing: Set up;Sitting   Lower Body Bathing: Sit to/from stand;Moderate assistance;Cueing for safety   Upper Body Dressing : Set up;Sitting   Lower Body Dressing: Maximal assistance;Sit to/from stand;Cueing for safety   Toilet Transfer: Moderate assistance;RW;Stand-pivot;Cueing for safety;BSC   Toileting- Clothing Manipulation and Hygiene: Supervision/safety;Sitting/lateral lean     Tub/Shower Transfer Details (indicate cue type and reason): did not occur Functional mobility during ADLs: Moderate assistance;Rolling walker;Cueing for safety General ADL Comments: Pt with request to use BR, therapist encouraged use of BSC and pt willing. Pt engaged in bed mobility with mod assist (HOB elevated & use of bed rails). Pt sat EOB, then transferred EOB>BSC with mod assist. Cues needed for TDWB, pt tended to hold up LLE and hop on RLE. Daughter present towards end of session and supportive of patient, giving cues for safety and mobility. Assisted pt back to bed.     Pertinent Vitals/Pain Pain Assessment: Faces Faces Pain Scale: Hurts whole lot Pain Location: left hip Pain Descriptors / Indicators: Aching;Guarding;Grimacing;Sore Pain Intervention(s): Limited activity within patient's tolerance;Repositioned;Monitored during session     Hand Dominance Right   Extremity/Trunk Assessment Upper Extremity Assessment Upper Extremity Assessment: LUE deficits/detail LUE Deficits / Details: limited  ROM during  bed mobility tasks, but overall WFL   Lower Extremity Assessment Lower Extremity Assessment: Defer to PT evaluation   Cervical / Trunk Assessment Cervical / Trunk Assessment: Kyphotic   Communication Communication Communication: No difficulties   Cognition Arousal/Alertness: Awake/alert Behavior During Therapy: WFL for tasks assessed/performed Overall Cognitive Status: Within Functional Limits for tasks assessed              Home Living Family/patient expects to be discharged to:: Skilled nursing facility Living Arrangements: Spouse/significant other Additional Comments: Used walk-in shower PTA, pt states she was independent with all  ADLs      Prior Functioning/Environment Level of Independence: Independent        Comments: balance issues, did not use RW    OT Diagnosis: Generalized weakness;Acute pain   OT Problem List: Decreased strength;Decreased range of motion;Decreased activity tolerance;Impaired balance (sitting and/or standing);Decreased safety awareness;Decreased knowledge of use of DME or AE;Decreased knowledge of precautions;Pain   OT Treatment/Interventions: Self-care/ADL training;Therapeutic exercise;Energy conservation;DME and/or AE instruction;Therapeutic activities;Patient/family education;Balance training    OT Goals(Current goals can be found in the care plan section) Acute Rehab OT Goals Patient Stated Goal: decrease pain OT Goal Formulation: With patient/family Time For Goal Achievement: 05/29/15 Potential to Achieve Goals: Good ADL Goals Pt Will Perform Grooming: with modified independence;sitting Pt Will Perform Lower Body Bathing: with supervision;sit to/from stand;with adaptive equipment Pt Will Perform Lower Body Dressing: with supervision;sit to/from stand;with adaptive equipment Pt Will Transfer to Toilet: bedside commode;ambulating;with min guard assist Additional ADL Goal #1: Pt will be supervision with bed mobility as a prerequisite for  ADLs and functional mobility/transfers OOB  OT Frequency: Min 2X/week   Barriers to D/C: Decreased caregiver support  Husband unable to provide the needed assistance post acute d/c   End of Session Equipment Utilized During Treatment: Gait belt;Rolling walker Nurse Communication: Mobility status  Activity Tolerance: Patient tolerated treatment well Patient left: in bed;with call bell/phone within reach;with family/visitor present   Time: 0947-0962 OT Time Calculation (min): 36 min Charges:  OT General Charges $OT Visit: 1 Procedure OT Evaluation $Initial OT Evaluation Tier I: 1 Procedure OT Treatments $Self Care/Home Management : 8-22 mins G-Codes: OT G-codes **NOT FOR INPATIENT CLASS** Functional Limitation: Self care Self Care Current Status (E3662): At least 60 percent but less than 80 percent impaired, limited or restricted Self Care Goal Status (H4765): At least 1 percent but less than 20 percent impaired, limited or restricted  Zaion Hreha , MS, OTR/L, CLT Pager: [phone]  05/15/2015, 11:31 AM

## 2015-05-15 NOTE — Progress Notes (Signed)
JC, PharmD, notified of critical INR.  No new orders at this time.  Nursing will monitor for signs of bleeding.

## 2015-05-15 NOTE — Clinical Social Work Placement (Signed)
   CLINICAL SOCIAL WORK PLACEMENT  NOTE  Date:  05/15/2015  Patient Details  Name: Wendy Hopkins MRN: [id number] Date of Birth: 1926/03/13  Clinical Social Work is seeking post-discharge placement for this patient at the [place] level of care (*CSW will initial, date and re-position this form in  chart as items are completed):  Yes   Patient/family provided with [facility] Work Department's list of facilities offering this level of care within the geographic area requested by the patient (or if unable, by the patient's family).  Yes   Patient/family informed of their freedom to choose among providers that offer the needed level of care, that participate in Medicare, Medicaid or managed care program needed by the patient, have an available bed and are willing to accept the patient.  Yes   Patient/family informed of [facility]'s ownership interest in [facility] and [facility], as well as of the fact that they are under no obligation to receive care at these facilities.  PASRR submitted to EDS on 05/14/15     PASRR number received on 05/14/15     Existing PASRR number confirmed on       FL2 transmitted to all facilities in geographic area requested by pt/family on 05/14/15     FL2 transmitted to all facilities within larger geographic area on       Patient informed that his/her managed care company has contracts with or will negotiate with certain facilities, including the following:        Yes   Patient/family informed of bed offers received.  Patient chooses bed at [facility] [place]     Physician recommends and patient chooses bed at      Patient to be transferred to [facility] on 05/15/15.  Patient to be transferred to facility by [facility]     Patient family notified on 05/15/15 of transfer.  Name of family member notified:  Daughter     PHYSICIAN       Additional Comment: Pt / daughter are in agreement  with d/c to [facility] ( [facility] ) today. [facility] transport is required. Pt / daughter are aware that out of pocket costs may be associated with [facility] transport. NSG reviewed d/c summary, scripts, avs. Scripts included in d/c packet. D/c summary sent to SNF for review prior to d/c.    _______________________________________________ Luretha Rued, [place] 05/15/2015, 4:11 PM

## 2015-05-15 NOTE — Progress Notes (Signed)
Echocardiogram 2D Echocardiogram has been performed.  Wendy Hopkins 05/15/2015, 12:38 PM

## 2015-05-15 NOTE — Discharge Summary (Addendum)
Physician Discharge Summary  Wendy Hopkins FXJ:[id number] DOB: 03-May-1926 DOA: 05/13/2015  PCP: [name], MD  Admit date: 05/13/2015 Discharge date: 05/15/2015   Recommendations for Outpatient Follow-Up:   1. Please re-check potassium level 05/16/15.   2. Recommend close F/U of PT/INR while on coumadin. HOLD today's coumadin dose given elevated INR. 3. Recommend close F/U of glycemic control with adjustment of insulin as needed. 4. Recommend outpatient F/U with cardiology given abnormalities noted on 2 D Echo.   Discharge Diagnosis:   Principal Problem:    Multiple pelvic fractures (HCC) Active Problems:    Diabetes mellitus type 2, insulin dependent ([place])    Essential hypertension    Atrial fibrillation (HCC)    Chronic diastolic heart failure ([place])    Long term current use of anticoagulant    Hyperkalemia    Constipation    Severe tricuspid regurgitation    Severe pulmonary hypertension   Discharge disposition:  SNF: [facility]  Discharge Condition: Improved.  Diet recommendation: Low sodium, heart healthy.  Carbohydrate-modified.    History of Present Illness:   Wendy Hopkins is an 81 y.o. female with a PMH of chronic atrial fibrillation, chronic diastolic CHF, type 2 diabetes who was admitted with a chief complaint of pain in her pelvic area 05/13/15 after sustaining a fall in her home. CT done on admission showed pelvic fracture.  Hospital Course by Problem:   Principal Problem:   Multiple pelvic fractures ([place]) secondary to fall at home - F/U with orthopedic surgeon in 2 weeks.  Non-operative. - PT as tolerated.  D/C to SNF. - Tramadol ordered for pain control.    Active Problems:   Constipation - Bowel regimen started with MiraLAX and PRN dulcolax.    Hyperkalemia - K+ increased in the setting of prior treatment with spironolactone, and supplementation for hypokalemia. - Hold spironolactone.  Repeat K+ went from 6.6--->5.5, so degree  of elevation likely spurious. - 12 lead EKG without peaked T waves. - Repeat K+ in a.m.     Diabetes mellitus type 2, insulin dependent (HCC) - Long acting insulin increased to 10 units, will likely need titration of Lispro as well.    Essential hypertension - On Lasix.  Spironolactone on hold.    Atrial fibrillation ([place]) / Long term current use of anticoagulant - Continue coumadin.      Chronic diastolic heart failure (HCC)/severe tricuspid regurgitation / severe pulmonary hypertension - Clinically compensated. - 2 D Echo as noted, severe tricuspid regurgitation/pulmonary HTN. - Recommend outpatient cardiology F/U.   Medical Consultants:    Dr. [name], Orthopedics   Discharge Exam:   Filed Vitals:   05/15/15 0524  BP: 124/57  Pulse: 97  Temp: 97.7 F (36.5 C)  Resp: 16   Filed Vitals:   05/14/15 1452 05/14/15 1843 05/14/15 2200 05/15/15 0524  BP: '89/46 93/47 98/49 '$ 124/57  Pulse: 86 85 86 97  Temp: 97.9 F (36.6 C) 97.5 F (36.4 C) 97.6 F (36.4 C) 97.7 F (36.5 C)  TempSrc: Oral Oral Oral Oral  Resp: '20 18 16 16  '$ Height:      Weight:    44.897 kg (98 lb 15.7 oz)  SpO2: 100% 93% 96% 99%    Gen:  NAD Cardiovascular:  RRR, No M/R/G Respiratory: Lungs CTAB Gastrointestinal: Abdomen softly distended, NT/ND with normal active bowel sounds. Extremities: No C/E/C   The results of significant diagnostics from this hospitalization (including imaging, microbiology, ancillary and laboratory) are listed below for reference.  Procedures and Diagnostic Studies:   Ct Head Wo Contrast  05/14/2015   CLINICAL DATA:  [place].  Anticoagulated.  EXAM: CT HEAD WITHOUT CONTRAST  CT CERVICAL SPINE WITHOUT CONTRAST  TECHNIQUE: Multidetector CT imaging of the head and cervical spine was performed following the standard protocol without intravenous contrast. Multiplanar CT image reconstructions of the cervical spine were also generated.  COMPARISON:  None.  FINDINGS: CT HEAD  FINDINGS  There is no intracranial hemorrhage or extra-axial fluid collection. There is moderately severe generalized atrophy. There is mild white matter hypodensity consistent with chronic small vessel disease. Calvarium and skullbase are intact.  CT CERVICAL SPINE FINDINGS  The vertebral column, pedicles and facet articulations are intact. There is no evidence of acute fracture. No acute soft tissue abnormalities are evident.  Mild arthritic changes are evident in the midcervical spine. Severe atlantoaxial degenerative changes are present.  IMPRESSION: 1. Negative for acute intracranial traumatic injury. There is moderately severe generalized atrophy and chronic small vessel disease. 2. Negative for acute cervical spine fracture   Electronically Signed   By: Andreas Newport M.D.   On: 05/14/2015 02:40   Ct Chest W Contrast  05/14/2015   CLINICAL DATA:  Lower back and hip pain after falling.  EXAM: CT CHEST, ABDOMEN, AND PELVIS WITH CONTRAST  TECHNIQUE: Multidetector CT imaging of the chest, abdomen and pelvis was performed following the standard protocol during bolus administration of intravenous contrast.  CONTRAST:  11m OMNIPAQUE IOHEXOL 300 MG/ML  SOLN  COMPARISON:  01/03/2015, 11/30/2014, 04/24/2014.  FINDINGS: CT CHEST FINDINGS  Mediastinum/Nodes: The mediastinum and intrathoracic vascular structures are intact. There is no mediastinal hemorrhage.  Lungs/Pleura: There is no pneumothorax. There is no effusion. There is unchanged right lower lobe collapse and consolidation.  Musculoskeletal: No acute fracture is evident. There is chronic moderately displaced fracture of the sternum. There is severe thoracic kyphosis with chronic severe compressions of T6-T8, compression of T12, and moderate superior endplate impaction at L3. These are unchanged. Prior kyphoplasties again noted in the thoracic spine. Multiple chronic rib fracture deformities are present.  CT ABDOMEN PELVIS FINDINGS  Hepatobiliary: A few  unchanged sub cm hepatic hypodensities are present. There is no hepatic laceration.  Pancreas: Unremarkable  Spleen: Normal except for a small unchanged hypodense focus anteriorly.  Adrenals/Urinary Tract: Adrenals and kidneys are unremarkable. Ureters are unremarkable. Urinary bladder is moderately distended but appears intact.  Stomach/Bowel: Bowel appears intact. No peritoneal free air or blood.  Vascular/Lymphatic: The abdominal aorta is normal in caliber with extensive atherosclerotic calcification.  Reproductive: Unremarkable  Musculoskeletal: There is diastases of the left sacroiliac joint. There are fractures of the left iliac bone adjacent to the sacroiliac joint, minimally displaced. There are moderately displaced left superior and inferior pubic rami fractures. The hips and acetabuli are intact. Pubic symphysis is intact.  IMPRESSION: 1. Negative for acute intrathoracic traumatic injury. 2. There are acute pelvic fractures including the left pubic rami, and left iliac bone adjacent to the SI joint. There is diastases of the left SI joint. 3. No significant hematoma associated with these pelvic fractures. 4. No evidence of bowel or intra-abdominal parenchymal organ injury.   Electronically Signed   By: DAndreas NewportM.D.   On: 05/14/2015 03:00   Ct Cervical Spine Wo Contrast  05/14/2015   CLINICAL DATA:  [place]  Anticoagulated.  EXAM: CT HEAD WITHOUT CONTRAST  CT CERVICAL SPINE WITHOUT CONTRAST  TECHNIQUE: Multidetector CT imaging of the head and cervical spine was performed following the  standard protocol without intravenous contrast. Multiplanar CT image reconstructions of the cervical spine were also generated.  COMPARISON:  None.  FINDINGS: CT HEAD FINDINGS  There is no intracranial hemorrhage or extra-axial fluid collection. There is moderately severe generalized atrophy. There is mild white matter hypodensity consistent with chronic small vessel disease. Calvarium and skullbase are intact.  CT  CERVICAL SPINE FINDINGS  The vertebral column, pedicles and facet articulations are intact. There is no evidence of acute fracture. No acute soft tissue abnormalities are evident.  Mild arthritic changes are evident in the midcervical spine. Severe atlantoaxial degenerative changes are present.  IMPRESSION: 1. Negative for acute intracranial traumatic injury. There is moderately severe generalized atrophy and chronic small vessel disease. 2. Negative for acute cervical spine fracture   Electronically Signed   By: Andreas Newport M.D.   On: 05/14/2015 02:40   Ct Abdomen Pelvis W Contrast  05/14/2015   CLINICAL DATA:  Lower back and hip pain after falling.  EXAM: CT CHEST, ABDOMEN, AND PELVIS WITH CONTRAST  TECHNIQUE: Multidetector CT imaging of the chest, abdomen and pelvis was performed following the standard protocol during bolus administration of intravenous contrast.  CONTRAST:  45m OMNIPAQUE IOHEXOL 300 MG/ML  SOLN  COMPARISON:  01/03/2015, 11/30/2014, 04/24/2014.  FINDINGS: CT CHEST FINDINGS  Mediastinum/Nodes: The mediastinum and intrathoracic vascular structures are intact. There is no mediastinal hemorrhage.  Lungs/Pleura: There is no pneumothorax. There is no effusion. There is unchanged right lower lobe collapse and consolidation.  Musculoskeletal: No acute fracture is evident. There is chronic moderately displaced fracture of the sternum. There is severe thoracic kyphosis with chronic severe compressions of T6-T8, compression of T12, and moderate superior endplate impaction at L3. These are unchanged. Prior kyphoplasties again noted in the thoracic spine. Multiple chronic rib fracture deformities are present.  CT ABDOMEN PELVIS FINDINGS  Hepatobiliary: A few unchanged sub cm hepatic hypodensities are present. There is no hepatic laceration.  Pancreas: Unremarkable  Spleen: Normal except for a small unchanged hypodense focus anteriorly.  Adrenals/Urinary Tract: Adrenals and kidneys are unremarkable.  Ureters are unremarkable. Urinary bladder is moderately distended but appears intact.  Stomach/Bowel: Bowel appears intact. No peritoneal free air or blood.  Vascular/Lymphatic: The abdominal aorta is normal in caliber with extensive atherosclerotic calcification.  Reproductive: Unremarkable  Musculoskeletal: There is diastases of the left sacroiliac joint. There are fractures of the left iliac bone adjacent to the sacroiliac joint, minimally displaced. There are moderately displaced left superior and inferior pubic rami fractures. The hips and acetabuli are intact. Pubic symphysis is intact.  IMPRESSION: 1. Negative for acute intrathoracic traumatic injury. 2. There are acute pelvic fractures including the left pubic rami, and left iliac bone adjacent to the SI joint. There is diastases of the left SI joint. 3. No significant hematoma associated with these pelvic fractures. 4. No evidence of bowel or intra-abdominal parenchymal organ injury.   Electronically Signed   By: DAndreas NewportM.D.   On: 05/14/2015 03:00   Dg Hip Unilat With Pelvis 2-3 Views Left  05/13/2015   CLINICAL DATA:  Fall  EXAM: DG HIP (WITH OR WITHOUT PELVIS) 2-3V LEFT  COMPARISON:  None.  FINDINGS: There are fractures of the left superior and inferior pubic rami with displacement. Bones are osteopenic. The left femoral neck is grossly intact.  IMPRESSION: Osteopenia  Displaced fractures of the left superior and inferior pubic rami.   Electronically Signed   By: AMarybelle KillingsM.D.   On: 05/13/2015 23:38   2  D Echocardiogram 05/15/15  Study Conclusions  - Left ventricle: The cavity size was normal. There was mild focal basal hypertrophy of the septum. Systolic function was normal. The estimated ejection fraction was in the range of 60% to 65%. The study is not technically sufficient to allow evaluation of LV diastolic function. - Aortic valve: Trileaflet. Sclerosis without stenosis. There was no regurgitation. - Mitral  valve: Calcified annulus. There was trivial regurgitation. - Left atrium: The atrium was mildly dilated at 37 ml/m2. - Right atrium: The atrium was mildly dilated. - Tricuspid valve: There was severe regurgitation. - Pulmonary arteries: PA peak pressure: 72 mm Hg + RAP - Systemic veins: The IVC was not visualized.  Impressions:  - Compared to the prior echo in 2014, the EF is higher. There is now severe MR with an increased RVSP from 47 mmHg to 72 mmHg + RAP - the IVC was not visualized. Consistent with severe pulmonary hypertension.  Labs:   Basic Metabolic Panel:  Recent Labs Lab 05/13/15 2349 05/14/15 0112 05/14/15 0850 05/15/15 0515 05/15/15 0847  NA 140 140 134* 127*  --   K 3.2* 3.1* 4.8 6.2* 5.5*  CL 99* 99* 96* 92*  --   CO2  --   --  27 26  --   GLUCOSE 91 85 363* 404*  --   BUN 21* 21* 24* 37*  --   CREATININE 1.20* 1.20* 1.51* 2.79*  --   CALCIUM  --   --  8.5* 8.2*  --    GFR Estimated Creatinine Clearance: 9.3 mL/min (by C-G formula based on Cr of 2.79). Liver Function Tests:  Recent Labs Lab 05/14/15 0850  AST 25  ALT 20  ALKPHOS 50  BILITOT 0.7  PROT 6.5  ALBUMIN 3.5   Coagulation profile  Recent Labs Lab 05/13/15 2327 05/14/15 0909 05/15/15 0515  INR 2.67* 2.92* 5.48*    CBC:  Recent Labs Lab 05/13/15 2327 05/13/15 2349 05/14/15 0112 05/14/15 0850 05/14/15 1830 05/15/15 0515  WBC 13.6*  --   --  9.9 8.6 8.2  NEUTROABS 12.3*  --   --  9.0*  --   --   HGB 11.7* 13.3 13.3 11.3* 10.3* 10.3*  HCT 35.8* 39.0 39.0 34.8* 31.3* 31.3*  MCV 90.4  --   --  91.1 91.8 92.6  PLT 148*  --   --  127* 132* 112*   CBG:  Recent Labs Lab 05/14/15 0834 05/14/15 1208 05/14/15 1630 05/14/15 2206 05/15/15 0746  GLUCAP 302* 267* 159* 172* 390*    Discharge Instructions:   Discharge Instructions    Call MD for:  extreme fatigue    Complete by:  As directed      Call MD for:  persistant dizziness or light-headedness    Complete by:   As directed      Call MD for:  persistant nausea and vomiting    Complete by:  As directed      Call MD for:  severe uncontrolled pain    Complete by:  As directed      Diet - low sodium heart healthy    Complete by:  As directed      Increase activity slowly    Complete by:  As directed   Toe touch with gradual increase of weight bearing as tolerated.     Walk with assistance    Complete by:  As directed             Medication List  STOP taking these medications        spironolactone 25 MG tablet  Commonly known as:  ALDACTONE      TAKE these medications        ACCU-CHEK SMARTVIEW test strip  Generic drug:  glucose blood  Use test strips to check blood sugar at least 3 times a day.     acetaminophen 500 MG tablet  Commonly known as:  TYLENOL  Take 500 mg by mouth every 6 (six) hours as needed.     atorvastatin 10 MG tablet  Commonly known as:  LIPITOR  Take 10 mg by mouth daily.     bisacodyl 5 MG EC tablet  Commonly known as:  bisacodyl  Take 1 tablet (5 mg total) by mouth daily as needed for moderate constipation.     cholecalciferol 1000 UNITS tablet  Commonly known as:  VITAMIN D  Take 1,000 Units by mouth daily.     furosemide 40 MG tablet  Commonly known as:  LASIX  TAKE 2 TABLETS BY MOUTH EVERY DAY     insulin lispro 100 UNIT/ML injection  Commonly known as:  HUMALOG  Inject 3-4 Units into the skin 3 (three) times daily with meals. Depending on sugar level.     Insulin Syringe-Needle U-100 30G X 5/16" 1 ML Misc  by Does not apply route.     ondansetron 4 MG tablet  Commonly known as:  ZOFRAN  Take 1 tablet (4 mg total) by mouth every 6 (six) hours as needed for nausea.     polyethylene glycol packet  Commonly known as:  MIRALAX / GLYCOLAX  Take 17 g by mouth daily.     TOUJEO SOLOSTAR   Inject 5 Units into the skin daily.     traMADol 50 MG tablet  Commonly known as:  ULTRAM  Take 1 tablet (50 mg total) by mouth every 6 (six) hours  as needed for moderate pain.     warfarin 5 MG tablet  Commonly known as:  COUMADIN  Take as instructed by the anticoagulation clinic           Follow-up Information    Follow up with MURPHY, TIMOTHY D, MD In 2 weeks.   Specialty:  Orthopedic Surgery   Contact information:   [address] [place] [id number] [phone]       Schedule an appointment as soon as possible for a visit with [name], MD.   Specialty:  Internal Medicine   Why:  1 week after discharge from rehab for hospital follow up.   Contact information:   [address] [place] [address] [phone]        Time coordinating discharge: 35 minutes.  Signed:  RAMA,CHRISTINA  Pager [phone] [facility] 05/15/2015, 10:53 AM

## 2015-05-15 NOTE — Discharge Instructions (Signed)
Touch down weight bearing in the LLE for 4-6 weeks.   Simple Pelvic Fracture, Adult A pelvic fracture is a break in one of the pelvic bones. The pelvic bones include the bones that you sit on and the bones that make up the lower part of your spine. A pelvic fracture is called simple if the broken bones are stable and are not moving out of place. CAUSES  Common causes of this type of fracture include:  A fall.  A car accident.  Force or pressure applied to the pelvis. RISK FACTORS You may be at higher risk for this type of fracture if:  You play high-impact sports.  You are an older person with a condition that causes weak bones (osteoporosis).  You have a bone-weakening disease. SIGNS AND SYMPTOMS Signs and symptoms may include:  Tenderness, swelling, or bruising in the affected area.  Pain when moving the hip.  Pain when walking or standing. DIAGNOSIS A diagnosis is made with a physical exam and X-rays. Sometimes, a CT scan is also done. TREATMENT The goal of treatment is to get the bones to heal in a good position. Treatment of a simple pelvic fracture usually involves staying in bed (bed rest) and using crutches or a walker until the bones heal. Medicines may be prescribed for pain. Medicines may also be prescribed that help to prevent blood clots from forming in the legs. HOME CARE INSTRUCTIONS Managing Pain, Stiffness, and Swelling  If directed, apply ice to the injured area:  Put ice in a plastic bag.  Place a towel between your skin and the bag.  Leave the ice on for 20 minutes, 2-3 times a day.  Raise the injured area above the level of your heart while you are sitting or lying down. Driving  Do not  drive or operate heavy machinery until your health care provider tells you it is safe to do. Activity  Stay on bed rest for as long as directed by your health care provider.  While on bed rest:  Change the position of your legs every 1-2 hours. This keeps  blood moving well through both of your legs.  You may sit for as long as you feel comfortable.  After bed rest:  Avoid strenuous activities for as long as directed by your health care provider.  Return to your normal activities as directed by your health care provider. Ask your health care provider what activities are safe for you. Safety  Do not use the injured limb to support your body weight until your health care provider says that you can. Use crutches or a walker as directed by your health care provider. General Instructions  Do not use any tobacco products, including cigarettes, chewing tobacco, or electronic cigarettes. Tobacco can delay bone healing. If you need help quitting, ask your health care provider.  Take medicines only as directed by your health care provider.  Keep all follow-up visits as directed by your health care provider. This is important. SEEK MEDICAL CARE IF:  Your pain gets worse.  Your pain is not relieved with medicines. SEEK IMMEDIATE MEDICAL CARE IF:  You feel light-headed or faint.  You develop chest pain.  You develop shortness of breath.  You have a fever.  You have blood in your urine or your stools.  You have vaginal bleeding.  You have difficulty or pain with urination or with a bowel movement.  You have difficulty or increased pain with walking.  You have new or increased  swelling in one of your legs.  You have numbness in your legs or groin area.   This information is not intended to replace advice given to you by your health care provider. Make sure you discuss any questions you have with your health care provider.   Document Released: 10/05/2001 Document Revised: 08/17/2014 Document Reviewed: 03/20/2014 Elsevier Interactive Patient Education [employer].

## 2015-05-15 NOTE — Progress Notes (Signed)
[place] for warfarin Indication: atrial fibrillation  Allergies  Allergen Reactions  . Codeine Nausea And Vomiting         Patient Measurements: Height: '4\' 10"'$  (147.3 cm) Weight: 98 lb 15.7 oz (44.897 kg) IBW/kg (Calculated) : 40.9  Vital Signs: Temp: 97.7 F (36.5 C) (10/05 0524) Temp Source: Oral (10/05 0524) BP: 124/57 mmHg (10/05 0524) Pulse Rate: 97 (10/05 0524)  Labs:  Recent Labs  05/13/15 2327 05/13/15 2349 05/14/15 0112 05/14/15 0850 05/14/15 0909 05/14/15 1830 05/15/15 0515  HGB 11.7* 13.3 13.3 11.3*  --  10.3* 10.3*  HCT 35.8* 39.0 39.0 34.8*  --  31.3* 31.3*  PLT 148*  --   --  127*  --  132* 112*  LABPROT 28.0*  --   --   --  30.0*  --  48.2*  INR 2.67*  --   --   --  2.92*  --  5.48*  CREATININE  --  1.20* 1.20* 1.51*  --   --   --     Estimated Creatinine Clearance: 17.3 mL/min (by C-G formula based on Cr of 1.51).   Medical History: Past Medical History  Diagnosis Date  . Arthritis   . Hyperlipidemia   . 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 Closed fracture of unspecified part of femur 2008  . TIA (transient ischemic attack)   . Edema   . Chronic diastolic heart failure ([place])   . Osteoporosis with fracture     compression fx lower T spine and t4, L rib  . Thyroid nodule     dominate R nodule, s/p aspiration>abn path>6 mo obs planned ([place] 06/01/11)  . GERD (gastroesophageal reflux disease)   . Hypertension 2006  . Atrial fibrillation (HCC)     chronic anticoag; and AFlutter  . History of radiation therapy 11/30/2012-12/09/2012    50 gray to right chest  . Vertebral compression fracture [facility])     Thoracic 12 and Thoracic 7  . Adenocarcinoma, lung ([place]) dx 11/08/12    s/p CT chest bx - XRT thru 12/2012  . Diabetes mellitus, type II, insulin dependent ([place])     over 40 years ago    Medications:  Prescriptions prior to admission  Medication Sig Dispense Refill Last Dose   . atorvastatin (LIPITOR) 10 MG tablet Take 10 mg by mouth daily.   5 05/13/2015 at Unknown time  . cholecalciferol (VITAMIN D) 1000 UNITS tablet Take 1,000 Units by mouth daily.    05/13/2015 at Unknown time  . furosemide (LASIX) 40 MG tablet TAKE 2 TABLETS BY MOUTH EVERY DAY 60 tablet 7 05/13/2015 at Unknown time  . Insulin Glargine (TOUJEO SOLOSTAR [place]) Inject 5 Units into the skin daily.    05/13/2015 at Unknown time  . insulin lispro (HUMALOG) 100 UNIT/ML injection Inject 3-4 Units into the skin 3 (three) times daily with meals. Depending on sugar level.   05/13/2015 at Unknown time  . spironolactone (ALDACTONE) 25 MG tablet Take 25 mg by mouth daily.   2 05/13/2015 at Unknown time  . warfarin (COUMADIN) 5 MG tablet Take as instructed by the anticoagulation clinic (Patient taking differently: Take 2.5-5 mg by mouth daily. Take 1 tablet (5 mg) daily except for Fridays Take 0.5 tablet (2.5 mg)) 30 tablet 3 05/13/2015 at 1830  . ACCU-CHEK SMARTVIEW test strip Use test strips to check blood sugar at least 3 times a day.  4 Taking  . acetaminophen (TYLENOL)  500 MG tablet Take 500 mg by mouth every 6 (six) hours as needed.   unknown at unknown time  . Insulin Syringe-Needle U-100 30G X 5/16" 1 ML MISC by Does not apply route.   Taking  . warfarin (COUMADIN) 5 MG tablet TAKE AS DIRECTED BY ANTICOAGULATION CLINIC (Patient not taking: Reported on 05/13/2015) 35 tablet 0 Not Taking at Unknown time   Scheduled:  . atorvastatin  10 mg Oral q1800  . insulin aspart  0-9 Units Subcutaneous TID WC  . insulin glargine  5 Units Subcutaneous Daily  . sodium chloride  3 mL Intravenous Q12H  . Warfarin - Pharmacist Dosing Inpatient   Does not apply q1800    Assessment: Patient on chronic warfarin for afib admitted with pelvic fracture.  INR was at goal on admission, but has risen to supra-therapeutic level today on home dose. No bleeding noted, however platelets trending down.   Heart healthy diet ordered- charted  that patient is eating 0%.   Patient received x1 dose of Toradol yesterday.  Goal of Therapy:  INR 2-3    Plan:  Hold Coumadin Daily INR Consider low dose Vit K if s/sx of bleeding noted  Wendy Hopkins 05/15/2015,7:14 AM

## 2015-05-15 NOTE — Progress Notes (Signed)
Pt has a SNF bed at [facility] ( [facility] ) when stable for d/c.  Werner Lean LCSW [phone]

## 2015-05-15 NOTE — Care Management Note (Signed)
Case Management Note  Patient Details  Name: Wendy Hopkins MRN: [id number] Date of Birth: [birth year]  Subjective/Objective:                   pelvic pain Action/Plan:  Discharge planning Expected Discharge Date:   (UNKNOWN)               Expected Discharge Plan:  [name]  In-House Referral:     Discharge planning Services  CM Consult  Post Acute Care Choice:    Choice offered to:     DME Arranged:    DME Agency:     HH Arranged:    [place] Agency:     Status of Service:     Medicare Important Message Given:    Date Medicare IM Given:    Medicare IM give by:    Date Additional Medicare IM Given:    Additional Medicare Important Message give by:     If discussed at [place] of Stay Meetings, dates discussed:    Additional Comments: LATE ENTRY: 05/14/15 17:15 Utilization Review completed; pt status changed to observation; POA signed Medicare Observation status Notification; copy in shadow chart.  CM confirmed plan of SNF for rehab with pt and daughter (POA).  No other CM needs were communicated. Dellie Catholic, RN 05/15/2015, 8:50 AM

## 2015-05-15 NOTE — Progress Notes (Signed)
Physical Therapy Treatment Patient Details Name: Wendy Hopkins MRN: [id number] DOB: 06-02-1926 Today's Date: 05/15/2015    History of Present Illness Wendy Hopkins is a 81 y.o. female with history of chronic atrial fibrillation, chronic diastolic CHF, diabetes mellitus type 2 was brought to the ER after patient had a fall at her home. There are acute pelvic fractures including the left pubic rami, L and left iliac bone adjacent to the SI joint. There is diastases of    PT Comments    Assisted pt OOB to amb a limited distance to bathroom. Instructed on proper tech to achieve TTWB during gait.  Amb a totakl distance of 9 feet.    Follow Up Recommendations  SNF ([facility])     Equipment Recommendations  None recommended by PT    Recommendations for Other Services       Precautions / Restrictions Precautions Precautions: Fall Restrictions Weight Bearing Restrictions: Yes LLE Weight Bearing: Touchdown weight bearing Other Position/Activity Restrictions: per note from Dr. [name]    Mobility  Bed Mobility Overal bed mobility: Needs Assistance Bed Mobility: Supine to Sit     Supine to sit: Mod assist Sit to supine: Mod assist   General bed mobility comments: Mod assist for management of BLEs. Cues for safety, pain management control, technique.   Transfers Overall transfer level: Needs assistance Equipment used: Rolling walker (2 wheeled) (youth) Transfers: Sit to/from Stand Sit to Stand: Min assist;+2 safety/equipment Stand pivot transfers: Mod assist       General transfer comment: 75% VC's on proper tech and TTWB.  also required increased time and max encouragement to increase self assist.   Ambulation/Gait Ambulation/Gait assistance: Min assist;Mod assist;+2 safety/equipment Ambulation Distance (Feet): 9 Feet (6 feet then another 3 feet) Assistive device: Rolling walker (2 wheeled) Gait Pattern/deviations: Step-to pattern;Decreased stance time - left;Trunk  flexed Gait velocity: decreased   General Gait Details: 75% VC's on proper sequencing, proper walker advancement and proper tech to achieve TTWB.  Pt was able to amb "hop" from bed to bathroom then from bathroom to recliner.    Stairs            Wheelchair Mobility    Modified Rankin (Stroke Patients Only)       Balance Overall balance assessment: Needs assistance Sitting-balance support: No upper extremity supported;Feet supported Sitting balance-Leahy Scale: Fair     Standing balance support: Bilateral upper extremity supported;During functional activity Standing balance-Leahy Scale: Fair                      Cognition Arousal/Alertness: Awake/alert Behavior During Therapy: WFL for tasks assessed/performed Overall Cognitive Status: Within Functional Limits for tasks assessed                      Exercises      General Comments        Pertinent Vitals/Pain Pain Assessment: 0-10 Pain Score: 8  Faces Pain Scale: Hurts whole lot Pain Location: L anterior hip Pain Descriptors / Indicators: Aching;Discomfort;Grimacing Pain Intervention(s): Monitored during session;Repositioned    Home Living Family/patient expects to be discharged to:: Skilled nursing facility Living Arrangements: Spouse/significant other             Additional Comments: Used walk-in shower PTA, pt states she was independent with all  ADLs    Prior Function Level of Independence: Independent      Comments: balance issues, did not use RW   PT Goals (current goals  can now be found in the care plan section) Acute Rehab PT Goals Patient Stated Goal: decrease pain Progress towards PT goals: Progressing toward goals    Frequency  Min 3X/week    PT Plan      Co-evaluation             End of Session Equipment Utilized During Treatment: Gait belt Activity Tolerance: Patient limited by fatigue Patient left: in chair;with call bell/phone within reach;with  family/visitor present     Time: 1220-1250 PT Time Calculation (min) (ACUTE ONLY): 30 min  Charges:  $Gait Training: 8-22 mins $Therapeutic Activity: 8-22 mins                    G Codes:      Rica Koyanagi  PTA [facility]  Acute  Rehab Pager      [phone]

## 2015-05-16 DIAGNOSIS — I5030 Unspecified diastolic (congestive) heart failure: Secondary | ICD-10-CM | POA: Diagnosis not present

## 2015-05-16 DIAGNOSIS — E119 Type 2 diabetes mellitus without complications: Secondary | ICD-10-CM | POA: Diagnosis not present

## 2015-05-16 DIAGNOSIS — I4891 Unspecified atrial fibrillation: Secondary | ICD-10-CM | POA: Diagnosis not present

## 2015-05-16 DIAGNOSIS — S329XXA Fracture of unspecified parts of lumbosacral spine and pelvis, initial encounter for closed fracture: Secondary | ICD-10-CM | POA: Diagnosis not present

## 2015-05-17 NOTE — Progress Notes (Signed)
   05/14/15 1517  PT Time Calculation  PT Start Time (ACUTE ONLY) 1422  PT Stop Time (ACUTE ONLY) 1507  PT Time Calculation (min) (ACUTE ONLY) 45 min  PT G-Codes **NOT FOR INPATIENT CLASS**  Functional Assessment Tool Used clinical judgement  Functional Limitation Mobility: Walking and moving around  Mobility: Walking and Moving Around Current Status (D4081) CM  Mobility: Walking and Moving Around Goal Status (K4818) CI  PT General Charges  $$ ACUTE PT VISIT 1 Procedure  PT Evaluation  $Initial PT Evaluation Tier I 1 Procedure  PT Treatments  $Therapeutic Activity 23-37 mins

## 2015-05-21 ENCOUNTER — Telehealth: Payer: Self-pay | Admitting: *Deleted

## 2015-05-21 NOTE — Telephone Encounter (Signed)
Pt was on TCM list D/C 05/15/15, but sent to SNF...Wendy Hopkins

## 2015-05-22 DIAGNOSIS — S329XXA Fracture of unspecified parts of lumbosacral spine and pelvis, initial encounter for closed fracture: Secondary | ICD-10-CM | POA: Diagnosis not present

## 2015-05-22 DIAGNOSIS — I509 Heart failure, unspecified: Secondary | ICD-10-CM | POA: Diagnosis not present

## 2015-05-22 DIAGNOSIS — N19 Unspecified kidney failure: Secondary | ICD-10-CM | POA: Diagnosis not present

## 2015-05-22 DIAGNOSIS — R609 Edema, unspecified: Secondary | ICD-10-CM | POA: Diagnosis not present

## 2015-05-25 ENCOUNTER — Inpatient Hospital Stay (HOSPITAL_COMMUNITY)
Admission: EM | Admit: 2015-05-25 | Discharge: 2015-05-31 | DRG: 291 | Disposition: A | Discharge status: Skilled Nursing Facility | Payer: Medicare Other | Attending: Internal Medicine | Admitting: Internal Medicine

## 2015-05-25 ENCOUNTER — Encounter (HOSPITAL_COMMUNITY): Payer: Self-pay | Admitting: Emergency Medicine

## 2015-05-25 ENCOUNTER — Emergency Department (HOSPITAL_COMMUNITY): Payer: Medicare Other

## 2015-05-25 DIAGNOSIS — I5033 Acute on chronic diastolic (congestive) heart failure: Secondary | ICD-10-CM | POA: Diagnosis not present

## 2015-05-25 DIAGNOSIS — R531 Weakness: Secondary | ICD-10-CM | POA: Diagnosis not present

## 2015-05-25 DIAGNOSIS — E8770 Fluid overload, unspecified: Secondary | ICD-10-CM | POA: Diagnosis not present

## 2015-05-25 DIAGNOSIS — E873 Alkalosis: Secondary | ICD-10-CM | POA: Diagnosis not present

## 2015-05-25 DIAGNOSIS — G319 Degenerative disease of nervous system, unspecified: Secondary | ICD-10-CM | POA: Diagnosis not present

## 2015-05-25 DIAGNOSIS — I4891 Unspecified atrial fibrillation: Secondary | ICD-10-CM | POA: Diagnosis not present

## 2015-05-25 DIAGNOSIS — D62 Acute posthemorrhagic anemia: Secondary | ICD-10-CM | POA: Diagnosis not present

## 2015-05-25 DIAGNOSIS — I110 Hypertensive heart disease with heart failure: Secondary | ICD-10-CM | POA: Diagnosis not present

## 2015-05-25 DIAGNOSIS — Z79899 Other long term (current) drug therapy: Secondary | ICD-10-CM | POA: Diagnosis not present

## 2015-05-25 DIAGNOSIS — M1990 Unspecified osteoarthritis, unspecified site: Secondary | ICD-10-CM | POA: Diagnosis present

## 2015-05-25 DIAGNOSIS — R0602 Shortness of breath: Secondary | ICD-10-CM | POA: Diagnosis not present

## 2015-05-25 DIAGNOSIS — E041 Nontoxic single thyroid nodule: Secondary | ICD-10-CM | POA: Diagnosis present

## 2015-05-25 DIAGNOSIS — I272 Other secondary pulmonary hypertension: Secondary | ICD-10-CM | POA: Diagnosis present

## 2015-05-25 DIAGNOSIS — E11649 Type 2 diabetes mellitus with hypoglycemia without coma: Secondary | ICD-10-CM | POA: Diagnosis present

## 2015-05-25 DIAGNOSIS — Z7901 Long term (current) use of anticoagulants: Secondary | ICD-10-CM

## 2015-05-25 DIAGNOSIS — I482 Chronic atrial fibrillation: Secondary | ICD-10-CM | POA: Diagnosis present

## 2015-05-25 DIAGNOSIS — F4489 Other dissociative and conversion disorders: Secondary | ICD-10-CM | POA: Diagnosis not present

## 2015-05-25 DIAGNOSIS — N179 Acute kidney failure, unspecified: Secondary | ICD-10-CM | POA: Diagnosis not present

## 2015-05-25 DIAGNOSIS — E871 Hypo-osmolality and hyponatremia: Secondary | ICD-10-CM | POA: Diagnosis present

## 2015-05-25 DIAGNOSIS — W19XXXD Unspecified fall, subsequent encounter: Secondary | ICD-10-CM | POA: Diagnosis present

## 2015-05-25 DIAGNOSIS — Z794 Long term (current) use of insulin: Secondary | ICD-10-CM

## 2015-05-25 DIAGNOSIS — K219 Gastro-esophageal reflux disease without esophagitis: Secondary | ICD-10-CM | POA: Diagnosis not present

## 2015-05-25 DIAGNOSIS — I071 Rheumatic tricuspid insufficiency: Secondary | ICD-10-CM | POA: Diagnosis present

## 2015-05-25 DIAGNOSIS — Z923 Personal history of irradiation: Secondary | ICD-10-CM | POA: Diagnosis not present

## 2015-05-25 DIAGNOSIS — D500 Iron deficiency anemia secondary to blood loss (chronic): Secondary | ICD-10-CM | POA: Diagnosis present

## 2015-05-25 DIAGNOSIS — S32592D Other specified fracture of left pubis, subsequent encounter for fracture with routine healing: Secondary | ICD-10-CM

## 2015-05-25 DIAGNOSIS — I739 Peripheral vascular disease, unspecified: Secondary | ICD-10-CM | POA: Diagnosis present

## 2015-05-25 DIAGNOSIS — N170 Acute kidney failure with tubular necrosis: Secondary | ICD-10-CM | POA: Diagnosis not present

## 2015-05-25 DIAGNOSIS — E785 Hyperlipidemia, unspecified: Secondary | ICD-10-CM | POA: Diagnosis not present

## 2015-05-25 DIAGNOSIS — Z8673 Personal history of transient ischemic attack (TIA), and cerebral infarction without residual deficits: Secondary | ICD-10-CM

## 2015-05-25 DIAGNOSIS — E119 Type 2 diabetes mellitus without complications: Secondary | ICD-10-CM

## 2015-05-25 DIAGNOSIS — D638 Anemia in other chronic diseases classified elsewhere: Secondary | ICD-10-CM | POA: Diagnosis not present

## 2015-05-25 DIAGNOSIS — Z85118 Personal history of other malignant neoplasm of bronchus and lung: Secondary | ICD-10-CM

## 2015-05-25 DIAGNOSIS — Y92009 Unspecified place in unspecified non-institutional (private) residence as the place of occurrence of the external cause: Secondary | ICD-10-CM

## 2015-05-25 DIAGNOSIS — E875 Hyperkalemia: Secondary | ICD-10-CM | POA: Diagnosis present

## 2015-05-25 DIAGNOSIS — L8990 Pressure ulcer of unspecified site, unspecified stage: Secondary | ICD-10-CM | POA: Diagnosis present

## 2015-05-25 DIAGNOSIS — Z66 Do not resuscitate: Secondary | ICD-10-CM | POA: Diagnosis present

## 2015-05-25 DIAGNOSIS — I6789 Other cerebrovascular disease: Secondary | ICD-10-CM | POA: Diagnosis not present

## 2015-05-25 DIAGNOSIS — I509 Heart failure, unspecified: Secondary | ICD-10-CM | POA: Diagnosis not present

## 2015-05-25 DIAGNOSIS — M810 Age-related osteoporosis without current pathological fracture: Secondary | ICD-10-CM | POA: Diagnosis present

## 2015-05-25 DIAGNOSIS — I481 Persistent atrial fibrillation: Secondary | ICD-10-CM | POA: Diagnosis not present

## 2015-05-25 DIAGNOSIS — I10 Essential (primary) hypertension: Secondary | ICD-10-CM | POA: Diagnosis not present

## 2015-05-25 DIAGNOSIS — J90 Pleural effusion, not elsewhere classified: Secondary | ICD-10-CM | POA: Diagnosis not present

## 2015-05-25 LAB — CBC WITH DIFFERENTIAL/PLATELET
BASOS ABS: 0 10*3/uL (ref 0.0–0.1)
Basophils Relative: 0 %
Eosinophils Absolute: 0.1 10*3/uL (ref 0.0–0.7)
Eosinophils Relative: 1 %
HEMATOCRIT: 28.2 % — AB (ref 36.0–46.0)
Hemoglobin: 9.5 g/dL — ABNORMAL LOW (ref 12.0–15.0)
Lymphocytes Relative: 7 %
Lymphs Abs: 0.7 10*3/uL (ref 0.7–4.0)
MCH: 30 pg (ref 26.0–34.0)
MCHC: 33.7 g/dL (ref 30.0–36.0)
MCV: 89 fL (ref 78.0–100.0)
MONOS PCT: 8 %
Monocytes Absolute: 0.8 10*3/uL (ref 0.1–1.0)
NEUTROS ABS: 8.4 10*3/uL — AB (ref 1.7–7.7)
Neutrophils Relative %: 84 %
Platelets: 290 10*3/uL (ref 150–400)
RBC: 3.17 MIL/uL — AB (ref 3.87–5.11)
RDW: 14.8 % (ref 11.5–15.5)
WBC: 10.1 10*3/uL (ref 4.0–10.5)

## 2015-05-25 LAB — OSMOLALITY: OSMOLALITY: 268 mosm/kg — AB (ref 275–300)

## 2015-05-25 LAB — URINALYSIS, ROUTINE W REFLEX MICROSCOPIC
BILIRUBIN URINE: NEGATIVE
GLUCOSE, UA: 100 mg/dL — AB
KETONES UR: NEGATIVE mg/dL
Nitrite: NEGATIVE
PH: 5 (ref 5.0–8.0)
Protein, ur: NEGATIVE mg/dL
SPECIFIC GRAVITY, URINE: 1.008 (ref 1.005–1.030)
Urobilinogen, UA: 0.2 mg/dL (ref 0.0–1.0)

## 2015-05-25 LAB — BASIC METABOLIC PANEL
ANION GAP: 7 (ref 5–15)
Anion gap: 7 (ref 5–15)
BUN: 38 mg/dL — ABNORMAL HIGH (ref 6–20)
BUN: 35 mg/dL — ABNORMAL HIGH (ref 6–20)
CALCIUM: 8 mg/dL — AB (ref 8.9–10.3)
CHLORIDE: 88 mmol/L — AB (ref 101–111)
CHLORIDE: 89 mmol/L — AB (ref 101–111)
CO2: 29 mmol/L (ref 22–32)
CO2: 29 mmol/L (ref 22–32)
CREATININE: 2.24 mg/dL — AB (ref 0.44–1.00)
Calcium: 8.4 mg/dL — ABNORMAL LOW (ref 8.9–10.3)
Creatinine, Ser: 2.1 mg/dL — ABNORMAL HIGH (ref 0.44–1.00)
GFR calc Af Amer: 23 mL/min — ABNORMAL LOW (ref >60)
GFR calc non Af Amer: 19 mL/min — ABNORMAL LOW (ref >60)
GFR calc non Af Amer: 20 mL/min — ABNORMAL LOW (ref >60)
GFR, EST AFRICAN AMERICAN: 22 mL/min — AB (ref >60)
GLUCOSE: 119 mg/dL — AB (ref 65–99)
Glucose, Bld: 61 mg/dL — ABNORMAL LOW (ref 65–99)
POTASSIUM: 5 mmol/L (ref 3.5–5.1)
Potassium: 4.4 mmol/L (ref 3.5–5.1)
Sodium: 124 mmol/L — ABNORMAL LOW (ref 135–145)
Sodium: 125 mmol/L — ABNORMAL LOW (ref 135–145)

## 2015-05-25 LAB — PROTIME-INR
INR: 1.47 (ref 0.00–1.49)
Prothrombin Time: 17.9 seconds — ABNORMAL HIGH (ref 11.6–15.2)

## 2015-05-25 LAB — GLUCOSE, CAPILLARY
GLUCOSE-CAPILLARY: 77 mg/dL (ref 65–99)
Glucose-Capillary: 89 mg/dL (ref 65–99)
Glucose-Capillary: 249 mg/dL — ABNORMAL HIGH (ref 65–99)
Glucose-Capillary: 263 mg/dL — ABNORMAL HIGH (ref 65–99)

## 2015-05-25 LAB — TSH: TSH: 3.939 u[IU]/mL (ref 0.350–4.500)

## 2015-05-25 LAB — URINE MICROSCOPIC-ADD ON

## 2015-05-25 LAB — CREATININE, URINE, RANDOM: Creatinine, Urine: 39.85 mg/dL

## 2015-05-25 LAB — OSMOLALITY, URINE: OSMOLALITY UR: 277 mosm/kg — AB (ref 390–1090)

## 2015-05-25 LAB — MRSA PCR SCREENING: MRSA BY PCR: NEGATIVE

## 2015-05-25 LAB — SODIUM, URINE, RANDOM: Sodium, Ur: 46 mmol/L

## 2015-05-25 LAB — BRAIN NATRIURETIC PEPTIDE: B NATRIURETIC PEPTIDE 5: 420.5 pg/mL — AB (ref 0.0–100.0)

## 2015-05-25 MED ORDER — SODIUM CHLORIDE 0.9 % IV SOLN
INTRAVENOUS | Status: DC
  Administered 2015-05-25: 11:00:00 via INTRAVENOUS

## 2015-05-25 MED ORDER — ENOXAPARIN SODIUM 30 MG/0.3ML ~~LOC~~ SOLN
30.0000 mg | SUBCUTANEOUS | Status: DC
  Administered 2015-05-26 – 2015-05-30 (×5): 30 mg via SUBCUTANEOUS
  Filled 2015-05-25 (×5): qty 0.3

## 2015-05-25 MED ORDER — ONDANSETRON HCL 4 MG/2ML IJ SOLN
4.0000 mg | Freq: Four times a day (QID) | INTRAMUSCULAR | Status: DC | PRN
  Administered 2015-05-27 – 2015-05-30 (×3): 4 mg via INTRAVENOUS
  Filled 2015-05-25 (×4): qty 2

## 2015-05-25 MED ORDER — PROMETHAZINE HCL 25 MG PO TABS
12.5000 mg | ORAL_TABLET | Freq: Four times a day (QID) | ORAL | Status: DC | PRN
  Administered 2015-05-26 – 2015-05-28 (×2): 12.5 mg via ORAL
  Filled 2015-05-25 (×3): qty 1

## 2015-05-25 MED ORDER — INSULIN ASPART 100 UNIT/ML ~~LOC~~ SOLN
0.0000 [IU] | Freq: Every day | SUBCUTANEOUS | Status: DC
  Administered 2015-05-25: 3 [IU] via SUBCUTANEOUS
  Administered 2015-05-26: 4 [IU] via SUBCUTANEOUS
  Administered 2015-05-28: 5 [IU] via SUBCUTANEOUS
  Administered 2015-05-29: 3 [IU] via SUBCUTANEOUS

## 2015-05-25 MED ORDER — FUROSEMIDE 10 MG/ML IJ SOLN
60.0000 mg | Freq: Once | INTRAMUSCULAR | Status: AC
  Administered 2015-05-25: 60 mg via INTRAVENOUS
  Filled 2015-05-25: qty 6

## 2015-05-25 MED ORDER — ENOXAPARIN SODIUM 60 MG/0.6ML ~~LOC~~ SOLN: 1.0000 mg/kg | SUBCUTANEOUS | Status: DC

## 2015-05-25 MED ORDER — SODIUM CHLORIDE 0.9 % IJ SOLN
3.0000 mL | Freq: Two times a day (BID) | INTRAMUSCULAR | Status: DC
  Administered 2015-05-25 – 2015-05-31 (×12): 3 mL via INTRAVENOUS

## 2015-05-25 MED ORDER — DEXTROSE 50 % IV SOLN
50.0000 mL | Freq: Once | INTRAVENOUS | Status: AC
  Administered 2015-05-25: 50 mL via INTRAVENOUS
  Filled 2015-05-25: qty 50

## 2015-05-25 MED ORDER — ENOXAPARIN SODIUM 100 MG/ML ~~LOC~~ SOLN
1.5000 mg/kg | SUBCUTANEOUS | Status: DC
  Administered 2015-05-25: 90 mg via SUBCUTANEOUS
  Filled 2015-05-25: qty 1

## 2015-05-25 MED ORDER — FUROSEMIDE 10 MG/ML IJ SOLN
40.0000 mg | Freq: Once | INTRAMUSCULAR | Status: AC
  Administered 2015-05-25: 40 mg via INTRAVENOUS
  Filled 2015-05-25: qty 4

## 2015-05-25 MED ORDER — ACETAMINOPHEN 325 MG PO TABS
650.0000 mg | ORAL_TABLET | Freq: Four times a day (QID) | ORAL | Status: DC | PRN
  Administered 2015-05-26 – 2015-05-31 (×10): 650 mg via ORAL
  Filled 2015-05-25 (×11): qty 2

## 2015-05-25 MED ORDER — INSULIN ASPART 100 UNIT/ML ~~LOC~~ SOLN
0.0000 [IU] | Freq: Three times a day (TID) | SUBCUTANEOUS | Status: DC
  Administered 2015-05-25: 3 [IU] via SUBCUTANEOUS
  Administered 2015-05-26: 7 [IU] via SUBCUTANEOUS
  Administered 2015-05-26: 9 [IU] via SUBCUTANEOUS
  Administered 2015-05-28: 2 [IU] via SUBCUTANEOUS
  Administered 2015-05-28 – 2015-05-29 (×2): 5 [IU] via SUBCUTANEOUS
  Administered 2015-05-29: 2 [IU] via SUBCUTANEOUS
  Administered 2015-05-29 – 2015-05-30 (×2): 3 [IU] via SUBCUTANEOUS
  Administered 2015-05-31: 9 [IU] via SUBCUTANEOUS

## 2015-05-25 MED ORDER — WARFARIN SODIUM 2 MG PO TABS
2.0000 mg | ORAL_TABLET | Freq: Once | ORAL | Status: AC
  Administered 2015-05-25: 2 mg via ORAL
  Filled 2015-05-25: qty 1

## 2015-05-25 MED ORDER — WARFARIN - PHARMACIST DOSING INPATIENT
Freq: Every day | Status: DC
  Administered 2015-05-25 – 2015-05-30 (×3)

## 2015-05-25 MED ORDER — ATORVASTATIN CALCIUM 10 MG PO TABS
10.0000 mg | ORAL_TABLET | Freq: Every day | ORAL | Status: DC
  Administered 2015-05-25 – 2015-05-31 (×7): 10 mg via ORAL
  Filled 2015-05-25 (×7): qty 1

## 2015-05-25 NOTE — Progress Notes (Addendum)
Patient seen and examined  81 year old female with multiple medical problems including diabetes mellitus type 2, essential hypertension, atrial fibrillation on anticoagulation, chronic diastolic heart failure, severe pulmonary hypertension, recently discharged on 10/5 after pelvic fracture to SNF, presented with confusion, acute on chronic renal failure, bilateral lower extremity edema Patient on some chronic anticoagulation with Coumadin and is subtherapeutic on her INR,place on lovenox for VTE prophylaxis until INR therapeutic   X-ray shows bilateral pleural effusions,  Sodium has been trending down, patient received Lasix yesterday, BMP pending from this morning  Check TSH, serum osmolality, urine osmolality pending Consult nephrology if patient's renal function and sodium continued to worsen

## 2015-05-25 NOTE — Progress Notes (Signed)
ANTICOAGULATION CONSULT NOTE - Initial Consult  Pharmacy Consult for Warfarin  Indication: atrial fibrillation  Allergies  Allergen Reactions  . Codeine Nausea And Vomiting         Patient Measurements: Height: '4\' 11"'$  (149.9 cm) Weight: 131 lb (59.421 kg) IBW/kg (Calculated) : 43.2  Vital Signs: Temp: 97.4 F (36.3 C) (10/15 0420) Temp Source: Oral (10/15 0420) BP: 128/69 mmHg (10/15 0420) Pulse Rate: 95 (10/15 0420)  Labs:  Recent Labs  05/25/15 0203  HGB 9.5*  HCT 28.2*  PLT 290  CREATININE 2.24*    Estimated Creatinine Clearance: 14.1 mL/min (by C-G formula based on Cr of 2.24).   Medical History: Past Medical History  Diagnosis Date  . Arthritis   . Hyperlipidemia   . 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 Closed fracture of unspecified part of femur 2008  . TIA (transient ischemic attack)   . Edema   . Chronic diastolic heart failure ([place])   . Osteoporosis with fracture     compression fx lower T spine and t4, L rib  . Thyroid nodule     dominate R nodule, s/p aspiration>abn path>6 mo obs planned ([place] 06/01/11)  . GERD (gastroesophageal reflux disease)   . Hypertension 2006  . Atrial fibrillation ([facility])     chronic anticoag; and AFlutter  . History of radiation therapy 11/30/2012-12/09/2012    50 gray to right chest  . Vertebral compression fracture [facility])     Thoracic 12 and Thoracic 7  . Adenocarcinoma, lung ([place]) dx 11/08/12    s/p CT chest bx - XRT thru 12/2012  . Diabetes mellitus, type II, insulin dependent ([place])     over 40 years ago  . Small vessel disease, cerebrovascular 05/15/2015  . Cerebral atrophy 05/15/2015  . Moderate to severe pulmonary hypertension ([place]) 05/15/2015  . Severe tricuspid regurgitation 05/15/2015    Assessment: Consult for continuation of PTA warfarin for afib. No INR drawn yet.   Goal of Therapy:  INR 2-3 Monitor platelets by anticoagulation protocol: Yes   Plan:  -PT/INR with AM labs to  assess dosing needs  Narda Bonds 05/25/2015,4:36 AM

## 2015-05-25 NOTE — Evaluation (Signed)
Physical Therapy Evaluation Patient Details Name: Wendy Hopkins MRN: [id number] DOB: 1926/01/03 Today's Date: 05/25/2015   History of Present Illness  Pt is an 81 y/o female with a PMH significant for HTN, DM, a-fib, HF, pulmonary HTN who presents with fluid overload. Pt with a recent admission October 3-5 with a pubic ramus fracture treated non-operatively.   Clinical Impression  Pt admitted with above diagnosis. Pt currently with functional limitations due to the deficits listed below (see PT Problem List). At the time of PT eval pt was able to perform transfers with +1 max assist, with +2 required for peri-care after using BSC. Anticipate d/c back to SNF where she has been receiving therapy for prior pelvic fracture. Pt will benefit from skilled PT to increase their independence and safety with mobility to allow discharge to the venue listed below.       Follow Up Recommendations SNF;Supervision/Assistance - 24 hour    Equipment Recommendations  None recommended by PT    Recommendations for Other Services       Precautions / Restrictions Precautions Precautions: Fall Restrictions Weight Bearing Restrictions: Yes LLE Weight Bearing: Touchdown weight bearing Other Position/Activity Restrictions: per note from Dr. [name]      Mobility  Bed Mobility Overal bed mobility: Needs Assistance Bed Mobility: Supine to Sit;Sit to Supine;Rolling Rolling: Mod assist   Supine to sit: Mod assist Sit to supine: Mod assist   General bed mobility comments: VC's for sequencing and technique. Pt required assist for all aspects of bed mobility, including positioning and scooting.   Transfers Overall transfer level: Needs assistance Equipment used: 1 person hand held assist Transfers: Sit to/from [facility] Sit to Stand: Max assist Stand pivot transfers: Max assist       General transfer comment: Assist for face-to-face transfer to standing and then to/from [facility]. A  second person was required for peri-care.   Ambulation/Gait             General Gait Details: Unable at this time.   Stairs            Wheelchair Mobility    Modified Rankin (Stroke Patients Only)       Balance Overall balance assessment: Needs assistance Sitting-balance support: Feet supported;No upper extremity supported Sitting balance-Leahy Scale: Fair     Standing balance support: Bilateral upper extremity supported;During functional activity Standing balance-Leahy Scale: Zero                               Pertinent Vitals/Pain Pain Assessment: Faces Faces Pain Scale: Hurts even more Pain Location: L posterior pelvis. Pain Descriptors / Indicators: Aching;Discomfort;Grimacing Pain Intervention(s): Limited activity within patient's tolerance;Monitored during session;Repositioned    Home Living Family/patient expects to be discharged to:: Skilled nursing facility Living Arrangements: Spouse/significant other               Additional Comments: From [facility]    Prior Function Level of Independence: Needs assistance   Gait / Transfers Assistance Needed: Pt reports she was not walking at [facility], but was transferring only with assistance from the staff  ADL's / Homemaking Assistance Needed: Required assist from staff        Hand Dominance   Dominant Hand: Right    Extremity/Trunk Assessment   Upper Extremity Assessment: Defer to OT evaluation           Lower Extremity Assessment: Generalized weakness      Cervical /  Trunk Assessment: Kyphotic  Communication   Communication: Expressive difficulties (Delayed response)  Cognition Arousal/Alertness: Awake/alert Behavior During Therapy: WFL for tasks assessed/performed Overall Cognitive Status: No family/caregiver present to determine baseline cognitive functioning                      General Comments      Exercises        Assessment/Plan    PT Assessment  Patient needs continued PT services  PT Diagnosis Difficulty walking;Generalized weakness   PT Problem List Decreased strength;Decreased activity tolerance;Decreased range of motion;Decreased balance;Decreased mobility;Decreased knowledge of use of DME;Decreased safety awareness;Decreased knowledge of precautions  PT Treatment Interventions DME instruction;Therapeutic activities;Functional mobility training;Therapeutic exercise;Patient/family education   PT Goals (Current goals can be found in the Care Plan section) Acute Rehab PT Goals Patient Stated Goal: decrease pain PT Goal Formulation: With patient Time For Goal Achievement: 06/01/15 Potential to Achieve Goals: Fair    Frequency Min 2X/week   Barriers to discharge        Co-evaluation               End of Session Equipment Utilized During Treatment: Gait belt Activity Tolerance: Patient limited by fatigue;Patient limited by pain Patient left: in bed;with call bell/phone within reach;with nursing/sitter in room Nurse Communication: Mobility status         Time: 3244-0102 PT Time Calculation (min) (ACUTE ONLY): 24 min   Charges:   PT Evaluation $Initial PT Evaluation Tier I: 1 Procedure PT Treatments $Therapeutic Activity: 8-22 mins   PT G Codes:        Rolinda Roan 2015-05-26, 3:03 PM   Rolinda Roan, PT, DPT Acute Rehabilitation Services Pager: [phone]

## 2015-05-25 NOTE — Progress Notes (Addendum)
ANTICOAGULATION CONSULT NOTE - Follow Up Consult  Pharmacy Consult for Lovenox/Warfarin Indication: atrial fibrillation  Allergies  Allergen Reactions  . Codeine Nausea And Vomiting         Patient Measurements: Height: '4\' 11"'$  (149.9 cm) Weight: 131 lb (59.421 kg) IBW/kg (Calculated) : 43.2  Vital Signs: Temp: 97.4 F (36.3 C) (10/15 0420) Temp Source: Oral (10/15 0420) BP: 128/69 mmHg (10/15 0420) Pulse Rate: 95 (10/15 0420)  Labs:  Recent Labs  05/25/15 0203 05/25/15 0533  HGB 9.5*  --   HCT 28.2*  --   PLT 290  --   LABPROT  --  17.9*  INR  --  1.47  CREATININE 2.24*  --     Estimated Creatinine Clearance: 14.1 mL/min (by C-G formula based on Cr of 2.24).   Assessment: Wendy Hopkins admitted 05/25/2015 on warfarin PTA ('2mg'$  daily) for atrial fibrillation, per Nursing Home Medication Administration Guide. Last dose 10/14. Pharmacy to dose warfarin. Will start Lovenox and continue until INR >2.  Current INR 1.47, H/H stable, plt wnl. No s/sx of bleeding noted. Recent worsening renal function SCr 2.24 (CrCl ~38m/min).   Goal of Therapy:  INR 2-3 Monitor platelets by anticoagulation protocol: Yes   Plan:  - Will give warfarin 2 mg x1 tonight - Start Lovenox 90 mg (~1.'5mg'$ /kg) - Monitor daily INR, CBC, s/sx of bleeding  TDimitri Ped PharmD.  - Spoke with MD, will bridge to coumadin with treatment dose of '1mg'$ /kg q24h. - Order changed to Lovenox '60mg'$  q24h  TDimitri Ped PharmD. Clinical Pharmacist Resident Pager: [phone]/15/2016, 11:42 AM   Addendum: to change to VTE px dose LMWH  wt 59 kg, creat cl < 30  Plan: LMWH 30 q24 until INR > 2  MEudelia Bunch Pharm.D. [phone]/15/2016 2:32 PM

## 2015-05-25 NOTE — ED Provider Notes (Signed)
CSN: [id number]     Arrival date & time 05/25/15  0102 History  By signing my name below, I, Terrance Branch, attest that this documentation has been prepared under the direction and in the presence of Everlene Balls, MD. Electronically Signed: Randa Evens, ED Scribe. 05/25/2015. 1:46 AM.     Chief Complaint  Patient presents with  . Altered Mental Status   The history is provided by the patient. No language interpreter was used.   HPI Comments: Wendy Hopkins is a 81 y.o. female with PMHx listed below who presents to the Emergency Department complaining of possible altered mental stats onset tonight. Per ems pt was in confused state after waking up. EMS states that the nursing home had to shake to wake her up. She states that yesterday night she didn't sleep well and that she just needed her sleep. Family states that she does have a pelvic fracture. Family states that she has not been on her Lasix and her swelling has gotten worse.    Past Medical History  Diagnosis Date  . Arthritis   . Hyperlipidemia   . 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 Closed fracture of unspecified part of femur 2008  . TIA (transient ischemic attack)   . Edema   . Chronic diastolic heart failure ([place])   . Osteoporosis with fracture     compression fx lower T spine and t4, L rib  . Thyroid nodule     dominate R nodule, s/p aspiration>abn path>6 mo obs planned ([facility] 06/01/11)  . GERD (gastroesophageal reflux disease)   . Hypertension 2006  . Atrial fibrillation (H[facility])     chronic anticoag; and AFlutter  . History of radiation therapy 11/30/2012-12/09/2012    50 gray to right chest  . Vertebral compression fracture [facility])     Thoracic 12 and Thoracic 7  . Adenocarcinoma, lung ([place]) dx 11/08/12    s/p CT chest bx - XRT thru 12/2012  . Diabetes mellitus, type II, insulin dependent ([place])     over 40 years ago  . Small vessel disease, cerebrovascular 05/15/2015  . Cerebral atrophy  05/15/2015  . Moderate to severe pulmonary hypertension ([place]) 05/15/2015  . Severe tricuspid regurgitation 05/15/2015   Past Surgical History  Procedure Laterality Date  . Right femur  06/25/07    Intramedullary open reduction internal fixation  . Vitrectomy  2007    right eye with cataract repair - dr. Cordelia Pen  . Tonsillectomy  1947   Family History  Problem Relation Age of Onset  . Hypertension Father     Parent not sure which 1  . Diabetes Brother   . Lung cancer Other   . Cancer Sister   . Parkinsonism Sister    Social History  Substance Use Topics  . Smoking status: Never Smoker   . Smokeless tobacco: Never Used  . Alcohol Use: No   OB History    No data available     Review of Systems A complete 10 system review of systems was obtained and all systems are negative except as noted in the HPI and PMH.     Allergies  Codeine  Home Medications   Prior to Admission medications   Medication Sig Start Date End Date Taking? Authorizing Provider  ACCU-CHEK SMARTVIEW test strip Use test strips to check blood sugar at least 3 times a day. 08/11/14   Historical Provider, MD  acetaminophen (TYLENOL) 500 MG tablet Take 500 mg by mouth every  6 (six) hours as needed.    Historical Provider, MD  atorvastatin (LIPITOR) 10 MG tablet Take 10 mg by mouth daily.  10/23/14   Historical Provider, MD  bisacodyl (BISACODYL) 5 MG EC tablet Take 1 tablet (5 mg total) by mouth daily as needed for moderate constipation. 05/15/15   Venetia Maxon Rama, MD  cholecalciferol (VITAMIN D) 1000 UNITS tablet Take 1,000 Units by mouth daily.     Historical Provider, MD  furosemide (LASIX) 40 MG tablet TAKE 2 TABLETS BY MOUTH EVERY DAY 01/01/15   Deboraha Sprang, MD  Insulin Glargine (TOUJEO SOLOSTAR) 300 UNIT/ML SOPN Inject 10 Units into the skin daily. 05/15/15   Venetia Maxon Rama, MD  insulin lispro (HUMALOG) 100 UNIT/ML injection Inject 3-4 Units into the skin 3 (three) times daily with meals. Depending on  sugar level.    Historical Provider, MD  Insulin Syringe-Needle U-100 30G X 5/16" 1 ML MISC by Does not apply route.    Historical Provider, MD  ondansetron (ZOFRAN) 4 MG tablet Take 1 tablet (4 mg total) by mouth every 6 (six) hours as needed for nausea. 05/15/15   Venetia Maxon Rama, MD  polyethylene glycol (MIRALAX / GLYCOLAX) packet Take 17 g by mouth daily. 05/15/15   Venetia Maxon Rama, MD  traMADol (ULTRAM) 50 MG tablet Take 1 tablet (50 mg total) by mouth every 6 (six) hours as needed for moderate pain. 05/15/15   Venetia Maxon Rama, MD  warfarin (COUMADIN) 5 MG tablet Take as instructed by the anticoagulation clinic Patient taking differently: Take 2.5-5 mg by mouth daily. Take 1 tablet (5 mg) daily except for Fridays Take 0.5 tablet (2.5 mg) 02/13/15   Rowe Clack, MD   BP 123/61 mmHg  Pulse 99  Temp(Src) 98.2 F (36.8 C) (Oral)  Resp 18  Ht '4\' 11"'$  (1.499 m)  Wt 129 lb (58.514 kg)  BMI 26.04 kg/m2  SpO2 97%   Physical Exam  Constitutional: She is oriented to person, place, and time. She appears well-developed and well-nourished. No distress.  HENT:  Head: Normocephalic and atraumatic.  Nose: Nose normal.  Mouth/Throat: Oropharynx is clear and moist. No oropharyngeal exudate.  Eyes: Conjunctivae and EOM are normal. Pupils are equal, round, and reactive to light. No scleral icterus.  Neck: Normal range of motion. Neck supple. No JVD present. No tracheal deviation present. No thyromegaly present.  Cardiovascular: Normal rate, regular rhythm and normal heart sounds.  Exam reveals no gallop and no friction rub.   No murmur heard. Pulmonary/Chest: Effort normal and breath sounds normal. No respiratory distress. She has no wheezes. She exhibits no tenderness.  Abdominal: Soft. Bowel sounds are normal. She exhibits no distension and no mass. There is no tenderness. There is no rebound and no guarding.  Musculoskeletal: Normal range of motion. She exhibits edema. She exhibits no  tenderness.  Bruising ecchymosis to bilateral hips, 2+ edema bilateral lower extremities.    Lymphadenopathy:    She has no cervical adenopathy.  Neurological: She is alert and oriented to person, place, and time. No cranial nerve deficit. She exhibits normal muscle tone.  Skin: Skin is warm and dry. No rash noted. No erythema. No pallor.  Nursing note and vitals reviewed.   ED Course  Procedures (including critical care time) DIAGNOSTIC STUDIES: Oxygen Saturation is 97% on RA, normal by my interpretation.    COORDINATION OF CARE: 1:38 AM-Discussed treatment plan with pt at bedside and pt agreed to plan.     Labs Review  Labs Reviewed  CBC WITH DIFFERENTIAL/PLATELET - Abnormal; Notable for the following:    RBC 3.17 (*)    Hemoglobin 9.5 (*)    HCT 28.2 (*)    Neutro Abs 8.4 (*)    All other components within normal limits  BASIC METABOLIC PANEL - Abnormal; Notable for the following:    Sodium 124 (*)    Chloride 88 (*)    Glucose, Bld 61 (*)    BUN 38 (*)    Creatinine, Ser 2.24 (*)    Calcium 8.4 (*)    GFR calc non Af Amer 19 (*)    GFR calc Af Amer 22 (*)    All other components within normal limits    Imaging Review Dg Chest 1 View  05/25/2015  CLINICAL DATA:  Weakness, altered mental status EXAM: CHEST 1 VIEW COMPARISON:  CT chest 05/14/2015 FINDINGS: Bilateral small pleural effusions, right greater than left. Right basilar airspace disease similar to the prior exam. There is bilateral interstitial thickening. There is no pneumothorax. The heart and mediastinum are stable. There is thoracic aortic atherosclerosis. There is no acute osseous abnormality. There are old right posterior rib fractures. IMPRESSION: Bilateral pleural effusions and interstitial as well as alveolar airspace opacities concerning for an element of pulmonary edema versus infection. The right lower lobe airspace disease is similar to the prior CT chest dated 05/14/2015. Electronically Signed   By:  Kathreen Devoid   On: 05/25/2015 02:16   I have personally reviewed and evaluated these images and lab results as part of my medical decision-making.   EKG Interpretation   Date/Time:  Saturday May 25 2015 01:08:12 EDT Ventricular Rate:  86 PR Interval:    QRS Duration: 83 QT Interval:  351 QTC Calculation: 420 R Axis:   128 Text Interpretation:  Atrial fibrillation Lateral infarct, age  indeterminate Probable anteroseptal infarct, old Confirmed by Glynn Octave [phone]) on 05/25/2015 1:13:17 AM      MDM   Final diagnoses:  None   Patient presents emergency department for confusion and taking a while to wake her up at the nursing facility. Here she is awake alert and oriented. She seems to have no problems with her mental state. I obtain laboratory studies and an x-ray due to the history provided by her daughter she states she has had problems with fluid overload. X-ray does show bilateral pleural effusions. Laboratory studies reveal a worsening creatinine of 2.2, hyponatremia to 124 and a glucose of 61. Patient was given an amp of D50. Patient will be admitted to the hospital for further management.    I, Laisha Rau, personally performed the services described in this documentation. All medical record entries made by the scribe were at my direction and in my presence.  I have reviewed the chart and discharge instructions and agree that the record reflects my personal performance and is accurate and complete. Bobbyjoe Pabst.  05/25/2015. 2:49 AM.       Everlene Balls, MD 05/25/15 [id number]

## 2015-05-25 NOTE — H&P (Signed)
History and Physical  Wendy Hopkins  VWU:[id number]  DOB: 1926-07-25  DOA: 05/25/2015  Referring physician: Everlene Balls, MD PCP: [name], MD   Chief Complaint: Swelling  HPI: Wendy Hopkins is a 81 y.o. female with a past medical history significant for HTN, DM, atrial fibrillation on warfarin, chronic diastolic HF, and pulmonary hypertension who presents with fluid overload.  The patient was recently admitted from October 3-5 withpubic ramus fracture nonoperatively treated for Dr. [name].  On the day of discharge the patient's creatinine went from her baseline of 1.2 to 2.7 mg/dL.  She had hyperkalemia that day which was treated with Kayexalate and holding spironolactone at discharge.  In the nursing home, the patient was continued on Kayexalate and her furosemide was stopped because there was concern that this was contributing to her renal failure.  Unfortunately over the past 10 days the patient has been increasingly edematous, with swelling in her feet, belly, and hands. Today she was short of breath and somewhat altered and so she was brought to the ED.  In the ED, the patient was hyponatremic, had persistent renal failure, and a chest x-ray with pulmonary edema and pleural effusions.      Review of Systems:  Patient seen 6:35 AM on 05/25/2015. Pt complains of dyspnea, swelling. All other systems negative except as just noted or noted in the history of present illness.  Past Medical History  Diagnosis Date  . Arthritis   . Hyperlipidemia   . 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 Closed fracture of unspecified part of femur 2008  . TIA (transient ischemic attack)   . Edema   . Chronic diastolic heart failure ([place])   . Osteoporosis with fracture     compression fx lower T spine and t4, L rib  . Thyroid nodule     dominate R nodule, s/p aspiration>abn path>6 mo obs planned ([facility] 06/01/11)  . GERD (gastroesophageal reflux disease)     . Hypertension 2006  . Atrial fibrillation (H[facility])     chronic anticoag; and AFlutter  . History of radiation therapy 11/30/2012-12/09/2012    50 gray to right chest  . Vertebral compression fracture [facility])     Thoracic 12 and Thoracic 7  . Adenocarcinoma, lung ([place]) dx 11/08/12    s/p CT chest bx - XRT thru 12/2012  . Diabetes mellitus, type II, insulin dependent ([place])     over 40 years ago  . Small vessel disease, cerebrovascular 05/15/2015  . Cerebral atrophy 05/15/2015  . Moderate to severe pulmonary hypertension ([place]) 05/15/2015  . Severe tricuspid regurgitation 05/15/2015  The above past medical history was reviewed.  Past Surgical History  Procedure Laterality Date  . Right femur  06/25/07    Intramedullary open reduction internal fixation  . Vitrectomy  2007    right eye with cataract repair - dr. Cordelia Pen  . Tonsillectomy  1947  The above surgical history was reviewed.  Social History: Patient lives at home with her husband. She has never smoked and she does not drink. She has been in a nursing home for rehabilitation after hip fracture.    Allergies  Allergen Reactions  . Codeine Nausea And Vomiting         Family History  Problem Relation Age of Onset  . Hypertension Father     Parent not sure which 1  . Diabetes Brother   . Lung cancer Other   . Cancer Sister   . Parkinsonism Sister  Prior to Admission medications   Medication Sig Start Date End Date Taking? Authorizing Provider  acetaminophen (TYLENOL) 500 MG tablet Take 500 mg by mouth every 6 (six) hours as needed for mild pain.    Yes Historical Provider, MD  atorvastatin (LIPITOR) 10 MG tablet Take 10 mg by mouth daily.  10/23/14  Yes Historical Provider, MD  bisacodyl (BISACODYL) 5 MG EC tablet Take 1 tablet (5 mg total) by mouth daily as needed for moderate constipation. 05/15/15  Yes Venetia Maxon Rama, MD  calcium carbonate (TUMS - DOSED IN MG ELEMENTAL CALCIUM) 500 MG chewable tablet Chew 1 tablet by mouth  2 (two) times daily.   Yes Historical Provider, MD  cholecalciferol (VITAMIN D) 1000 UNITS tablet Take 1,000 Units by mouth daily.    Yes Historical Provider, MD  insulin aspart (NOVOLOG) 100 UNIT/ML injection Inject 0-15 Units into the skin 4 (four) times daily as needed for high blood sugar. <150=0 units 151-200=4 units 201-250=6 units 251-300=8 units 301-350=10 units 351-400=12 units >400=15 units   Yes Historical Provider, MD  Insulin Glargine (TOUJEO SOLOSTAR) 300 UNIT/ML SOPN Inject 10 Units into the skin daily. 05/15/15  Yes Christina P Rama, MD  ondansetron (ZOFRAN) 4 MG tablet Take 1 tablet (4 mg total) by mouth every 6 (six) hours as needed for nausea. 05/15/15  Yes Venetia Maxon Rama, MD  polyethylene glycol (MIRALAX / GLYCOLAX) packet Take 17 g by mouth daily. 05/15/15  Yes Venetia Maxon Rama, MD  traMADol (ULTRAM) 50 MG tablet Take 1 tablet (50 mg total) by mouth every 6 (six) hours as needed for moderate pain. 05/15/15  Yes Venetia Maxon Rama, MD  warfarin (COUMADIN) 2 MG tablet Take 2 mg by mouth at bedtime.   Yes Historical Provider, MD  furosemide (LASIX) 40 MG tablet TAKE 2 TABLETS BY MOUTH EVERY DAY Patient not taking: Reported on 05/25/2015 01/01/15   Deboraha Sprang, MD    Physical Exam: BP 128/69 mmHg  Pulse 95  Temp(Src) 97.4 F (36.3 C) (Oral)  Resp 18  Ht '4\' 11"'$  (1.499 m)  Wt 59.421 kg (131 lb)  BMI 26.44 kg/m2  SpO2 99% General appearance: elderly female, alert and in moderate distress from dyspnea.  Responds appropriately to questions.   Eyes: Sclerae normal without icterus, conjunctiva pink, lids and lashes normal.  PERRL and EOMI.   Nose: No deformity, discharge, or epistaxis.   Mouth: OP moist without erythema, exudates, cobblestoning, or ulcers.  No airway deformities.   Skin: cool extremities.  No jaundice.  No suspicious rashes or lesions. Cardiac: irregularly irregular, nl J6-E8, systolic ejection murmur is soft.  Capillary refill is somewhat sluggish.  JVP is  elevated in the jugular veins are distended. There is marked 2+ pitting edema to the knees, and the belly is distended with fluid. Respiratory: poor respiratory effort, faint crackles heard bilaterally. Abdomen: BS present.  Abdomen soft but distended and without tenderness.   Neuro: Sensorium intact.   Speech is fluent.  Attention and concentration are normal.  Memory seems intact.   Psych: Appropriate affect.  Speech normal. Thought content/process linear/appropriate.  No evidence of aural or visual hallucinations or delusions.       Labs on Admission:  The metabolic panel is notable for hyponatremia to 124. Serum creatinine 2.24 from 2.79 at discharge. Potassium 5. The complete blood count is notable for chronic normocytic anemia.   Radiological Exams on Admission: Personally reviewed: Dg Chest 1 View  05/25/2015  CLINICAL DATA:  Weakness, altered mental  status EXAM: CHEST 1 VIEW COMPARISON:  CT chest 05/14/2015 FINDINGS: Bilateral small pleural effusions, right greater than left. Right basilar airspace disease similar to the prior exam. There is bilateral interstitial thickening. There is no pneumothorax. The heart and mediastinum are stable. There is thoracic aortic atherosclerosis. There is no acute osseous abnormality. There are old right posterior rib fractures. IMPRESSION: Bilateral pleural effusions and interstitial as well as alveolar airspace opacities concerning for an element of pulmonary edema versus infection. The right lower lobe airspace disease is similar to the prior CT chest dated 05/14/2015. Electronically Signed   By: Kathreen Devoid   On: 05/25/2015 02:16    EKG: Independently reviewed. Atrial fibrillation with rate 84.    Assessment/Plan  1. Fluid overload from acute diastolic CHF and pulmonary hypertension:  The patient's diuretics were all stopped on discharge and she was given fluids several times in the nursing home in order to treat her AKI.  She did have an  echocardiogram during her last hospitalization that showed worsening pulmonary hypertension and severe tricuspid regurgitation. -BNP -Furosemide 60 mg IV once -Strict ins and outs     2. Hyponatremia:  This is new.   Serum and urine osmolality  3. Acute renal failure:  I suspect that this is from congestion in the setting of fluid overload. Diuresis as above Urine electrolytes Closely monitor BMP  4. HTN:  controlled  5. A. Fib on warfarin:  Stable. Rate controlled. CHADS2Vasc score 4. Continue home warfarin  6. DM:  Stable.  Given hypoglycemia at admission, hold long-acting insulin Sliding scale corrections   DVT PPx: Warfarin Diet: Regular Code Status: DO NOT RESUSCITATE Family Communication: the patient's diagnosis, workup, and expected course were discussed with her daughter at the bedside. All questions were answered. CODE STATUS was clarified.   Disposition Plan:  Diuresis for now and closely following electrolytes and pain control.     At the time of admission, it appears that the appropriate admission status for this patient is INPATIENT. This is judged to be reasonable and necessary in order to provide the required intensity of service to ensure the patient's safety given the presenting symptoms, physical exam findings, and initial radiographic and laboratory data in the context of their chronic comorbidities.  Together, these circumstances are felt to place her/him at high risk for further clinical deterioration threatening life, limb, or organ.      Edwin Dada [facility] Pager [phone]

## 2015-05-25 NOTE — Progress Notes (Signed)
Called and notified Dr. Allyson Sabal that patient appears more SOB, O2 sat 97-98%, lung sounds clear to diminished. She gave telephone orders to stop IVF and give lasix 40 mg IV once.

## 2015-05-25 NOTE — ED Notes (Signed)
Per EMS nursing home called d/t pt waking up in a confused state. Per EMS nursing home had to shake pt to wake her up. Pt is A/O x4 at this time. NAD noted.

## 2015-05-25 NOTE — ED Notes (Signed)
Pt had arrived to this facility with foley catheter in place. Was placed PTA at [facility] home. MD at bedside and gave verba order to remove foley. Foley removed.

## 2015-05-26 ENCOUNTER — Inpatient Hospital Stay (HOSPITAL_COMMUNITY): Payer: Medicare Other

## 2015-05-26 DIAGNOSIS — E119 Type 2 diabetes mellitus without complications: Secondary | ICD-10-CM

## 2015-05-26 DIAGNOSIS — N179 Acute kidney failure, unspecified: Secondary | ICD-10-CM

## 2015-05-26 DIAGNOSIS — I10 Essential (primary) hypertension: Secondary | ICD-10-CM

## 2015-05-26 DIAGNOSIS — I481 Persistent atrial fibrillation: Secondary | ICD-10-CM

## 2015-05-26 DIAGNOSIS — Z794 Long term (current) use of insulin: Secondary | ICD-10-CM

## 2015-05-26 LAB — IRON AND TIBC
Iron: 34 ug/dL (ref 28–170)
Saturation Ratios: 14 % (ref 10.4–31.8)
TIBC: 238 ug/dL — ABNORMAL LOW (ref 250–450)
UIBC: 204 ug/dL

## 2015-05-26 LAB — CBC
HEMATOCRIT: 26.8 % — AB (ref 36.0–46.0)
Hemoglobin: 9 g/dL — ABNORMAL LOW (ref 12.0–15.0)
MCH: 29.8 pg (ref 26.0–34.0)
MCHC: 33.6 g/dL (ref 30.0–36.0)
MCV: 88.7 fL (ref 78.0–100.0)
PLATELETS: 305 10*3/uL (ref 150–400)
RBC: 3.02 MIL/uL — ABNORMAL LOW (ref 3.87–5.11)
RDW: 15.4 % (ref 11.5–15.5)
WBC: 7.4 10*3/uL (ref 4.0–10.5)

## 2015-05-26 LAB — PROTIME-INR
INR: 1.41 (ref 0.00–1.49)
Prothrombin Time: 17.3 seconds — ABNORMAL HIGH (ref 11.6–15.2)

## 2015-05-26 LAB — VITAMIN B12: VITAMIN B 12: 240 pg/mL (ref 180–914)

## 2015-05-26 LAB — COMPREHENSIVE METABOLIC PANEL
ALK PHOS: 94 U/L (ref 38–126)
ALT: 13 U/L — AB (ref 14–54)
AST: 26 U/L (ref 15–41)
Albumin: 2.3 g/dL — ABNORMAL LOW (ref 3.5–5.0)
Anion gap: 7 (ref 5–15)
BUN: 32 mg/dL — AB (ref 6–20)
CALCIUM: 7.9 mg/dL — AB (ref 8.9–10.3)
CHLORIDE: 85 mmol/L — AB (ref 101–111)
CO2: 31 mmol/L (ref 22–32)
CREATININE: 2.04 mg/dL — AB (ref 0.44–1.00)
GFR, EST AFRICAN AMERICAN: 24 mL/min — AB (ref >60)
GFR, EST NON AFRICAN AMERICAN: 21 mL/min — AB (ref >60)
Glucose, Bld: 81 mg/dL (ref 65–99)
Potassium: 4.1 mmol/L (ref 3.5–5.1)
Sodium: 123 mmol/L — ABNORMAL LOW (ref 135–145)
Total Bilirubin: 1 mg/dL (ref 0.3–1.2)
Total Protein: 4.8 g/dL — ABNORMAL LOW (ref 6.5–8.1)

## 2015-05-26 LAB — OCCULT BLOOD X 1 CARD TO LAB, STOOL: Fecal Occult Bld: NEGATIVE

## 2015-05-26 LAB — GLUCOSE, CAPILLARY
GLUCOSE-CAPILLARY: 451 mg/dL — AB (ref 65–99)
Glucose-Capillary: 120 mg/dL — ABNORMAL HIGH (ref 65–99)
Glucose-Capillary: 323 mg/dL — ABNORMAL HIGH (ref 65–99)
Glucose-Capillary: 424 mg/dL — ABNORMAL HIGH (ref 65–99)
Glucose-Capillary: 336 mg/dL — ABNORMAL HIGH (ref 65–99)

## 2015-05-26 LAB — FERRITIN: FERRITIN: 202 ng/mL (ref 11–307)

## 2015-05-26 LAB — UREA NITROGEN, URINE: Urea Nitrogen, Ur: 304 mg/dL

## 2015-05-26 MED ORDER — INSULIN GLARGINE 100 UNIT/ML ~~LOC~~ SOLN
22.0000 [IU] | Freq: Every day | SUBCUTANEOUS | Status: DC
  Administered 2015-05-26: 22 [IU] via SUBCUTANEOUS
  Filled 2015-05-26 (×2): qty 0.22

## 2015-05-26 MED ORDER — FUROSEMIDE 10 MG/ML IJ SOLN
80.0000 mg | Freq: Three times a day (TID) | INTRAMUSCULAR | Status: DC
  Administered 2015-05-26: 80 mg via INTRAVENOUS
  Filled 2015-05-26: qty 8

## 2015-05-26 MED ORDER — OXYCODONE HCL 5 MG PO TABS: 2.5000 mg | ORAL_TABLET | ORAL | Status: DC | PRN

## 2015-05-26 MED ORDER — FUROSEMIDE 10 MG/ML IJ SOLN
80.0000 mg | Freq: Three times a day (TID) | INTRAMUSCULAR | Status: DC
  Administered 2015-05-27 (×2): 80 mg via INTRAVENOUS
  Filled 2015-05-26 (×2): qty 8

## 2015-05-26 MED ORDER — FUROSEMIDE 10 MG/ML IJ SOLN
20.0000 mg | Freq: Three times a day (TID) | INTRAMUSCULAR | Status: DC
  Administered 2015-05-26: 20 mg via INTRAVENOUS
  Filled 2015-05-26: qty 2

## 2015-05-26 MED ORDER — WARFARIN SODIUM 5 MG PO TABS
5.0000 mg | ORAL_TABLET | Freq: Once | ORAL | Status: AC
  Administered 2015-05-26: 5 mg via ORAL
  Filled 2015-05-26: qty 1

## 2015-05-26 NOTE — Progress Notes (Signed)
Utilization Review Completed.Damaris Geers T10/16/2016  

## 2015-05-26 NOTE — Progress Notes (Signed)
ANTICOAGULATION CONSULT NOTE - Follow Up Consult  Pharmacy Consult for coumadin Indication: atrial fibrillation  Allergies  Allergen Reactions  . Codeine Nausea And Vomiting         Patient Measurements: Height: '4\' 11"'$  (149.9 cm) Weight: 134 lb 7.7 oz (61 kg) IBW/kg (Calculated) : 43.2  Vital Signs: Temp: 97.7 F (36.5 C) (10/16 0802) Temp Source: Oral (10/16 0802) BP: 125/63 mmHg (10/16 0802) Pulse Rate: 99 (10/16 0802)  Labs:  Recent Labs  05/25/15 0203 05/25/15 0533 05/25/15 1210 05/26/15 0555  HGB 9.5*  --   --  9.0*  HCT 28.2*  --   --  26.8*  PLT 290  --   --  305  LABPROT  --  17.9*  --  17.3*  INR  --  1.47  --  1.41  CREATININE 2.24*  --  2.10* 2.04*    Estimated Creatinine Clearance: 15.7 mL/min (by C-G formula based on Cr of 2.04).   Assessment: 81 yo F on warf 2 mg daily pta for afib. Dr Allyson Sabal requested VTE px dose LMWH until INR > 2. Coumadin clinic note 04/17/15 INR 1.8, told to take 7.5 mg 9/7 then 5 mg daily x 2.5 mg on Fridays.  on admission 10/15 coum dose from SNF was 2 mg daily   INR 1.41 today. CBC stable, no bleeding reported.    Goal of Therapy:  INR 2-3 Monitor platelets by anticoagulation protocol: Yes   Plan:  - coumadin 5 mg po x 1 dose today - LMWH 30 q24 until INR > 2 -daily INR  Eudelia Bunch, Pharm.D. [phone] 05/26/2015 10:50 AM

## 2015-05-26 NOTE — Progress Notes (Signed)
Pt SOB while eating dinner, HOB elevated as high as pt could tolerate, as she has low back/pelvic pain.  O2 sats are 100% on 2 L via [place], HR tachycardic in the 120s, Lasix was given at 1711 with no output yet.   Also, pt BGL was 425 after 9 units of Novolog given just prior to dinner.    MD notified of the aforementioned information.   Monitor urine output and CXR for SOB, Oxy IR for pain, and  22 units of Lantus for BGL were ordered.  Will monitor.

## 2015-05-26 NOTE — Progress Notes (Addendum)
[facility] PROGRESS NOTE  Wendy Hopkins LKJ:[id number] DOB: 18-Jan-1926 DOA: 05/25/2015 PCP: [name], MD  Length of stay: 1   Assessment/Plan: Principal Problem:   Acute on chronic diastolic (congestive) heart failure ([place]) Active Problems:   Diabetes mellitus type 2, insulin dependent (HCC)   ANEMIA, SECONDARY TO BLOOD LOSS   Essential hypertension   Atrial fibrillation ([place])   Long term current use of anticoagulant   Severe tricuspid regurgitation   Moderate to severe pulmonary hypertension (HCC)   Fluid overload   AKI (acute kidney injury) ([place])   Pressure ulcer    Brief summary Wendy Hopkins is a 81 y.o. female with a past medical history significant for HTN, DM, atrial fibrillation on warfarin, chronic diastolic HF, and pulmonary hypertension who presents with fluid overload.  The patient was recently admitted from October 3-5 withpubic ramus fracture nonoperatively treated for Dr. [name]. On the day of discharge the patient's creatinine went from her baseline of 1.2 to 2.7 mg/dL. She had hyperkalemia that day which was treated with Kayexalate and holding spironolactone at discharge.  In the nursing home, the patient was continued on Kayexalate and her furosemide was stopped because there was concern that this was contributing to her renal failure. Unfortunately over the past 10 days the patient has been increasingly edematous, with swelling in her feet, belly, and hands. Today she was short of breath and somewhat altered and so she was brought to the ED.  In the ED, the patient was hyponatremic, had persistent renal failure, and a chest x-ray with pulmonary edema and pleural effusions    Assessment and plan  1. Acute on chronic diastolic CHF and pulmonary hypertension:  The patient's diuretics were all stopped on discharge and she was given fluids several times in the nursing home in order to treat her AKI. She did have an echocardiogram  during her last hospitalization that showed worsening pulmonary hypertension and severe tricuspid regurgitation. EF of 60-65% -BNP-420 Patient's renal function is improving with slow diuresis, diuresis complicated by hyponatremia  Patient placed on fluid restriction, continue slow diuresis with Lasix 20 mg IV every 8 Appreciate nephrology input in the management of renal failure and diuresis in the setting of hyponatremia  2. Hypervolemic Hyponatremia:  Sodium was 140 on 10/4, 127 on 10/5, now 123  Urine osmolality 277, serum osmolality 268 Nephrology consultation given worsening hyponatremia with diuresis  3. Acute renal failure:  Some improvement overnight with IV Lasix, continue slow diuresis  4. HTN:  controlled  5. A. Fib on warfarin:  Stable. Rate controlled. CHADS2Vasc score 4. Continue home warfarin  6. DM:  Stable continue sliding scale insulin, start lantus   DVT prophylaxsis Lovenox  Code Status:      Code Status Orders        Start     Ordered   05/25/15 0428  Do not attempt resuscitation (DNR)   Continuous    Question Answer Comment  In the event of cardiac or respiratory ARREST Do not call a "code blue"   In the event of cardiac or respiratory ARREST Do not perform Intubation, CPR, defibrillation or ACLS   In the event of cardiac or respiratory ARREST Use medication by any route, position, wound care, and other measures to relive pain and suffering. May use oxygen, suction and manual treatment of airway obstruction as needed for comfort.      05/25/15 0427    Advance Directive Documentation  Most Recent Value   Type of Advance Directive  Healthcare Power of Attorney, Out of facility DNR (pink MOST or yellow form)   Pre-existing out of facility DNR order (yellow form or pink MOST form)  Physician notified to receive inpatient order   "MOST" Form in Place?       Family Communication: family updated about patient's clinical progress Disposition  Plan:  As above     Consultants:  Nephrology  Procedures:  None  Antibiotics: Anti-infectives    None         HPI/Subjective: Subjective he denies any chest pain but does feel somewhat short of breath and gets short winded with minimal activity  Objective: Filed Vitals:   05/25/15 2026 05/25/15 2041 05/26/15 0515 05/26/15 0802  BP:  127/62 132/66 125/63  Pulse:  100 98 99  Temp:  98 F (36.7 C) 98.2 F (36.8 C) 97.7 F (36.5 C)  TempSrc:  Oral Oral Oral  Resp:  '20 16 16  '$ Height:      Weight:  61 kg (134 lb 7.7 oz)    SpO2: 98% 100% 100% 100%    Intake/Output Summary (Last 24 hours) at 05/26/15 0258 Last data filed at 05/26/15 0707  Gross per 24 hour  Intake 566.25 ml  Output    652 ml  Net -85.75 ml    Exam:  General: No acute respiratory distress Lungs: Clear to auscultation bilaterally without wheezes or crackles Cardiovascular: Regular rate and rhythm without murmur gallop or rub normal S1 and S2, by basilar crackles Abdomen: Nontender, nondistended, soft, bowel sounds positive, no rebound, no ascites, no appreciable mass Extremities: No significant cyanosis, clubbing, or edema bilateral lower extremities     Data Review   Micro Results Recent Results (from the past 240 hour(s))  MRSA PCR Screening     Status: None   Collection Time: 05/25/15  6:26 PM  Result Value Ref Range Status   MRSA by PCR NEGATIVE NEGATIVE Final    Comment:        The GeneXpert MRSA Assay (FDA approved for NASAL specimens only), is one component of a comprehensive MRSA colonization surveillance program. It is not intended to diagnose MRSA infection nor to guide or monitor treatment for MRSA infections.     Radiology Reports Dg Chest 1 View  05/25/2015  CLINICAL DATA:  Weakness, altered mental status EXAM: CHEST 1 VIEW COMPARISON:  CT chest 05/14/2015 FINDINGS: Bilateral small pleural effusions, right greater than left. Right basilar airspace disease similar  to the prior exam. There is bilateral interstitial thickening. There is no pneumothorax. The heart and mediastinum are stable. There is thoracic aortic atherosclerosis. There is no acute osseous abnormality. There are old right posterior rib fractures. IMPRESSION: Bilateral pleural effusions and interstitial as well as alveolar airspace opacities concerning for an element of pulmonary edema versus infection. The right lower lobe airspace disease is similar to the prior CT chest dated 05/14/2015. Electronically Signed   By: Kathreen Devoid   On: 05/25/2015 02:16   Ct Head Wo Contrast  05/14/2015  CLINICAL DATA:  [place].  Anticoagulated. EXAM: CT HEAD WITHOUT CONTRAST CT CERVICAL SPINE WITHOUT CONTRAST TECHNIQUE: Multidetector CT imaging of the head and cervical spine was performed following the standard protocol without intravenous contrast. Multiplanar CT image reconstructions of the cervical spine were also generated. COMPARISON:  None. FINDINGS: CT HEAD FINDINGS There is no intracranial hemorrhage or extra-axial fluid collection. There is moderately severe generalized atrophy. There is mild white matter hypodensity  consistent with chronic small vessel disease. Calvarium and skullbase are intact. CT CERVICAL SPINE FINDINGS The vertebral column, pedicles and facet articulations are intact. There is no evidence of acute fracture. No acute soft tissue abnormalities are evident. Mild arthritic changes are evident in the midcervical spine. Severe atlantoaxial degenerative changes are present. IMPRESSION: 1. Negative for acute intracranial traumatic injury. There is moderately severe generalized atrophy and chronic small vessel disease. 2. Negative for acute cervical spine fracture Electronically Signed   By: Andreas Newport M.D.   On: 05/14/2015 02:40   Ct Chest W Contrast  05/14/2015  CLINICAL DATA:  Lower back and hip pain after falling. EXAM: CT CHEST, ABDOMEN, AND PELVIS WITH CONTRAST TECHNIQUE: Multidetector CT  imaging of the chest, abdomen and pelvis was performed following the standard protocol during bolus administration of intravenous contrast. CONTRAST:  46m OMNIPAQUE IOHEXOL 300 MG/ML  SOLN COMPARISON:  01/03/2015, 11/30/2014, 04/24/2014. FINDINGS: CT CHEST FINDINGS Mediastinum/Nodes: The mediastinum and intrathoracic vascular structures are intact. There is no mediastinal hemorrhage. Lungs/Pleura: There is no pneumothorax. There is no effusion. There is unchanged right lower lobe collapse and consolidation. Musculoskeletal: No acute fracture is evident. There is chronic moderately displaced fracture of the sternum. There is severe thoracic kyphosis with chronic severe compressions of T6-T8, compression of T12, and moderate superior endplate impaction at L3. These are unchanged. Prior kyphoplasties again noted in the thoracic spine. Multiple chronic rib fracture deformities are present. CT ABDOMEN PELVIS FINDINGS Hepatobiliary: A few unchanged sub cm hepatic hypodensities are present. There is no hepatic laceration. Pancreas: Unremarkable Spleen: Normal except for a small unchanged hypodense focus anteriorly. Adrenals/Urinary Tract: Adrenals and kidneys are unremarkable. Ureters are unremarkable. Urinary bladder is moderately distended but appears intact. Stomach/Bowel: Bowel appears intact. No peritoneal free air or blood. Vascular/Lymphatic: The abdominal aorta is normal in caliber with extensive atherosclerotic calcification. Reproductive: Unremarkable Musculoskeletal: There is diastases of the left sacroiliac joint. There are fractures of the left iliac bone adjacent to the sacroiliac joint, minimally displaced. There are moderately displaced left superior and inferior pubic rami fractures. The hips and acetabuli are intact. Pubic symphysis is intact. IMPRESSION: 1. Negative for acute intrathoracic traumatic injury. 2. There are acute pelvic fractures including the left pubic rami, and left iliac bone adjacent  to the SI joint. There is diastases of the left SI joint. 3. No significant hematoma associated with these pelvic fractures. 4. No evidence of bowel or intra-abdominal parenchymal organ injury. Electronically Signed   By: DAndreas NewportM.D.   On: 05/14/2015 03:00   Ct Cervical Spine Wo Contrast  05/14/2015  CLINICAL DATA:  [place]  Anticoagulated. EXAM: CT HEAD WITHOUT CONTRAST CT CERVICAL SPINE WITHOUT CONTRAST TECHNIQUE: Multidetector CT imaging of the head and cervical spine was performed following the standard protocol without intravenous contrast. Multiplanar CT image reconstructions of the cervical spine were also generated. COMPARISON:  None. FINDINGS: CT HEAD FINDINGS There is no intracranial hemorrhage or extra-axial fluid collection. There is moderately severe generalized atrophy. There is mild white matter hypodensity consistent with chronic small vessel disease. Calvarium and skullbase are intact. CT CERVICAL SPINE FINDINGS The vertebral column, pedicles and facet articulations are intact. There is no evidence of acute fracture. No acute soft tissue abnormalities are evident. Mild arthritic changes are evident in the midcervical spine. Severe atlantoaxial degenerative changes are present. IMPRESSION: 1. Negative for acute intracranial traumatic injury. There is moderately severe generalized atrophy and chronic small vessel disease. 2. Negative for acute cervical spine fracture Electronically Signed  By: Andreas Newport M.D.   On: 05/14/2015 02:40   Ct Abdomen Pelvis W Contrast  05/14/2015  CLINICAL DATA:  Lower back and hip pain after falling. EXAM: CT CHEST, ABDOMEN, AND PELVIS WITH CONTRAST TECHNIQUE: Multidetector CT imaging of the chest, abdomen and pelvis was performed following the standard protocol during bolus administration of intravenous contrast. CONTRAST:  40m OMNIPAQUE IOHEXOL 300 MG/ML  SOLN COMPARISON:  01/03/2015, 11/30/2014, 04/24/2014. FINDINGS: CT CHEST FINDINGS  Mediastinum/Nodes: The mediastinum and intrathoracic vascular structures are intact. There is no mediastinal hemorrhage. Lungs/Pleura: There is no pneumothorax. There is no effusion. There is unchanged right lower lobe collapse and consolidation. Musculoskeletal: No acute fracture is evident. There is chronic moderately displaced fracture of the sternum. There is severe thoracic kyphosis with chronic severe compressions of T6-T8, compression of T12, and moderate superior endplate impaction at L3. These are unchanged. Prior kyphoplasties again noted in the thoracic spine. Multiple chronic rib fracture deformities are present. CT ABDOMEN PELVIS FINDINGS Hepatobiliary: A few unchanged sub cm hepatic hypodensities are present. There is no hepatic laceration. Pancreas: Unremarkable Spleen: Normal except for a small unchanged hypodense focus anteriorly. Adrenals/Urinary Tract: Adrenals and kidneys are unremarkable. Ureters are unremarkable. Urinary bladder is moderately distended but appears intact. Stomach/Bowel: Bowel appears intact. No peritoneal free air or blood. Vascular/Lymphatic: The abdominal aorta is normal in caliber with extensive atherosclerotic calcification. Reproductive: Unremarkable Musculoskeletal: There is diastases of the left sacroiliac joint. There are fractures of the left iliac bone adjacent to the sacroiliac joint, minimally displaced. There are moderately displaced left superior and inferior pubic rami fractures. The hips and acetabuli are intact. Pubic symphysis is intact. IMPRESSION: 1. Negative for acute intrathoracic traumatic injury. 2. There are acute pelvic fractures including the left pubic rami, and left iliac bone adjacent to the SI joint. There is diastases of the left SI joint. 3. No significant hematoma associated with these pelvic fractures. 4. No evidence of bowel or intra-abdominal parenchymal organ injury. Electronically Signed   By: DAndreas NewportM.D.   On: 05/14/2015 03:00    Dg Hip Unilat With Pelvis 2-3 Views Left  05/13/2015  CLINICAL DATA:  [place] EXAM: DG HIP (WITH OR WITHOUT PELVIS) 2-3V LEFT COMPARISON:  None. FINDINGS: There are fractures of the left superior and inferior pubic rami with displacement. Bones are osteopenic. The left femoral neck is grossly intact. IMPRESSION: Osteopenia Displaced fractures of the left superior and inferior pubic rami. Electronically Signed   By: AMarybelle KillingsM.D.   On: 05/13/2015 23:38     CBC  Recent Labs Lab 05/25/15 0203 05/26/15 0555  WBC 10.1 7.4  HGB 9.5* 9.0*  HCT 28.2* 26.8*  PLT 290 305  MCV 89.0 88.7  MCH 30.0 29.8  MCHC 33.7 33.6  RDW 14.8 15.4  LYMPHSABS 0.7  --   MONOABS 0.8  --   EOSABS 0.1  --   BASOSABS 0.0  --     Chemistries   Recent Labs Lab 05/25/15 0203 05/25/15 1210 05/26/15 0555  NA 124* 125* 123*  K 5.0 4.4 4.1  CL 88* 89* 85*  CO2 '29 29 31  '$ GLUCOSE 61* 119* 81  BUN 38* 35* 32*  CREATININE 2.24* 2.10* 2.04*  CALCIUM 8.4* 8.0* 7.9*  AST  --   --  26  ALT  --   --  13*  ALKPHOS  --   --  94  BILITOT  --   --  1.0   ------------------------------------------------------------------------------------------------------------------ estimated creatinine clearance is 15.7  mL/min (by C-G formula based on Cr of 2.04). ------------------------------------------------------------------------------------------------------------------ No results for input(s): HGBA1C in the last 72 hours. ------------------------------------------------------------------------------------------------------------------ No results for input(s): CHOL, HDL, LDLCALC, TRIG, CHOLHDL, LDLDIRECT in the last 72 hours. ------------------------------------------------------------------------------------------------------------------  Recent Labs  05/25/15 1210  TSH 3.939   ------------------------------------------------------------------------------------------------------------------ No results for  input(s): VITAMINB12, FOLATE, FERRITIN, TIBC, IRON, RETICCTPCT in the last 72 hours.  Coagulation profile  Recent Labs Lab 05/25/15 0533 05/26/15 0555  INR 1.47 1.41    No results for input(s): DDIMER in the last 72 hours.  Cardiac Enzymes No results for input(s): CKMB, TROPONINI, MYOGLOBIN in the last 168 hours.  Invalid input(s): CK ------------------------------------------------------------------------------------------------------------------ Invalid input(s): POCBNP   CBG:  Recent Labs Lab 05/25/15 0755 05/25/15 1254 05/25/15 1707 05/25/15 2039 05/26/15 0800  GLUCAP 77 89 249* 263* 120*       Studies: Dg Chest 1 View  05/25/2015  CLINICAL DATA:  Weakness, altered mental status EXAM: CHEST 1 VIEW COMPARISON:  CT chest 05/14/2015 FINDINGS: Bilateral small pleural effusions, right greater than left. Right basilar airspace disease similar to the prior exam. There is bilateral interstitial thickening. There is no pneumothorax. The heart and mediastinum are stable. There is thoracic aortic atherosclerosis. There is no acute osseous abnormality. There are old right posterior rib fractures. IMPRESSION: Bilateral pleural effusions and interstitial as well as alveolar airspace opacities concerning for an element of pulmonary edema versus infection. The right lower lobe airspace disease is similar to the prior CT chest dated 05/14/2015. Electronically Signed   By: Kathreen Devoid   On: 05/25/2015 02:16      Lab Results  Component Value Date   HGBA1C 8.9* 11/07/2014   HGBA1C 8.7* 01/23/2014   HGBA1C 8.0* 11/08/2013   Lab Results  Component Value Date   LDLCALC 75 11/07/2014   CREATININE 2.04* 05/26/2015       Scheduled Meds: . atorvastatin  10 mg Oral Daily  . enoxaparin (LOVENOX) injection  30 mg Subcutaneous Q24H  . furosemide  20 mg Intravenous Q8H  . insulin aspart  0-5 Units Subcutaneous QHS  . insulin aspart  0-9 Units Subcutaneous TID WC  . sodium  chloride  3 mL Intravenous Q12H  . Warfarin - Pharmacist Dosing Inpatient   Does not apply q1800   Continuous Infusions:   Principal Problem:   Acute on chronic diastolic (congestive) heart failure (HCC) Active Problems:   Diabetes mellitus type 2, insulin dependent (HCC)   ANEMIA, SECONDARY TO BLOOD LOSS   Essential hypertension   Atrial fibrillation (HCC)   Long term current use of anticoagulant   Severe tricuspid regurgitation   Moderate to severe pulmonary hypertension (HCC)   Fluid overload   AKI (acute kidney injury) ([place])   Pressure ulcer    Time spent: 45 minutes   [facility] Pager [phone]. If 7PM-7AM, please contact night-coverage at [email or web address], password [facility] 05/26/2015, 8:21 AM  LOS: 1 day

## 2015-05-26 NOTE — Consult Note (Signed)
Reason for Consult:  Hyponatremia and AKI Referring Physician: Allyson Sabal, MD  Wendy Hopkins is an 81 y.o. female.  HPI: Pt is an 81yo WF with PMH sig for DM, chronic a fib, HTN, h/o lung cancer, moderate to severe pulmonary HTN, diastolic CHF, and recent admission for pelvic fracture following a fall at home (discharged 05/15/15) who presented to [facility] on 05/25/15 with 10 day h/o increasing edema and SOB.  She developed hyperkalemia and AKI during her last admission and was sent to a SNF on kayexalate and lasix.  Her lasix was held due to concern that it was contributing to her AKI and subsequently developed worsening edema and SOB resulting in her readmission.  We were asked to help evaluate and manage her AKI as well as her hyponatremia which has since worsened since her discharge.  The trends in sodium and creatinine are seen below.  Of note, she had been hypotensive during her last hospitalization and diuretics were held due to drop in BP and rise in Scr, however it continued to climb at time of discharge.  Trend in Creatinine:  CREATININE, SER  Date/Time Value Ref Range Status  05/26/2015 05:55 AM 2.04* 0.44 - 1.00 mg/dL Final  05/25/2015 12:10 PM 2.10* 0.44 - 1.00 mg/dL Final  05/25/2015 02:03 AM 2.24* 0.44 - 1.00 mg/dL Final  05/15/2015 05:15 AM 2.79* 0.44 - 1.00 mg/dL Final  05/14/2015 08:50 AM 1.51* 0.44 - 1.00 mg/dL Final  05/14/2015 01:12 AM 1.20* 0.44 - 1.00 mg/dL Final  05/13/2015 11:49 PM 1.20* 0.44 - 1.00 mg/dL Final  11/30/2014 12:21 PM 1.3* 0.6 - 1.1 mg/dL Final  08/27/2014 12:46 PM 1.1 0.6 - 1.1 mg/dL Final  06/19/2014 10:43 AM 1.0 0.4 - 1.2 mg/dL Final  04/19/2014 10:19 AM 1.1 0.4 - 1.2 mg/dL Final  03/27/2014 01:14 PM 1.2 0.4 - 1.2 mg/dL Final  02/19/2014 11:55 AM 1.1 0.4 - 1.2 mg/dL Final  01/23/2014 11:22 AM 1.2 0.4 - 1.2 mg/dL Final  11/08/2013 09:45 AM 1.2 0.4 - 1.2 mg/dL Final  10/13/2013 03:18 PM 1.1 0.4 - 1.2 mg/dL Final  08/08/2013 04:05 PM 1.0 0.4 - 1.2 mg/dL  Final  06/13/2013 09:10 AM 0.94 0.50 - 1.10 mg/dL Final  07/30/2012 03:27 PM 0.95 0.50 - 1.10 mg/dL Final  03/14/2012 10:26 AM 1.1 0.4 - 1.2 mg/dL Final  02/26/2012 11:28 AM 1.0 0.4 - 1.2 mg/dL Final  07/24/2011 12:51 PM 1.0 0.4 - 1.2 mg/dL Final  10/28/2010 06:11 PM 1.1 0.4 - 1.2 mg/dL Final  08/01/2010 1.1 mg/dL   05/26/2010 1.0 mg/dL   08/12/2009 12:57 PM 0.95 0.4 - 1.2 mg/dL Final  08/08/2009 10:42 AM 0.9 0.4-1.2 mg/dL Final  06/27/2007 04:00 AM 0.86  Final  06/26/2007 04:41 AM 0.89  Final  06/25/2007 03:51 PM 0.77  Final   SODIUM  Date/Time Value Ref Range Status  05/26/2015 05:55 AM 123* 135 - 145 mmol/L Final  05/25/2015 12:10 PM 125* 135 - 145 mmol/L Final  05/25/2015 02:03 AM 124* 135 - 145 mmol/L Final  05/15/2015 05:15 AM 127* 135 - 145 mmol/L Final  05/14/2015 08:50 AM 134* 135 - 145 mmol/L Final  05/14/2015 01:12 AM 140 135 - 145 mmol/L Final  05/13/2015 11:49 PM 140 135 - 145 mmol/L Final  06/19/2014 10:43 AM 131* 135 - 145 mEq/L Final  04/19/2014 10:19 AM 131* 135 - 145 mEq/L Final  03/27/2014 01:14 PM 125* 135 - 145 mEq/L Final  02/19/2014 11:55 AM 131* 135 - 145 mEq/L Final  01/23/2014 11:22  AM 132* 135 - 145 mEq/L Final  11/08/2013 09:45 AM 132* 135 - 145 mEq/L Final  10/13/2013 03:18 PM 132* 135 - 145 mEq/L Final  08/08/2013 04:05 PM 137 135 - 145 mEq/L Final  07/17/2013 130* 137 - 147 mmol/L Final  06/13/2013 09:10 AM 127* 135 - 145 mEq/L Final  07/30/2012 03:27 PM 124* 135 - 145 mEq/L Final  03/14/2012 10:26 AM 135 135 - 145 mEq/L Final  02/26/2012 11:28 AM 135 135 - 145 mEq/L Final  08/24/2011 141 137 - 147 mmol/L   07/24/2011 12:51 PM 135 135 - 145 mEq/L Final  10/28/2010 06:11 PM 132* 135 - 145 mEq/L Final  08/01/2010 134 meq/L   05/26/2010 132 meq/L   08/12/2009 12:57 PM 133* 135 - 145 mEq/L Final  08/08/2009 10:42 AM 138 135-145 meq/L Final  06/27/2007 04:00 AM 131*  Final  06/26/2007 04:41 AM 131*  Final  06/25/2007 03:51 PM 135  Final   PMH:    Past Medical History  Diagnosis Date  . Arthritis   . Hyperlipidemia   . 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 Closed fracture of unspecified part of femur 2008  . TIA (transient ischemic attack)   . Edema   . Chronic diastolic heart failure ([place])   . Osteoporosis with fracture     compression fx lower T spine and t4, L rib  . Thyroid nodule     dominate R nodule, s/p aspiration>abn path>6 mo obs planned ([place] 06/01/11)  . GERD (gastroesophageal reflux disease)   . Hypertension 2006  . Atrial fibrillation ([facility])     chronic anticoag; and AFlutter  . History of radiation therapy 11/30/2012-12/09/2012    50 gray to right chest  . Vertebral compression fracture [facility])     Thoracic 12 and Thoracic 7  . Adenocarcinoma, lung ([place]) dx 11/08/12    s/p CT chest bx - XRT thru 12/2012  . Diabetes mellitus, type II, insulin dependent ([place])     over 40 years ago  . Small vessel disease, cerebrovascular 05/15/2015  . Cerebral atrophy 05/15/2015  . Moderate to severe pulmonary hypertension ([place]) 05/15/2015  . Severe tricuspid regurgitation 05/15/2015    PSH:   Past Surgical History  Procedure Laterality Date  . Right femur  06/25/07    Intramedullary open reduction internal fixation  . Vitrectomy  2007    right eye with cataract repair - dr. Cordelia Pen  . Tonsillectomy  1947    Allergies:  Allergies  Allergen Reactions  . Codeine Nausea And Vomiting         Medications:   Prior to Admission medications   Medication Sig Start Date End Date Taking? Authorizing Provider  acetaminophen (TYLENOL) 500 MG tablet Take 500 mg by mouth every 6 (six) hours as needed for mild pain.    Yes Historical Provider, MD  atorvastatin (LIPITOR) 10 MG tablet Take 10 mg by mouth daily.  10/23/14  Yes Historical Provider, MD  bisacodyl (BISACODYL) 5 MG EC tablet Take 1 tablet (5 mg total) by mouth daily as needed for moderate constipation. 05/15/15  Yes Venetia Maxon Rama, MD  calcium  carbonate (TUMS - DOSED IN MG ELEMENTAL CALCIUM) 500 MG chewable tablet Chew 1 tablet by mouth 2 (two) times daily.   Yes Historical Provider, MD  cholecalciferol (VITAMIN D) 1000 UNITS tablet Take 1,000 Units by mouth daily.    Yes Historical Provider, MD  insulin aspart (NOVOLOG) 100 UNIT/ML injection Inject 0-15 Units into the  skin 4 (four) times daily as needed for high blood sugar. <150=0 units 151-200=4 units 201-250=6 units 251-300=8 units 301-350=10 units 351-400=12 units >400=15 units   Yes Historical Provider, MD  Insulin Glargine (TOUJEO SOLOSTAR) 300 UNIT/ML SOPN Inject 10 Units into the skin daily. 05/15/15  Yes Christina P Rama, MD  ondansetron (ZOFRAN) 4 MG tablet Take 1 tablet (4 mg total) by mouth every 6 (six) hours as needed for nausea. 05/15/15  Yes Venetia Maxon Rama, MD  polyethylene glycol (MIRALAX / GLYCOLAX) packet Take 17 g by mouth daily. 05/15/15  Yes Venetia Maxon Rama, MD  traMADol (ULTRAM) 50 MG tablet Take 1 tablet (50 mg total) by mouth every 6 (six) hours as needed for moderate pain. 05/15/15  Yes Venetia Maxon Rama, MD  warfarin (COUMADIN) 2 MG tablet Take 2 mg by mouth at bedtime.   Yes Historical Provider, MD  furosemide (LASIX) 40 MG tablet TAKE 2 TABLETS BY MOUTH EVERY DAY Patient not taking: Reported on 05/25/2015 01/01/15   Deboraha Sprang, MD    Discontinued Meds:   Medications Discontinued During This Encounter  Medication Reason  . warfarin (COUMADIN) 5 MG tablet Dose change  . ACCU-CHEK SMARTVIEW test strip Inpatient Standard  . insulin lispro (HUMALOG) 100 UNIT/ML injection Change in therapy  . Insulin Syringe-Needle U-100 30G X 5/16" 1 ML MISC Inpatient Standard  . enoxaparin (LOVENOX) injection 90 mg   . enoxaparin (LOVENOX) injection 60 mg Dose change  . 0.9 %  sodium chloride infusion     Social History:  reports that she has never smoked. She has never used smokeless tobacco. She reports that she does not drink alcohol or use illicit  drugs.  Family History:   Family History  Problem Relation Age of Onset  . Hypertension Father     Parent not sure which 1  . Diabetes Brother   . Lung cancer Other   . Cancer Sister   . Parkinsonism Sister     A comprehensive review of systems was negative except for: Constitutional: positive for fatigue and malaise Respiratory: positive for dyspnea on exertion Cardiovascular: positive for dyspnea and lower extremity edema Musculoskeletal: positive for pelvic pain  Blood pressure 125/63, pulse 99, temperature 97.7 F (36.5 C), temperature source Oral, resp. rate 16, height '4\' 11"'$  (1.499 m), weight 61 kg (134 lb 7.7 oz), SpO2 100 %. General appearance: fatigued and mild distress Head: Normocephalic, without obvious abnormality, atraumatic Eyes: negative findings: lids and lashes normal, conjunctivae and sclerae normal and corneas clear Neck: no adenopathy, no carotid bruit, no JVD, supple, symmetrical, trachea midline and thyroid not enlarged, symmetric, no tenderness/mass/nodules Resp: clear to auscultation bilaterally Cardio: no rub GI: soft, non-tender; bowel sounds normal; no masses,  no organomegaly Extremities: edema 1+  Labs: Basic Metabolic Panel:  Recent Labs Lab 05/25/15 0203 05/25/15 1210 05/26/15 0555  NA 124* 125* 123*  K 5.0 4.4 4.1  CL 88* 89* 85*  CO2 '29 29 31  '$ GLUCOSE 61* 119* 81  BUN 38* 35* 32*  CREATININE 2.24* 2.10* 2.04*  ALBUMIN  --   --  2.3*  CALCIUM 8.4* 8.0* 7.9*   Liver Function Tests:  Recent Labs Lab 05/26/15 0555  AST 26  ALT 13*  ALKPHOS 94  BILITOT 1.0  PROT 4.8*  ALBUMIN 2.3*   No results for input(s): LIPASE, AMYLASE in the last 168 hours. No results for input(s): AMMONIA in the last 168 hours. CBC:  Recent Labs Lab 05/25/15 0203 05/26/15 0555  WBC  10.1 7.4  NEUTROABS 8.4*  --   HGB 9.5* 9.0*  HCT 28.2* 26.8*  MCV 89.0 88.7  PLT 290 305   PT/INR: '@labrcntip'$ (inr:5) Cardiac Enzymes: No results for input(s):  CKTOTAL, CKMB, CKMBINDEX, TROPONINI in the last 168 hours. CBG:  Recent Labs Lab 05/25/15 0755 05/25/15 1254 05/25/15 1707 05/25/15 2039 05/26/15 0800  GLUCAP 77 89 249* 263* 120*    Iron Studies: No results for input(s): IRON, TIBC, TRANSFERRIN, FERRITIN in the last 168 hours.  Xrays/Other Studies: Dg Chest 1 View  05/25/2015  CLINICAL DATA:  Weakness, altered mental status EXAM: CHEST 1 VIEW COMPARISON:  CT chest 05/14/2015 FINDINGS: Bilateral small pleural effusions, right greater than left. Right basilar airspace disease similar to the prior exam. There is bilateral interstitial thickening. There is no pneumothorax. The heart and mediastinum are stable. There is thoracic aortic atherosclerosis. There is no acute osseous abnormality. There are old right posterior rib fractures. IMPRESSION: Bilateral pleural effusions and interstitial as well as alveolar airspace opacities concerning for an element of pulmonary edema versus infection. The right lower lobe airspace disease is similar to the prior CT chest dated 05/14/2015. Electronically Signed   By: Kathreen Devoid   On: 05/25/2015 02:16     Assessment/Plan: 1.  AKI- likely related to ischemic ATN in setting of relative hypotension, acute injury, and diuretic therapy, now with acute decompensated diastolic CHF.  Continue to follow closely. 2. Hyponatremia- hypervolemic related to decompensated diastolic CHF.  Uosm was inappropriately high but is likely due to the development of AKI and inability to maximally dilute urine.  Continue with IV lasix and follow daily SNa levels, I's/o's, and daily weights.  No indication for tolvaptan at this time but may require if sodium levels continue to fall.  H/o lung cancer s/p XRT so SIADH less likely. 1. Will increase lasix dose to '80mg'$  IV as she has not really responded to lower dose IV lasix and is also likely cause of ongoing hyponatremia due to persistent volume excess. 3. Acute decompensated  diastolic CHF- related to holding diuretics and AKI. Not responding to low dose IV lasix so will increase dose and follow I's/O's and electrolytes.  Hold aldactone  4. Hyperkalemia- resolved off of aldactone 5. Pelvic fracture- per primary svc 6. Pulmonary HTN 7. ABLA- will need further workup and r/o active bleeding following traumatic pelvic fracture.  Guaiac stools and transfuse prn.   Abimael Zeiter A 05/26/2015, 8:29 AM

## 2015-05-27 LAB — CBC
HCT: 24.6 % — ABNORMAL LOW (ref 36.0–46.0)
HEMOGLOBIN: 8.3 g/dL — AB (ref 12.0–15.0)
MCH: 30.1 pg (ref 26.0–34.0)
MCHC: 33.7 g/dL (ref 30.0–36.0)
MCV: 89.1 fL (ref 78.0–100.0)
Platelets: 301 10*3/uL (ref 150–400)
RBC: 2.76 MIL/uL — ABNORMAL LOW (ref 3.87–5.11)
RDW: 15.5 % (ref 11.5–15.5)
WBC: 8.7 10*3/uL (ref 4.0–10.5)

## 2015-05-27 LAB — PROTEIN ELECTROPHORESIS, SERUM
A/G Ratio: 1 (ref 0.7–1.7)
ALBUMIN ELP: 2.3 g/dL — AB (ref 2.9–4.4)
ALPHA-1-GLOBULIN: 0.3 g/dL (ref 0.0–0.4)
Alpha-2-Globulin: 0.7 g/dL (ref 0.4–1.0)
Beta Globulin: 0.6 g/dL — ABNORMAL LOW (ref 0.7–1.3)
GLOBULIN, TOTAL: 2.2 g/dL (ref 2.2–3.9)
Gamma Globulin: 0.7 g/dL (ref 0.4–1.8)
TOTAL PROTEIN ELP: 4.5 g/dL — AB (ref 6.0–8.5)

## 2015-05-27 LAB — COMPREHENSIVE METABOLIC PANEL
ALBUMIN: 2.6 g/dL — AB (ref 3.5–5.0)
ALK PHOS: 108 U/L (ref 38–126)
ALT: 15 U/L (ref 14–54)
ANION GAP: 11 (ref 5–15)
AST: 27 U/L (ref 15–41)
BUN: 39 mg/dL — ABNORMAL HIGH (ref 6–20)
CHLORIDE: 88 mmol/L — AB (ref 101–111)
CO2: 28 mmol/L (ref 22–32)
CREATININE: 1.76 mg/dL — AB (ref 0.44–1.00)
Calcium: 7.8 mg/dL — ABNORMAL LOW (ref 8.9–10.3)
GFR calc Af Amer: 29 mL/min — ABNORMAL LOW (ref >60)
GFR calc non Af Amer: 25 mL/min — ABNORMAL LOW (ref >60)
GLUCOSE: 95 mg/dL (ref 65–99)
Potassium: 3.5 mmol/L (ref 3.5–5.1)
SODIUM: 127 mmol/L — AB (ref 135–145)
Total Bilirubin: 0.6 mg/dL (ref 0.3–1.2)
Total Protein: 4.8 g/dL — ABNORMAL LOW (ref 6.5–8.1)

## 2015-05-27 LAB — GLUCOSE, CAPILLARY
GLUCOSE-CAPILLARY: 54 mg/dL — AB (ref 65–99)
GLUCOSE-CAPILLARY: 213 mg/dL — AB (ref 65–99)
Glucose-Capillary: 135 mg/dL — ABNORMAL HIGH (ref 65–99)
Glucose-Capillary: 47 mg/dL — ABNORMAL LOW (ref 65–99)
Glucose-Capillary: 65 mg/dL (ref 65–99)
Glucose-Capillary: 79 mg/dL (ref 65–99)
Glucose-Capillary: 100 mg/dL — ABNORMAL HIGH (ref 65–99)
Glucose-Capillary: 122 mg/dL — ABNORMAL HIGH (ref 65–99)
Glucose-Capillary: 130 mg/dL — ABNORMAL HIGH (ref 65–99)
Glucose-Capillary: 93 mg/dL (ref 65–99)

## 2015-05-27 LAB — PROTIME-INR
INR: 1.29 (ref 0.00–1.49)
PROTHROMBIN TIME: 16.2 s — AB (ref 11.6–15.2)

## 2015-05-27 LAB — FOLATE RBC
Folate, Hemolysate: 591.4 ng/mL
Folate, RBC: 2249 ng/mL (ref >498)
Hematocrit: 26.3 % — ABNORMAL LOW (ref 34.0–46.6)

## 2015-05-27 MED ORDER — INSULIN GLARGINE 100 UNIT/ML ~~LOC~~ SOLN
15.0000 [IU] | Freq: Every day | SUBCUTANEOUS | Status: DC
  Filled 2015-05-27 (×4): qty 0.15

## 2015-05-27 MED ORDER — POTASSIUM CHLORIDE CRYS ER 20 MEQ PO TBCR
40.0000 meq | EXTENDED_RELEASE_TABLET | Freq: Once | ORAL | Status: AC
  Administered 2015-05-27: 40 meq via ORAL
  Filled 2015-05-27: qty 2

## 2015-05-27 MED ORDER — FUROSEMIDE 10 MG/ML IJ SOLN
80.0000 mg | Freq: Four times a day (QID) | INTRAMUSCULAR | Status: DC
  Administered 2015-05-27 – 2015-05-28 (×4): 80 mg via INTRAVENOUS
  Filled 2015-05-27 (×4): qty 8

## 2015-05-27 MED ORDER — WARFARIN SODIUM 5 MG PO TABS
5.0000 mg | ORAL_TABLET | Freq: Once | ORAL | Status: AC
  Administered 2015-05-27: 5 mg via ORAL
  Filled 2015-05-27: qty 1

## 2015-05-27 MED ORDER — DEXTROSE 10 % IV SOLN: INTRAVENOUS | Status: DC

## 2015-05-27 NOTE — Progress Notes (Signed)
[facility] PROGRESS NOTE  Wendy Hopkins XHB:[id number] DOB: 1926-04-09 DOA: 05/25/2015 PCP: [name], MD  Length of stay: 2   Assessment/Plan: Principal Problem:   Acute on chronic diastolic (congestive) heart failure ([employer]) Active Problems:   Diabetes mellitus type 2, insulin dependent (HCC)   ANEMIA, SECONDARY TO BLOOD LOSS   Essential hypertension   Atrial fibrillation ([place])   Long term current use of anticoagulant   Severe tricuspid regurgitation   Moderate to severe pulmonary hypertension (HCC)   Fluid overload   AKI (acute kidney injury) ([place])   Pressure ulcer    Brief summary Wendy Hopkins is a 81 y.o. female with a past medical history significant for HTN, DM, atrial fibrillation on warfarin, chronic diastolic HF, and pulmonary hypertension who presents with fluid overload.  The patient was recently admitted from October 3-5 withpubic ramus fracture nonoperatively treated for Dr. [name]. On the day of discharge the patient's creatinine went from her baseline of 1.2 to 2.7 mg/dL. She had hyperkalemia that day which was treated with Kayexalate and holding spironolactone at discharge.  In the nursing home, the patient was continued on Kayexalate and her furosemide was stopped because there was concern that this was contributing to her renal failure. Unfortunately over the past 10 days the patient has been increasingly edematous, with swelling in her feet, belly, and hands. Today she was short of breath and somewhat altered and so she was brought to the ED.  In the ED, the patient was hyponatremic, had persistent renal failure, and a chest x-ray with pulmonary edema and pleural effusions    Assessment and plan  1. Acute on chronic diastolic CHF and pulmonary hypertension:  Secondary to discontinuation of diuretics and hydration with IV fluids. She did have an echocardiogram during her last hospitalization that showed worsening pulmonary  hypertension and severe tricuspid regurgitation. EF of 60-65% -BNP-420 Patient's renal function is improving with slow diuresis, diuresis complicated by hyponatremia  Nephrology has increased Lasix to 80 IV every 6, creatinine is improving Appreciate nephrology input in the management of renal failure and diuresis in the setting of hyponatremia  2. Hypervolemic Hyponatremia:  Sodium was 140 on 10/4, 127 on 10/5, now 127, improved after being started on IV Lasix Urine osmolality 277, serum osmolality 268 Nephrology consultation given worsening hyponatremia with diuresis  3. Acute renal failure: Baseline 1.2, presented with a creatinine of 2.24 Some improvement overnight with IV Lasix, continue diuresis per nephrology  4. HTN:  controlled  5. A. Fib on warfarin:  Stable. Rate controlled. CHADS2Vasc score 4. Continue home warfarin  6. DM: Hypoglycemic last night after being started on Lantus Stable continue sliding scale insulin, reduce dose of Lantus to 15 units  DVT prophylaxsis Lovenox  Code Status:      Code Status Orders        Start     Ordered   05/25/15 0428  Do not attempt resuscitation (DNR)   Continuous    Question Answer Comment  In the event of cardiac or respiratory ARREST Do not call a "code blue"   In the event of cardiac or respiratory ARREST Do not perform Intubation, CPR, defibrillation or ACLS   In the event of cardiac or respiratory ARREST Use medication by any route, position, wound care, and other measures to relive pain and suffering. May use oxygen, suction and manual treatment of airway obstruction as needed for comfort.      05/25/15 0427  Advance Directive Documentation        Most Recent Value   Type of Advance Directive  Healthcare Power of Attorney, Out of facility DNR (pink MOST or yellow form)   Pre-existing out of facility DNR order (yellow form or pink MOST form)  Physician notified to receive inpatient order   "MOST" Form in Place?        Family Communication: family updated about patient's clinical progress Disposition Plan:  Anticipate discharge in 2-3 days if the sodium continues to improve  Consultants:  Nephrology  Procedures:  None  Antibiotics: Anti-infectives    None         HPI/Subjective: Patient nauseous this morning, pain is under control, breathing better than yesterday  Objective: Filed Vitals:   05/26/15 2042 05/27/15 0100 05/27/15 0501 05/27/15 1050  BP: 114/50 128/64 127/53 117/57  Pulse: 93 98 92 89  Temp: 98.4 F (36.9 C)  98.3 F (36.8 C) 97.8 F (36.6 C)  TempSrc: Oral  Oral Oral  Resp: '20 22 18 20  '$ Height:      Weight: 53.6 kg (118 lb 2.7 oz)     SpO2: 99% 99% 100% 100%    Intake/Output Summary (Last 24 hours) at 05/27/15 1108 Last data filed at 05/27/15 0740  Gross per 24 hour  Intake    480 ml  Output    350 ml  Net    130 ml    Exam:  General: No acute respiratory distress Lungs: Clear to auscultation bilaterally without wheezes or crackles Cardiovascular: Regular rate and rhythm without murmur gallop or rub normal S1 and S2, by basilar crackles Abdomen: Nontender, nondistended, soft, bowel sounds positive, no rebound, no ascites, no appreciable mass Extremities: No significant cyanosis, clubbing, or edema bilateral lower extremities     Data Review   Micro Results Recent Results (from the past 240 hour(s))  MRSA PCR Screening     Status: None   Collection Time: 05/25/15  6:26 PM  Result Value Ref Range Status   MRSA by PCR NEGATIVE NEGATIVE Final    Comment:        The GeneXpert MRSA Assay (FDA approved for NASAL specimens only), is one component of a comprehensive MRSA colonization surveillance program. It is not intended to diagnose MRSA infection nor to guide or monitor treatment for MRSA infections.     Radiology Reports Dg Chest 1 View  05/25/2015  CLINICAL DATA:  Weakness, altered mental status EXAM: CHEST 1 VIEW COMPARISON:  CT  chest 05/14/2015 FINDINGS: Bilateral small pleural effusions, right greater than left. Right basilar airspace disease similar to the prior exam. There is bilateral interstitial thickening. There is no pneumothorax. The heart and mediastinum are stable. There is thoracic aortic atherosclerosis. There is no acute osseous abnormality. There are old right posterior rib fractures. IMPRESSION: Bilateral pleural effusions and interstitial as well as alveolar airspace opacities concerning for an element of pulmonary edema versus infection. The right lower lobe airspace disease is similar to the prior CT chest dated 05/14/2015. Electronically Signed   By: Kathreen Devoid   On: 05/25/2015 02:16   Ct Head Wo Contrast  05/14/2015  CLINICAL DATA:  [place].  Anticoagulated. EXAM: CT HEAD WITHOUT CONTRAST CT CERVICAL SPINE WITHOUT CONTRAST TECHNIQUE: Multidetector CT imaging of the head and cervical spine was performed following the standard protocol without intravenous contrast. Multiplanar CT image reconstructions of the cervical spine were also generated. COMPARISON:  None. FINDINGS: CT HEAD FINDINGS There is no intracranial hemorrhage or extra-axial  fluid collection. There is moderately severe generalized atrophy. There is mild white matter hypodensity consistent with chronic small vessel disease. Calvarium and skullbase are intact. CT CERVICAL SPINE FINDINGS The vertebral column, pedicles and facet articulations are intact. There is no evidence of acute fracture. No acute soft tissue abnormalities are evident. Mild arthritic changes are evident in the midcervical spine. Severe atlantoaxial degenerative changes are present. IMPRESSION: 1. Negative for acute intracranial traumatic injury. There is moderately severe generalized atrophy and chronic small vessel disease. 2. Negative for acute cervical spine fracture Electronically Signed   By: Andreas Newport M.D.   On: 05/14/2015 02:40   Ct Chest W Contrast  05/14/2015   CLINICAL DATA:  Lower back and hip pain after falling. EXAM: CT CHEST, ABDOMEN, AND PELVIS WITH CONTRAST TECHNIQUE: Multidetector CT imaging of the chest, abdomen and pelvis was performed following the standard protocol during bolus administration of intravenous contrast. CONTRAST:  39m OMNIPAQUE IOHEXOL 300 MG/ML  SOLN COMPARISON:  01/03/2015, 11/30/2014, 04/24/2014. FINDINGS: CT CHEST FINDINGS Mediastinum/Nodes: The mediastinum and intrathoracic vascular structures are intact. There is no mediastinal hemorrhage. Lungs/Pleura: There is no pneumothorax. There is no effusion. There is unchanged right lower lobe collapse and consolidation. Musculoskeletal: No acute fracture is evident. There is chronic moderately displaced fracture of the sternum. There is severe thoracic kyphosis with chronic severe compressions of T6-T8, compression of T12, and moderate superior endplate impaction at L3. These are unchanged. Prior kyphoplasties again noted in the thoracic spine. Multiple chronic rib fracture deformities are present. CT ABDOMEN PELVIS FINDINGS Hepatobiliary: A few unchanged sub cm hepatic hypodensities are present. There is no hepatic laceration. Pancreas: Unremarkable Spleen: Normal except for a small unchanged hypodense focus anteriorly. Adrenals/Urinary Tract: Adrenals and kidneys are unremarkable. Ureters are unremarkable. Urinary bladder is moderately distended but appears intact. Stomach/Bowel: Bowel appears intact. No peritoneal free air or blood. Vascular/Lymphatic: The abdominal aorta is normal in caliber with extensive atherosclerotic calcification. Reproductive: Unremarkable Musculoskeletal: There is diastases of the left sacroiliac joint. There are fractures of the left iliac bone adjacent to the sacroiliac joint, minimally displaced. There are moderately displaced left superior and inferior pubic rami fractures. The hips and acetabuli are intact. Pubic symphysis is intact. IMPRESSION: 1. Negative for  acute intrathoracic traumatic injury. 2. There are acute pelvic fractures including the left pubic rami, and left iliac bone adjacent to the SI joint. There is diastases of the left SI joint. 3. No significant hematoma associated with these pelvic fractures. 4. No evidence of bowel or intra-abdominal parenchymal organ injury. Electronically Signed   By: DAndreas NewportM.D.   On: 05/14/2015 03:00   Ct Cervical Spine Wo Contrast  05/14/2015  CLINICAL DATA:  [place]  Anticoagulated. EXAM: CT HEAD WITHOUT CONTRAST CT CERVICAL SPINE WITHOUT CONTRAST TECHNIQUE: Multidetector CT imaging of the head and cervical spine was performed following the standard protocol without intravenous contrast. Multiplanar CT image reconstructions of the cervical spine were also generated. COMPARISON:  None. FINDINGS: CT HEAD FINDINGS There is no intracranial hemorrhage or extra-axial fluid collection. There is moderately severe generalized atrophy. There is mild white matter hypodensity consistent with chronic small vessel disease. Calvarium and skullbase are intact. CT CERVICAL SPINE FINDINGS The vertebral column, pedicles and facet articulations are intact. There is no evidence of acute fracture. No acute soft tissue abnormalities are evident. Mild arthritic changes are evident in the midcervical spine. Severe atlantoaxial degenerative changes are present. IMPRESSION: 1. Negative for acute intracranial traumatic injury. There is moderately severe generalized atrophy  and chronic small vessel disease. 2. Negative for acute cervical spine fracture Electronically Signed   By: Andreas Newport M.D.   On: 05/14/2015 02:40   Ct Abdomen Pelvis W Contrast  05/14/2015  CLINICAL DATA:  Lower back and hip pain after falling. EXAM: CT CHEST, ABDOMEN, AND PELVIS WITH CONTRAST TECHNIQUE: Multidetector CT imaging of the chest, abdomen and pelvis was performed following the standard protocol during bolus administration of intravenous contrast.  CONTRAST:  74m OMNIPAQUE IOHEXOL 300 MG/ML  SOLN COMPARISON:  01/03/2015, 11/30/2014, 04/24/2014. FINDINGS: CT CHEST FINDINGS Mediastinum/Nodes: The mediastinum and intrathoracic vascular structures are intact. There is no mediastinal hemorrhage. Lungs/Pleura: There is no pneumothorax. There is no effusion. There is unchanged right lower lobe collapse and consolidation. Musculoskeletal: No acute fracture is evident. There is chronic moderately displaced fracture of the sternum. There is severe thoracic kyphosis with chronic severe compressions of T6-T8, compression of T12, and moderate superior endplate impaction at L3. These are unchanged. Prior kyphoplasties again noted in the thoracic spine. Multiple chronic rib fracture deformities are present. CT ABDOMEN PELVIS FINDINGS Hepatobiliary: A few unchanged sub cm hepatic hypodensities are present. There is no hepatic laceration. Pancreas: Unremarkable Spleen: Normal except for a small unchanged hypodense focus anteriorly. Adrenals/Urinary Tract: Adrenals and kidneys are unremarkable. Ureters are unremarkable. Urinary bladder is moderately distended but appears intact. Stomach/Bowel: Bowel appears intact. No peritoneal free air or blood. Vascular/Lymphatic: The abdominal aorta is normal in caliber with extensive atherosclerotic calcification. Reproductive: Unremarkable Musculoskeletal: There is diastases of the left sacroiliac joint. There are fractures of the left iliac bone adjacent to the sacroiliac joint, minimally displaced. There are moderately displaced left superior and inferior pubic rami fractures. The hips and acetabuli are intact. Pubic symphysis is intact. IMPRESSION: 1. Negative for acute intrathoracic traumatic injury. 2. There are acute pelvic fractures including the left pubic rami, and left iliac bone adjacent to the SI joint. There is diastases of the left SI joint. 3. No significant hematoma associated with these pelvic fractures. 4. No evidence  of bowel or intra-abdominal parenchymal organ injury. Electronically Signed   By: DAndreas NewportM.D.   On: 05/14/2015 03:00   Dg Chest Port 1 View  05/26/2015  CLINICAL DATA:  Short of breath. Also complaining of back pain and pelvic pain. Subsequent encounter. EXAM: PORTABLE CHEST 1 VIEW COMPARISON:  05/25/2015 FINDINGS: Bilateral pleural effusions are without change from the prior study. Interstitial thickening and mild hazy central airspace opacity noted previously has mildly improved. There are no new areas of lung opacity. Cardiac silhouette is mildly enlarged. No pneumothorax. IMPRESSION: 1. Mild improvement in presumed pulmonary edema. There are persistent pleural effusions obscuring the hemidiaphragms. No new lung abnormalities. Electronically Signed   By: DLajean ManesM.D.   On: 05/26/2015 20:29   Dg Hip Unilat With Pelvis 2-3 Views Left  05/13/2015  CLINICAL DATA:  [place] EXAM: DG HIP (WITH OR WITHOUT PELVIS) 2-3V LEFT COMPARISON:  None. FINDINGS: There are fractures of the left superior and inferior pubic rami with displacement. Bones are osteopenic. The left femoral neck is grossly intact. IMPRESSION: Osteopenia Displaced fractures of the left superior and inferior pubic rami. Electronically Signed   By: AMarybelle KillingsM.D.   On: 05/13/2015 23:38     CBC  Recent Labs Lab 05/25/15 0203 05/26/15 0555 05/27/15 0515  WBC 10.1 7.4 8.7  HGB 9.5* 9.0* 8.3*  HCT 28.2* 26.8* 24.6*  PLT 290 305 301  MCV 89.0 88.7 89.1  MCH 30.0 29.8 30.1  MCHC 33.7 33.6 33.7  RDW 14.8 15.4 15.5  LYMPHSABS 0.7  --   --   MONOABS 0.8  --   --   EOSABS 0.1  --   --   BASOSABS 0.0  --   --     Chemistries   Recent Labs Lab 05/25/15 0203 05/25/15 1210 05/26/15 0555 05/27/15 0515  NA 124* 125* 123* 127*  K 5.0 4.4 4.1 3.5  CL 88* 89* 85* 88*  CO2 '29 29 31 28  '$ GLUCOSE 61* 119* 81 95  BUN 38* 35* 32* 39*  CREATININE 2.24* 2.10* 2.04* 1.76*  CALCIUM 8.4* 8.0* 7.9* 7.8*  AST  --   --  26 27   ALT  --   --  13* 15  ALKPHOS  --   --  94 108  BILITOT  --   --  1.0 0.6   ------------------------------------------------------------------------------------------------------------------ estimated creatinine clearance is 17.2 mL/min (by C-G formula based on Cr of 1.76). ------------------------------------------------------------------------------------------------------------------ No results for input(s): HGBA1C in the last 72 hours. ------------------------------------------------------------------------------------------------------------------ No results for input(s): CHOL, HDL, LDLCALC, TRIG, CHOLHDL, LDLDIRECT in the last 72 hours. ------------------------------------------------------------------------------------------------------------------  Recent Labs  05/25/15 1210  TSH 3.939   ------------------------------------------------------------------------------------------------------------------  Recent Labs  05/26/15 1523  VITAMINB12 240  FERRITIN 202  TIBC 238*  IRON 34    Coagulation profile  Recent Labs Lab 05/25/15 0533 05/26/15 0555 05/27/15 0515  INR 1.47 1.41 1.29    No results for input(s): DDIMER in the last 72 hours.  Cardiac Enzymes No results for input(s): CKMB, TROPONINI, MYOGLOBIN in the last 168 hours.  Invalid input(s): CK ------------------------------------------------------------------------------------------------------------------ Invalid input(s): POCBNP   CBG:  Recent Labs Lab 05/27/15 0441 05/27/15 0442 05/27/15 0745 05/27/15 0814 05/27/15 0943  GLUCAP 65 79 54* 100* 122*       Studies: Dg Chest Port 1 View  05/26/2015  CLINICAL DATA:  Short of breath. Also complaining of back pain and pelvic pain. Subsequent encounter. EXAM: PORTABLE CHEST 1 VIEW COMPARISON:  05/25/2015 FINDINGS: Bilateral pleural effusions are without change from the prior study. Interstitial thickening and mild hazy central airspace opacity  noted previously has mildly improved. There are no new areas of lung opacity. Cardiac silhouette is mildly enlarged. No pneumothorax. IMPRESSION: 1. Mild improvement in presumed pulmonary edema. There are persistent pleural effusions obscuring the hemidiaphragms. No new lung abnormalities. Electronically Signed   By: Lajean Manes M.D.   On: 05/26/2015 20:29      Lab Results  Component Value Date   HGBA1C 8.9* 11/07/2014   HGBA1C 8.7* 01/23/2014   HGBA1C 8.0* 11/08/2013   Lab Results  Component Value Date   LDLCALC 75 11/07/2014   CREATININE 1.76* 05/27/2015       Scheduled Meds: . atorvastatin  10 mg Oral Daily  . enoxaparin (LOVENOX) injection  30 mg Subcutaneous Q24H  . furosemide  80 mg Intravenous Q6H  . insulin aspart  0-5 Units Subcutaneous QHS  . insulin aspart  0-9 Units Subcutaneous TID WC  . insulin glargine  15 Units Subcutaneous q1800  . potassium chloride  40 mEq Oral Once  . sodium chloride  3 mL Intravenous Q12H  . Warfarin - Pharmacist Dosing Inpatient   Does not apply q1800   Continuous Infusions: . dextrose      Principal Problem:   Acute on chronic diastolic (congestive) heart failure (HCC) Active Problems:   Diabetes mellitus type 2, insulin dependent (HCC)   ANEMIA, SECONDARY TO BLOOD LOSS   Essential hypertension  Atrial fibrillation ([place])   Long term current use of anticoagulant   Severe tricuspid regurgitation   Moderate to severe pulmonary hypertension (HCC)   Fluid overload   AKI (acute kidney injury) ([place])   Pressure ulcer    Time spent: 45 minutes   [facility] Pager [phone]. If 7PM-7AM, please contact night-coverage at [email or web address], password [facility] 05/27/2015, 11:08 AM  LOS: 2 days

## 2015-05-27 NOTE — Progress Notes (Signed)
ANTICOAGULATION CONSULT NOTE - Follow Up Consult  Pharmacy Consult for coumadin Indication: atrial fibrillation  Allergies  Allergen Reactions  . Codeine Nausea And Vomiting         Patient Measurements: Height: '4\' 11"'$  (149.9 cm) Weight: 118 lb 2.7 oz (53.6 kg) IBW/kg (Calculated) : 43.2  Vital Signs: Temp: 97.8 F (36.6 C) (10/17 1050) Temp Source: Oral (10/17 1050) BP: 117/57 mmHg (10/17 1050) Pulse Rate: 89 (10/17 1050)  Labs:  Recent Labs  05/25/15 0203 05/25/15 0533 05/25/15 1210 05/26/15 0555 05/27/15 0515  HGB 9.5*  --   --  9.0* 8.3*  HCT 28.2*  --   --  26.8* 24.6*  PLT 290  --   --  305 301  LABPROT  --  17.9*  --  17.3* 16.2*  INR  --  1.47  --  1.41 1.29  CREATININE 2.24*  --  2.10* 2.04* 1.76*    Estimated Creatinine Clearance: 17.2 mL/min (by C-G formula based on Cr of 1.76).   Assessment: 81 yo F on warfarin for afib. Coumadin clinic note from 04/17/15 has a reported INR of 1.8- pt was told to take 7.5 mg on 9/7, then 5 mg daily x 2.5 mg on Fridays. However, on admission 10/15 her warfarin dose from SNF was '2mg'$  daily. Admit INR low, so she was started on VTE prophylaxis dosing of Lovenox.  INR remains low this morning at 1.29. Hgb low but stable, and plts both WNL- no bleeding noted. SCr 1.76 with est CrCl ~15-59m/min.  Goal of Therapy:  INR 2-3 Monitor platelets by anticoagulation protocol: Yes   Plan:  - warfarin '5mg'$  po x1 tonight  - Lovenox '30mg'$  subQ q24h until INR therapeutic  - daily INR - follow for s/s bleeding  Akima Slaugh D. Stephen Baruch, PharmD, BCPS Clinical Pharmacist Pager: [phone]/17/2016 12:22 PM

## 2015-05-27 NOTE — Progress Notes (Signed)
Hypoglycemic Event  CBG: Results for ISMAEL, TREPTOW (MRN [id number]) as of 05/27/2015 04:37  Ref. Range 05/27/2015 04:24  Glucose-Capillary Latest Ref Range: 65-99 mg/dL 47 (L)    Treatment: 15 GM carbohydrate snack  Symptoms: Sweaty  Follow-up CBG: Time: CBG Result:Results for AVABELLA, WAILES (MRN [id number]) as of 05/27/2015 04:48  Ref. Range 05/27/2015 04:42  Glucose-Capillary Latest Ref Range: 65-99 mg/dL 79    Possible Reasons for Event: Inadequate meal intake  Comments/MD notified:    Viviano Simas

## 2015-05-27 NOTE — Progress Notes (Signed)
Assessment/Plan: 1. AKI- likely related to ischemic ATN in setting of relative hypotension, acute injury, and diuretic therapy, now with acute decompensated diastolic CHF. Improving 2. Hyponatremia- hypervolemic variety. Improving  1. Cont IV furosemide, increase to Q 6.  May ned foley, but will avoid if continues to improve. 3. Acute decompensated diastolic CHF- 4. Hyperkalemia- resolved off of aldactone 5. Pelvic fracture, Pulmonary HTN, ABLA  - per primary svc   Subjective: Interval History: SOB  Objective: Vital signs in last 24 hours: Temp:  [98.3 F (36.8 C)-99.1 F (37.3 C)] 98.3 F (36.8 C) (10/17 0501) Pulse Rate:  [92-125] 92 (10/17 0501) Resp:  [15-36] 18 (10/17 0501) BP: (114-128)/(50-64) 127/53 mmHg (10/17 0501) SpO2:  [99 %-100 %] 100 % (10/17 0501) Weight:  [53.6 kg (118 lb 2.7 oz)] 53.6 kg (118 lb 2.7 oz) (10/16 2042) Weight change: -7.4 kg (-16 lb 5 oz)  Intake/Output from previous day: 10/16 0701 - 10/17 0700 In: 360 [P.O.:360] Out: 551 [Urine:550; Stool:1] Intake/Output this shift:    General appearance: mild distress Neck: JVD - elevated cm above sternal notch, no adenopathy, no carotid bruit, no JVD, supple, symmetrical, trachea midline and thyroid not enlarged, symmetric, no tenderness/mass/nodules Chest wall: no tenderness, 1-2+ presacral edema Cardio: regular rate and rhythm, S1, S2 normal, no murmur, click, rub or gallop Extremities: edema 1+  Lab Results:  Recent Labs  05/26/15 0555 05/27/15 0515  WBC 7.4 8.7  HGB 9.0* 8.3*  HCT 26.8* 24.6*  PLT 305 301   BMET:  Recent Labs  05/26/15 0555 05/27/15 0515  NA 123* 127*  K 4.1 3.5  CL 85* 88*  CO2 31 28  GLUCOSE 81 95  BUN 32* 39*  CREATININE 2.04* 1.76*  CALCIUM 7.9* 7.8*   No results for input(s): PTH in the last 72 hours. Iron Studies:  Recent Labs  05/26/15 1523  IRON 34  TIBC 238*  FERRITIN 202   Studies/Results: Dg Chest Port 1 View  05/26/2015  CLINICAL DATA:   Short of breath. Also complaining of back pain and pelvic pain. Subsequent encounter. EXAM: PORTABLE CHEST 1 VIEW COMPARISON:  05/25/2015 FINDINGS: Bilateral pleural effusions are without change from the prior study. Interstitial thickening and mild hazy central airspace opacity noted previously has mildly improved. There are no new areas of lung opacity. Cardiac silhouette is mildly enlarged. No pneumothorax. IMPRESSION: 1. Mild improvement in presumed pulmonary edema. There are persistent pleural effusions obscuring the hemidiaphragms. No new lung abnormalities. Electronically Signed   By: Lajean Manes M.D.   On: 05/26/2015 20:29   Scheduled: . atorvastatin  10 mg Oral Daily  . enoxaparin (LOVENOX) injection  30 mg Subcutaneous Q24H  . furosemide  80 mg Intravenous Q8H  . insulin aspart  0-5 Units Subcutaneous QHS  . insulin aspart  0-9 Units Subcutaneous TID WC  . insulin glargine  15 Units Subcutaneous q1800  . sodium chloride  3 mL Intravenous Q12H  . Warfarin - Pharmacist Dosing Inpatient   Does not apply q1800     LOS: 2 days   Irem Stoneham C 05/27/2015,10:48 AM

## 2015-05-28 LAB — BASIC METABOLIC PANEL
Anion gap: 9 (ref 5–15)
BUN: 35 mg/dL — ABNORMAL HIGH (ref 6–20)
CALCIUM: 7.6 mg/dL — AB (ref 8.9–10.3)
CO2: 32 mmol/L (ref 22–32)
CREATININE: 1.63 mg/dL — AB (ref 0.44–1.00)
Chloride: 86 mmol/L — ABNORMAL LOW (ref 101–111)
GFR, EST AFRICAN AMERICAN: 32 mL/min — AB (ref >60)
GFR, EST NON AFRICAN AMERICAN: 27 mL/min — AB (ref >60)
Glucose, Bld: 202 mg/dL — ABNORMAL HIGH (ref 65–99)
Potassium: 4.1 mmol/L (ref 3.5–5.1)
Sodium: 127 mmol/L — ABNORMAL LOW (ref 135–145)

## 2015-05-28 LAB — PROTIME-INR
INR: 1.67 — AB (ref 0.00–1.49)
Prothrombin Time: 19.7 seconds — ABNORMAL HIGH (ref 11.6–15.2)

## 2015-05-28 LAB — CBC
HEMATOCRIT: 24.8 % — AB (ref 36.0–46.0)
HEMOGLOBIN: 8.4 g/dL — AB (ref 12.0–15.0)
MCH: 30.5 pg (ref 26.0–34.0)
MCHC: 33.9 g/dL (ref 30.0–36.0)
MCV: 90.2 fL (ref 78.0–100.0)
Platelets: 296 10*3/uL (ref 150–400)
RBC: 2.75 MIL/uL — ABNORMAL LOW (ref 3.87–5.11)
RDW: 15.9 % — AB (ref 11.5–15.5)
WBC: 8.4 10*3/uL (ref 4.0–10.5)

## 2015-05-28 LAB — UIFE/LIGHT CHAINS/TP QN, 24-HR UR
% BETA, URINE: 22.4 %
ALBUMIN, U: 30.9 %
ALPHA 1 URINE: 4.3 %
Alpha 2, Urine: 15.4 %
FREE KAPPA/LAMBDA RATIO: 4.05 (ref 2.04–10.37)
FREE LAMBDA LT CHAINS, UR: 4.42 mg/L (ref 0.24–6.66)
FREE LT CHN EXCR RATE: 17.9 mg/L (ref 1.35–24.19)
GAMMA GLOBULIN URINE: 27 %
Total Protein, Urine: 10.3 mg/dL

## 2015-05-28 LAB — GLUCOSE, CAPILLARY
GLUCOSE-CAPILLARY: 269 mg/dL — AB (ref 65–99)
Glucose-Capillary: 200 mg/dL — ABNORMAL HIGH (ref 65–99)
Glucose-Capillary: 153 mg/dL — ABNORMAL HIGH (ref 65–99)
Glucose-Capillary: 390 mg/dL — ABNORMAL HIGH (ref 65–99)

## 2015-05-28 MED ORDER — FUROSEMIDE 10 MG/ML IJ SOLN
80.0000 mg | Freq: Three times a day (TID) | INTRAMUSCULAR | Status: DC
  Administered 2015-05-28 – 2015-05-29 (×3): 80 mg via INTRAVENOUS
  Filled 2015-05-28 (×3): qty 8

## 2015-05-28 MED ORDER — WARFARIN SODIUM 5 MG PO TABS
5.0000 mg | ORAL_TABLET | Freq: Once | ORAL | Status: AC
  Administered 2015-05-28: 5 mg via ORAL
  Filled 2015-05-28: qty 1

## 2015-05-28 NOTE — Progress Notes (Signed)
ANTICOAGULATION CONSULT NOTE - Follow Up Consult  Pharmacy Consult for coumadin Indication: atrial fibrillation  Allergies  Allergen Reactions  . Codeine Nausea And Vomiting         Patient Measurements: Height: '4\' 11"'$  (149.9 cm) Weight: 122 lb 9.2 oz (55.6 kg) IBW/kg (Calculated) : 43.2  Vital Signs: Temp: 98 F (36.7 C) (10/18 0525) Temp Source: Oral (10/18 0525) BP: 148/72 mmHg (10/18 0525) Pulse Rate: 90 (10/18 0525)  Labs:  Recent Labs  05/26/15 0555 05/26/15 1523 05/27/15 0515 05/28/15 0344  HGB 9.0*  --  8.3* 8.4*  HCT 26.8* 26.3* 24.6* 24.8*  PLT 305  --  301 296  LABPROT 17.3*  --  16.2* 19.7*  INR 1.41  --  1.29 1.67*  CREATININE 2.04*  --  1.76* 1.63*    Estimated Creatinine Clearance: 18.9 mL/min (by C-G formula based on Cr of 1.63).   Assessment: 81 yo F on warfarin for afib. Coumadin clinic note from 04/17/15 has a reported INR of 1.8- pt was told to take 7.5 mg on 9/7, then 5 mg daily x 2.5 mg on Fridays. However, on admission 10/15 her warfarin dose from SNF was '2mg'$  daily. Admit INR low, so she was started on VTE prophylaxis dosing of Lovenox.  INR increased nicely this morning to 1.63. Hgb low but stable, and plts WNL- no bleeding noted. SCr 1.6 with est CrCl ~15-65m/min.  Goal of Therapy:  INR 2-3 Monitor platelets by anticoagulation protocol: Yes   Plan:  - warfarin '5mg'$  po x1 tonight  - Lovenox '30mg'$  subQ q24h until INR therapeutic  - daily INR - follow for s/s bleeding  Guadalupe Kerekes D. Polette Nofsinger, PharmD, BCPS Clinical Pharmacist Pager: [phone]/18/2016 1:31 PM

## 2015-05-28 NOTE — Progress Notes (Signed)
[facility] PROGRESS NOTE  Wendy Hopkins:[id number] DOB: [birth year] DOA: 05/25/2015 PCP: [name], MD  Length of stay: 3   Assessment/Plan: Principal Problem:   Acute on chronic diastolic (congestive) heart failure ([place]) Active Problems:   Diabetes mellitus type 2, insulin dependent (HCC)   ANEMIA, SECONDARY TO BLOOD LOSS   Essential hypertension   Atrial fibrillation ([place])   Long term current use of anticoagulant   Severe tricuspid regurgitation   Moderate to severe pulmonary hypertension (HCC)   Fluid overload   AKI (acute kidney injury) ([place])   Pressure ulcer    Brief summary Wendy Hopkins is a 81 y.o. female with a past medical history significant for HTN, DM, atrial fibrillation on warfarin, chronic diastolic HF, and pulmonary hypertension who presents with fluid overload.  The patient was recently admitted from October 3-5 withpubic ramus fracture nonoperatively treated for Dr. [name]. On the day of discharge the patient's creatinine went from her baseline of 1.2 to 2.7 mg/dL. She had hyperkalemia that day which was treated with Kayexalate and holding spironolactone at discharge.  In the nursing home, the patient was continued on Kayexalate and her furosemide was stopped because there was concern that this was contributing to her renal failure. Unfortunately over the past 10 days the patient has been increasingly edematous, with swelling in her feet, belly, and hands. Today she was short of breath and somewhat altered and so she was brought to the ED.  In the ED, the patient was hyponatremic, had persistent renal failure, and a chest x-ray with pulmonary edema and pleural effusions    Assessment and plan  1. Acute on chronic diastolic CHF and pulmonary hypertension:  Secondary to discontinuation of diuretics and hydration with IV fluids. She did have an echocardiogram during her last hospitalization that showed worsening pulmonary  hypertension and severe tricuspid regurgitation. EF of 60-65% -BNP-420 Patient's renal function is improving with slow diuresis, diuresis complicated by hyponatremia  Continue Lasix to 80 IV every 6, creatinine is improving Appreciate nephrology input in the management of renal failure and diuresis in the setting of hyponatremia  2. Hypervolemic Hyponatremia:  Sodium was 140 on 10/4, 127 on 10/5, now 127, 2 days, improved after being started on IV Lasix Urine osmolality 277, serum osmolality 268 Nephrology is following  3. Acute renal failure: Baseline 1.2, presented with a creatinine of 2.24, now 1.63 Some improvement overnight with IV Lasix, continue diuresis per nephrology  4. HTN:  controlled  5. A. Fib on warfarin:  Stable. Rate controlled. CHADS2Vasc score 4. Continue home warfarin, INR 1.67  6. DM: Hypoglycemic last night after being started on Lantus Now stable, continue Lantus to 15 units  DVT prophylaxsis Lovenox  Code Status:      Code Status Orders        Start     Ordered   05/25/15 0428  Do not attempt resuscitation (DNR)   Continuous    Question Answer Comment  In the event of cardiac or respiratory ARREST Do not call a "code blue"   In the event of cardiac or respiratory ARREST Do not perform Intubation, CPR, defibrillation or ACLS   In the event of cardiac or respiratory ARREST Use medication by any route, position, wound care, and other measures to relive pain and suffering. May use oxygen, suction and manual treatment of airway obstruction as needed for comfort.      05/25/15 0427    Advance Directive Documentation  Most Recent Value   Type of Advance Directive  Healthcare Power of Attorney, Out of facility DNR (pink MOST or yellow form)   Pre-existing out of facility DNR order (yellow form or pink MOST form)  Physician notified to receive inpatient order   "MOST" Form in Place?       Family Communication: family updated about patient's  clinical progress Disposition Plan:  Anticipate discharge in 2-3 days if the sodium continues to improve  Consultants:  Nephrology  Procedures:  None  Antibiotics: Anti-infectives    None         HPI/Subjective: Very uncomfortable in bed, offered patient Foley catheter and the patient declined  Objective: Filed Vitals:   05/27/15 1556 05/27/15 1806 05/27/15 2040 05/28/15 0525  BP: 127/64 117/61 136/64 148/72  Pulse: 96 92 95 90  Temp:  98.2 F (36.8 C) 97.4 F (36.3 C) 98 F (36.7 C)  TempSrc:  Oral Oral Oral  Resp:  '18 18 18  '$ Height:      Weight:   55.6 kg (122 lb 9.2 oz)   SpO2:  96% 100% 100%    Intake/Output Summary (Last 24 hours) at 05/28/15 1205 Last data filed at 05/28/15 1028  Gross per 24 hour  Intake    720 ml  Output   1200 ml  Net   -480 ml    Exam:  General: No acute respiratory distress Lungs: Clear to auscultation bilaterally without wheezes or crackles Cardiovascular: Regular rate and rhythm without murmur gallop or rub normal S1 and S2, by basilar crackles Abdomen: Nontender, nondistended, soft, bowel sounds positive, no rebound, no ascites, no appreciable mass Extremities: No significant cyanosis, clubbing, or edema bilateral lower extremities     Data Review   Micro Results Recent Results (from the past 240 hour(s))  MRSA PCR Screening     Status: None   Collection Time: 05/25/15  6:26 PM  Result Value Ref Range Status   MRSA by PCR NEGATIVE NEGATIVE Final    Comment:        The GeneXpert MRSA Assay (FDA approved for NASAL specimens only), is one component of a comprehensive MRSA colonization surveillance program. It is not intended to diagnose MRSA infection nor to guide or monitor treatment for MRSA infections.     Radiology Reports Dg Chest 1 View  05/25/2015  CLINICAL DATA:  Weakness, altered mental status EXAM: CHEST 1 VIEW COMPARISON:  CT chest 05/14/2015 FINDINGS: Bilateral small pleural effusions, right  greater than left. Right basilar airspace disease similar to the prior exam. There is bilateral interstitial thickening. There is no pneumothorax. The heart and mediastinum are stable. There is thoracic aortic atherosclerosis. There is no acute osseous abnormality. There are old right posterior rib fractures. IMPRESSION: Bilateral pleural effusions and interstitial as well as alveolar airspace opacities concerning for an element of pulmonary edema versus infection. The right lower lobe airspace disease is similar to the prior CT chest dated 05/14/2015. Electronically Signed   By: Kathreen Devoid   On: 05/25/2015 02:16   Ct Head Wo Contrast  05/14/2015  CLINICAL DATA:  [place].  Anticoagulated. EXAM: CT HEAD WITHOUT CONTRAST CT CERVICAL SPINE WITHOUT CONTRAST TECHNIQUE: Multidetector CT imaging of the head and cervical spine was performed following the standard protocol without intravenous contrast. Multiplanar CT image reconstructions of the cervical spine were also generated. COMPARISON:  None. FINDINGS: CT HEAD FINDINGS There is no intracranial hemorrhage or extra-axial fluid collection. There is moderately severe generalized atrophy. There is mild white  matter hypodensity consistent with chronic small vessel disease. Calvarium and skullbase are intact. CT CERVICAL SPINE FINDINGS The vertebral column, pedicles and facet articulations are intact. There is no evidence of acute fracture. No acute soft tissue abnormalities are evident. Mild arthritic changes are evident in the midcervical spine. Severe atlantoaxial degenerative changes are present. IMPRESSION: 1. Negative for acute intracranial traumatic injury. There is moderately severe generalized atrophy and chronic small vessel disease. 2. Negative for acute cervical spine fracture Electronically Signed   By: Andreas Newport M.D.   On: 05/14/2015 02:40   Ct Chest W Contrast  05/14/2015  CLINICAL DATA:  Lower back and hip pain after falling. EXAM: CT CHEST,  ABDOMEN, AND PELVIS WITH CONTRAST TECHNIQUE: Multidetector CT imaging of the chest, abdomen and pelvis was performed following the standard protocol during bolus administration of intravenous contrast. CONTRAST:  10m OMNIPAQUE IOHEXOL 300 MG/ML  SOLN COMPARISON:  01/03/2015, 11/30/2014, 04/24/2014. FINDINGS: CT CHEST FINDINGS Mediastinum/Nodes: The mediastinum and intrathoracic vascular structures are intact. There is no mediastinal hemorrhage. Lungs/Pleura: There is no pneumothorax. There is no effusion. There is unchanged right lower lobe collapse and consolidation. Musculoskeletal: No acute fracture is evident. There is chronic moderately displaced fracture of the sternum. There is severe thoracic kyphosis with chronic severe compressions of T6-T8, compression of T12, and moderate superior endplate impaction at L3. These are unchanged. Prior kyphoplasties again noted in the thoracic spine. Multiple chronic rib fracture deformities are present. CT ABDOMEN PELVIS FINDINGS Hepatobiliary: A few unchanged sub cm hepatic hypodensities are present. There is no hepatic laceration. Pancreas: Unremarkable Spleen: Normal except for a small unchanged hypodense focus anteriorly. Adrenals/Urinary Tract: Adrenals and kidneys are unremarkable. Ureters are unremarkable. Urinary bladder is moderately distended but appears intact. Stomach/Bowel: Bowel appears intact. No peritoneal free air or blood. Vascular/Lymphatic: The abdominal aorta is normal in caliber with extensive atherosclerotic calcification. Reproductive: Unremarkable Musculoskeletal: There is diastases of the left sacroiliac joint. There are fractures of the left iliac bone adjacent to the sacroiliac joint, minimally displaced. There are moderately displaced left superior and inferior pubic rami fractures. The hips and acetabuli are intact. Pubic symphysis is intact. IMPRESSION: 1. Negative for acute intrathoracic traumatic injury. 2. There are acute pelvic fractures  including the left pubic rami, and left iliac bone adjacent to the SI joint. There is diastases of the left SI joint. 3. No significant hematoma associated with these pelvic fractures. 4. No evidence of bowel or intra-abdominal parenchymal organ injury. Electronically Signed   By: DAndreas NewportM.D.   On: 05/14/2015 03:00   Ct Cervical Spine Wo Contrast  05/14/2015  CLINICAL DATA:  [place]  Anticoagulated. EXAM: CT HEAD WITHOUT CONTRAST CT CERVICAL SPINE WITHOUT CONTRAST TECHNIQUE: Multidetector CT imaging of the head and cervical spine was performed following the standard protocol without intravenous contrast. Multiplanar CT image reconstructions of the cervical spine were also generated. COMPARISON:  None. FINDINGS: CT HEAD FINDINGS There is no intracranial hemorrhage or extra-axial fluid collection. There is moderately severe generalized atrophy. There is mild white matter hypodensity consistent with chronic small vessel disease. Calvarium and skullbase are intact. CT CERVICAL SPINE FINDINGS The vertebral column, pedicles and facet articulations are intact. There is no evidence of acute fracture. No acute soft tissue abnormalities are evident. Mild arthritic changes are evident in the midcervical spine. Severe atlantoaxial degenerative changes are present. IMPRESSION: 1. Negative for acute intracranial traumatic injury. There is moderately severe generalized atrophy and chronic small vessel disease. 2. Negative for acute cervical spine fracture  Electronically Signed   By: Andreas Newport M.D.   On: 05/14/2015 02:40   Ct Abdomen Pelvis W Contrast  05/14/2015  CLINICAL DATA:  Lower back and hip pain after falling. EXAM: CT CHEST, ABDOMEN, AND PELVIS WITH CONTRAST TECHNIQUE: Multidetector CT imaging of the chest, abdomen and pelvis was performed following the standard protocol during bolus administration of intravenous contrast. CONTRAST:  75m OMNIPAQUE IOHEXOL 300 MG/ML  SOLN COMPARISON:  01/03/2015,  11/30/2014, 04/24/2014. FINDINGS: CT CHEST FINDINGS Mediastinum/Nodes: The mediastinum and intrathoracic vascular structures are intact. There is no mediastinal hemorrhage. Lungs/Pleura: There is no pneumothorax. There is no effusion. There is unchanged right lower lobe collapse and consolidation. Musculoskeletal: No acute fracture is evident. There is chronic moderately displaced fracture of the sternum. There is severe thoracic kyphosis with chronic severe compressions of T6-T8, compression of T12, and moderate superior endplate impaction at L3. These are unchanged. Prior kyphoplasties again noted in the thoracic spine. Multiple chronic rib fracture deformities are present. CT ABDOMEN PELVIS FINDINGS Hepatobiliary: A few unchanged sub cm hepatic hypodensities are present. There is no hepatic laceration. Pancreas: Unremarkable Spleen: Normal except for a small unchanged hypodense focus anteriorly. Adrenals/Urinary Tract: Adrenals and kidneys are unremarkable. Ureters are unremarkable. Urinary bladder is moderately distended but appears intact. Stomach/Bowel: Bowel appears intact. No peritoneal free air or blood. Vascular/Lymphatic: The abdominal aorta is normal in caliber with extensive atherosclerotic calcification. Reproductive: Unremarkable Musculoskeletal: There is diastases of the left sacroiliac joint. There are fractures of the left iliac bone adjacent to the sacroiliac joint, minimally displaced. There are moderately displaced left superior and inferior pubic rami fractures. The hips and acetabuli are intact. Pubic symphysis is intact. IMPRESSION: 1. Negative for acute intrathoracic traumatic injury. 2. There are acute pelvic fractures including the left pubic rami, and left iliac bone adjacent to the SI joint. There is diastases of the left SI joint. 3. No significant hematoma associated with these pelvic fractures. 4. No evidence of bowel or intra-abdominal parenchymal organ injury. Electronically Signed    By: DAndreas NewportM.D.   On: 05/14/2015 03:00   Dg Chest Port 1 View  05/26/2015  CLINICAL DATA:  Short of breath. Also complaining of back pain and pelvic pain. Subsequent encounter. EXAM: PORTABLE CHEST 1 VIEW COMPARISON:  05/25/2015 FINDINGS: Bilateral pleural effusions are without change from the prior study. Interstitial thickening and mild hazy central airspace opacity noted previously has mildly improved. There are no new areas of lung opacity. Cardiac silhouette is mildly enlarged. No pneumothorax. IMPRESSION: 1. Mild improvement in presumed pulmonary edema. There are persistent pleural effusions obscuring the hemidiaphragms. No new lung abnormalities. Electronically Signed   By: DLajean ManesM.D.   On: 05/26/2015 20:29   Dg Hip Unilat With Pelvis 2-3 Views Left  05/13/2015  CLINICAL DATA:  [place] EXAM: DG HIP (WITH OR WITHOUT PELVIS) 2-3V LEFT COMPARISON:  None. FINDINGS: There are fractures of the left superior and inferior pubic rami with displacement. Bones are osteopenic. The left femoral neck is grossly intact. IMPRESSION: Osteopenia Displaced fractures of the left superior and inferior pubic rami. Electronically Signed   By: AMarybelle KillingsM.D.   On: 05/13/2015 23:38     CBC  Recent Labs Lab 05/25/15 0203 05/26/15 0555 05/26/15 1523 05/27/15 0515 05/28/15 0344  WBC 10.1 7.4  --  8.7 8.4  HGB 9.5* 9.0*  --  8.3* 8.4*  HCT 28.2* 26.8* 26.3* 24.6* 24.8*  PLT 290 305  --  301 296  MCV 89.0 88.7  --  89.1 90.2  MCH 30.0 29.8  --  30.1 30.5  MCHC 33.7 33.6  --  33.7 33.9  RDW 14.8 15.4  --  15.5 15.9*  LYMPHSABS 0.7  --   --   --   --   MONOABS 0.8  --   --   --   --   EOSABS 0.1  --   --   --   --   BASOSABS 0.0  --   --   --   --     Chemistries   Recent Labs Lab 05/25/15 0203 05/25/15 1210 05/26/15 0555 05/27/15 0515 05/28/15 0344  NA 124* 125* 123* 127* 127*  K 5.0 4.4 4.1 3.5 4.1  CL 88* 89* 85* 88* 86*  CO2 '29 29 31 28 '$ 32  GLUCOSE 61* 119* 81 95 202*   BUN 38* 35* 32* 39* 35*  CREATININE 2.24* 2.10* 2.04* 1.76* 1.63*  CALCIUM 8.4* 8.0* 7.9* 7.8* 7.6*  AST  --   --  26 27  --   ALT  --   --  13* 15  --   ALKPHOS  --   --  94 108  --   BILITOT  --   --  1.0 0.6  --    ------------------------------------------------------------------------------------------------------------------ estimated creatinine clearance is 18.9 mL/min (by C-G formula based on Cr of 1.63). ------------------------------------------------------------------------------------------------------------------ No results for input(s): HGBA1C in the last 72 hours. ------------------------------------------------------------------------------------------------------------------ No results for input(s): CHOL, HDL, LDLCALC, TRIG, CHOLHDL, LDLDIRECT in the last 72 hours. ------------------------------------------------------------------------------------------------------------------  Recent Labs  05/25/15 1210  TSH 3.939   ------------------------------------------------------------------------------------------------------------------  Recent Labs  05/26/15 1523  VITAMINB12 240  FERRITIN 202  TIBC 238*  IRON 34    Coagulation profile  Recent Labs Lab 05/25/15 0533 05/26/15 0555 05/27/15 0515 05/28/15 0344  INR 1.47 1.41 1.29 1.67*    No results for input(s): DDIMER in the last 72 hours.  Cardiac Enzymes No results for input(s): CKMB, TROPONINI, MYOGLOBIN in the last 168 hours.  Invalid input(s): CK ------------------------------------------------------------------------------------------------------------------ Invalid input(s): POCBNP   CBG:  Recent Labs Lab 05/27/15 0943 05/27/15 1134 05/27/15 1650 05/27/15 2056 05/28/15 0740  GLUCAP 122* 130* 93 213* 200*       Studies: Dg Chest Port 1 View  05/26/2015  CLINICAL DATA:  Short of breath. Also complaining of back pain and pelvic pain. Subsequent encounter. EXAM: PORTABLE CHEST 1  VIEW COMPARISON:  05/25/2015 FINDINGS: Bilateral pleural effusions are without change from the prior study. Interstitial thickening and mild hazy central airspace opacity noted previously has mildly improved. There are no new areas of lung opacity. Cardiac silhouette is mildly enlarged. No pneumothorax. IMPRESSION: 1. Mild improvement in presumed pulmonary edema. There are persistent pleural effusions obscuring the hemidiaphragms. No new lung abnormalities. Electronically Signed   By: Lajean Manes M.D.   On: 05/26/2015 20:29      Lab Results  Component Value Date   HGBA1C 8.9* 11/07/2014   HGBA1C 8.7* 01/23/2014   HGBA1C 8.0* 11/08/2013   Lab Results  Component Value Date   LDLCALC 75 11/07/2014   CREATININE 1.63* 05/28/2015       Scheduled Meds: . atorvastatin  10 mg Oral Daily  . enoxaparin (LOVENOX) injection  30 mg Subcutaneous Q24H  . furosemide  80 mg Intravenous Q8H  . insulin aspart  0-5 Units Subcutaneous QHS  . insulin aspart  0-9 Units Subcutaneous TID WC  . insulin glargine  15 Units Subcutaneous q1800  . sodium chloride  3 mL  Intravenous Q12H  . Warfarin - Pharmacist Dosing Inpatient   Does not apply q1800   Continuous Infusions:    Principal Problem:   Acute on chronic diastolic (congestive) heart failure (HCC) Active Problems:   Diabetes mellitus type 2, insulin dependent (HCC)   ANEMIA, SECONDARY TO BLOOD LOSS   Essential hypertension   Atrial fibrillation (HCC)   Long term current use of anticoagulant   Severe tricuspid regurgitation   Moderate to severe pulmonary hypertension (HCC)   Fluid overload   AKI (acute kidney injury) ([place])   Pressure ulcer    Time spent: 45 minutes   [facility] Pager [phone]. If 7PM-7AM, please contact night-coverage at [email or web address], password [facility] 05/28/2015, 12:05 PM  LOS: 3 days

## 2015-05-28 NOTE — Care Management Important Message (Signed)
Important Message  Patient Details  Name: Wendy Hopkins MRN: [id number] Date of Birth: [birth year]   Medicare Important Message Given:  Yes-second notification given    Delorse Lek 05/28/2015, 3:07 PM

## 2015-05-28 NOTE — Progress Notes (Signed)
Physical Therapy Treatment Patient Details Name: Wendy Hopkins MRN: [id number] DOB: 1926-07-11 Today's Date: 05/28/2015    History of Present Illness Pt is an 81 y/o female with a PMH significant for HTN, DM, a-fib, HF, pulmonary HTN who presents with fluid overload. Pt with a recent admission October 3-5 with a pubic ramus fracture treated non-operatively.     PT Comments    Pt con't to present with generalized weakness. Pt con't report "I just don't feel well" Pt unable to maintain L LE TDWB which is limiting ambulation at this time. Remains appropriate for return to SNF.  Follow Up Recommendations  SNF;Supervision/Assistance - 24 hour     Equipment Recommendations  None recommended by PT    Recommendations for Other Services       Precautions / Restrictions Precautions Precautions: Fall Restrictions Weight Bearing Restrictions: Yes LLE Weight Bearing: Touchdown weight bearing Other Position/Activity Restrictions: per Dr. Debroah Loop note from last admission    Mobility  Bed Mobility               General bed mobility comments: pt sitting up in chair  Transfers Overall transfer level: Needs assistance   Transfers: Sit to/from Stand Sit to Stand: Max assist Stand pivot transfers: Max assist       General transfer comment: max v/c's to maintain L LE TDWB however pt unable to adhere. pt unable to stand up and hold L LE up off floor. Pt unable to pvt on R LE and was placing weight through L LE during transfer to [facility].   Ambulation/Gait             General Gait Details: unable due to inability to hop on R LE and comply with L LE TDWB.   Stairs            Wheelchair Mobility    Modified Rankin (Stroke Patients Only)       Balance Overall balance assessment: Needs assistance Sitting-balance support: Feet supported;Bilateral upper extremity supported Sitting balance-Leahy Scale: Fair     Standing balance support: Bilateral upper extremity  supported Standing balance-Leahy Scale: Zero                      Cognition Arousal/Alertness: Awake/alert Behavior During Therapy: WFL for tasks assessed/performed Overall Cognitive Status: Within Functional Limits for tasks assessed                      Exercises General Exercises - Lower Extremity Ankle Circles/Pumps: AAROM;Both;15 reps;Seated Gluteal Sets: AROM;Both;5 reps Long Arc Quad: AROM;Both;10 reps;Seated Hip Flexion/Marching: PROM;Both;10 reps;Seated    General Comments        Pertinent Vitals/Pain Pain Assessment: Faces Faces Pain Scale: Hurts even more Pain Location: L LE Pain Descriptors / Indicators: Aching Pain Intervention(s): Limited activity within patient's tolerance    Home Living                      Prior Function            PT Goals (current goals can now be found in the care plan section)      Frequency  Min 2X/week    PT Plan Current plan remains appropriate    Co-evaluation             End of Session Equipment Utilized During Treatment: Gait belt Activity Tolerance: Patient limited by fatigue;Patient limited by pain Patient left: in chair;with call bell/phone within reach;with family/visitor present  Time: 3007-6226 PT Time Calculation (min) (ACUTE ONLY): 28 min  Charges:  $Gait Training: 8-22 mins $Therapeutic Exercise: 8-22 mins                    G Codes:      Kingsley Callander 05/28/2015, 3:25 PM  Kittie Plater, PT, DPT Pager #: [phone] Office #: [phone]

## 2015-05-28 NOTE — Progress Notes (Signed)
Expand All Collapse All   Assessment/Plan: 1. AKI- likely related to ischemic ATN in setting of relative hypotension, acute injury, and diuretic therapy, now with acute decompensated diastolic CHF. Slowly improving 2. Hyponatremia- hypervolemic variety--stuck  1. Cont IV furosemide . 3. Acute decompensated diastolic CHF- 4. Hyperkalemia- resolved off of aldactone 5. Pelvic fracture, Pulmonary HTN, ABLA - per primary svc     Subjective: Interval History: "when can I go back to [facility]"  Objective: Vital signs in last 24 hours: Temp:  [97.4 F (36.3 C)-98.2 F (36.8 C)] 98 F (36.7 C) (10/18 0525) Pulse Rate:  [89-96] 90 (10/18 0525) Resp:  [18-20] 18 (10/18 0525) BP: (117-148)/(57-72) 148/72 mmHg (10/18 0525) SpO2:  [96 %-100 %] 100 % (10/18 0525) Weight:  [55.6 kg (122 lb 9.2 oz)] 55.6 kg (122 lb 9.2 oz) (10/17 2040) Weight change: 2 kg (4 lb 6.6 oz)  Intake/Output from previous day: 10/17 0701 - 10/18 0700 In: 360 [P.O.:360] Out: 775 [Urine:775] Intake/Output this shift: Total I/O In: 240 [P.O.:240] Out: 225 [Urine:225]  General appearance: alert, cooperative and more comfortable apearing Chest wall: no tenderness Cardio: regular rate and rhythm, S1, S2 normal, no murmur, click, rub or gallop Extremities: edema 2+ apparently less edema  Lab Results:  Recent Labs  05/27/15 0515 05/28/15 0344  WBC 8.7 8.4  HGB 8.3* 8.4*  HCT 24.6* 24.8*  PLT 301 296   BMET:  Recent Labs  05/27/15 0515 05/28/15 0344  NA 127* 127*  K 3.5 4.1  CL 88* 86*  CO2 28 32  GLUCOSE 95 202*  BUN 39* 35*  CREATININE 1.76* 1.63*  CALCIUM 7.8* 7.6*   No results for input(s): PTH in the last 72 hours. Iron Studies:  Recent Labs  05/26/15 1523  IRON 34  TIBC 238*  FERRITIN 202   Studies/Results: Dg Chest Port 1 View  05/26/2015  CLINICAL DATA:  Short of breath. Also complaining of back pain and pelvic pain. Subsequent encounter. EXAM: PORTABLE CHEST 1 VIEW COMPARISON:   05/25/2015 FINDINGS: Bilateral pleural effusions are without change from the prior study. Interstitial thickening and mild hazy central airspace opacity noted previously has mildly improved. There are no new areas of lung opacity. Cardiac silhouette is mildly enlarged. No pneumothorax. IMPRESSION: 1. Mild improvement in presumed pulmonary edema. There are persistent pleural effusions obscuring the hemidiaphragms. No new lung abnormalities. Electronically Signed   By: Lajean Manes M.D.   On: 05/26/2015 20:29    Scheduled: . atorvastatin  10 mg Oral Daily  . enoxaparin (LOVENOX) injection  30 mg Subcutaneous Q24H  . furosemide  80 mg Intravenous Q6H  . insulin aspart  0-5 Units Subcutaneous QHS  . insulin aspart  0-9 Units Subcutaneous TID WC  . insulin glargine  15 Units Subcutaneous q1800  . sodium chloride  3 mL Intravenous Q12H  . Warfarin - Pharmacist Dosing Inpatient   Does not apply q1800    LOS: 3 days   Massiel Stipp C 05/28/2015,10:16 AM

## 2015-05-29 LAB — BASIC METABOLIC PANEL
ANION GAP: 8 (ref 5–15)
BUN: 36 mg/dL — ABNORMAL HIGH (ref 6–20)
CALCIUM: 7.9 mg/dL — AB (ref 8.9–10.3)
CHLORIDE: 85 mmol/L — AB (ref 101–111)
CO2: 39 mmol/L — AB (ref 22–32)
Creatinine, Ser: 1.54 mg/dL — ABNORMAL HIGH (ref 0.44–1.00)
GFR calc non Af Amer: 29 mL/min — ABNORMAL LOW (ref >60)
GFR, EST AFRICAN AMERICAN: 34 mL/min — AB (ref >60)
GLUCOSE: 148 mg/dL — AB (ref 65–99)
Potassium: 3.6 mmol/L (ref 3.5–5.1)
Sodium: 132 mmol/L — ABNORMAL LOW (ref 135–145)

## 2015-05-29 LAB — PROTIME-INR
INR: 1.82 — ABNORMAL HIGH (ref 0.00–1.49)
PROTHROMBIN TIME: 21 s — AB (ref 11.6–15.2)

## 2015-05-29 LAB — GLUCOSE, CAPILLARY
GLUCOSE-CAPILLARY: 299 mg/dL — AB (ref 65–99)
Glucose-Capillary: 228 mg/dL — ABNORMAL HIGH (ref 65–99)
Glucose-Capillary: 186 mg/dL — ABNORMAL HIGH (ref 65–99)
Glucose-Capillary: 299 mg/dL — ABNORMAL HIGH (ref 65–99)

## 2015-05-29 MED ORDER — WARFARIN SODIUM 5 MG PO TABS
5.0000 mg | ORAL_TABLET | Freq: Once | ORAL | Status: AC
  Administered 2015-05-29: 5 mg via ORAL
  Filled 2015-05-29: qty 1

## 2015-05-29 MED ORDER — POTASSIUM CHLORIDE CRYS ER 20 MEQ PO TBCR
40.0000 meq | EXTENDED_RELEASE_TABLET | Freq: Once | ORAL | Status: AC
  Administered 2015-05-29: 40 meq via ORAL
  Filled 2015-05-29: qty 2

## 2015-05-29 MED ORDER — STERILE WATER FOR INJECTION IJ SOLN: 250.0000 mg | Freq: Two times a day (BID) | INTRAMUSCULAR | Status: DC

## 2015-05-29 MED ORDER — ACETAZOLAMIDE SODIUM 500 MG IJ SOLR
250.0000 mg | Freq: Two times a day (BID) | INTRAMUSCULAR | Status: AC
  Administered 2015-05-29 (×2): 250 mg via INTRAVENOUS
  Filled 2015-05-29 (×3): qty 500

## 2015-05-29 MED ORDER — FUROSEMIDE 10 MG/ML IJ SOLN
60.0000 mg | Freq: Two times a day (BID) | INTRAMUSCULAR | Status: DC
  Administered 2015-05-29: 60 mg via INTRAVENOUS
  Filled 2015-05-29: qty 6

## 2015-05-29 MED ORDER — FUROSEMIDE 10 MG/ML IJ SOLN: 80.0000 mg | Freq: Two times a day (BID) | INTRAMUSCULAR | Status: DC

## 2015-05-29 NOTE — Progress Notes (Signed)
Patient's son refused patient to receive 15 units of Lantus (the scheduled 1800 dose).  He stated it will "bottom her out". He stated she takes 5 units in the morning.  Dr. Allyson Sabal notified.    Per Dr. Allyson Sabal, I re educated patient son on current cbg and need for scheduled lantus, he still refused.  I encouraged him to be here at 8 am to speak with MD.

## 2015-05-29 NOTE — Progress Notes (Signed)
ANTICOAGULATION CONSULT NOTE - Follow Up Consult  Pharmacy Consult for coumadin Indication: atrial fibrillation  Allergies  Allergen Reactions  . Codeine Nausea And Vomiting         Patient Measurements: Height: '4\' 11"'$  (149.9 cm) Weight: 120 lb 9.5 oz (54.7 kg) IBW/kg (Calculated) : 43.2  Vital Signs: Temp: 97.6 F (36.4 C) (10/19 0500) Temp Source: Oral (10/19 0500) BP: 127/72 mmHg (10/19 0500) Pulse Rate: 94 (10/19 0500)  Labs:  Recent Labs  05/26/15 1523 05/27/15 0515 05/28/15 0344 05/29/15 0346  HGB  --  8.3* 8.4*  --   HCT 26.3* 24.6* 24.8*  --   PLT  --  301 296  --   LABPROT  --  16.2* 19.7* 21.0*  INR  --  1.29 1.67* 1.82*  CREATININE  --  1.76* 1.63* 1.54*    Estimated Creatinine Clearance: 19.8 mL/min (by C-G formula based on Cr of 1.54).   Assessment: 81 yo F on warfarin for afib. Coumadin clinic note from 04/17/15 has a reported INR of 1.8- pt was told to take 7.5 mg on 9/7, then 5 mg daily x 2.5 mg on Fridays. However, on admission on 10/15 her warfarin dose from SNF was '2mg'$  daily.  INR increased nicely this morning to 1.82. Hgb low but stable, and plts WNL- no bleeding noted. She continues on prophylactic Lovenox until INR therapeutic.  SCr 1.54 with est CrCl ~15-69m/min.  Goal of Therapy:  INR 2-3 Monitor platelets by anticoagulation protocol: Yes   Plan:  - warfarin '5mg'$  po x1 tonight  - Lovenox '30mg'$  subQ q24h until INR therapeutic  - daily INR - follow for s/s bleeding  Riddhi Grether D. Lankford Gutzmer, PharmD, BCPS Clinical Pharmacist Pager: [phone]/19/2016 11:03 AM

## 2015-05-29 NOTE — Progress Notes (Signed)
[facility] PROGRESS NOTE  Wendy Hopkins ASN:[id number] DOB: 1925/12/06 DOA: 05/25/2015 PCP: Wendy Grant, MD  Length of stay: 4   Assessment/Plan: Principal Problem:   Acute on chronic diastolic (congestive) heart failure (Wendy Hopkins) Active Problems:   Diabetes mellitus type 2, insulin dependent (HCC)   ANEMIA, SECONDARY TO BLOOD LOSS   Essential hypertension   Atrial fibrillation (Wendy Hopkins)   Long term current use of anticoagulant   Severe tricuspid regurgitation   Moderate to severe pulmonary hypertension (HCC)   Fluid overload   AKI (acute kidney injury) (Wendy Hopkins)   Pressure ulcer    Brief summary ECHO PROPP is a 81 y.o. female with a past medical history significant for HTN, DM, atrial fibrillation on warfarin, chronic diastolic HF, and pulmonary hypertension who presents with fluid overload.  The patient was recently admitted from October 3-5 withpubic ramus fracture nonoperatively treated for Dr. Percell Hopkins. On the day of discharge the patient's creatinine went from her baseline of 1.2 to 2.7 mg/dL. She had hyperkalemia that day which was treated with Kayexalate and holding spironolactone at discharge.  In the nursing home, the patient was continued on Kayexalate and her furosemide was stopped because there was concern that this was contributing to her renal failure. Unfortunately over the past 10 days the patient has been increasingly edematous, with swelling in her feet, belly, and hands. Today she was short of breath and somewhat altered and so she was brought to the ED.  In the ED, the patient was hyponatremic, had persistent renal failure, and a chest x-ray with pulmonary edema and pleural effusions    Assessment and plan  1. Acute on chronic diastolic CHF and pulmonary hypertension:  Secondary to discontinuation of diuretics and hydration with IV fluids. She did have an echocardiogram during her last hospitalization that showed worsening pulmonary  hypertension and severe tricuspid regurgitation. EF of 60-65% -BNP-420 Patient's renal function is improving with slow diuresis, diuresis complicated by hyponatremia  Reduce  Lasix to 80 IV every 12, creatinine is improving. Renal added acetazolamide  Appreciate nephrology input in the management of renal failure and diuresis in the setting of hyponatremia  2. Hypervolemic Hyponatremia:  Sodium was 140 on 10/4, 127 on 10/5, now 132 improved after being started on IV Lasix Urine osmolality 277, serum osmolality 268 Nephrology is following  3. Acute renal failure: Baseline 1.2, presented with a creatinine of 2.24, now 1.54 Some improvement overnight with IV Lasix, continue diuresis per nephrology  4. HTN:  controlled  5. A. Fib on warfarin:  Stable. Rate controlled. CHADS2Vasc score 4. Continue home warfarin, INR 1.82  6. DM: continue Lantus to 15 units  DVT prophylaxsis Lovenox  Code Status:      Code Status Orders        Start     Ordered   05/25/15 0428  Do not attempt resuscitation (DNR)   Continuous    Question Answer Comment  In the event of cardiac or respiratory ARREST Do not call a "code blue"   In the event of cardiac or respiratory ARREST Do not perform Intubation, CPR, defibrillation or ACLS   In the event of cardiac or respiratory ARREST Use medication by any route, position, wound care, and other measures to relive pain and suffering. May use oxygen, suction and manual treatment of airway obstruction as needed for comfort.      05/25/15 0427    Advance Directive Documentation        Most  Recent Value   Type of Advance Directive  Healthcare Power of Attorney, Out of facility DNR (pink MOST or yellow form)   Pre-existing out of facility DNR order (yellow form or pink MOST form)  Physician notified to receive inpatient order   "MOST" Form in Place?       Family Communication: family updated about patient's clinical progress Disposition Plan:  Anticipate  discharge in 2-3 days if the sodium continues to improve  Consultants:  Nephrology  Procedures:  None  Antibiotics: Anti-infectives    None         HPI/Subjective: Sitting comfortably in a chair   Objective: Filed Vitals:   05/28/15 1600 05/28/15 1849 05/28/15 2032 05/29/15 0500  BP: 146/68 115/53 106/52 127/72  Pulse:  99 94 94  Temp:  98.1 F (36.7 C) 98.1 F (36.7 C) 97.6 F (36.4 C)  TempSrc:  Oral Oral Oral  Resp:  '18 16 18  '$ Height:      Weight:   54.7 kg (120 lb 9.5 oz)   SpO2:  100% 100% 100%    Intake/Output Summary (Last 24 hours) at 05/29/15 1526 Last data filed at 05/29/15 1504  Gross per 24 hour  Intake    600 ml  Output   1300 ml  Net   -700 ml    Exam:  General: No acute respiratory distress Lungs: Clear to auscultation bilaterally without wheezes or crackles Cardiovascular: Regular rate and rhythm without murmur gallop or rub normal S1 and S2, by basilar crackles Abdomen: Nontender, nondistended, soft, bowel sounds positive, no rebound, no ascites, no appreciable mass Extremities: No significant cyanosis, clubbing, or edema bilateral lower extremities     Data Review   Micro Results Recent Results (from the past 240 hour(s))  MRSA PCR Screening     Status: None   Collection Time: 05/25/15  6:26 PM  Result Value Ref Range Status   MRSA by PCR NEGATIVE NEGATIVE Final    Comment:        The GeneXpert MRSA Assay (FDA approved for NASAL specimens only), is one component of a comprehensive MRSA colonization surveillance program. It is not intended to diagnose MRSA infection nor to guide or monitor treatment for MRSA infections.     Radiology Reports Dg Chest 1 View  05/25/2015  CLINICAL DATA:  Weakness, altered mental status EXAM: CHEST 1 VIEW COMPARISON:  CT chest 05/14/2015 FINDINGS: Bilateral small pleural effusions, right greater than left. Right basilar airspace disease similar to the prior exam. There is bilateral  interstitial thickening. There is no pneumothorax. The heart and mediastinum are stable. There is thoracic aortic atherosclerosis. There is no acute osseous abnormality. There are old right posterior rib fractures. IMPRESSION: Bilateral pleural effusions and interstitial as well as alveolar airspace opacities concerning for an element of pulmonary edema versus infection. The right lower lobe airspace disease is similar to the prior CT chest dated 05/14/2015. Electronically Signed   By: Kathreen Devoid   On: 05/25/2015 02:16   Ct Head Wo Contrast  05/14/2015  CLINICAL DATA:  [place].  Anticoagulated. EXAM: CT HEAD WITHOUT CONTRAST CT CERVICAL SPINE WITHOUT CONTRAST TECHNIQUE: Multidetector CT imaging of the head and cervical spine was performed following the standard protocol without intravenous contrast. Multiplanar CT image reconstructions of the cervical spine were also generated. COMPARISON:  None. FINDINGS: CT HEAD FINDINGS There is no intracranial hemorrhage or extra-axial fluid collection. There is moderately severe generalized atrophy. There is mild white matter hypodensity consistent with chronic small vessel  disease. Calvarium and skullbase are intact. CT CERVICAL SPINE FINDINGS The vertebral column, pedicles and facet articulations are intact. There is no evidence of acute fracture. No acute soft tissue abnormalities are evident. Mild arthritic changes are evident in the midcervical spine. Severe atlantoaxial degenerative changes are present. IMPRESSION: 1. Negative for acute intracranial traumatic injury. There is moderately severe generalized atrophy and chronic small vessel disease. 2. Negative for acute cervical spine fracture Electronically Signed   By: Andreas Newport M.D.   On: 05/14/2015 02:40   Ct Chest W Contrast  05/14/2015  CLINICAL DATA:  Lower back and hip pain after falling. EXAM: CT CHEST, ABDOMEN, AND PELVIS WITH CONTRAST TECHNIQUE: Multidetector CT imaging of the chest, abdomen and  pelvis was performed following the standard protocol during bolus administration of intravenous contrast. CONTRAST:  53m OMNIPAQUE IOHEXOL 300 MG/ML  SOLN COMPARISON:  01/03/2015, 11/30/2014, 04/24/2014. FINDINGS: CT CHEST FINDINGS Mediastinum/Nodes: The mediastinum and intrathoracic vascular structures are intact. There is no mediastinal hemorrhage. Lungs/Pleura: There is no pneumothorax. There is no effusion. There is unchanged right lower lobe collapse and consolidation. Musculoskeletal: No acute fracture is evident. There is chronic moderately displaced fracture of the sternum. There is severe thoracic kyphosis with chronic severe compressions of T6-T8, compression of T12, and moderate superior endplate impaction at L3. These are unchanged. Prior kyphoplasties again noted in the thoracic spine. Multiple chronic rib fracture deformities are present. CT ABDOMEN PELVIS FINDINGS Hepatobiliary: A few unchanged sub cm hepatic hypodensities are present. There is no hepatic laceration. Pancreas: Unremarkable Spleen: Normal except for a small unchanged hypodense focus anteriorly. Adrenals/Urinary Tract: Adrenals and kidneys are unremarkable. Ureters are unremarkable. Urinary bladder is moderately distended but appears intact. Stomach/Bowel: Bowel appears intact. No peritoneal free air or blood. Vascular/Lymphatic: The abdominal aorta is normal in caliber with extensive atherosclerotic calcification. Reproductive: Unremarkable Musculoskeletal: There is diastases of the left sacroiliac joint. There are fractures of the left iliac bone adjacent to the sacroiliac joint, minimally displaced. There are moderately displaced left superior and inferior pubic rami fractures. The hips and acetabuli are intact. Pubic symphysis is intact. IMPRESSION: 1. Negative for acute intrathoracic traumatic injury. 2. There are acute pelvic fractures including the left pubic rami, and left iliac bone adjacent to the SI joint. There is diastases  of the left SI joint. 3. No significant hematoma associated with these pelvic fractures. 4. No evidence of bowel or intra-abdominal parenchymal organ injury. Electronically Signed   By: DAndreas NewportM.D.   On: 05/14/2015 03:00   Ct Cervical Spine Wo Contrast  05/14/2015  CLINICAL DATA:  [place]  Anticoagulated. EXAM: CT HEAD WITHOUT CONTRAST CT CERVICAL SPINE WITHOUT CONTRAST TECHNIQUE: Multidetector CT imaging of the head and cervical spine was performed following the standard protocol without intravenous contrast. Multiplanar CT image reconstructions of the cervical spine were also generated. COMPARISON:  None. FINDINGS: CT HEAD FINDINGS There is no intracranial hemorrhage or extra-axial fluid collection. There is moderately severe generalized atrophy. There is mild white matter hypodensity consistent with chronic small vessel disease. Calvarium and skullbase are intact. CT CERVICAL SPINE FINDINGS The vertebral column, pedicles and facet articulations are intact. There is no evidence of acute fracture. No acute soft tissue abnormalities are evident. Mild arthritic changes are evident in the midcervical spine. Severe atlantoaxial degenerative changes are present. IMPRESSION: 1. Negative for acute intracranial traumatic injury. There is moderately severe generalized atrophy and chronic small vessel disease. 2. Negative for acute cervical spine fracture Electronically Signed   By: DShaune Pascal  Alroy Dust M.D.   On: 05/14/2015 02:40   Ct Abdomen Pelvis W Contrast  05/14/2015  CLINICAL DATA:  Lower back and hip pain after falling. EXAM: CT CHEST, ABDOMEN, AND PELVIS WITH CONTRAST TECHNIQUE: Multidetector CT imaging of the chest, abdomen and pelvis was performed following the standard protocol during bolus administration of intravenous contrast. CONTRAST:  32m OMNIPAQUE IOHEXOL 300 MG/ML  SOLN COMPARISON:  01/03/2015, 11/30/2014, 04/24/2014. FINDINGS: CT CHEST FINDINGS Mediastinum/Nodes: The mediastinum and  intrathoracic vascular structures are intact. There is no mediastinal hemorrhage. Lungs/Pleura: There is no pneumothorax. There is no effusion. There is unchanged right lower lobe collapse and consolidation. Musculoskeletal: No acute fracture is evident. There is chronic moderately displaced fracture of the sternum. There is severe thoracic kyphosis with chronic severe compressions of T6-T8, compression of T12, and moderate superior endplate impaction at L3. These are unchanged. Prior kyphoplasties again noted in the thoracic spine. Multiple chronic rib fracture deformities are present. CT ABDOMEN PELVIS FINDINGS Hepatobiliary: A few unchanged sub cm hepatic hypodensities are present. There is no hepatic laceration. Pancreas: Unremarkable Spleen: Normal except for a small unchanged hypodense focus anteriorly. Adrenals/Urinary Tract: Adrenals and kidneys are unremarkable. Ureters are unremarkable. Urinary bladder is moderately distended but appears intact. Stomach/Bowel: Bowel appears intact. No peritoneal free air or blood. Vascular/Lymphatic: The abdominal aorta is normal in caliber with extensive atherosclerotic calcification. Reproductive: Unremarkable Musculoskeletal: There is diastases of the left sacroiliac joint. There are fractures of the left iliac bone adjacent to the sacroiliac joint, minimally displaced. There are moderately displaced left superior and inferior pubic rami fractures. The hips and acetabuli are intact. Pubic symphysis is intact. IMPRESSION: 1. Negative for acute intrathoracic traumatic injury. 2. There are acute pelvic fractures including the left pubic rami, and left iliac bone adjacent to the SI joint. There is diastases of the left SI joint. 3. No significant hematoma associated with these pelvic fractures. 4. No evidence of bowel or intra-abdominal parenchymal organ injury. Electronically Signed   By: DAndreas NewportM.D.   On: 05/14/2015 03:00   Dg Chest Port 1 View  05/26/2015   CLINICAL DATA:  Short of breath. Also complaining of back pain and pelvic pain. Subsequent encounter. EXAM: PORTABLE CHEST 1 VIEW COMPARISON:  05/25/2015 FINDINGS: Bilateral pleural effusions are without change from the prior study. Interstitial thickening and mild hazy central airspace opacity noted previously has mildly improved. There are no new areas of lung opacity. Cardiac silhouette is mildly enlarged. No pneumothorax. IMPRESSION: 1. Mild improvement in presumed pulmonary edema. There are persistent pleural effusions obscuring the hemidiaphragms. No new lung abnormalities. Electronically Signed   By: DLajean ManesM.D.   On: 05/26/2015 20:29   Dg Hip Unilat With Pelvis 2-3 Views Left  05/13/2015  CLINICAL DATA:  [place] EXAM: DG HIP (WITH OR WITHOUT PELVIS) 2-3V LEFT COMPARISON:  None. FINDINGS: There are fractures of the left superior and inferior pubic rami with displacement. Bones are osteopenic. The left femoral neck is grossly intact. IMPRESSION: Osteopenia Displaced fractures of the left superior and inferior pubic rami. Electronically Signed   By: AMarybelle KillingsM.D.   On: 05/13/2015 23:38     CBC  Recent Labs Lab 05/25/15 0203 05/26/15 0555 05/26/15 1523 05/27/15 0515 05/28/15 0344  WBC 10.1 7.4  --  8.7 8.4  HGB 9.5* 9.0*  --  8.3* 8.4*  HCT 28.2* 26.8* 26.3* 24.6* 24.8*  PLT 290 305  --  301 296  MCV 89.0 88.7  --  89.1 90.2  MCH  30.0 29.8  --  30.1 30.5  MCHC 33.7 33.6  --  33.7 33.9  RDW 14.8 15.4  --  15.5 15.9*  LYMPHSABS 0.7  --   --   --   --   MONOABS 0.8  --   --   --   --   EOSABS 0.1  --   --   --   --   BASOSABS 0.0  --   --   --   --     Chemistries   Recent Labs Lab 05/25/15 1210 05/26/15 0555 05/27/15 0515 05/28/15 0344 05/29/15 0346  NA 125* 123* 127* 127* 132*  K 4.4 4.1 3.5 4.1 3.6  CL 89* 85* 88* 86* 85*  CO2 '29 31 28 '$ 32 39*  GLUCOSE 119* 81 95 202* 148*  BUN 35* 32* 39* 35* 36*  CREATININE 2.10* 2.04* 1.76* 1.63* 1.54*  CALCIUM 8.0* 7.9*  7.8* 7.6* 7.9*  AST  --  26 27  --   --   ALT  --  13* 15  --   --   ALKPHOS  --  94 108  --   --   BILITOT  --  1.0 0.6  --   --    ------------------------------------------------------------------------------------------------------------------ estimated creatinine clearance is 19.8 mL/min (by C-G formula based on Cr of 1.54). ------------------------------------------------------------------------------------------------------------------ No results for input(s): HGBA1C in the last 72 hours. ------------------------------------------------------------------------------------------------------------------ No results for input(s): CHOL, HDL, LDLCALC, TRIG, CHOLHDL, LDLDIRECT in the last 72 hours. ------------------------------------------------------------------------------------------------------------------ No results for input(s): TSH, T4TOTAL, T3FREE, THYROIDAB in the last 72 hours.  Invalid input(s): FREET3 ------------------------------------------------------------------------------------------------------------------ No results for input(s): VITAMINB12, FOLATE, FERRITIN, TIBC, IRON, RETICCTPCT in the last 72 hours.  Coagulation profile  Recent Labs Lab 05/25/15 0533 05/26/15 0555 05/27/15 0515 05/28/15 0344 05/29/15 0346  INR 1.47 1.41 1.29 1.67* 1.82*    No results for input(s): DDIMER in the last 72 hours.  Cardiac Enzymes No results for input(s): CKMB, TROPONINI, MYOGLOBIN in the last 168 hours.  Invalid input(s): CK ------------------------------------------------------------------------------------------------------------------ Invalid input(s): POCBNP   CBG:  Recent Labs Lab 05/28/15 1140 05/28/15 1708 05/28/15 2143 05/29/15 0752 05/29/15 1131  GLUCAP 269* 153* 390* 228* 186*       Studies: No results found.    Lab Results  Component Value Date   HGBA1C 8.9* 11/07/2014   HGBA1C 8.7* 01/23/2014   HGBA1C 8.0* 11/08/2013   Lab Results   Component Value Date   LDLCALC 75 11/07/2014   CREATININE 1.54* 05/29/2015       Scheduled Meds: . acetaZOLAMIDE  250 mg Intravenous Q12H  . atorvastatin  10 mg Oral Daily  . enoxaparin (LOVENOX) injection  30 mg Subcutaneous Q24H  . furosemide  60 mg Intravenous Q12H  . insulin aspart  0-5 Units Subcutaneous QHS  . insulin aspart  0-9 Units Subcutaneous TID WC  . insulin glargine  15 Units Subcutaneous q1800  . sodium chloride  3 mL Intravenous Q12H  . warfarin  5 mg Oral ONCE-1800  . Warfarin - Pharmacist Dosing Inpatient   Does not apply q1800   Continuous Infusions:    Principal Problem:   Acute on chronic diastolic (congestive) heart failure (HCC) Active Problems:   Diabetes mellitus type 2, insulin dependent (HCC)   ANEMIA, SECONDARY TO BLOOD LOSS   Essential hypertension   Atrial fibrillation (HCC)   Long term current use of anticoagulant   Severe tricuspid regurgitation   Moderate to severe pulmonary hypertension (HCC)   Fluid overload  AKI (acute kidney injury) ([place])   Pressure ulcer    Time spent: 41 minutes   [facility] Pager [phone]. If 7PM-7AM, please contact night-coverage at [email or web address], password [facility] 05/29/2015, 3:26 PM  LOS: 4 days

## 2015-05-29 NOTE — Progress Notes (Signed)
Assessment/Plan: 1. AKI- hemodynamically mediated now with decompensated diastolic CHF. Slowly improving 2. Hyponatremia- hypervolemic---improving  3. Acute decompensated diastolic CHF 4. Hyperkalemia- resolved off of aldactone 5. Pelvic fracture, Pulmonary HTN, ABLA - per primary svc 6. Met alkalosis  Rx acetazolamide and reduce furosemide, KCL  Subjective: Interval History: Back pain  Objective: Vital signs in last 24 hours: Temp:  [97.6 F (36.4 C)-98.1 F (36.7 C)] 97.6 F (36.4 C) (10/19 0500) Pulse Rate:  [94-99] 94 (10/19 0500) Resp:  [16-18] 18 (10/19 0500) BP: (106-146)/(52-72) 127/72 mmHg (10/19 0500) SpO2:  [100 %] 100 % (10/19 0500) Weight:  [54.7 kg (120 lb 9.5 oz)] 54.7 kg (120 lb 9.5 oz) (10/18 2032) Weight change: -0.9 kg (-1 lb 15.7 oz)  Intake/Output from previous day: 10/18 0701 - 10/19 0700 In: 720 [P.O.:720] Out: 725 [Urine:725] Intake/Output this shift: Total I/O In: 240 [P.O.:240] Out: 200 [Urine:200]  General appearance: alert and cooperative Resp: poor effort Cardio: regular rate and rhythm, S1, S2 normal, no murmur, click, rub or gallop Extremities: edema 1-2+  Lab Results:  Recent Labs  05/27/15 0515 05/28/15 0344  WBC 8.7 8.4  HGB 8.3* 8.4*  HCT 24.6* 24.8*  PLT 301 296   BMET:  Recent Labs  05/28/15 0344 05/29/15 0346  NA 127* 132*  K 4.1 3.6  CL 86* 85*  CO2 32 39*  GLUCOSE 202* 148*  BUN 35* 36*  CREATININE 1.63* 1.54*  CALCIUM 7.6* 7.9*   No results for input(s): PTH in the last 72 hours. Iron Studies:  Recent Labs  05/26/15 1523  IRON 34  TIBC 238*  FERRITIN 202   Studies/Results: No results found.  Scheduled: . atorvastatin  10 mg Oral Daily  . enoxaparin (LOVENOX) injection  30 mg Subcutaneous Q24H  . furosemide  80 mg Intravenous Q8H  . insulin aspart  0-5 Units Subcutaneous QHS  . insulin aspart  0-9 Units Subcutaneous TID WC  . insulin glargine  15 Units Subcutaneous q1800  . sodium chloride   3 mL Intravenous Q12H  . Warfarin - Pharmacist Dosing Inpatient   Does not apply q1800     LOS: 4 days   Jadie Comas C 05/29/2015,10:17 AM

## 2015-05-29 NOTE — Progress Notes (Signed)
Inpatient Diabetes Program Recommendations  AACE/ADA: New Consensus Statement on Inpatient Glycemic Control (2015)  Target Ranges:  Prepandial:   less than 140 mg/dL      Peak postprandial:   less than 180 mg/dL (1-2 hours)      Critically ill patients:  140 - 180 mg/dL   Review of Glycemic Control  Diabetes history: DM 2 Outpatient Diabetes medications: Toujeo 10 units Daily, Novolog Moderate Current orders for Inpatient glycemic control: Lantus 15 units, Novolog Sensitive TID + HS scale  Inpatient Diabetes Program Recommendations: Correction (SSI): Glucose went up into the 200's yesterday at meal times. Please consider increasing correction to at least Novolog Moderate scale TID.  Thanks,  Tama Headings RN, MSN, [name] Inpatient Diabetes Coordinator Team Pager [phone] (8a-5p)

## 2015-05-30 LAB — CBC
HEMATOCRIT: 25.7 % — AB (ref 36.0–46.0)
Hemoglobin: 8.5 g/dL — ABNORMAL LOW (ref 12.0–15.0)
MCH: 30.6 pg (ref 26.0–34.0)
MCHC: 33.1 g/dL (ref 30.0–36.0)
MCV: 92.4 fL (ref 78.0–100.0)
PLATELETS: 309 10*3/uL (ref 150–400)
RBC: 2.78 MIL/uL — AB (ref 3.87–5.11)
RDW: 16.4 % — AB (ref 11.5–15.5)
WBC: 10.8 10*3/uL — AB (ref 4.0–10.5)

## 2015-05-30 LAB — BASIC METABOLIC PANEL
Anion gap: 13 (ref 5–15)
BUN: 40 mg/dL — ABNORMAL HIGH (ref 6–20)
CHLORIDE: 87 mmol/L — AB (ref 101–111)
CO2: 33 mmol/L — ABNORMAL HIGH (ref 22–32)
CREATININE: 1.61 mg/dL — AB (ref 0.44–1.00)
Calcium: 8 mg/dL — ABNORMAL LOW (ref 8.9–10.3)
GFR, EST AFRICAN AMERICAN: 32 mL/min — AB (ref >60)
GFR, EST NON AFRICAN AMERICAN: 28 mL/min — AB (ref >60)
Glucose, Bld: 205 mg/dL — ABNORMAL HIGH (ref 65–99)
POTASSIUM: 4.5 mmol/L (ref 3.5–5.1)
SODIUM: 133 mmol/L — AB (ref 135–145)

## 2015-05-30 LAB — PROTIME-INR
INR: 1.94 — AB (ref 0.00–1.49)
Prothrombin Time: 22 seconds — ABNORMAL HIGH (ref 11.6–15.2)

## 2015-05-30 LAB — GLUCOSE, CAPILLARY
GLUCOSE-CAPILLARY: 439 mg/dL — AB (ref 65–99)
GLUCOSE-CAPILLARY: 245 mg/dL — AB (ref 65–99)
GLUCOSE-CAPILLARY: 187 mg/dL — AB (ref 65–99)
Glucose-Capillary: 275 mg/dL — ABNORMAL HIGH (ref 65–99)

## 2015-05-30 MED ORDER — ACETAZOLAMIDE SODIUM 500 MG IJ SOLR
250.0000 mg | Freq: Once | INTRAMUSCULAR | Status: AC
  Administered 2015-05-30: 250 mg via INTRAVENOUS
  Filled 2015-05-30: qty 500

## 2015-05-30 MED ORDER — FUROSEMIDE 80 MG PO TABS
80.0000 mg | ORAL_TABLET | Freq: Two times a day (BID) | ORAL | Status: DC
  Administered 2015-05-30 – 2015-05-31 (×2): 80 mg via ORAL
  Filled 2015-05-30 (×3): qty 1

## 2015-05-30 MED ORDER — INSULIN ASPART 100 UNIT/ML ~~LOC~~ SOLN
15.0000 [IU] | Freq: Once | SUBCUTANEOUS | Status: AC
  Administered 2015-05-30: 15 [IU] via SUBCUTANEOUS

## 2015-05-30 MED ORDER — POLYETHYLENE GLYCOL 3350 17 G PO PACK
17.0000 g | PACK | Freq: Every day | ORAL | Status: DC
  Administered 2015-05-30 – 2015-05-31 (×2): 17 g via ORAL
  Filled 2015-05-30: qty 1

## 2015-05-30 MED ORDER — FUROSEMIDE 40 MG PO TABS: 40.0000 mg | ORAL_TABLET | Freq: Two times a day (BID) | ORAL | Status: DC

## 2015-05-30 MED ORDER — PROMETHAZINE HCL 12.5 MG PO TABS: 12.5000 mg | ORAL_TABLET | Freq: Four times a day (QID) | ORAL | Status: DC | PRN

## 2015-05-30 MED ORDER — TRAMADOL HCL 50 MG PO TABS: 50.0000 mg | ORAL_TABLET | Freq: Four times a day (QID) | ORAL | Status: DC | PRN

## 2015-05-30 MED ORDER — WARFARIN SODIUM 5 MG PO TABS
5.0000 mg | ORAL_TABLET | Freq: Once | ORAL | Status: AC
  Administered 2015-05-30: 5 mg via ORAL
  Filled 2015-05-30: qty 1

## 2015-05-30 MED ORDER — INSULIN GLARGINE 100 UNIT/ML ~~LOC~~ SOLN
5.0000 [IU] | Freq: Every day | SUBCUTANEOUS | Status: DC
  Administered 2015-05-30: 5 [IU] via SUBCUTANEOUS
  Filled 2015-05-30 (×2): qty 0.05

## 2015-05-30 NOTE — Progress Notes (Signed)
ANTICOAGULATION CONSULT NOTE - Follow Up Consult  Pharmacy Consult for coumadin Indication: atrial fibrillation  Allergies  Allergen Reactions  . Codeine Nausea And Vomiting         Patient Measurements: Height: '4\' 11"'$  (149.9 cm) Weight: 119 lb 11.4 oz (54.3 kg) IBW/kg (Calculated) : 43.2  Vital Signs: Temp: 97.4 F (36.3 C) (10/20 0626) Temp Source: Oral (10/20 0626) BP: 110/52 mmHg (10/20 0626) Pulse Rate: 129 (10/20 0626)  Labs:  Recent Labs  05/28/15 0344 05/29/15 0346 05/30/15 0545  HGB 8.4*  --  8.5*  HCT 24.8*  --  25.7*  PLT 296  --  309  LABPROT 19.7* 21.0* 22.0*  INR 1.67* 1.82* 1.94*  CREATININE 1.63* 1.54* 1.61*    Estimated Creatinine Clearance: 18.8 mL/min (by C-G formula based on Cr of 1.61).   Assessment: 81 yo F on warfarin for afib. Coumadin clinic note from 04/17/15 has a reported INR of 1.8- pt was told to take 7.5 mg on 9/7, then 5 mg daily x 2.5 mg on Fridays. However, on admission on 10/15 her warfarin dose from SNF was '2mg'$  daily.  INR this morning 1.94. Hgb low but stable, and plts WNL- no bleeding noted. She continues on prophylactic Lovenox until INR therapeutic.  SCr 1.61 with est CrCl ~15-71m/min.  Goal of Therapy:  INR 2-3 Monitor platelets by anticoagulation protocol: Yes   Plan:  - warfarin '5mg'$  po x1 tonight  - Lovenox '30mg'$  subQ q24h until INR therapeutic - anticipate this can be discontinued tomorrow - daily INR - follow for s/s bleeding  [place] D. Ivanka Kirshner, PharmD, BCPS Clinical Pharmacist Pager: [phone]/20/2016 8:22 AM

## 2015-05-30 NOTE — Progress Notes (Addendum)
[facility] PROGRESS NOTE  Wendy Hopkins ZOX:[id number] DOB: [birth year] DOA: 05/25/2015 PCP: [name], MD  Length of stay: 5   Assessment/Plan: Principal Problem:   Acute on chronic diastolic (congestive) heart failure ([place]) Active Problems:   Diabetes mellitus type 2, insulin dependent (HCC)   ANEMIA, SECONDARY TO BLOOD LOSS   Essential hypertension   Atrial fibrillation ([place])   Long term current use of anticoagulant   Severe tricuspid regurgitation   Moderate to severe pulmonary hypertension (HCC)   Fluid overload   AKI (acute kidney injury) ([place])   Pressure ulcer    Brief summary Wendy Hopkins is a 81 y.o. female with a past medical history significant for HTN, DM, atrial fibrillation on warfarin, chronic diastolic HF, and pulmonary hypertension who presents with fluid overload.  The patient was recently admitted from October 3-5 withpubic ramus fracture nonoperatively treated for Dr. [name]. On the day of discharge the patient's creatinine went from her baseline of 1.2 to 2.7 mg/dL. She had hyperkalemia that day which was treated with Kayexalate and holding spironolactone at discharge.  In the nursing home, the patient was continued on Kayexalate and her furosemide was stopped because there was concern that this was contributing to her renal failure. Unfortunately over the past 10 days the patient has been increasingly edematous, with swelling in her feet, belly, and hands. Today she was short of breath and somewhat altered and so she was brought to the ED.  In the ED, the patient was hyponatremic, had persistent renal failure, and a chest x-ray with pulmonary edema and pleural effusions During this hospitalization nephrology was consulted the patient was found to have hypervolemic hyponatremia and her renal function and sodium improved after aggressive IV diuresis. Nephrology anticipate the patient may be discharged tomorrow    Assessment and  plan  1. Acute on chronic diastolic CHF and pulmonary hypertension:  Secondary to discontinuation of diuretics and hydration with IV fluids. She did have an echocardiogram during her last hospitalization that showed worsening pulmonary hypertension and severe tricuspid regurgitation. EF of 60-65% -BNP-420 Patient's renal function is improving with slow diuresis, diuresis complicated by hyponatremia  Nephrology is going to manage the patient's Lasix and IV acetazolamide Discussed with Dr. Florene Glen Patient's renal function and sodium are improving, anticipated to be discharged tomorrow   2. Hypervolemic Hyponatremia:  Sodium was 140 on 10/4, 127 on 10/5, now 133 improved after being started on IV Lasix Urine osmolality 277, serum osmolality 268    3. Acute renal failure: Baseline 1.2, presented with a creatinine of 2.24, now 1.61 Some improvement overnight with IV Lasix, continue diuresis per nephrology Patient declined Foley catheter which was offered to her because of her pelvic fractures. She is afraid that she will get a UTI  4. HTN:  controlled  5. A. Fib on warfarin:  Stable. Rate controlled. CHADS2Vasc score 4. Continue home warfarin, INR 1.94  6. DM: Patient's son would like the patient to have only 5 units of Lantus despite the fact that the patient's CBG has been in the 300s. He declined 15 units of Lantus yesterday evening. Dose of Lantus has been adjusted to 5 units every morning per patient and son request. Will order diabetes coordinator consult to see him diabetic education may help change her mind.  7. Recent multiple pelvic fractures-patient refuses to take tramadol or oxycodone and continues to complain of pain. She only wants Tylenol.  DVT prophylaxsis Lovenox  Code Status:  Code Status Orders        Start     Ordered   05/25/15 0428  Do not attempt resuscitation (DNR)   Continuous    Question Answer Comment  In the event of cardiac or respiratory  ARREST Do not call a "code blue"   In the event of cardiac or respiratory ARREST Do not perform Intubation, CPR, defibrillation or ACLS   In the event of cardiac or respiratory ARREST Use medication by any route, position, wound care, and other measures to relive pain and suffering. May use oxygen, suction and manual treatment of airway obstruction as needed for comfort.      05/25/15 0427    Advance Directive Documentation        Most Recent Value   Type of Advance Directive  Healthcare Power of Attorney, Out of facility DNR (pink MOST or yellow form)   Pre-existing out of facility DNR order (yellow form or pink MOST form)  Physician notified to receive inpatient order   "MOST" Form in Place?       Family Communication: family updated about patient's clinical progress Disposition Plan:  Anticipate discharge tomorrow, attempt to wean oxygen   Consultants:  Nephrology  Procedures:  None  Antibiotics: Anti-infectives    None         HPI/Subjective: Patient sitting on the commode, denies any shortness of breath but endorses pelvic pain  Objective: Filed Vitals:   05/29/15 1553 05/29/15 2100 05/30/15 0626 05/30/15 0945  BP: 120/53 107/50 110/52 113/60  Pulse: 91 100 129 103  Temp: 98 F (36.7 C) 98.3 F (36.8 C) 97.4 F (36.3 C) 98.8 F (37.1 C)  TempSrc: Oral Oral Oral Oral  Resp: '17 18 16 18  '$ Height:      Weight:  54.3 kg (119 lb 11.4 oz)    SpO2: 100% 100% 100% 100%    Intake/Output Summary (Last 24 hours) at 05/30/15 1052 Last data filed at 05/30/15 0901  Gross per 24 hour  Intake    660 ml  Output   1600 ml  Net   -940 ml    Exam:  General: No acute respiratory distress Lungs: Clear to auscultation bilaterally without wheezes or crackles Cardiovascular: Regular rate and rhythm without murmur gallop or rub normal S1 and S2, by basilar crackles Abdomen: Nontender, nondistended, soft, bowel sounds positive, no rebound, no ascites, no appreciable  mass Extremities: No significant cyanosis, clubbing, or edema bilateral lower extremities     Data Review   Micro Results Recent Results (from the past 240 hour(s))  MRSA PCR Screening     Status: None   Collection Time: 05/25/15  6:26 PM  Result Value Ref Range Status   MRSA by PCR NEGATIVE NEGATIVE Final    Comment:        The GeneXpert MRSA Assay (FDA approved for NASAL specimens only), is one component of a comprehensive MRSA colonization surveillance program. It is not intended to diagnose MRSA infection nor to guide or monitor treatment for MRSA infections.     Radiology Reports Dg Chest 1 View  05/25/2015  CLINICAL DATA:  Weakness, altered mental status EXAM: CHEST 1 VIEW COMPARISON:  CT chest 05/14/2015 FINDINGS: Bilateral small pleural effusions, right greater than left. Right basilar airspace disease similar to the prior exam. There is bilateral interstitial thickening. There is no pneumothorax. The heart and mediastinum are stable. There is thoracic aortic atherosclerosis. There is no acute osseous abnormality. There are old right posterior rib fractures.  IMPRESSION: Bilateral pleural effusions and interstitial as well as alveolar airspace opacities concerning for an element of pulmonary edema versus infection. The right lower lobe airspace disease is similar to the prior CT chest dated 05/14/2015. Electronically Signed   By: Kathreen Devoid   On: 05/25/2015 02:16   Ct Head Wo Contrast  05/14/2015  CLINICAL DATA:  [place].  Anticoagulated. EXAM: CT HEAD WITHOUT CONTRAST CT CERVICAL SPINE WITHOUT CONTRAST TECHNIQUE: Multidetector CT imaging of the head and cervical spine was performed following the standard protocol without intravenous contrast. Multiplanar CT image reconstructions of the cervical spine were also generated. COMPARISON:  None. FINDINGS: CT HEAD FINDINGS There is no intracranial hemorrhage or extra-axial fluid collection. There is moderately severe generalized  atrophy. There is mild white matter hypodensity consistent with chronic small vessel disease. Calvarium and skullbase are intact. CT CERVICAL SPINE FINDINGS The vertebral column, pedicles and facet articulations are intact. There is no evidence of acute fracture. No acute soft tissue abnormalities are evident. Mild arthritic changes are evident in the midcervical spine. Severe atlantoaxial degenerative changes are present. IMPRESSION: 1. Negative for acute intracranial traumatic injury. There is moderately severe generalized atrophy and chronic small vessel disease. 2. Negative for acute cervical spine fracture Electronically Signed   By: Andreas Newport M.D.   On: 05/14/2015 02:40   Ct Chest W Contrast  05/14/2015  CLINICAL DATA:  Lower back and hip pain after falling. EXAM: CT CHEST, ABDOMEN, AND PELVIS WITH CONTRAST TECHNIQUE: Multidetector CT imaging of the chest, abdomen and pelvis was performed following the standard protocol during bolus administration of intravenous contrast. CONTRAST:  67m OMNIPAQUE IOHEXOL 300 MG/ML  SOLN COMPARISON:  01/03/2015, 11/30/2014, 04/24/2014. FINDINGS: CT CHEST FINDINGS Mediastinum/Nodes: The mediastinum and intrathoracic vascular structures are intact. There is no mediastinal hemorrhage. Lungs/Pleura: There is no pneumothorax. There is no effusion. There is unchanged right lower lobe collapse and consolidation. Musculoskeletal: No acute fracture is evident. There is chronic moderately displaced fracture of the sternum. There is severe thoracic kyphosis with chronic severe compressions of T6-T8, compression of T12, and moderate superior endplate impaction at L3. These are unchanged. Prior kyphoplasties again noted in the thoracic spine. Multiple chronic rib fracture deformities are present. CT ABDOMEN PELVIS FINDINGS Hepatobiliary: A few unchanged sub cm hepatic hypodensities are present. There is no hepatic laceration. Pancreas: Unremarkable Spleen: Normal except for a  small unchanged hypodense focus anteriorly. Adrenals/Urinary Tract: Adrenals and kidneys are unremarkable. Ureters are unremarkable. Urinary bladder is moderately distended but appears intact. Stomach/Bowel: Bowel appears intact. No peritoneal free air or blood. Vascular/Lymphatic: The abdominal aorta is normal in caliber with extensive atherosclerotic calcification. Reproductive: Unremarkable Musculoskeletal: There is diastases of the left sacroiliac joint. There are fractures of the left iliac bone adjacent to the sacroiliac joint, minimally displaced. There are moderately displaced left superior and inferior pubic rami fractures. The hips and acetabuli are intact. Pubic symphysis is intact. IMPRESSION: 1. Negative for acute intrathoracic traumatic injury. 2. There are acute pelvic fractures including the left pubic rami, and left iliac bone adjacent to the SI joint. There is diastases of the left SI joint. 3. No significant hematoma associated with these pelvic fractures. 4. No evidence of bowel or intra-abdominal parenchymal organ injury. Electronically Signed   By: DAndreas NewportM.D.   On: 05/14/2015 03:00   Ct Cervical Spine Wo Contrast  05/14/2015  CLINICAL DATA:  [place]  Anticoagulated. EXAM: CT HEAD WITHOUT CONTRAST CT CERVICAL SPINE WITHOUT CONTRAST TECHNIQUE: Multidetector CT imaging of the head  and cervical spine was performed following the standard protocol without intravenous contrast. Multiplanar CT image reconstructions of the cervical spine were also generated. COMPARISON:  None. FINDINGS: CT HEAD FINDINGS There is no intracranial hemorrhage or extra-axial fluid collection. There is moderately severe generalized atrophy. There is mild white matter hypodensity consistent with chronic small vessel disease. Calvarium and skullbase are intact. CT CERVICAL SPINE FINDINGS The vertebral column, pedicles and facet articulations are intact. There is no evidence of acute fracture. No acute soft tissue  abnormalities are evident. Mild arthritic changes are evident in the midcervical spine. Severe atlantoaxial degenerative changes are present. IMPRESSION: 1. Negative for acute intracranial traumatic injury. There is moderately severe generalized atrophy and chronic small vessel disease. 2. Negative for acute cervical spine fracture Electronically Signed   By: Andreas Newport M.D.   On: 05/14/2015 02:40   Ct Abdomen Pelvis W Contrast  05/14/2015  CLINICAL DATA:  Lower back and hip pain after falling. EXAM: CT CHEST, ABDOMEN, AND PELVIS WITH CONTRAST TECHNIQUE: Multidetector CT imaging of the chest, abdomen and pelvis was performed following the standard protocol during bolus administration of intravenous contrast. CONTRAST:  88m OMNIPAQUE IOHEXOL 300 MG/ML  SOLN COMPARISON:  01/03/2015, 11/30/2014, 04/24/2014. FINDINGS: CT CHEST FINDINGS Mediastinum/Nodes: The mediastinum and intrathoracic vascular structures are intact. There is no mediastinal hemorrhage. Lungs/Pleura: There is no pneumothorax. There is no effusion. There is unchanged right lower lobe collapse and consolidation. Musculoskeletal: No acute fracture is evident. There is chronic moderately displaced fracture of the sternum. There is severe thoracic kyphosis with chronic severe compressions of T6-T8, compression of T12, and moderate superior endplate impaction at L3. These are unchanged. Prior kyphoplasties again noted in the thoracic spine. Multiple chronic rib fracture deformities are present. CT ABDOMEN PELVIS FINDINGS Hepatobiliary: A few unchanged sub cm hepatic hypodensities are present. There is no hepatic laceration. Pancreas: Unremarkable Spleen: Normal except for a small unchanged hypodense focus anteriorly. Adrenals/Urinary Tract: Adrenals and kidneys are unremarkable. Ureters are unremarkable. Urinary bladder is moderately distended but appears intact. Stomach/Bowel: Bowel appears intact. No peritoneal free air or blood.  Vascular/Lymphatic: The abdominal aorta is normal in caliber with extensive atherosclerotic calcification. Reproductive: Unremarkable Musculoskeletal: There is diastases of the left sacroiliac joint. There are fractures of the left iliac bone adjacent to the sacroiliac joint, minimally displaced. There are moderately displaced left superior and inferior pubic rami fractures. The hips and acetabuli are intact. Pubic symphysis is intact. IMPRESSION: 1. Negative for acute intrathoracic traumatic injury. 2. There are acute pelvic fractures including the left pubic rami, and left iliac bone adjacent to the SI joint. There is diastases of the left SI joint. 3. No significant hematoma associated with these pelvic fractures. 4. No evidence of bowel or intra-abdominal parenchymal organ injury. Electronically Signed   By: DAndreas NewportM.D.   On: 05/14/2015 03:00   Dg Chest Port 1 View  05/26/2015  CLINICAL DATA:  Short of breath. Also complaining of back pain and pelvic pain. Subsequent encounter. EXAM: PORTABLE CHEST 1 VIEW COMPARISON:  05/25/2015 FINDINGS: Bilateral pleural effusions are without change from the prior study. Interstitial thickening and mild hazy central airspace opacity noted previously has mildly improved. There are no new areas of lung opacity. Cardiac silhouette is mildly enlarged. No pneumothorax. IMPRESSION: 1. Mild improvement in presumed pulmonary edema. There are persistent pleural effusions obscuring the hemidiaphragms. No new lung abnormalities. Electronically Signed   By: DLajean ManesM.D.   On: 05/26/2015 20:29   Dg Hip Unilat With  Pelvis 2-3 Views Left  05/13/2015  CLINICAL DATA:  [place] EXAM: DG HIP (WITH OR WITHOUT PELVIS) 2-3V LEFT COMPARISON:  None. FINDINGS: There are fractures of the left superior and inferior pubic rami with displacement. Bones are osteopenic. The left femoral neck is grossly intact. IMPRESSION: Osteopenia Displaced fractures of the left superior and inferior  pubic rami. Electronically Signed   By: Marybelle Killings M.D.   On: 05/13/2015 23:38     CBC  Recent Labs Lab 05/25/15 0203 05/26/15 0555 05/26/15 1523 05/27/15 0515 05/28/15 0344 05/30/15 0545  WBC 10.1 7.4  --  8.7 8.4 10.8*  HGB 9.5* 9.0*  --  8.3* 8.4* 8.5*  HCT 28.2* 26.8* 26.3* 24.6* 24.8* 25.7*  PLT 290 305  --  301 296 309  MCV 89.0 88.7  --  89.1 90.2 92.4  MCH 30.0 29.8  --  30.1 30.5 30.6  MCHC 33.7 33.6  --  33.7 33.9 33.1  RDW 14.8 15.4  --  15.5 15.9* 16.4*  LYMPHSABS 0.7  --   --   --   --   --   MONOABS 0.8  --   --   --   --   --   EOSABS 0.1  --   --   --   --   --   BASOSABS 0.0  --   --   --   --   --     Chemistries   Recent Labs Lab 05/26/15 0555 05/27/15 0515 05/28/15 0344 05/29/15 0346 05/30/15 0545  NA 123* 127* 127* 132* 133*  K 4.1 3.5 4.1 3.6 4.5  CL 85* 88* 86* 85* 87*  CO2 31 28 32 39* 33*  GLUCOSE 81 95 202* 148* 205*  BUN 32* 39* 35* 36* 40*  CREATININE 2.04* 1.76* 1.63* 1.54* 1.61*  CALCIUM 7.9* 7.8* 7.6* 7.9* 8.0*  AST 26 27  --   --   --   ALT 13* 15  --   --   --   ALKPHOS 94 108  --   --   --   BILITOT 1.0 0.6  --   --   --    ------------------------------------------------------------------------------------------------------------------ estimated creatinine clearance is 18.8 mL/min (by C-G formula based on Cr of 1.61). ------------------------------------------------------------------------------------------------------------------ No results for input(s): HGBA1C in the last 72 hours. ------------------------------------------------------------------------------------------------------------------ No results for input(s): CHOL, HDL, LDLCALC, TRIG, CHOLHDL, LDLDIRECT in the last 72 hours. ------------------------------------------------------------------------------------------------------------------ No results for input(s): TSH, T4TOTAL, T3FREE, THYROIDAB in the last 72 hours.  Invalid input(s):  FREET3 ------------------------------------------------------------------------------------------------------------------ No results for input(s): VITAMINB12, FOLATE, FERRITIN, TIBC, IRON, RETICCTPCT in the last 72 hours.  Coagulation profile  Recent Labs Lab 05/26/15 0555 05/27/15 0515 05/28/15 0344 05/29/15 0346 05/30/15 0545  INR 1.41 1.29 1.67* 1.82* 1.94*    No results for input(s): DDIMER in the last 72 hours.  Cardiac Enzymes No results for input(s): CKMB, TROPONINI, MYOGLOBIN in the last 168 hours.  Invalid input(s): CK ------------------------------------------------------------------------------------------------------------------ Invalid input(s): POCBNP   CBG:  Recent Labs Lab 05/29/15 0752 05/29/15 1131 05/29/15 1647 05/29/15 2058 05/30/15 0751  GLUCAP 228* 186* 299* 299* 275*       Studies: No results found.    Lab Results  Component Value Date   HGBA1C 8.9* 11/07/2014   HGBA1C 8.7* 01/23/2014   HGBA1C 8.0* 11/08/2013   Lab Results  Component Value Date   LDLCALC 75 11/07/2014   CREATININE 1.61* 05/30/2015       Scheduled Meds: . atorvastatin  10 mg  Oral Daily  . enoxaparin (LOVENOX) injection  30 mg Subcutaneous Q24H  . furosemide  60 mg Intravenous Q12H  . insulin aspart  0-5 Units Subcutaneous QHS  . insulin aspart  0-9 Units Subcutaneous TID WC  . insulin glargine  15 Units Subcutaneous q1800  . sodium chloride  3 mL Intravenous Q12H  . warfarin  5 mg Oral ONCE-1800  . Warfarin - Pharmacist Dosing Inpatient   Does not apply q1800   Continuous Infusions:    Principal Problem:   Acute on chronic diastolic (congestive) heart failure (HCC) Active Problems:   Diabetes mellitus type 2, insulin dependent (HCC)   ANEMIA, SECONDARY TO BLOOD LOSS   Essential hypertension   Atrial fibrillation (HCC)   Long term current use of anticoagulant   Severe tricuspid regurgitation   Moderate to severe pulmonary hypertension (HCC)    Fluid overload   AKI (acute kidney injury) ([place])   Pressure ulcer    Time spent: 45 minutes   [facility] Pager [phone]. If 7PM-7AM, please contact night-coverage at [email or web address], password [facility] 05/30/2015, 10:52 AM  LOS: 5 days

## 2015-05-30 NOTE — Progress Notes (Signed)
Assessment/Plan: 1. AKI- hemodynamically mediated now with decompensated diastolic CHF. Slowly improving 2. Hyponatremia- hypervolemic---improving  3. Acute decompensated diastolic CHF 4. Hyperkalemia- resolved off of aldactone 5. Pelvic fracture, Pulmonary HTN, ABLA - per primary svc 6. Met alkalosis Rx acetazolamide x 1 today and change to PO furosemide.    Subjective: Interval History: c/o abd discomfort  Objective: Vital signs in last 24 hours: Temp:  [97.4 F (36.3 C)-98.8 F (37.1 C)] 98.8 F (37.1 C) (10/20 0945) Pulse Rate:  [91-129] 103 (10/20 0945) Resp:  [16-18] 18 (10/20 0945) BP: (107-120)/(50-60) 113/60 mmHg (10/20 0945) SpO2:  [100 %] 100 % (10/20 0945) Weight:  [54.3 kg (119 lb 11.4 oz)] 54.3 kg (119 lb 11.4 oz) (10/19 2100) Weight change: -0.4 kg (-14.1 oz)  Intake/Output from previous day: 10/19 0701 - 10/20 0700 In: 540 [P.O.:540] Out: 1800 [Urine:1800] Intake/Output this shift: Total I/O In: 360 [P.O.:360] Out: -   General appearance: alert and mild distress Resp: poor effort Cardio: regular rate and rhythm, S1, S2 normal, no murmur, click, rub or gallop GI: sl distended,mild rlq tenderness Extremities: edema 2-3+  Lab Results:  Recent Labs  05/28/15 0344 05/30/15 0545  WBC 8.4 10.8*  HGB 8.4* 8.5*  HCT 24.8* 25.7*  PLT 296 309   BMET:  Recent Labs  05/29/15 0346 05/30/15 0545  NA 132* 133*  K 3.6 4.5  CL 85* 87*  CO2 39* 33*  GLUCOSE 148* 205*  BUN 36* 40*  CREATININE 1.54* 1.61*  CALCIUM 7.9* 8.0*   No results for input(s): PTH in the last 72 hours. Iron Studies: No results for input(s): IRON, TIBC, TRANSFERRIN, FERRITIN in the last 72 hours. Studies/Results: No results found.  Scheduled: . atorvastatin  10 mg Oral Daily  . enoxaparin (LOVENOX) injection  30 mg Subcutaneous Q24H  . furosemide  80 mg Oral BID  . insulin aspart  0-5 Units Subcutaneous QHS  . insulin aspart  0-9 Units Subcutaneous TID WC  . insulin  glargine  15 Units Subcutaneous q1800  . sodium chloride  3 mL Intravenous Q12H  . warfarin  5 mg Oral ONCE-1800  . Warfarin - Pharmacist Dosing Inpatient   Does not apply q1800     LOS: 5 days   Pablo Mathurin C 05/30/2015,10:55 AM

## 2015-05-30 NOTE — Clinical Social Work Note (Signed)
Patient was scheduled to discharge back to [facility] today, however discharge was cancelled by MD.  Family aware of change. CSW will follow-up with family on Friday, 7/21 regarding.   Avin Gibbons Givens, MSW, LCSW Licensed Clinical Social Worker [place] [phone]

## 2015-05-30 NOTE — Progress Notes (Signed)
CBG 439. Dr. Allyson Sabal notified. New orders received.  Joellen Jersey, RN.

## 2015-05-30 NOTE — Progress Notes (Signed)
Inpatient Diabetes Program Recommendations  AACE/ADA: New Consensus Statement on Inpatient Glycemic Control (2015)  Target Ranges:  Prepandial:   less than 140 mg/dL      Peak postprandial:   less than 180 mg/dL (1-2 hours)      Critically ill patients:  140 - 180 mg/dL   Consult Note:  Per son Home Med Rec in computer is not accurate. Patient takes Lantus 5 units Daily at home not [place]. Spoke with son about the differences in the types of insulin used in the hospital. Also spoke to the son about basal insulin and it being safe to give at night. Explained stress and sickness response in the body in relation to hyperglycemia. Patient's son is agreeable to no more than 7 units of basal being given in addition to a NovologSensitive correction. Noted patient has not received any basal since admission due to family refusal. Noted Glucose above 400 at lunch. Due to hyperglycemia maybe son will be more willing to give patient more insulin.  Thanks,  Tama Headings RN, MSN, [facility] Inpatient Diabetes Coordinator Team Pager [phone] (8a-5p)

## 2015-05-31 DIAGNOSIS — I10 Essential (primary) hypertension: Secondary | ICD-10-CM | POA: Diagnosis not present

## 2015-05-31 DIAGNOSIS — E559 Vitamin D deficiency, unspecified: Secondary | ICD-10-CM | POA: Diagnosis not present

## 2015-05-31 DIAGNOSIS — N390 Urinary tract infection, site not specified: Secondary | ICD-10-CM | POA: Diagnosis not present

## 2015-05-31 DIAGNOSIS — L89322 Pressure ulcer of left buttock, stage 2: Secondary | ICD-10-CM | POA: Diagnosis not present

## 2015-05-31 DIAGNOSIS — R112 Nausea with vomiting, unspecified: Secondary | ICD-10-CM | POA: Diagnosis not present

## 2015-05-31 DIAGNOSIS — R531 Weakness: Secondary | ICD-10-CM | POA: Diagnosis not present

## 2015-05-31 DIAGNOSIS — S32810D Multiple fractures of pelvis with stable disruption of pelvic ring, subsequent encounter for fracture with routine healing: Secondary | ICD-10-CM | POA: Diagnosis not present

## 2015-05-31 DIAGNOSIS — R6889 Other general symptoms and signs: Secondary | ICD-10-CM | POA: Diagnosis not present

## 2015-05-31 DIAGNOSIS — D638 Anemia in other chronic diseases classified elsewhere: Secondary | ICD-10-CM | POA: Diagnosis not present

## 2015-05-31 DIAGNOSIS — E119 Type 2 diabetes mellitus without complications: Secondary | ICD-10-CM | POA: Diagnosis not present

## 2015-05-31 DIAGNOSIS — I5033 Acute on chronic diastolic (congestive) heart failure: Secondary | ICD-10-CM | POA: Diagnosis not present

## 2015-05-31 DIAGNOSIS — S329XXA Fracture of unspecified parts of lumbosacral spine and pelvis, initial encounter for closed fracture: Secondary | ICD-10-CM | POA: Diagnosis not present

## 2015-05-31 DIAGNOSIS — I5032 Chronic diastolic (congestive) heart failure: Secondary | ICD-10-CM | POA: Diagnosis not present

## 2015-05-31 DIAGNOSIS — I071 Rheumatic tricuspid insufficiency: Secondary | ICD-10-CM | POA: Diagnosis not present

## 2015-05-31 DIAGNOSIS — E8770 Fluid overload, unspecified: Secondary | ICD-10-CM | POA: Diagnosis not present

## 2015-05-31 DIAGNOSIS — J811 Chronic pulmonary edema: Secondary | ICD-10-CM | POA: Diagnosis not present

## 2015-05-31 DIAGNOSIS — M810 Age-related osteoporosis without current pathological fracture: Secondary | ICD-10-CM | POA: Diagnosis not present

## 2015-05-31 DIAGNOSIS — I4891 Unspecified atrial fibrillation: Secondary | ICD-10-CM | POA: Diagnosis not present

## 2015-05-31 DIAGNOSIS — M1990 Unspecified osteoarthritis, unspecified site: Secondary | ICD-10-CM | POA: Diagnosis not present

## 2015-05-31 DIAGNOSIS — Z794 Long term (current) use of insulin: Secondary | ICD-10-CM | POA: Diagnosis not present

## 2015-05-31 DIAGNOSIS — I270 Primary pulmonary hypertension: Secondary | ICD-10-CM | POA: Diagnosis not present

## 2015-05-31 LAB — COMPREHENSIVE METABOLIC PANEL
ALBUMIN: 2.3 g/dL — AB (ref 3.5–5.0)
ALT: 18 U/L (ref 14–54)
ANION GAP: 12 (ref 5–15)
AST: 26 U/L (ref 15–41)
Alkaline Phosphatase: 160 U/L — ABNORMAL HIGH (ref 38–126)
BUN: 43 mg/dL — AB (ref 6–20)
CHLORIDE: 86 mmol/L — AB (ref 101–111)
CO2: 36 mmol/L — AB (ref 22–32)
Calcium: 8.4 mg/dL — ABNORMAL LOW (ref 8.9–10.3)
Creatinine, Ser: 1.6 mg/dL — ABNORMAL HIGH (ref 0.44–1.00)
GFR calc Af Amer: 33 mL/min — ABNORMAL LOW (ref >60)
GFR calc non Af Amer: 28 mL/min — ABNORMAL LOW (ref >60)
GLUCOSE: 59 mg/dL — AB (ref 65–99)
POTASSIUM: 3.6 mmol/L (ref 3.5–5.1)
SODIUM: 134 mmol/L — AB (ref 135–145)
Total Bilirubin: 0.6 mg/dL (ref 0.3–1.2)
Total Protein: 4.9 g/dL — ABNORMAL LOW (ref 6.5–8.1)

## 2015-05-31 LAB — GLUCOSE, CAPILLARY
GLUCOSE-CAPILLARY: 107 mg/dL — AB (ref 65–99)
Glucose-Capillary: 401 mg/dL — ABNORMAL HIGH (ref 65–99)
Glucose-Capillary: 351 mg/dL — ABNORMAL HIGH (ref 65–99)

## 2015-05-31 LAB — CBC
HCT: 25.3 % — ABNORMAL LOW (ref 36.0–46.0)
Hemoglobin: 7.9 g/dL — ABNORMAL LOW (ref 12.0–15.0)
MCH: 29.6 pg (ref 26.0–34.0)
MCHC: 31.2 g/dL (ref 30.0–36.0)
MCV: 94.8 fL (ref 78.0–100.0)
Platelets: 312 10*3/uL (ref 150–400)
RBC: 2.67 MIL/uL — ABNORMAL LOW (ref 3.87–5.11)
RDW: 16.4 % — ABNORMAL HIGH (ref 11.5–15.5)
WBC: 8 10*3/uL (ref 4.0–10.5)

## 2015-05-31 LAB — PROTIME-INR
INR: 2.07 — AB (ref 0.00–1.49)
PROTHROMBIN TIME: 23.2 s — AB (ref 11.6–15.2)

## 2015-05-31 MED ORDER — FUROSEMIDE 40 MG PO TABS: 40.0000 mg | ORAL_TABLET | Freq: Three times a day (TID) | ORAL | Status: DC

## 2015-05-31 MED ORDER — INSULIN GLARGINE 100 UNIT/ML ~~LOC~~ SOLN
3.0000 [IU] | Freq: Every day | SUBCUTANEOUS | Status: DC
  Administered 2015-05-31: 3 [IU] via SUBCUTANEOUS
  Filled 2015-05-31 (×2): qty 0.03

## 2015-05-31 MED ORDER — INSULIN ASPART 100 UNIT/ML ~~LOC~~ SOLN
9.0000 [IU] | Freq: Once | SUBCUTANEOUS | Status: AC
  Administered 2015-05-31: 9 [IU] via SUBCUTANEOUS

## 2015-05-31 MED ORDER — DEXTROSE 50 % IV SOLN: 25.0000 mL | Freq: Once | INTRAVENOUS | Status: DC

## 2015-05-31 MED ORDER — WARFARIN SODIUM 5 MG PO TABS: 5.0000 mg | ORAL_TABLET | Freq: Once | ORAL | Status: DC

## 2015-05-31 MED ORDER — INSULIN GLARGINE 300 UNIT/ML ~~LOC~~ SOPN: 5.0000 [IU] | PEN_INJECTOR | Freq: Every day | SUBCUTANEOUS | Status: AC

## 2015-05-31 NOTE — Discharge Instructions (Signed)
Follow with Primary MD Wendy Grant, MD in 7 days   Get CBC, CMP, INR, 2 view Chest X ray checked in 2-3 days by SNF M.D.   Activity: As tolerated with Full fall precautions use walker/cane & assistance as needed   Disposition SNF    Diet: Heart Healthy Low Carb , with feeding assistance and aspiration precautions.  Check your Weight same time everyday, if you gain over 2 pounds, or you develop in leg swelling, experience more shortness of breath or chest pain, call your Primary MD immediately. Follow Cardiac Low Salt Diet and 1.5 lit/day fluid restriction.   On your next visit with your primary care physician please Get Medicines reviewed and adjusted.   Please request your Prim.MD to go over all Hospital Tests and Procedure/Radiological results at the follow up, please get all Hospital records sent to your Prim MD by signing hospital release before you go home.   If you experience worsening of your admission symptoms, develop shortness of breath, life threatening emergency, suicidal or homicidal thoughts you must seek medical attention immediately by calling 911 or calling your MD immediately  if symptoms less severe.  You Must read complete instructions/literature along with all the possible adverse reactions/side effects for all the Medicines you take and that have been prescribed to you. Take any new Medicines after you have completely understood and accpet all the possible adverse reactions/side effects.   Do not drive, operating heavy machinery, perform activities at heights, swimming or participation in water activities or provide baby sitting services if your were admitted for syncope or siezures until you have seen by Primary MD or a Neurologist and advised to do so again.  Do not drive when taking Pain medications.    Do not take more than prescribed Pain, Sleep and Anxiety Medications  Special Instructions: If you have smoked or chewed Tobacco  in the last 2 yrs  please stop smoking, stop any regular Alcohol  and or any Recreational drug use.  Wear Seat belts while driving.   Please note  You were cared for by a hospitalist during your hospital stay. If you have any questions about your discharge medications or the care you received while you were in the hospital after you are discharged, you can call the unit and asked to speak with the hospitalist on call if the hospitalist that took care of you is not available. Once you are discharged, your primary care physician will handle any further medical issues. Please note that NO REFILLS for any discharge medications will be authorized once you are discharged, as it is imperative that you return to your primary care physician (or establish a relationship with a primary care physician if you do not have one) for your aftercare needs so that they can reassess your need for medications and monitor your lab values.

## 2015-05-31 NOTE — Discharge Summary (Signed)
Wendy Hopkins, is a 81 y.o. female  DOB October 29, 1925  MRN [id number].  Admission date:  05/25/2015  Admitting Physician  Edwin Dada, MD  Discharge Date:  05/31/2015   Primary MD  [name], MD  Recommendations for primary care physician for things to follow:   Check CBC, BMP in 5-6 days   Admission Diagnosis  Weakness [R53.1]   Discharge Diagnosis  Weakness [R53.1]    Principal Problem:   Acute on chronic diastolic (congestive) heart failure (HCC) Active Problems:   Diabetes mellitus type 2, insulin dependent (HCC)   ANEMIA, SECONDARY TO BLOOD LOSS   Essential hypertension   Atrial fibrillation ([place])   Long term current use of anticoagulant   Severe tricuspid regurgitation   Moderate to severe pulmonary hypertension (HCC)   Fluid overload   AKI (acute kidney injury) ([place])   Pressure ulcer      Past Medical History  Diagnosis Date  . Arthritis   . Hyperlipidemia   . 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 Closed fracture of unspecified part of femur 2008  . TIA (transient ischemic attack)   . Edema   . Chronic diastolic heart failure ([place])   . Osteoporosis with fracture     compression fx lower T spine and t4, L rib  . Thyroid nodule     dominate R nodule, s/p aspiration>abn path>6 mo obs planned ([place] 06/01/11)  . GERD (gastroesophageal reflux disease)   . Hypertension 2006  . Atrial fibrillation (HCC)     chronic anticoag; and AFlutter  . History of radiation therapy 11/30/2012-12/09/2012    50 gray to right chest  . Vertebral compression fracture [facility])     Thoracic 12 and Thoracic 7  . Adenocarcinoma, lung ([place]) dx 11/08/12    s/p CT chest bx - XRT thru 12/2012  . Diabetes mellitus, type II, insulin dependent ([place])     over 40 years ago  . Small  vessel disease, cerebrovascular 05/15/2015  . Cerebral atrophy 05/15/2015  . Moderate to severe pulmonary hypertension ([place]) 05/15/2015  . Severe tricuspid regurgitation 05/15/2015    Past Surgical History  Procedure Laterality Date  . Right femur  06/25/07    Intramedullary open reduction internal fixation  . Vitrectomy  2007    right eye with cataract repair - dr. Cordelia Pen  . Tonsillectomy  1947       HPI  from the history and physical done on the day of admission:    Wendy Hopkins is a 81 y.o. female with a past medical history significant for HTN, DM, atrial fibrillation on warfarin, chronic diastolic HF, and pulmonary hypertension who presents with fluid overload.  The patient was recently admitted from October 3-5 withpubic ramus fracture nonoperatively treated for Dr. [name]. On the day of discharge the patient's creatinine went from her baseline of 1.2 to 2.7 mg/dL. She had hyperkalemia that day which was treated with Kayexalate and holding spironolactone at discharge.  In the nursing home, the patient  was continued on Kayexalate and her furosemide was stopped because there was concern that this was contributing to her renal failure. Unfortunately over the past 10 days the patient has been increasingly edematous, with swelling in her feet, belly, and hands. Today she was short of breath and somewhat altered and so she was brought to the ED.  In the ED, the patient was hyponatremic, had persistent renal failure, and a chest x-ray with pulmonary edema and pleural effusions During this hospitalization nephrology was consulted the patient was found to have hypervolemic hyponatremia and her renal function and sodium improved after aggressive IV diuresis. Nephrology anticipate the patient may be discharged tomorrow     Hospital Course:     1. Acute on chronic diastolic CHF and pulmonary hypertension:  Secondary to discontinuation of diuretics and hydration with IV fluids. She  did have an echocardiogram during her last hospitalization that showed worsening pulmonary hypertension and severe tricuspid regurgitation. EF of 60-65% -BNP-420 Patient's renal function is improving with slow diuresis, diuresis complicated by hyponatremia  Nephrology is managing his diuretics, currently on Lasix 40 x 3 times a day up from 40 twice a day along with other home medications. Much compensated with no shortness of breath. Monitor fluid intake, check weight daily. Monitor BMP closely.   2. Hypervolemic Hyponatremia:  Improved with diuresis    3. Acute renal failure: Worse due to CHF decompensation, improving with diuresis continue and monitor BMP closely. Lasix dose has been increased upon discharge. Monitor fluid intake with 1.5 L fluid restriction daily. Monitor weight, rales, BMP closely.  4. HTN:  controlled  5. A. Fib on warfarin: Chads score of greater than 4. Stable. Rate controlled. CHADS2Vasc score 4. Continue home warfarin, monitor INR in 2-3 days.  Lab Results  Component Value Date   INR 2.07* 05/31/2015   INR 1.94* 05/30/2015   INR 1.82* 05/29/2015     6. DM: Patient's son would like the patient to have only 5 units of Lantus along with sliding scale insulin. At her old age I think a liberal glycemic control can be accepted. Monitor CBGs every before meals at bedtime   CBG (last 3)   Recent Labs  05/30/15 1629 05/30/15 2101 05/31/15 0740  GLUCAP 245* 187* 107*     7. Recent multiple pelvic fractures shouldn't wants Tylenol only for pain control which will be continued, PT and supportive care.      Discharge Condition:  Stable  Follow UP  Follow-up Information    Follow up with [name], MD. Schedule an appointment as soon as possible for a visit in 1 week.   Specialty:  Internal Medicine   Contact information:   [address] [place] [address] [phone]        Consults obtained -   Renal  Diet and Activity recommendation: See Discharge Instructions below  Discharge Instructions       Discharge Instructions    Discharge instructions    Complete by:  As directed   Follow with Primary MD [name], MD in 7 days   Get CBC, CMP, INR, 2 view Chest X ray checked in 2-3 days by SNF M.D.   Activity: As tolerated with Full fall precautions use walker/cane & assistance as needed   Disposition SNF    Diet: Heart Healthy Low Carb , with feeding assistance and aspiration precautions.  Check your Weight same time everyday, if you gain over 2 pounds, or you  develop in leg swelling, experience more shortness of breath or chest pain, call your Primary MD immediately. Follow Cardiac Low Salt Diet and 1.5 lit/day fluid restriction.   On your next visit with your primary care physician please Get Medicines reviewed and adjusted.   Please request your Prim.MD to go over all Hospital Tests and Procedure/Radiological results at the follow up, please get all Hospital records sent to your Prim MD by signing hospital release before you go home.   If you experience worsening of your admission symptoms, develop shortness of breath, life threatening emergency, suicidal or homicidal thoughts you must seek medical attention immediately by calling 911 or calling your MD immediately  if symptoms less severe.  You Must read complete instructions/literature along with all the possible adverse reactions/side effects for all the Medicines you take and that have been prescribed to you. Take any new Medicines after you have completely understood and accpet all the possible adverse reactions/side effects.   Do not drive, operating heavy machinery, perform activities at heights, swimming or participation in water activities or provide baby sitting services if your were admitted for syncope or siezures until you have seen by Primary MD or a Neurologist and advised to do so again.  Do not  drive when taking Pain medications.    Do not take more than prescribed Pain, Sleep and Anxiety Medications  Special Instructions: If you have smoked or chewed Tobacco  in the last 2 yrs please stop smoking, stop any regular Alcohol  and or any Recreational drug use.  Wear Seat belts while driving.   Please note  You were cared for by a hospitalist during your hospital stay. If you have any questions about your discharge medications or the care you received while you were in the hospital after you are discharged, you can call the unit and asked to speak with the hospitalist on call if the hospitalist that took care of you is not available. Once you are discharged, your primary care physician will handle any further medical issues. Please note that NO REFILLS for any discharge medications will be authorized once you are discharged, as it is imperative that you return to your primary care physician (or establish a relationship with a primary care physician if you do not have one) for your aftercare needs so that they can reassess your need for medications and monitor your lab values.     Increase activity slowly    Complete by:  As directed              Discharge Medications       Medication List    STOP taking these medications        warfarin 2 MG tablet  Commonly known as:  COUMADIN      TAKE these medications        acetaminophen 500 MG tablet  Commonly known as:  TYLENOL  Take 500 mg by mouth every 6 (six) hours as needed for mild pain.     atorvastatin 10 MG tablet  Commonly known as:  LIPITOR  Take 10 mg by mouth daily.     bisacodyl 5 MG EC tablet  Commonly known as:  bisacodyl  Take 1 tablet (5 mg total) by mouth daily as needed for moderate constipation.     calcium carbonate 500 MG chewable tablet  Commonly known as:  TUMS - dosed in mg elemental calcium  Chew 1 tablet by mouth 2 (two) times daily.  cholecalciferol 1000 UNITS tablet  Commonly known as:   VITAMIN D  Take 1,000 Units by mouth daily.     furosemide 40 MG tablet  Commonly known as:  LASIX  Take 1 tablet (40 mg total) by mouth 3 (three) times daily.     insulin aspart 100 UNIT/ML injection  Commonly known as:  novoLOG  Inject 0-15 Units into the skin 4 (four) times daily as needed for high blood sugar. <150=0 units 151-200=4 units 201-250=6 units 251-300=8 units 301-350=10 units 351-400=12 units >400=15 units     Insulin Glargine 300 UNIT/ML Sopn  Commonly known as:  TOUJEO SOLOSTAR  Inject 5 Units into the skin daily.     ondansetron 4 MG tablet  Commonly known as:  ZOFRAN  Take 1 tablet (4 mg total) by mouth every 6 (six) hours as needed for nausea.     polyethylene glycol packet  Commonly known as:  MIRALAX / GLYCOLAX  Take 17 g by mouth daily.     promethazine 12.5 MG tablet  Commonly known as:  PHENERGAN  Take 1 tablet (12.5 mg total) by mouth every 6 (six) hours as needed for nausea.     traMADol 50 MG tablet  Commonly known as:  ULTRAM  Take 1 tablet (50 mg total) by mouth every 6 (six) hours as needed for moderate pain.        Major procedures and Radiology Reports - PLEASE review detailed and final reports for all details, in brief -      Dg Chest 1 View  05/25/2015  CLINICAL DATA:  Weakness, altered mental status EXAM: CHEST 1 VIEW COMPARISON:  CT chest 05/14/2015 FINDINGS: Bilateral small pleural effusions, right greater than left. Right basilar airspace disease similar to the prior exam. There is bilateral interstitial thickening. There is no pneumothorax. The heart and mediastinum are stable. There is thoracic aortic atherosclerosis. There is no acute osseous abnormality. There are old right posterior rib fractures. IMPRESSION: Bilateral pleural effusions and interstitial as well as alveolar airspace opacities concerning for an element of pulmonary edema versus infection. The right lower lobe airspace disease is similar to the prior CT chest dated  05/14/2015. Electronically Signed   By: Kathreen Devoid   On: 05/25/2015 02:16    Dg Chest Port 1 View  05/26/2015  CLINICAL DATA:  Short of breath. Also complaining of back pain and pelvic pain. Subsequent encounter. EXAM: PORTABLE CHEST 1 VIEW COMPARISON:  05/25/2015 FINDINGS: Bilateral pleural effusions are without change from the prior study. Interstitial thickening and mild hazy central airspace opacity noted previously has mildly improved. There are no new areas of lung opacity. Cardiac silhouette is mildly enlarged. No pneumothorax. IMPRESSION: 1. Mild improvement in presumed pulmonary edema. There are persistent pleural effusions obscuring the hemidiaphragms. No new lung abnormalities. Electronically Signed   By: Lajean Manes M.D.   On: 05/26/2015 20:29       Micro Results      Recent Results (from the past 240 hour(s))  MRSA PCR Screening     Status: None   Collection Time: 05/25/15  6:26 PM  Result Value Ref Range Status   MRSA by PCR NEGATIVE NEGATIVE Final    Comment:        The GeneXpert MRSA Assay (FDA approved for NASAL specimens only), is one component of a comprehensive MRSA colonization surveillance program. It is not intended to diagnose MRSA infection nor to guide or monitor treatment for MRSA infections.  Today   Subjective    Jerusha Reising today has no headache,no chest abdominal pain,no new weakness tingling or numbness, feels much better   Objective   Blood pressure 114/51, pulse 95, temperature 98 F (36.7 C), temperature source Oral, resp. rate 18, height '4\' 11"'$  (1.499 m), weight 54.6 kg (120 lb 5.9 oz), SpO2 100 %.   Intake/Output Summary (Last 24 hours) at 05/31/15 1036 Last data filed at 05/31/15 0500  Gross per 24 hour  Intake    480 ml  Output   1050 ml  Net   -570 ml    Exam Awake Alert, Oriented x 3, No new F.N deficits, Normal affect [place].AT,PERRAL Supple Neck,No JVD, No cervical lymphadenopathy appriciated.  Symmetrical  Chest wall movement, Good air movement bilaterally, CTAB RRR,No Gallops,Rubs or new Murmurs, No Parasternal Heave +ve B.Sounds, Abd Soft, Non tender, No organomegaly appriciated, No rebound -guarding or rigidity. No Cyanosis, Clubbing or edema, No new Rash or bruise   Data Review   CBC w Diff: Lab Results  Component Value Date   WBC 8.0 05/31/2015   WBC 6.3 12/04/2014   HGB 7.9* 05/31/2015   HCT 25.3* 05/31/2015   HCT 26.3* 05/26/2015   PLT 312 05/31/2015   LYMPHOPCT 7 05/25/2015   MONOPCT 8 05/25/2015   EOSPCT 1 05/25/2015   BASOPCT 0 05/25/2015    CMP: Lab Results  Component Value Date   NA 134* 05/31/2015   NA 130* 07/17/2013   K 3.6 05/31/2015   CL 86* 05/31/2015   CO2 36* 05/31/2015   BUN 43* 05/31/2015   BUN 16.2 11/30/2014   BUN 15 07/17/2013   CREATININE 1.60* 05/31/2015   CREATININE 1.3* 11/30/2014   CREATININE 0.8 07/17/2013   GLU 187 10/17/2013   PROT 4.9* 05/31/2015   ALBUMIN 2.3* 05/31/2015   BILITOT 0.6 05/31/2015   ALKPHOS 160* 05/31/2015   AST 26 05/31/2015   ALT 18 05/31/2015  .   Total Time in preparing paper work, data evaluation and todays exam - 35 minutes  Thurnell Lose M.D on 05/31/2015 at 10:36 AM  [facility]   Office  [phone]

## 2015-05-31 NOTE — Progress Notes (Signed)
Patient CBG was 351 at 2:20 pm, paged Dr. Candiss Norse, gave order to give 9 units of NOvolog once and to check CBG again at 5: 30 pm at the [facility] facility.  Nurse Oliver Hum) made aware of the situation and doctor's order to recheck CBG again at 5:30 pm.

## 2015-05-31 NOTE — Progress Notes (Signed)
ANTICOAGULATION CONSULT NOTE - Follow Up Consult  Pharmacy Consult for coumadin Indication: atrial fibrillation  Allergies  Allergen Reactions  . Codeine Nausea And Vomiting         Patient Measurements: Height: '4\' 11"'$  (149.9 cm) Weight: 120 lb 5.9 oz (54.6 kg) IBW/kg (Calculated) : 43.2 Heparin Dosing Weight:   Vital Signs: Temp: 98 F (36.7 C) (10/21 0600) Temp Source: Oral (10/21 0600) BP: 114/51 mmHg (10/21 0600) Pulse Rate: 95 (10/21 0600)  Labs:  Recent Labs  05/29/15 0346 05/30/15 0545 05/31/15 0315  HGB  --  8.5* 7.9*  HCT  --  25.7* 25.3*  PLT  --  309 312  LABPROT 21.0* 22.0* 23.2*  INR 1.82* 1.94* 2.07*  CREATININE 1.54* 1.61* 1.60*    Estimated Creatinine Clearance: 19 mL/min (by C-G formula based on Cr of 1.6).   Medications:  Scheduled:  . atorvastatin  10 mg Oral Daily  . enoxaparin (LOVENOX) injection  30 mg Subcutaneous Q24H  . furosemide  80 mg Oral BID  . insulin aspart  0-9 Units Subcutaneous TID WC  . insulin glargine  3 Units Subcutaneous Daily  . polyethylene glycol  17 g Oral Daily  . sodium chloride  3 mL Intravenous Q12H  . Warfarin - Pharmacist Dosing Inpatient   Does not apply q1800   Infusions:    Assessment: 81 yo female with afib is now on therapeutic coumadin.  INR today is up to 2.07 from 1.94.  Also on lovenox 30 mg  Goal of Therapy:  INR 2-3 Monitor platelets by anticoagulation protocol: Yes   Plan:  - warfarin '5mg'$  x1 tonight - d/c lovenox - Monitor daily INR, CBC, s/sx of bleeding  Roddy Bellamy, Tsz-Yin 05/31/2015,8:36 AM

## 2015-05-31 NOTE — Clinical Social Work Note (Signed)
Clinical Social Work Assessment  Patient Details  Name: Wendy Hopkins MRN: [id number] Date of Birth: [birth year]  Date of referral:  05/27/15               Reason for consult:  Facility Placement                Permission sought to share information with:  Family Supports (Daughter Wendy Hopkins was in the room with patient) Permission granted to share information::  No (Daughter was in room with patient and was allowed to talk with CSW.)  Name::     Wendy Hopkins  Agency::     Relationship::  Daughter  Contact Information:  [phone]  Housing/Transportation Living arrangements for the past 2 months:  [place] (Patient came to hospital from [place] where she was receiving ST rehab.) Source of Information:  Patient, Adult Children Patient Interpreter Needed:  None Criminal Activity/Legal Involvement Pertinent to Current Situation/Hospitalization:  No - Comment as needed Significant Relationships:  Adult Children, Other Family Members Lives with:  Facility Resident Do you feel safe going back to the place where you live?  Yes Need for family participation in patient care:  Yes (Comment)  Care giving concerns:  None expressed by patient or daughter Wendy Hopkins.   Social Worker assessment / plan:  On 05/31/15 CSW talked with patient and daughter Wendy Hopkins regarding discharge. Daughter and Patient confirmed that [name] will be returning to [place] to resume rehab. Patient was sitting in a chair at bedside and was alert, oriented and did talk with CSW, however daughter primarily interacted with CSW during conversation.  Employment status:  Retired Nurse, adult PT Recommendations:  [place] / Referral to community resources:  Other (Comment Required) (None needed or requested at this time)  Patient/Family's Response to care:  No concerns expressed.  Patient/Family's Understanding of and  Emotional Response to Diagnosis, Current Treatment, and Prognosis:  Not discussed.  Emotional Assessment Appearance:  Appears stated age Attitude/Demeanor/Rapport:  Other (Appropriate) Affect (typically observed):  Calm, Appropriate (Patient listened and made eye contact with CSW while daughter talked with CSW) Orientation:  Oriented to Self, Oriented to Place, Oriented to Situation Alcohol / Substance use:  Never Used Psych involvement (Current and /or in the community):  No (Comment)  Discharge Needs  Concerns to be addressed:  Discharge Planning Concerns Readmission within the last 30 days:  Yes Current discharge risk:  None Barriers to Discharge:  No Barriers Identified   Sable Feil, LCSW 05/31/2015, 1:41 PM

## 2015-05-31 NOTE — Clinical Social Work Note (Signed)
Patient medically stable for discharge back to [place]. Discharge information transmitted to facility and patient will be transported by ambulance. Daughter at bedside.   Jeannine Pennisi Givens, MSW, LCSW Licensed Clinical Social Worker [place] [phone]

## 2015-05-31 NOTE — Progress Notes (Signed)
Patient Discharge: Disposition: Patient discharged to [facility]. Given report to [place] Nurse at the facility and answered all her questions.   IV:  Discontinued before discharge. Telemetry: Discontinued before discharge.  CCMD notified. Transportation: Patient transported via EMS to the facility. Belongings: Patient took all her belongings with her.

## 2015-06-04 DIAGNOSIS — E119 Type 2 diabetes mellitus without complications: Secondary | ICD-10-CM | POA: Diagnosis not present

## 2015-06-04 DIAGNOSIS — L89322 Pressure ulcer of left buttock, stage 2: Secondary | ICD-10-CM | POA: Diagnosis not present

## 2015-06-04 DIAGNOSIS — S329XXA Fracture of unspecified parts of lumbosacral spine and pelvis, initial encounter for closed fracture: Secondary | ICD-10-CM | POA: Diagnosis not present

## 2015-06-04 DIAGNOSIS — I4891 Unspecified atrial fibrillation: Secondary | ICD-10-CM | POA: Diagnosis not present

## 2015-06-04 DIAGNOSIS — I270 Primary pulmonary hypertension: Secondary | ICD-10-CM | POA: Diagnosis not present

## 2015-06-04 DIAGNOSIS — I5032 Chronic diastolic (congestive) heart failure: Secondary | ICD-10-CM | POA: Diagnosis not present

## 2015-06-07 DIAGNOSIS — S32810D Multiple fractures of pelvis with stable disruption of pelvic ring, subsequent encounter for fracture with routine healing: Secondary | ICD-10-CM | POA: Diagnosis not present

## 2015-06-11 DIAGNOSIS — L89322 Pressure ulcer of left buttock, stage 2: Secondary | ICD-10-CM | POA: Diagnosis not present

## 2015-06-15 DIAGNOSIS — N390 Urinary tract infection, site not specified: Secondary | ICD-10-CM | POA: Diagnosis not present

## 2015-06-18 DIAGNOSIS — L89322 Pressure ulcer of left buttock, stage 2: Secondary | ICD-10-CM | POA: Diagnosis not present

## 2015-06-23 DIAGNOSIS — I270 Primary pulmonary hypertension: Secondary | ICD-10-CM | POA: Diagnosis not present

## 2015-06-23 DIAGNOSIS — J811 Chronic pulmonary edema: Secondary | ICD-10-CM | POA: Diagnosis not present

## 2015-07-03 DIAGNOSIS — E1040 Type 1 diabetes mellitus with diabetic neuropathy, unspecified: Secondary | ICD-10-CM | POA: Diagnosis not present

## 2015-07-03 DIAGNOSIS — M810 Age-related osteoporosis without current pathological fracture: Secondary | ICD-10-CM | POA: Diagnosis not present

## 2015-07-03 DIAGNOSIS — K3184 Gastroparesis: Secondary | ICD-10-CM | POA: Diagnosis not present

## 2015-07-03 DIAGNOSIS — Z794 Long term (current) use of insulin: Secondary | ICD-10-CM | POA: Diagnosis not present

## 2015-07-03 DIAGNOSIS — E042 Nontoxic multinodular goiter: Secondary | ICD-10-CM | POA: Diagnosis not present

## 2015-07-04 ENCOUNTER — Emergency Department (HOSPITAL_COMMUNITY): Payer: Medicare Other

## 2015-07-04 ENCOUNTER — Inpatient Hospital Stay (HOSPITAL_COMMUNITY)
Admission: EM | Admit: 2015-07-04 | Discharge: 2015-07-09 | DRG: 062 | Disposition: A | Discharge status: Skilled Nursing Facility | Payer: Medicare Other | Attending: Neurology | Admitting: Neurology

## 2015-07-04 DIAGNOSIS — I071 Rheumatic tricuspid insufficiency: Secondary | ICD-10-CM | POA: Diagnosis not present

## 2015-07-04 DIAGNOSIS — Z7901 Long term (current) use of anticoagulants: Secondary | ICD-10-CM | POA: Diagnosis not present

## 2015-07-04 DIAGNOSIS — I63031 Cerebral infarction due to thrombosis of right carotid artery: Secondary | ICD-10-CM

## 2015-07-04 DIAGNOSIS — I639 Cerebral infarction, unspecified: Secondary | ICD-10-CM

## 2015-07-04 DIAGNOSIS — I6789 Other cerebrovascular disease: Secondary | ICD-10-CM | POA: Diagnosis not present

## 2015-07-04 DIAGNOSIS — I6340 Cerebral infarction due to embolism of unspecified cerebral artery: Secondary | ICD-10-CM | POA: Diagnosis not present

## 2015-07-04 DIAGNOSIS — I5032 Chronic diastolic (congestive) heart failure: Secondary | ICD-10-CM | POA: Diagnosis not present

## 2015-07-04 DIAGNOSIS — I10 Essential (primary) hypertension: Secondary | ICD-10-CM | POA: Diagnosis not present

## 2015-07-04 DIAGNOSIS — I63411 Cerebral infarction due to embolism of right middle cerebral artery: Secondary | ICD-10-CM | POA: Diagnosis not present

## 2015-07-04 DIAGNOSIS — C3490 Malignant neoplasm of unspecified part of unspecified bronchus or lung: Secondary | ICD-10-CM

## 2015-07-04 DIAGNOSIS — G8194 Hemiplegia, unspecified affecting left nondominant side: Secondary | ICD-10-CM | POA: Diagnosis present

## 2015-07-04 DIAGNOSIS — I272 Other secondary pulmonary hypertension: Secondary | ICD-10-CM | POA: Diagnosis not present

## 2015-07-04 DIAGNOSIS — Z794 Long term (current) use of insulin: Secondary | ICD-10-CM

## 2015-07-04 DIAGNOSIS — E876 Hypokalemia: Secondary | ICD-10-CM | POA: Diagnosis not present

## 2015-07-04 DIAGNOSIS — Z8673 Personal history of transient ischemic attack (TIA), and cerebral infarction without residual deficits: Secondary | ICD-10-CM | POA: Diagnosis not present

## 2015-07-04 DIAGNOSIS — M810 Age-related osteoporosis without current pathological fracture: Secondary | ICD-10-CM | POA: Diagnosis present

## 2015-07-04 DIAGNOSIS — E1165 Type 2 diabetes mellitus with hyperglycemia: Secondary | ICD-10-CM | POA: Diagnosis not present

## 2015-07-04 DIAGNOSIS — K219 Gastro-esophageal reflux disease without esophagitis: Secondary | ICD-10-CM | POA: Diagnosis present

## 2015-07-04 DIAGNOSIS — E785 Hyperlipidemia, unspecified: Secondary | ICD-10-CM | POA: Diagnosis not present

## 2015-07-04 DIAGNOSIS — Z85118 Personal history of other malignant neoplasm of bronchus and lung: Secondary | ICD-10-CM

## 2015-07-04 DIAGNOSIS — E119 Type 2 diabetes mellitus without complications: Secondary | ICD-10-CM

## 2015-07-04 DIAGNOSIS — Z923 Personal history of irradiation: Secondary | ICD-10-CM

## 2015-07-04 DIAGNOSIS — I509 Heart failure, unspecified: Secondary | ICD-10-CM

## 2015-07-04 DIAGNOSIS — E871 Hypo-osmolality and hyponatremia: Secondary | ICD-10-CM | POA: Diagnosis not present

## 2015-07-04 DIAGNOSIS — I6309 Cerebral infarction due to thrombosis of other precerebral artery: Secondary | ICD-10-CM | POA: Diagnosis not present

## 2015-07-04 DIAGNOSIS — R29810 Facial weakness: Secondary | ICD-10-CM | POA: Diagnosis not present

## 2015-07-04 DIAGNOSIS — R531 Weakness: Secondary | ICD-10-CM | POA: Diagnosis not present

## 2015-07-04 DIAGNOSIS — I4891 Unspecified atrial fibrillation: Secondary | ICD-10-CM | POA: Diagnosis not present

## 2015-07-04 NOTE — ED Provider Notes (Signed)
CSN: [id number]     Arrival date & time 07/04/15  2342 History  By signing my name below, I, Hansel Feinstein, attest that this documentation has been prepared under the direction and in the presence of Varney Biles, MD. Electronically Signed: Hansel Feinstein, ED Scribe. 07/05/2015. 12:30 AM.    Chief Complaint  Patient presents with  . Code Stroke   The history is provided by the patient and the EMS personnel. No language interpreter was used.    LEVEL 5 CAVEAT: HPI and ROS limited due to pt condition   HPI Comments: Wendy Hopkins is a 81 y.o. female BIBA with h/o HLD, TIA, chronic diastolic heart failure, GERD, HTN, Afib, type II DM, lung adenocarcinoma, cerebral atrophy, cerebrovascular small vessel disease who presents to the Emergency Department complaining of left sided weakness onset just PTA. Per EMS, pt comes in with left sided weakness; last seen normal at 9PM. Pt reports that she tried to get up from the chair and realized that she had left sided weakness. En route, the lower extremity strength improved but the symptoms have persisted.   Past Medical History  Diagnosis Date  . Arthritis   . Hyperlipidemia   . 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 Closed fracture of unspecified part of femur 2008  . TIA (transient ischemic attack)   . Edema   . Chronic diastolic heart failure ([place])   . Osteoporosis with fracture     compression fx lower T spine and t4, L rib  . Thyroid nodule     dominate R nodule, s/p aspiration>abn path>6 mo obs planned ([facility] 06/01/11)  . GERD (gastroesophageal reflux disease)   . Hypertension 2006  . Atrial fibrillation (HCC)     chronic anticoag; and AFlutter  . History of radiation therapy 11/30/2012-12/09/2012    50 gray to right chest  . Vertebral compression fracture [facility])     Thoracic 12 and Thoracic 7  . Adenocarcinoma, lung ([place]) dx 11/08/12    s/p CT chest bx - XRT thru 12/2012  . Diabetes mellitus, type II, insulin  dependent ([place])     over 40 years ago  . Small vessel disease, cerebrovascular 05/15/2015  . Cerebral atrophy 05/15/2015  . Moderate to severe pulmonary hypertension ([place]) 05/15/2015  . Severe tricuspid regurgitation 05/15/2015   Past Surgical History  Procedure Laterality Date  . Right femur  06/25/07    Intramedullary open reduction internal fixation  . Vitrectomy  2007    right eye with cataract repair - dr. Cordelia Pen  . Tonsillectomy  1947   Family History  Problem Relation Age of Onset  . Hypertension Father     Parent not sure which 1  . Diabetes Brother   . Lung cancer Other   . Cancer Sister   . Parkinsonism Sister    Social History  Substance Use Topics  . Smoking status: Never Smoker   . Smokeless tobacco: Never Used  . Alcohol Use: No   OB History    No data available     Review of Systems  Unable to perform ROS: Acuity of condition  Neurological: Positive for weakness.  All other systems reviewed and are negative.    Allergies  Codeine  Home Medications   Prior to Admission medications   Medication Sig Start Date End Date Taking? Authorizing Provider  acetaminophen (TYLENOL) 500 MG tablet Take 500 mg by mouth every 6 (six) hours as needed for mild pain.  Historical Provider, MD  atorvastatin (LIPITOR) 10 MG tablet Take 10 mg by mouth daily.  10/23/14   Historical Provider, MD  bisacodyl (BISACODYL) 5 MG EC tablet Take 1 tablet (5 mg total) by mouth daily as needed for moderate constipation. 05/15/15   Venetia Maxon Rama, MD  calcium carbonate (TUMS - DOSED IN MG ELEMENTAL CALCIUM) 500 MG chewable tablet Chew 1 tablet by mouth 2 (two) times daily.    Historical Provider, MD  cholecalciferol (VITAMIN D) 1000 UNITS tablet Take 1,000 Units by mouth daily.     Historical Provider, MD  furosemide (LASIX) 40 MG tablet Take 1 tablet (40 mg total) by mouth 3 (three) times daily. 05/31/15   Thurnell Lose, MD  insulin aspart (NOVOLOG) 100 UNIT/ML injection Inject  0-15 Units into the skin 4 (four) times daily as needed for high blood sugar. <150=0 units 151-200=4 units 201-250=6 units 251-300=8 units 301-350=10 units 351-400=12 units >400=15 units    Historical Provider, MD  Insulin Glargine (TOUJEO SOLOSTAR) 300 UNIT/ML SOPN Inject 5 Units into the skin daily. 05/31/15   Thurnell Lose, MD  ondansetron (ZOFRAN) 4 MG tablet Take 1 tablet (4 mg total) by mouth every 6 (six) hours as needed for nausea. 05/15/15   Venetia Maxon Rama, MD  polyethylene glycol (MIRALAX / GLYCOLAX) packet Take 17 g by mouth daily. 05/15/15   Venetia Maxon Rama, MD  promethazine (PHENERGAN) 12.5 MG tablet Take 1 tablet (12.5 mg total) by mouth every 6 (six) hours as needed for nausea. 05/30/15   Reyne Dumas, MD  traMADol (ULTRAM) 50 MG tablet Take 1 tablet (50 mg total) by mouth every 6 (six) hours as needed for moderate pain. 05/30/15   Reyne Dumas, MD   BP 152/81 mmHg  Pulse 98  Temp(Src) 97.7 F (36.5 C) (Oral)  Resp 21  Ht '4\' 11"'$  (1.499 m)  Wt 103 lb 9.9 oz (47 kg)  BMI 20.92 kg/m2  SpO2 100% Physical Exam  Constitutional: She is oriented to person, place, and time. She appears well-developed and well-nourished.  HENT:  Head: Normocephalic and atraumatic.  Eyes: Conjunctivae and EOM are normal. Pupils are equal, round, and reactive to light.  Neck: Normal range of motion. Neck supple.  Cardiovascular: Normal rate.   Pulmonary/Chest: Effort normal. No respiratory distress.  Abdominal: She exhibits no distension.  Musculoskeletal: Normal range of motion.  Neurological: She is alert and oriented to person, place, and time.  LUE and LLE weakness appreciated  Skin: Skin is warm and dry.  Psychiatric: She has a normal mood and affect. Her behavior is normal.  Nursing note and vitals reviewed.   ED Course  Procedures (including critical care time) DIAGNOSTIC STUDIES: Oxygen Saturation is 99% on RA, normal by my interpretation.    COORDINATION OF CARE: 11:59 PM  Discussed treatment plan with pt at bedside and pt agreed to plan.   Labs Review Labs Reviewed  PROTIME-INR - Abnormal; Notable for the following:    Prothrombin Time 15.6 (*)    All other components within normal limits  CBC - Abnormal; Notable for the following:    RBC 3.38 (*)    Hemoglobin 10.3 (*)    HCT 31.5 (*)    RDW 16.1 (*)    All other components within normal limits  COMPREHENSIVE METABOLIC PANEL - Abnormal; Notable for the following:    Sodium 134 (*)    Chloride 97 (*)    Glucose, Bld 362 (*)    BUN 32 (*)  Creatinine, Ser 1.23 (*)    Calcium 8.7 (*)    Total Protein 6.2 (*)    Albumin 3.3 (*)    ALT 13 (*)    Alkaline Phosphatase 193 (*)    GFR calc non Af Amer 39 (*)    GFR calc Af Amer 45 (*)    All other components within normal limits  I-STAT CHEM 8, ED - Abnormal; Notable for the following:    Chloride 95 (*)    BUN 33 (*)    Creatinine, Ser 1.20 (*)    Glucose, Bld 361 (*)    Calcium, Ion 1.06 (*)    Hemoglobin 11.6 (*)    HCT 34.0 (*)    All other components within normal limits  ETHANOL  APTT  DIFFERENTIAL  URINE RAPID DRUG SCREEN, HOSP PERFORMED  URINALYSIS, ROUTINE W REFLEX MICROSCOPIC (NOT AT [facility])  I-STAT TROPOININ, ED    Imaging Review Ct Head Wo Contrast  07/05/2015  CLINICAL DATA:  Initial evaluation for acute left-sided weakness and left-sided facial droop. EXAM: CT HEAD WITHOUT CONTRAST TECHNIQUE: Contiguous axial images were obtained from the base of the skull through the vertex without intravenous contrast. COMPARISON:  Prior CT from 05/14/2015. FINDINGS: Diffuse prominence of the CSF containing spaces is compatible with generalized cerebral atrophy. Patchy and confluent hypodensity within the periventricular and deep white matter both cerebral hemispheres most consistent with chronic small vessel ischemic changes. Prominent vascular calcifications within the carotid siphons and distal vertebral arteries. No acute large vessel  territory infarct. No intracranial hemorrhage. No mass lesion, midline shift, or mass effect. No hydrocephalus. No extra-axial fluid collection. Scalp soft tissues within normal limits. No acute abnormality about the orbits. Paranasal sinuses and mastoid air cells are clear. Calvarium intact. IMPRESSION: 1. No acute intracranial process. 2. Generalized age-related cerebral atrophy with chronic microvascular ischemic disease. Critical Value/emergent results were called by telephone at the time of interpretation on 07/04/2015 at 11:57 pm to Dr. Kathrynn Speed, who verbally acknowledged these results. Electronically Signed   By: Jeannine Boga M.D.   On: 07/05/2015 00:04   I have personally reviewed and evaluated these images and lab results as part of my medical decision-making.   EKG Interpretation   Date/Time:  Friday July 05 2015 00:00:13 EST Ventricular Rate:  96 PR Interval:    QRS Duration: 96 QT Interval:  454 QTC Calculation: 574 R Axis:   143 Text Interpretation:  Atrial fibrillation Ventricular premature complex  Right axis deviation Probable anteroseptal infarct, old Prolonged QT  interval No acute changes Confirmed by Kathrynn Humble, MD, Thelma Comp [id number]) on  07/05/2015 12:33:03 AM      MDM   Final diagnoses:  Acute ischemic stroke ([place])    I personally performed the services described in this documentation, which was scribed in my presence. The recorded information has been reviewed and is accurate.   Pt comes in with acute L sided weakness. She has hx of afib, DM, HTN, TIA. CT head is neg for hemorrhage. CODE STROKE activated at arrival, as pt is well within the widow of therapy. INR came back subtherapeutic, thus Neurology team has decided to proceed with TPA. Pt and family aware of the plan. BP is WNL.  CRITICAL CARE Performed by: Varney Biles   Total critical care time: 42 minutes  Critical care time was exclusive of separately billable procedures and  treating other patients.  Critical care was necessary to treat or prevent imminent or life-threatening deterioration.  Critical care was time  spent personally by me on the following activities: development of treatment plan with patient and/or surrogate as well as nursing, discussions with consultants, evaluation of patient's response to treatment, examination of patient, obtaining history from patient or surrogate, ordering and performing treatments and interventions, ordering and review of laboratory studies, ordering and review of radiographic studies, pulse oximetry and re-evaluation of patient's condition.   Varney Biles, MD 07/05/15 [phone]

## 2015-07-05 ENCOUNTER — Inpatient Hospital Stay (HOSPITAL_COMMUNITY): Payer: Medicare Other

## 2015-07-05 ENCOUNTER — Encounter (HOSPITAL_COMMUNITY): Payer: Self-pay | Admitting: Emergency Medicine

## 2015-07-05 DIAGNOSIS — I272 Other secondary pulmonary hypertension: Secondary | ICD-10-CM | POA: Diagnosis present

## 2015-07-05 DIAGNOSIS — R1311 Dysphagia, oral phase: Secondary | ICD-10-CM | POA: Diagnosis not present

## 2015-07-05 DIAGNOSIS — I6789 Other cerebrovascular disease: Secondary | ICD-10-CM

## 2015-07-05 DIAGNOSIS — I63411 Cerebral infarction due to embolism of right middle cerebral artery: Secondary | ICD-10-CM | POA: Diagnosis not present

## 2015-07-05 DIAGNOSIS — Z923 Personal history of irradiation: Secondary | ICD-10-CM | POA: Diagnosis not present

## 2015-07-05 DIAGNOSIS — I10 Essential (primary) hypertension: Secondary | ICD-10-CM | POA: Diagnosis not present

## 2015-07-05 DIAGNOSIS — Z8673 Personal history of transient ischemic attack (TIA), and cerebral infarction without residual deficits: Secondary | ICD-10-CM | POA: Diagnosis not present

## 2015-07-05 DIAGNOSIS — R0602 Shortness of breath: Secondary | ICD-10-CM | POA: Diagnosis not present

## 2015-07-05 DIAGNOSIS — K219 Gastro-esophageal reflux disease without esophagitis: Secondary | ICD-10-CM | POA: Diagnosis present

## 2015-07-05 DIAGNOSIS — M6281 Muscle weakness (generalized): Secondary | ICD-10-CM | POA: Diagnosis not present

## 2015-07-05 DIAGNOSIS — E871 Hypo-osmolality and hyponatremia: Secondary | ICD-10-CM | POA: Diagnosis present

## 2015-07-05 DIAGNOSIS — I4891 Unspecified atrial fibrillation: Secondary | ICD-10-CM | POA: Diagnosis present

## 2015-07-05 DIAGNOSIS — Z794 Long term (current) use of insulin: Secondary | ICD-10-CM | POA: Diagnosis not present

## 2015-07-05 DIAGNOSIS — R41841 Cognitive communication deficit: Secondary | ICD-10-CM | POA: Diagnosis not present

## 2015-07-05 DIAGNOSIS — I481 Persistent atrial fibrillation: Secondary | ICD-10-CM

## 2015-07-05 DIAGNOSIS — I509 Heart failure, unspecified: Secondary | ICD-10-CM

## 2015-07-05 DIAGNOSIS — I5032 Chronic diastolic (congestive) heart failure: Secondary | ICD-10-CM | POA: Diagnosis not present

## 2015-07-05 DIAGNOSIS — I639 Cerebral infarction, unspecified: Secondary | ICD-10-CM | POA: Diagnosis not present

## 2015-07-05 DIAGNOSIS — I6523 Occlusion and stenosis of bilateral carotid arteries: Secondary | ICD-10-CM | POA: Diagnosis not present

## 2015-07-05 DIAGNOSIS — E785 Hyperlipidemia, unspecified: Secondary | ICD-10-CM | POA: Diagnosis not present

## 2015-07-05 DIAGNOSIS — I63031 Cerebral infarction due to thrombosis of right carotid artery: Secondary | ICD-10-CM | POA: Diagnosis not present

## 2015-07-05 DIAGNOSIS — M159 Polyosteoarthritis, unspecified: Secondary | ICD-10-CM | POA: Diagnosis not present

## 2015-07-05 DIAGNOSIS — E119 Type 2 diabetes mellitus without complications: Secondary | ICD-10-CM | POA: Diagnosis not present

## 2015-07-05 DIAGNOSIS — M810 Age-related osteoporosis without current pathological fracture: Secondary | ICD-10-CM | POA: Diagnosis not present

## 2015-07-05 DIAGNOSIS — Z7901 Long term (current) use of anticoagulants: Secondary | ICD-10-CM | POA: Diagnosis not present

## 2015-07-05 DIAGNOSIS — I6340 Cerebral infarction due to embolism of unspecified cerebral artery: Secondary | ICD-10-CM

## 2015-07-05 DIAGNOSIS — R278 Other lack of coordination: Secondary | ICD-10-CM | POA: Diagnosis not present

## 2015-07-05 DIAGNOSIS — D62 Acute posthemorrhagic anemia: Secondary | ICD-10-CM | POA: Diagnosis not present

## 2015-07-05 DIAGNOSIS — Z85118 Personal history of other malignant neoplasm of bronchus and lung: Secondary | ICD-10-CM | POA: Diagnosis not present

## 2015-07-05 DIAGNOSIS — I63319 Cerebral infarction due to thrombosis of unspecified middle cerebral artery: Secondary | ICD-10-CM | POA: Diagnosis not present

## 2015-07-05 DIAGNOSIS — E876 Hypokalemia: Secondary | ICD-10-CM | POA: Diagnosis present

## 2015-07-05 DIAGNOSIS — I63139 Cerebral infarction due to embolism of unspecified carotid artery: Secondary | ICD-10-CM | POA: Diagnosis not present

## 2015-07-05 DIAGNOSIS — G8194 Hemiplegia, unspecified affecting left nondominant side: Secondary | ICD-10-CM | POA: Diagnosis not present

## 2015-07-05 DIAGNOSIS — I071 Rheumatic tricuspid insufficiency: Secondary | ICD-10-CM | POA: Diagnosis present

## 2015-07-05 DIAGNOSIS — E1165 Type 2 diabetes mellitus with hyperglycemia: Secondary | ICD-10-CM | POA: Diagnosis present

## 2015-07-05 DIAGNOSIS — R531 Weakness: Secondary | ICD-10-CM | POA: Diagnosis present

## 2015-07-05 DIAGNOSIS — I6309 Cerebral infarction due to thrombosis of other precerebral artery: Secondary | ICD-10-CM | POA: Diagnosis not present

## 2015-07-05 LAB — MRSA PCR SCREENING: MRSA by PCR: NEGATIVE

## 2015-07-05 LAB — CBC
HCT: 31.5 % — ABNORMAL LOW (ref 36.0–46.0)
HEMOGLOBIN: 10.3 g/dL — AB (ref 12.0–15.0)
MCH: 30.5 pg (ref 26.0–34.0)
MCHC: 32.7 g/dL (ref 30.0–36.0)
MCV: 93.2 fL (ref 78.0–100.0)
PLATELETS: 232 10*3/uL (ref 150–400)
RBC: 3.38 MIL/uL — AB (ref 3.87–5.11)
RDW: 16.1 % — ABNORMAL HIGH (ref 11.5–15.5)
WBC: 5.6 10*3/uL (ref 4.0–10.5)

## 2015-07-05 LAB — COMPREHENSIVE METABOLIC PANEL
ALK PHOS: 193 U/L — AB (ref 38–126)
ALT: 13 U/L — AB (ref 14–54)
AST: 20 U/L (ref 15–41)
Albumin: 3.3 g/dL — ABNORMAL LOW (ref 3.5–5.0)
Anion gap: 9 (ref 5–15)
BILIRUBIN TOTAL: 0.4 mg/dL (ref 0.3–1.2)
BUN: 32 mg/dL — AB (ref 6–20)
CALCIUM: 8.7 mg/dL — AB (ref 8.9–10.3)
CO2: 28 mmol/L (ref 22–32)
CREATININE: 1.23 mg/dL — AB (ref 0.44–1.00)
Chloride: 97 mmol/L — ABNORMAL LOW (ref 101–111)
GFR, EST AFRICAN AMERICAN: 45 mL/min — AB (ref >60)
GFR, EST NON AFRICAN AMERICAN: 39 mL/min — AB (ref >60)
Glucose, Bld: 362 mg/dL — ABNORMAL HIGH (ref 65–99)
Potassium: 3.9 mmol/L (ref 3.5–5.1)
Sodium: 134 mmol/L — ABNORMAL LOW (ref 135–145)
TOTAL PROTEIN: 6.2 g/dL — AB (ref 6.5–8.1)

## 2015-07-05 LAB — LIPID PANEL
CHOL/HDL RATIO: 2.6 ratio
Cholesterol: 156 mg/dL (ref 0–200)
HDL: 61 mg/dL (ref >40)
LDL CALC: 81 mg/dL (ref 0–99)
TRIGLYCERIDES: 69 mg/dL (ref <150)
VLDL: 14 mg/dL (ref 0–40)

## 2015-07-05 LAB — GLUCOSE, CAPILLARY
Glucose-Capillary: 269 mg/dL — ABNORMAL HIGH (ref 65–99)
Glucose-Capillary: 262 mg/dL — ABNORMAL HIGH (ref 65–99)
Glucose-Capillary: 249 mg/dL — ABNORMAL HIGH (ref 65–99)
Glucose-Capillary: 301 mg/dL — ABNORMAL HIGH (ref 65–99)
Glucose-Capillary: 307 mg/dL — ABNORMAL HIGH (ref 65–99)
Glucose-Capillary: 241 mg/dL — ABNORMAL HIGH (ref 65–99)
Glucose-Capillary: 202 mg/dL — ABNORMAL HIGH (ref 65–99)
Glucose-Capillary: 133 mg/dL — ABNORMAL HIGH (ref 65–99)

## 2015-07-05 LAB — RAPID URINE DRUG SCREEN, HOSP PERFORMED
Amphetamines: NOT DETECTED
BARBITURATES: NOT DETECTED
BENZODIAZEPINES: NOT DETECTED
Cocaine: NOT DETECTED
Opiates: NOT DETECTED
Tetrahydrocannabinol: NOT DETECTED

## 2015-07-05 LAB — I-STAT CHEM 8, ED
BUN: 33 mg/dL — ABNORMAL HIGH (ref 6–20)
CALCIUM ION: 1.06 mmol/L — AB (ref 1.13–1.30)
Chloride: 95 mmol/L — ABNORMAL LOW (ref 101–111)
Creatinine, Ser: 1.2 mg/dL — ABNORMAL HIGH (ref 0.44–1.00)
Glucose, Bld: 361 mg/dL — ABNORMAL HIGH (ref 65–99)
HEMATOCRIT: 34 % — AB (ref 36.0–46.0)
Hemoglobin: 11.6 g/dL — ABNORMAL LOW (ref 12.0–15.0)
Potassium: 3.9 mmol/L (ref 3.5–5.1)
Sodium: 135 mmol/L (ref 135–145)
TCO2: 26 mmol/L (ref 0–100)

## 2015-07-05 LAB — DIFFERENTIAL
BASOS ABS: 0 10*3/uL (ref 0.0–0.1)
Basophils Relative: 0 %
EOS PCT: 1 %
Eosinophils Absolute: 0 10*3/uL (ref 0.0–0.7)
LYMPHS PCT: 20 %
Lymphs Abs: 1.1 10*3/uL (ref 0.7–4.0)
Monocytes Absolute: 0.5 10*3/uL (ref 0.1–1.0)
Monocytes Relative: 9 %
NEUTROS ABS: 3.9 10*3/uL (ref 1.7–7.7)
NEUTROS PCT: 70 %

## 2015-07-05 LAB — URINE MICROSCOPIC-ADD ON

## 2015-07-05 LAB — URINALYSIS, ROUTINE W REFLEX MICROSCOPIC
Bilirubin Urine: NEGATIVE
Glucose, UA: >1000 mg/dL — AB
HGB URINE DIPSTICK: NEGATIVE
Ketones, ur: 15 mg/dL — AB
Leukocytes, UA: NEGATIVE
NITRITE: NEGATIVE
PROTEIN: NEGATIVE mg/dL
SPECIFIC GRAVITY, URINE: 1.021 (ref 1.005–1.030)
pH: 7 (ref 5.0–8.0)

## 2015-07-05 LAB — PROTIME-INR
INR: 1.22 (ref 0.00–1.49)
Prothrombin Time: 15.6 seconds — ABNORMAL HIGH (ref 11.6–15.2)

## 2015-07-05 LAB — I-STAT TROPONIN, ED: TROPONIN I, POC: 0.01 ng/mL (ref 0.00–0.08)

## 2015-07-05 LAB — APTT: APTT: 30 s (ref 24–37)

## 2015-07-05 LAB — ETHANOL

## 2015-07-05 MED ORDER — FUROSEMIDE 80 MG PO TABS: 80.0000 mg | ORAL_TABLET | Freq: Every day | ORAL | Status: DC

## 2015-07-05 MED ORDER — FUROSEMIDE 40 MG PO TABS
40.0000 mg | ORAL_TABLET | Freq: Every day | ORAL | Status: DC
  Administered 2015-07-05 – 2015-07-09 (×5): 40 mg via ORAL
  Filled 2015-07-05 (×5): qty 1

## 2015-07-05 MED ORDER — SODIUM CHLORIDE 0.9 % IV SOLN: 50.0000 mL | Freq: Once | INTRAVENOUS | Status: DC

## 2015-07-05 MED ORDER — ATORVASTATIN CALCIUM 10 MG PO TABS
20.0000 mg | ORAL_TABLET | Freq: Every day | ORAL | Status: DC
Start: 2015-07-05 — End: 2015-07-09
  Administered 2015-07-05 – 2015-07-08 (×4): 20 mg via ORAL
  Filled 2015-07-05: qty 1
  Filled 2015-07-05 (×3): qty 2

## 2015-07-05 MED ORDER — SPIRONOLACTONE 25 MG PO TABS
25.0000 mg | ORAL_TABLET | Freq: Every day | ORAL | Status: DC
  Filled 2015-07-05: qty 1

## 2015-07-05 MED ORDER — POTASSIUM CHLORIDE CRYS ER 20 MEQ PO TBCR
40.0000 meq | EXTENDED_RELEASE_TABLET | Freq: Every day | ORAL | Status: DC
  Filled 2015-07-05: qty 2

## 2015-07-05 MED ORDER — ACETAMINOPHEN 325 MG PO TABS
650.0000 mg | ORAL_TABLET | ORAL | Status: DC | PRN
  Administered 2015-07-05 – 2015-07-09 (×10): 650 mg via ORAL
  Filled 2015-07-05 (×10): qty 2

## 2015-07-05 MED ORDER — ATORVASTATIN CALCIUM 10 MG PO TABS: 10.0000 mg | ORAL_TABLET | Freq: Every day | ORAL | Status: DC

## 2015-07-05 MED ORDER — INSULIN GLARGINE 100 UNIT/ML ~~LOC~~ SOLN
5.0000 [IU] | Freq: Every day | SUBCUTANEOUS | Status: DC
  Administered 2015-07-05 – 2015-07-09 (×5): 5 [IU] via SUBCUTANEOUS
  Filled 2015-07-05 (×6): qty 0.05

## 2015-07-05 MED ORDER — INSULIN ASPART 100 UNIT/ML ~~LOC~~ SOLN
0.0000 [IU] | Freq: Three times a day (TID) | SUBCUTANEOUS | Status: DC
  Administered 2015-07-05 (×2): 3 [IU] via SUBCUTANEOUS
  Administered 2015-07-06: 2 [IU] via SUBCUTANEOUS
  Administered 2015-07-06: 3 [IU] via SUBCUTANEOUS
  Administered 2015-07-07: 2 [IU] via SUBCUTANEOUS
  Administered 2015-07-07 – 2015-07-08 (×4): 3 [IU] via SUBCUTANEOUS

## 2015-07-05 MED ORDER — LABETALOL HCL 5 MG/ML IV SOLN: 10.0000 mg | INTRAVENOUS | Status: DC | PRN

## 2015-07-05 MED ORDER — STROKE: EARLY STAGES OF RECOVERY BOOK
Freq: Once | Status: AC
  Administered 2015-07-05: 03:00:00
  Filled 2015-07-05: qty 1

## 2015-07-05 MED ORDER — ALTEPLASE (STROKE) FULL DOSE INFUSION
0.9000 mg/kg | Freq: Once | INTRAVENOUS | Status: AC
  Administered 2015-07-05: 42 mg via INTRAVENOUS
  Filled 2015-07-05: qty 42

## 2015-07-05 MED ORDER — PANTOPRAZOLE SODIUM 40 MG IV SOLR
40.0000 mg | Freq: Every day | INTRAVENOUS | Status: DC
  Administered 2015-07-05: 40 mg via INTRAVENOUS
  Filled 2015-07-05: qty 40

## 2015-07-05 MED ORDER — ACETAMINOPHEN 650 MG RE SUPP: 650.0000 mg | RECTAL | Status: DC | PRN

## 2015-07-05 MED ORDER — INSULIN ASPART 100 UNIT/ML ~~LOC~~ SOLN: 0.0000 [IU] | SUBCUTANEOUS | Status: DC

## 2015-07-05 MED ORDER — SODIUM CHLORIDE 0.9 % IV SOLN
INTRAVENOUS | Status: DC
  Administered 2015-07-05 – 2015-07-06 (×3): via INTRAVENOUS

## 2015-07-05 NOTE — Evaluation (Signed)
Speech Language Pathology Evaluation Patient Details Name: Wendy Hopkins MRN: [id number] DOB: 10/23/1925 Today's Date: 07/05/2015 Time: 0160-1093 SLP Time Calculation (min) (ACUTE ONLY): 15 min  Problem List:  Patient Active Problem List   Diagnosis Date Noted  . CVA (cerebral infarction) 07/05/2015  . Fluid overload 05/25/2015  . Acute on chronic diastolic (congestive) heart failure ([place]) 05/25/2015  . AKI (acute kidney injury) ([place]) 05/25/2015  . Pressure ulcer 05/25/2015  . Hyperkalemia 05/15/2015  . Small vessel disease, cerebrovascular 05/15/2015  . Cerebral atrophy 05/15/2015  . Severe tricuspid regurgitation 05/15/2015  . Moderate to severe pulmonary hypertension ([place]) 05/15/2015  . Multiple pelvic fractures ([place]) 05/14/2015  . Dyspnea 01/18/2014  . Abnormal CT scan, chest 11/21/2013  . Constipation 11/08/2013  . Encounter for therapeutic drug monitoring 09/27/2013  . Vertebral compression fracture ([place])   . Cancer of lower lobe of lung ([place]) 04/21/2013  . Adenocarcinoma, lung ([place]) 11/14/2012  . Multinodular goiter (nontoxic), dominant right thyroid nodule 06/01/2011  . Osteoporosis, unspecified 02/24/2011  . GERD 10/23/2010  . Long term current use of anticoagulant 09/27/2010  . BRADYCARDIA 08/08/2009  . Atrial fibrillation ([place]) 01/22/2009  . Diabetes mellitus type 2, insulin dependent ([place]) 01/19/2009  . DYSLIPIDEMIA 01/19/2009  . ANEMIA, SECONDARY TO BLOOD LOSS 01/19/2009  . Essential hypertension 01/19/2009  . Chronic diastolic heart failure ([place]) 01/19/2009  . TRANSIENT ISCHEMIC ATTACK 01/19/2009   Past Medical History:  Past Medical History  Diagnosis Date  . Arthritis   . Hyperlipidemia   . 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 Closed fracture of unspecified part of femur 2008  . TIA (transient ischemic attack)   . Edema   . Chronic diastolic heart failure ([place])   . Osteoporosis with fracture     compression fx lower T  spine and t4, L rib  . Thyroid nodule     dominate R nodule, s/p aspiration>abn path>6 mo obs planned ([facility] 06/01/11)  . GERD (gastroesophageal reflux disease)   . Hypertension 2006  . Atrial fibrillation (HCC)     chronic anticoag; and AFlutter  . History of radiation therapy 11/30/2012-12/09/2012    50 gray to right chest  . Vertebral compression fracture [facility])     Thoracic 12 and Thoracic 7  . Adenocarcinoma, lung ([place]) dx 11/08/12    s/p CT chest bx - XRT thru 12/2012  . Diabetes mellitus, type II, insulin dependent ([place])     over 40 years ago  . Small vessel disease, cerebrovascular 05/15/2015  . Cerebral atrophy 05/15/2015  . Moderate to severe pulmonary hypertension ([place]) 05/15/2015  . Severe tricuspid regurgitation 05/15/2015   Past Surgical History:  Past Surgical History  Procedure Laterality Date  . Right femur  06/25/07    Intramedullary open reduction internal fixation  . Vitrectomy  2007    right eye with cataract repair - dr. Cordelia Pen  . Tonsillectomy  1947   HPI:  81 y.o. female BIBA with h/o HLD, TIA, chronic diastolic heart failure, GERD, HTN, Afib, type II DM, lung adenocarcinoma, cerebral atrophy, cerebrovascular small vessel disease who presents to the Emergency Department complaining of left sided weakness onset PTA.  CT negative for acute event.    Assessment / Plan / Recommendation Clinical Impression  Pt presents with no further speech difficulty.  Cognition is consistent with baseline, per daughter [name]'s confirmation at bedside.  Pt with mild chronic deficits in short term memory (storage and retrieval); decreased problem solving.  She is fully oriented,  and verbalizes unhappiness at being hospitalized so soon after her D/C from [facility].   There is a mild left CN VII asymmetry that does not interfere with speech clarity.  No further SLP needs are identified - will sign off.     SLP Assessment  Patient does not need any further Speech Lanaguage Pathology  Services    Follow Up Recommendations  None          SLP Evaluation Prior Functioning  Cognitive/Linguistic Baseline: Baseline deficits Baseline deficit details: mild memory, problem solving deficits per dtr, [name] Type of Home: House  Lives With: Spouse Available Help at Discharge: Family   Cognition  Overall Cognitive Status: History of cognitive impairments - at baseline Arousal/Alertness: Awake/alert Orientation Level: Oriented X4 Attention: Sustained Sustained Attention: Appears intact Memory: Impaired Memory Impairment: Retrieval deficit;Storage deficit Awareness: Appears intact Problem Solving: Impaired Problem Solving Impairment: Verbal basic Safety/Judgment:  (mildly impaired at baseline)    Comprehension  Auditory Comprehension Overall Auditory Comprehension: Appears within functional limits for tasks assessed Visual Recognition/Discrimination Discrimination: Within Function Limits Reading Comprehension Reading Status: Not tested    Expression Expression Primary Mode of Expression: Verbal Verbal Expression Overall Verbal Expression: Appears within functional limits for tasks assessed Written Expression Dominant Hand: Right Written Expression: Not tested   Oral / Motor Oral Motor/Sensory Function Overall Oral Motor/Sensory Function: Mild impairment Facial ROM: Reduced left Motor Speech Overall Motor Speech: Appears within functional limits for tasks assessed    [name] 07/05/2015, 9:41 AM  Estill Bamberg L. Tivis Ringer, [place] CCC/SLP Pager [phone]

## 2015-07-05 NOTE — H&P (Addendum)
Neurology Consultation Reason for Consult: Stroke Referring Physician: Kathrynn Humble, A  CC: Left sided weakness  History is obtained from:patient  HPI: Wendy Hopkins is a 81 y.o. female with a history of CHF, afib on coumadin. She was in her normal state of health when she went to lay down on a couch at 9 PM. When she went to arise at 10:45 PM, she noticed that she was having trouble standing. She did not notice the symptoms until she attempted to stand. She has been on Coumadin for atrial fibrillation and was recently in [facility] home for rehabilitation following a broken hip. She had only been home for a few days and was looking forward to celebrating her 89th birthday on Saturday.  She denies numbness. She denies visual change. She denies nausea, vomiting.  LKW: 9 pm tpa given?: yes  ROS: A 14 point ROS was performed and is negative except as noted in the HPI.   Past Medical History  Diagnosis Date  . Arthritis   . Hyperlipidemia   . 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 Closed fracture of unspecified part of femur 2008  . TIA (transient ischemic attack)   . Edema   . Chronic diastolic heart failure ([place])   . Osteoporosis with fracture     compression fx lower T spine and t4, L rib  . Thyroid nodule     dominate R nodule, s/p aspiration>abn path>6 mo obs planned ([facility] 06/01/11)  . GERD (gastroesophageal reflux disease)   . Hypertension 2006  . Atrial fibrillation (HCC)     chronic anticoag; and AFlutter  . History of radiation therapy 11/30/2012-12/09/2012    50 gray to right chest  . Vertebral compression fracture [facility])     Thoracic 12 and Thoracic 7  . Adenocarcinoma, lung ([place]) dx 11/08/12    s/p CT chest bx - XRT thru 12/2012  . Diabetes mellitus, type II, insulin dependent ([place])     over 40 years ago  . Small vessel disease, cerebrovascular 05/15/2015  . Cerebral atrophy 05/15/2015  . Moderate to severe pulmonary hypertension ([place]) 05/15/2015   . Severe tricuspid regurgitation 05/15/2015     Family History  Problem Relation Age of Onset  . Hypertension Father     Parent not sure which 1  . Diabetes Brother   . Lung cancer Other   . Cancer Sister   . Parkinsonism Sister      Social History:  reports that she has never smoked. She has never used smokeless tobacco. She reports that she does not drink alcohol or use illicit drugs.  Lasix '40mg'$  qam Atorvastatin '10mg'$  Spironolactone '25mg'$  daily Coumadin '5mg'$  daily   Exam: Current vital signs: BP 152/81 mmHg  Pulse 98  Temp(Src) 97.7 F (36.5 C) (Oral)  Resp 21  Ht '4\' 11"'$  (1.499 m)  Wt 47 kg (103 lb 9.9 oz)  BMI 20.92 kg/m2  SpO2 100% Vital signs in last 24 hours: Temp:  [97.7 F (36.5 C)] 97.7 F (36.5 C) (11/25 0009) Pulse Rate:  [94-102] 98 (11/25 0030) Resp:  [21-27] 21 (11/25 0030) BP: (146-152)/(81-91) 152/81 mmHg (11/25 0030) SpO2:  [99 %-100 %] 100 % (11/25 0030) Weight:  [47 kg (103 lb 9.9 oz)] 47 kg (103 lb 9.9 oz) (11/25 0011)   Physical Exam  Constitutional: Appears elderly Psych: Affect slightly flattened Eyes: No scleral injection HENT: No OP obstrucion Head: Normocephalic.  Cardiovascular: Normal rate and regular rhythm.  Respiratory: Effort normal and  breath sounds normal to anterior ascultation GI: Soft.  No distension. There is no tenderness.  Skin: WDI Ext: 3+ pitting edema bilaterally.   Neuro: Mental Status: Patient is awake, alert, oriented to person, place, month, year, and situation. Patient is able to give a clear and coherent history. No signs of aphasia or neglect Cranial Nerves: II: Visual Fields are full. Pupils are equal, round, and reactive to light.   III,IV, VI: EOMI without ptosis or diploplia.  V: Facial sensation is symmetric to temperature VII: Facial movement is decreased on the left VIII: hearing is intact to voice X: Uvula elevates symmetrically XI: Shoulder shrug is symmetric. XII: tongue is midline without  atrophy or fasciculations.  Motor: Tone is normal. Bulk is normal. 5/5 strength was present in the right, on the left, she has significant weakness in the left upper extremity, 4 minus/5, 3/5 in the left lower extremity Sensory: Sensation is symmetric to light touch and pinprick. She does not extinguish to double simultaneous stimulation, with the exception of one time in the leg but on repeat examination she got it correct. Cerebellar: FNF and HKS are intact on the right, discoordination out of proportion to weakness on the left    I have reviewed labs in epic and the results pertinent to this consultation are: Mildly elevated creatinine, actually down from previous Mild hypokalemia Elevated blood glucose Mild hyponatremia  I have reviewed the images obtained: CT head-no acute changes  Impression: 81 year old female presenting with left-sided weakness in the setting of subtherapeutic INR. Given that she has not are less than 1.7, I have offered TPA and discussed the risks and benefits with both patient and her power of attorney (daughter). They agree that they want to pursue therapy.  Recommendations: 1. HgbA1c, fasting lipid panel 2. MRI, MRA  of the brain without contrast 3. Frequent neuro checks 4. Echocardiogram 5. Carotid dopplers 6. Prophylactic therapy-none for 24 hours 7. Risk factor modification 8. Telemetry monitoring 9. PT consult, OT consult, Speech consult 10. Chronic CHF, will continue home spironolactone/lasix 11. Hyperlipidemia, continue home atorvastatin   This patient is critically ill and at significant risk of neurological worsening, death and care requires constant monitoring of vital signs, hemodynamics,respiratory and cardiac monitoring, neurological assessment, discussion with family, other specialists and medical decision making of high complexity. I spent 60 minutes of neurocritical care time  in the care of  this patient.  Roland Rack, MD [facility] [phone]  If 7pm- 7am, please page neurology on call as listed in [place]. 07/05/2015  1:35 AM

## 2015-07-05 NOTE — Code Documentation (Signed)
Wendy Hopkins is an 81yo wf presenting [facility] via [facility] after developing Lt side weakness & facial droop.  She sat down in her chair around 2100 tonight and at 2245 went to stand up and fell.  She called her daughter to come help her get up.  Her daughter called EMS and Wendy Hopkins was transported  to [facility].  NIH 6.  Full dose tPa was given after assessment and return of lab results.

## 2015-07-05 NOTE — Progress Notes (Signed)
Dr Erlinda Hong made aware that patient with urine retention of 787m and unable to go to bathroom. Orders given to insert foley catheter due to urine retention. Dr XErlinda Hongaware pt is only 9hours post TPA. Will continue to monitor.

## 2015-07-05 NOTE — Progress Notes (Addendum)
STROKE TEAM PROGRESS NOTE   HISTORY Wendy Hopkins is a 80 y.o. female with a history of CHF, afib on coumadin. She was in her normal state of health when she went to lay down on a couch at 9 PM. When she went to arise at 10:45 PM, she noticed that she was having trouble standing. She did not notice the symptoms until she attempted to stand. She has been on Coumadin for atrial fibrillation and was recently in [facility] for rehabilitation following a broken hip. She had only been home for a few days and was looking forward to celebrating her 89th birthday on Saturday.  She denies numbness. She denies visual change. She denies nausea, vomiting.  LKW: 9 pm tpa given?: yes   SUBJECTIVE (INTERVAL HISTORY) Her daughter is at the bedside.  Overall she feels her condition is stable. Daughter stated that pt likely not taking coumadin at [facility]. She started to give to her for the last 3 days since discharge from NH. On admission her INR 1.22.   OBJECTIVE Temp:  [97.3 F (36.3 C)-98.2 F (36.8 C)] 98.2 F (36.8 C) (11/25 0822) Pulse Rate:  [73-114] 98 (11/25 1100) Cardiac Rhythm:  [-] Atrial fibrillation (11/25 0115) Resp:  [13-31] 25 (11/25 1100) BP: (126-157)/(64-98) 133/87 mmHg (11/25 1100) SpO2:  [96 %-100 %] 98 % (11/25 1100) Weight:  [97 lb 3.6 oz (44.1 kg)-103 lb 9.9 oz (47 kg)] 97 lb 3.6 oz (44.1 kg) (11/25 0115)  CBC:   Recent Labs Lab 07/04/15 2344 07/04/15 2354  WBC 5.6  --   NEUTROABS 3.9  --   HGB 10.3* 11.6*  HCT 31.5* 34.0*  MCV 93.2  --   PLT 232  --     Basic Metabolic Panel:   Recent Labs Lab 07/04/15 2344 07/04/15 2354  NA 134* 135  K 3.9 3.9  CL 97* 95*  CO2 28  --   GLUCOSE 362* 361*  BUN 32* 33*  CREATININE 1.23* 1.20*  CALCIUM 8.7*  --     Lipid Panel:     Component Value Date/Time   CHOL 156 07/05/2015 0422   TRIG 69 07/05/2015 0422   TRIG 116 08/01/2010   HDL 61 07/05/2015 0422   CHOLHDL 2.6 07/05/2015 0422   VLDL  14 07/05/2015 0422   LDLCALC 81 07/05/2015 0422   HgbA1c:  Lab Results  Component Value Date   HGBA1C 8.9* 11/07/2014   Urine Drug Screen:     Component Value Date/Time   LABOPIA NONE DETECTED 07/05/2015 0955   COCAINSCRNUR NONE DETECTED 07/05/2015 0955   LABBENZ NONE DETECTED 07/05/2015 0955   AMPHETMU NONE DETECTED 07/05/2015 0955   THCU NONE DETECTED 07/05/2015 0955   LABBARB NONE DETECTED 07/05/2015 0955      IMAGING  Ct Head Wo Contrast 07/05/2015   1. No acute intracranial process.  2. Generalized age-related cerebral atrophy with chronic microvascular ischemic disease.   Dg Chest Port 1 View  07/05/2015  IMPRESSION: 1. Findings consistent with improving congestive heart failure. 2. Low lung volumes with persistent right right lower lobe atelectasis. Subsegmental atelectasis left base .   2D echo pending  CUS - pending  MRI and MRA pending   PHYSICAL EXAM  Temp:  [97.3 F (36.3 C)-98.2 F (36.8 C)] 98.2 F (36.8 C) (11/25 0822) Pulse Rate:  [73-114] 98 (11/25 1100) Resp:  [13-31] 25 (11/25 1100) BP: (126-157)/(64-98) 133/87 mmHg (11/25 1100) SpO2:  [96 %-100 %] 98 % (11/25 1100)  Weight:  [97 lb 3.6 oz (44.1 kg)-103 lb 9.9 oz (47 kg)] 97 lb 3.6 oz (44.1 kg) (11/25 0115)  General - Well nourished, well developed, in no apparent distress.  Ophthalmologic - Fundi not visualized due to noncooperation.  Cardiovascular - irregularly irregular heart rate and rhythm.  Mental Status -  Level of arousal and orientation to time, place, and person were intact. Language including expression, repetition, comprehension was assessed and found intact, naming 3/4, mild dysarthria. Fund of Knowledge was assessed and was impaired.  Cranial Nerves II - XII - II - Visual field intact OU. III, IV, VI - Extraocular movements intact. V - Facial sensation intact bilaterally. VII - left mild facial droop. VIII - Hearing & vestibular intact bilaterally. X - Palate elevates  symmetrically. XI - Chin turning & shoulder shrug intact bilaterally. XII - Tongue protrusion intact.  Motor Strength - The patient's strength was LUE 4/5 proximal and 5-/5 distal, LLE 4/5 proximal and 2/5 distal and pronator drift was present on the left.  5/5 RUE and RLE. Bulk was normal and fasciculations were absent.   Motor Tone - Muscle tone was assessed at the neck and appendages and was normal.  Reflexes - The patient's reflexes were 1+ in all extremities and she had no pathological reflexes.  Sensory - Light touch, temperature/pinprick were assessed and were symmetrical.    Coordination - The patient had mild left FTN dysmetria but not out of proportion to LUE weakness.  Tremor was absent.  Gait and Station - not tested due to weakness and safety concerns.   ASSESSMENT/PLAN [name] is a 81 y.o. female with history of CHF, afib on Coumadin therapy, recent pelvic fracture, hyperlipidemia, hypertension, adenocarcinoma of the lung, diabetes mellitus, severe tricuspid regurgitation, and moderate to severe pulmonary hypertension presenting with left hemiparesis. She received IV t-PA 42 mg on 07/05/2015 at [place].  Stroke:  Right brain infarct,  embolic secondary to atrial fibrillation with subtherapeutic Coumadin level. S/p tPA.  Resultant  Left hemiparesis  MRI - pending  MRA - pending  Carotid Doppler pending  2D Echo pending  LDL - 81  HgbA1c pending  VTE prophylaxis - SCDs and within tPA window Diet Carb Modified Fluid consistency:: Thin; Room service appropriate?: Yes  warfarin daily prior to admission, now on No antithrombotic secondary to within tPA window. Will consider to resume anticoagulation pending MRI brain.  Patient counseled to be compliant with her antithrombotic medications  Ongoing aggressive stroke risk factor management  Therapy recommendations: Pending  Disposition: Pending  CHF   CXR showed improving CHF  Put back on lasix  '40mg'$   Still has LE edema but no crackles in lungs  BP monitoring   Afib   Daughter reported likely that pt not taking coumadin in NH  Recent pelvis fracture but no surgery or active bleeding  Take coumadin at home for the last 3 days  INR 1.22 on admission  Will consider to resume anticoagulation pending MRI brain  Hypertension  Stable  Permissive hypertension (OK if < 180/105) but gradually normalize in 5-7 days  Hyperlipidemia  Home meds:  Lipitor 10 mg daily resumed in hospital  LDL 81, goal < 70  Increase Lipitor to 20 mg daily.  Continue statin at discharge.  Diabetes  HgbA1c pending, goal < 7.0  Uncontrolled  On lantus and humology at home  Resume lantus  SSI  Recent pelvic fracture  Conservative measure  No surgical intervention  Discharged from rehab  Able  to walk at home with walker  Other Stroke Risk Factors  Advanced age  Hx of TIA   Other Active Problems  Anemia  Renal insufficiency  INR 1.22  Hospital day # 0  This patient is critically ill due to right brain stroke s/p tPA, CHF, afib with subtherapeutic INR and at significant risk of neurological worsening, death form recurrent stroke, afib with RVR, CHF and acute heart failure. This patient's care requires constant monitoring of vital signs, hemodynamics, respiratory and cardiac monitoring, review of multiple databases, neurological assessment, discussion with family, other specialists and medical decision making of high complexity. I have long discussion with daughter about etiology of stroke, medication management and prognosis. I spent 45 minutes of neurocritical care time in the care of this patient.  Rosalin Hawking, MD PhD Stroke Neurology 07/05/2015 12:13 PM      To contact Stroke Continuity provider, please refer to [email or web address] After hours, contact General Neurology

## 2015-07-05 NOTE — Progress Notes (Signed)
Went to give daily potassium that was ordered.  Daughter questioned why potassium was ordered.  I explained that her mom was getting Lasix and though her potassium levels were in range yesterday they still ordered it.  She explained that her mom has been on potassium without lasix for years and has been fine.  One time when she took potassium it got too high and she got sick.  Dr. Erlinda Hong was called and the potassium was discontinued.

## 2015-07-05 NOTE — Progress Notes (Signed)
  Echocardiogram 2D Echocardiogram has been performed.  Wendy Hopkins 07/05/2015, 10:31 AM

## 2015-07-05 NOTE — Progress Notes (Addendum)
[facility] Progress Note Patient Name: Wendy Hopkins DOB: Sep 20, 1925 MRN: [id number]   Date of Service  07/05/2015  HPI/Events of Note  81 F presenting with left sided weakness and CT scan negative for bleed.  Received tPA.  HD stable in NAD.  Sats of 100% on RA.  On q4 hour SSI for rx of type 2 DM  eICU Interventions  Plan of care per primary team Continue to monitor via [facility]     Intervention Category Evaluation Type: New Patient Evaluation  DETERDING,ELIZABETH 07/05/2015, 2:31 AM

## 2015-07-05 NOTE — ED Notes (Signed)
Dr. Leonel Ramsay, neurologist, at the bedside and orders no foley at this time.

## 2015-07-05 NOTE — ED Notes (Signed)
Pt arrived by EMS as CODE Stroke. Pt last known well 2100, when she lay down. Pt discovered s/s at 2245 when she went to get up. Pt has left side weakness(arm and leg), facial droop, impaired speech. Per EMS pt recovered some left arm strength on way to ED. PT was AO x 4, pulse 114, bp 160/84.

## 2015-07-06 LAB — HEMOGLOBIN A1C
HEMOGLOBIN A1C: 7.6 % — AB (ref 4.8–5.6)
Mean Plasma Glucose: 171 mg/dL

## 2015-07-06 LAB — GLUCOSE, CAPILLARY
GLUCOSE-CAPILLARY: 103 mg/dL — AB (ref 65–99)
GLUCOSE-CAPILLARY: 241 mg/dL — AB (ref 65–99)
GLUCOSE-CAPILLARY: 392 mg/dL — AB (ref 65–99)
Glucose-Capillary: 129 mg/dL — ABNORMAL HIGH (ref 65–99)
Glucose-Capillary: 66 mg/dL (ref 65–99)

## 2015-07-06 MED ORDER — HEPARIN SODIUM (PORCINE) 1000 UNIT/ML IJ SOLN
5000.0000 [IU] | Freq: Three times a day (TID) | INTRAMUSCULAR | Status: DC
  Filled 2015-07-06 (×3): qty 5

## 2015-07-06 MED ORDER — SIMETHICONE 80 MG PO CHEW
80.0000 mg | CHEWABLE_TABLET | Freq: Four times a day (QID) | ORAL | Status: DC | PRN
  Administered 2015-07-06 – 2015-07-08 (×3): 80 mg via ORAL
  Filled 2015-07-06 (×3): qty 1

## 2015-07-06 MED ORDER — ASPIRIN 325 MG PO TABS
325.0000 mg | ORAL_TABLET | Freq: Every day | ORAL | Status: DC
  Administered 2015-07-06 – 2015-07-09 (×4): 325 mg via ORAL
  Filled 2015-07-06 (×4): qty 1

## 2015-07-06 MED ORDER — PANTOPRAZOLE SODIUM 40 MG PO TBEC
40.0000 mg | DELAYED_RELEASE_TABLET | Freq: Every day | ORAL | Status: DC
  Administered 2015-07-07 – 2015-07-09 (×3): 40 mg via ORAL
  Filled 2015-07-06 (×3): qty 1

## 2015-07-06 MED ORDER — HEPARIN SODIUM (PORCINE) 5000 UNIT/ML IJ SOLN
5000.0000 [IU] | Freq: Three times a day (TID) | INTRAMUSCULAR | Status: DC
  Administered 2015-07-06 – 2015-07-09 (×10): 5000 [IU] via SUBCUTANEOUS
  Filled 2015-07-06 (×9): qty 1

## 2015-07-06 NOTE — Progress Notes (Signed)
CRITICAL VALUE ALERT  Critical value received:  20  Date of notification:  07/06/2015  Time of notification:  1300  Critical value read back:No.  Nurse who received alert:  J.Karie Georges, RN  MD notified (1st page):  Dr. Irish Elders  Time of first page:  1300  MD notified (2nd page):  Time of second page:  Responding MD:  Dr. Irish Elders  Time MD responded:  [phone]

## 2015-07-06 NOTE — Progress Notes (Signed)
Rehab Admissions Coordinator Note:  Patient was screened by Cleatrice Burke for appropriateness for an Inpatient Acute Rehab Consult per PT recommendation.  At this time, we are recommending an inpt rehab consult.  Cleatrice Burke 07/06/2015, 2:15 PM  I can be reached at [phone].

## 2015-07-06 NOTE — Evaluation (Signed)
Physical Therapy Evaluation Patient Details Name: Wendy Hopkins MRN: [id number] DOB: 1926/03/10 Today's Date: 07/06/2015   History of Present Illness  pt presents with patchy multifocal Ischemic Infarcts in R MCA distribution.  pt with hx of multiple recent admissions for volume overload, falls, and pubic fxs.  pt with hx of CHF, Hip fx, HTN, DM, Pulmonary HTN, Lung CA, and servere Tricuspid Regurgitation.    Clinical Impression  Pt requiring increased A for simply transferring to chair.  Unclear pt's baseline level of cognition, but pt is not oriented to situation, but is oriented to date as today is her birthday.  At this time pt is not safe for return to home and would benefit from CIR at D/C to maximize independence and decrease burden of care.  Will continue to follow.      Follow Up Recommendations CIR    Equipment Recommendations  None recommended by PT    Recommendations for Other Services Rehab consult     Precautions / Restrictions Precautions Precautions: Fall Restrictions Weight Bearing Restrictions: No      Mobility  Bed Mobility Overal bed mobility: Needs Assistance Bed Mobility: Supine to Sit     Supine to sit: Min assist;HOB elevated     General bed mobility comments: With [facility] elevated pt able to use UEs to A with coming to EOB.  Only MinA to scoot to EOB.    Transfers Overall transfer level: Needs assistance Equipment used: 1 person hand held assist Transfers: Sit to/from [facility] Sit to Stand: Mod assist Stand pivot transfers: Mod assist       General transfer comment: pt needs cues throughout for safe technique.  pt with difficulty coordinating Bil LEs to take pivotal steps during transfer.  pt remains very flexed.    Ambulation/Gait                Stairs            Wheelchair Mobility    Modified Rankin (Stroke Patients Only) Modified Rankin (Stroke Patients Only) Pre-Morbid Rankin Score: Moderate  disability Modified Rankin: Severe disability     Balance Overall balance assessment: Needs assistance;History of Falls Sitting-balance support: Bilateral upper extremity supported;Feet supported Sitting balance-Leahy Scale: Fair     Standing balance support: During functional activity Standing balance-Leahy Scale: Poor                               Pertinent Vitals/Pain Pain Assessment: No/denies pain    Home Living Family/patient expects to be discharged to:: Inpatient rehab                      Prior Function Level of Independence: Independent with assistive device(s) (Since D/C from SNF on 11/22)         Comments: Used RW     Hand Dominance   Dominant Hand: Right    Extremity/Trunk Assessment   Upper Extremity Assessment: Defer to OT evaluation           Lower Extremity Assessment: Generalized weakness;LLE deficits/detail   LLE Deficits / Details: Strength grossly 3+/5, sensation intact.  ? decreased proprioception.    Cervical / Trunk Assessment: Kyphotic;Other exceptions  Communication   Communication: No difficulties (Slow to respond at times.)  Cognition Arousal/Alertness: Awake/alert Behavior During Therapy: Flat affect Overall Cognitive Status: No family/caregiver present to determine baseline cognitive functioning  General Comments      Exercises        Assessment/Plan    PT Assessment Patient needs continued PT services  PT Diagnosis Difficulty walking;Hemiplegia non-dominant side   PT Problem List Decreased strength;Decreased activity tolerance;Decreased balance;Decreased mobility;Decreased coordination;Decreased cognition;Decreased knowledge of use of DME;Decreased safety awareness;Impaired sensation  PT Treatment Interventions DME instruction;Gait training;Functional mobility training;Therapeutic activities;Therapeutic exercise;Balance training;Neuromuscular re-education;Cognitive  remediation;Patient/family education   PT Goals (Current goals can be found in the Care Plan section) Acute Rehab PT Goals Patient Stated Goal: Go home. PT Goal Formulation: With patient Time For Goal Achievement: 07/20/15 Potential to Achieve Goals: Good    Frequency Min 4X/week   Barriers to discharge        Co-evaluation               End of Session Equipment Utilized During Treatment: Gait belt Activity Tolerance: Patient limited by fatigue Patient left: in chair;with call bell/phone within reach;with chair alarm set Nurse Communication: Mobility status         Time: 6759-1638 PT Time Calculation (min) (ACUTE ONLY): 41 min   Charges:   PT Evaluation $Initial PT Evaluation Tier I: 1 Procedure PT Treatments $Therapeutic Activity: 23-37 mins   PT G CodesCatarina Hartshorn, [place] 07/06/2015, 12:05 PM

## 2015-07-06 NOTE — Progress Notes (Signed)
STROKE TEAM PROGRESS NOTE   HISTORY Wendy Hopkins is a 81 y.o. female with a history of CHF, afib on coumadin. She was in her normal state of health when she went to lay down on a couch at 9 PM. When she went to arise at 10:45 PM, she noticed that she was having trouble standing. She did not notice the symptoms until she attempted to stand. She has been on Coumadin for atrial fibrillation and was recently in [facility] for rehabilitation following a broken hip. She had only been home for a few days and was looking forward to celebrating her 89th birthday on Saturday.  She denies numbness. She denies visual change. She denies nausea, vomiting.  LKW: 9 pm tpa given?: yes   SUBJECTIVE (INTERVAL HISTORY) Mental status improved. Eating on her own.    OBJECTIVE Temp:  [97.3 F (36.3 C)-97.6 F (36.4 C)] 97.5 F (36.4 C) (11/26 0800) Pulse Rate:  [83-107] 100 (11/26 0900) Cardiac Rhythm:  [-] Atrial fibrillation (11/26 0800) Resp:  [17-35] 26 (11/26 0900) BP: (98-151)/(51-90) 146/82 mmHg (11/26 0900) SpO2:  [95 %-100 %] 100 % (11/26 0900)  CBC:   Recent Labs Lab 07/04/15 2344 07/04/15 2354  WBC 5.6  --   NEUTROABS 3.9  --   HGB 10.3* 11.6*  HCT 31.5* 34.0*  MCV 93.2  --   PLT 232  --     Basic Metabolic Panel:   Recent Labs Lab 07/04/15 2344 07/04/15 2354  NA 134* 135  K 3.9 3.9  CL 97* 95*  CO2 28  --   GLUCOSE 362* 361*  BUN 32* 33*  CREATININE 1.23* 1.20*  CALCIUM 8.7*  --     Lipid Panel:     Component Value Date/Time   CHOL 156 07/05/2015 0422   TRIG 69 07/05/2015 0422   TRIG 116 08/01/2010   HDL 61 07/05/2015 0422   CHOLHDL 2.6 07/05/2015 0422   VLDL 14 07/05/2015 0422   LDLCALC 81 07/05/2015 0422   HgbA1c:  Lab Results  Component Value Date   HGBA1C 7.6* 07/05/2015   Urine Drug Screen:     Component Value Date/Time   LABOPIA NONE DETECTED 07/05/2015 0955   COCAINSCRNUR NONE DETECTED 07/05/2015 0955   LABBENZ NONE DETECTED  07/05/2015 0955   AMPHETMU NONE DETECTED 07/05/2015 0955   THCU NONE DETECTED 07/05/2015 0955   LABBARB NONE DETECTED 07/05/2015 0955      IMAGING  Ct Head Wo Contrast 07/05/2015   1. No acute intracranial process.  2. Generalized age-related cerebral atrophy with chronic microvascular ischemic disease.   Dg Chest Port 1 View  07/05/2015  IMPRESSION: 1. Findings consistent with improving congestive heart failure. 2. Low lung volumes with persistent right right lower lobe atelectasis. Subsegmental atelectasis left base .   2D echo estimated ejection fraction was in the range of 60% to 65%. CUS - pending  MRI and MRA pending   PHYSICAL EXAM  Temp:  [97.3 F (36.3 C)-97.6 F (36.4 C)] 97.5 F (36.4 C) (11/26 0800) Pulse Rate:  [83-107] 100 (11/26 0900) Resp:  [17-35] 26 (11/26 0900) BP: (98-151)/(51-90) 146/82 mmHg (11/26 0900) SpO2:  [95 %-100 %] 100 % (11/26 0900)  General - Well nourished, well developed, in no apparent distress.  Ophthalmologic - Fundi not visualized due to noncooperation.  Cardiovascular - irregularly irregular heart rate and rhythm.  Mental Status -  Level of arousal and orientation to time, place, and person were intact. Language including expression, repetition,  comprehension was assessed and found intact, naming 3/4, mild dysarthria. Fund of Knowledge was assessed and was impaired.  Cranial Nerves II - XII - II - Visual field intact OU. III, IV, VI - Extraocular movements intact. V - Facial sensation intact bilaterally. VII - left mild facial droop, improved.  VIII - Hearing & vestibular intact bilaterally. X - Palate elevates symmetrically. XI - Chin turning & shoulder shrug intact bilaterally. XII - Tongue protrusion intact.  Motor Strength - The patient's strength was LUE 4/5 proximal and 5-/5 distal, LLE 4/5 proximal and 2/5 distal and pronator drift was present on the left.  5/5 RUE and RLE. Bulk was normal and fasciculations were  absent.   Motor Tone - Muscle tone was assessed at the neck and appendages and was normal.  Reflexes - The patient's reflexes were 1+ in all extremities and she had no pathological reflexes.  Sensory - Light touch, temperature/pinprick were assessed and were symmetrical.     Gait and Station - not tested due to weakness and safety concerns.   ASSESSMENT/PLAN Wendy Hopkins is a 81 y.o. female with history of CHF, afib on Coumadin therapy, recent pelvic fracture, hyperlipidemia, hypertension, adenocarcinoma of the lung, diabetes mellitus, severe tricuspid regurgitation, and moderate to severe pulmonary hypertension presenting with left hemiparesis. She received IV t-PA 42 mg on 07/05/2015 at [place].    Stroke:  Right brain infarct,  embolic secondary to atrial fibrillation with subtherapeutic Coumadin level. S/p tPA.  Resultant  Left hemiparesis MRI - Patchy multi focal ischemic infarcts involving the right basal  ganglia with extension into the right corona radiata  Carotid Doppler pending  2D Echo pending  LDL - 81  HgbA1c pending  VTE prophylaxis - SCDs, start heparin sq  DIET DYS 3 Room service appropriate?: Yes; Fluid consistency:: Thin  warfarin daily prior to admission, now on No antithrombotic secondary to within tPA window.   Restart anticoagulation with bridging on day #5 of admission  Patient counseled to be compliant with her antithrombotic medications  Ongoing aggressive stroke risk factor management  Therapy recommendations: Pending  Disposition: transfer to floor today   CHF   CXR showed improving CHF  Put back on lasix '40mg'$   Still has LE edema but no crackles in lungs  BP monitoring   Afib   Daughter reported likely that pt not taking coumadin in NH  Recent pelvis fracture but no surgery or active bleeding  Take coumadin at home for the last 3 days  INR 1.22 on admission  Anticoagulation day #5  Hypertension  Stable  Permissive  hypertension (OK if < 180/105) but gradually normalize in 5-7 days  Hyperlipidemia  Home meds:  Lipitor 10 mg daily resumed in hospital  LDL 81, goal < 70  Increase Lipitor to 20 mg daily.  Continue statin at discharge.  Diabetes  HgbA1c pending, goal < 7.0  Uncontrolled  On lantus and humology at home  Resume lantus  SSI  Recent pelvic fracture  Conservative measure  No surgical intervention  Discharged from rehab  Able to walk at home with walker  Other Stroke Risk Factors  Advanced age  Hx of TIA   Other Active Problems  Anemia  Renal insufficiency  INR 1.22  Hospital day # 1  This patient is critically ill due to right brain stroke s/p tPA, CHF, afib with subtherapeutic INR and at significant risk of neurological worsening, death form recurrent stroke, afib with RVR, CHF and acute heart failure. This  patient's care requires constant monitoring of vital signs, hemodynamics, respiratory and cardiac monitoring, review of multiple databases, neurological assessment, discussion with family, other specialists and medical decision making of high complexity. I have long discussion with daughter at bedside.  I spent 40 minutes of neurocritical care time in the care of this patient.  Rosalin Hawking, MD PhD Stroke Neurology 07/06/2015 10:01 AM      To contact Stroke Continuity provider, please refer to [email or web address] After hours, contact General Neurology

## 2015-07-06 NOTE — Progress Notes (Signed)
Patient transferred from [facility] [place]. Patient alert and oriented. Patient oriented to room.

## 2015-07-06 NOTE — Progress Notes (Signed)
PT Cancellation Note  Patient Details Name: Wendy Hopkins MRN: [id number] DOB: 26-Apr-1926   Cancelled Treatment:    Reason Eval/Treat Not Completed: Patient not medically ready.  Pt currently on bedrest.  Please advance activity order once appropriate for PT and mobility.     Javin Nong, Thornton Papas 07/06/2015, 8:06 AM

## 2015-07-07 ENCOUNTER — Inpatient Hospital Stay (HOSPITAL_COMMUNITY): Payer: Medicare Other

## 2015-07-07 DIAGNOSIS — I639 Cerebral infarction, unspecified: Secondary | ICD-10-CM

## 2015-07-07 LAB — GLUCOSE, CAPILLARY
GLUCOSE-CAPILLARY: 254 mg/dL — AB (ref 65–99)
GLUCOSE-CAPILLARY: 286 mg/dL — AB (ref 65–99)
Glucose-Capillary: 212 mg/dL — ABNORMAL HIGH (ref 65–99)
Glucose-Capillary: 176 mg/dL — ABNORMAL HIGH (ref 65–99)

## 2015-07-07 NOTE — Progress Notes (Signed)
STROKE TEAM PROGRESS NOTE   HISTORY Wendy Hopkins is a 81 y.o. female with a history of CHF, afib on coumadin. She was in her normal state of health when she went to lay down on a couch at 9 PM. When she went to arise at 10:45 PM, she noticed that she was having trouble standing. She did not notice the symptoms until she attempted to stand. She has been on Coumadin for atrial fibrillation and was recently in [facility] home for rehabilitation following a broken hip. She had only been home for a few days and was looking forward to celebrating her 81th birthday on Saturday.  She denies numbness. She denies visual change. She denies nausea, vomiting.  LKW: 9 pm tpa given?: yes   SUBJECTIVE (INTERVAL HISTORY) Daughter at bedside, patient improving. Pending inpatient rehab.    OBJECTIVE Temp:  [97.7 F (36.5 C)-97.9 F (36.6 C)] 97.8 F (36.6 C) (11/27 0955) Pulse Rate:  [82-101] 82 (11/27 0955) Cardiac Rhythm:  [-]  Resp:  [17-20] 18 (11/27 0955) BP: (124-156)/(69-88) 127/71 mmHg (11/27 0955) SpO2:  [97 %-99 %] 98 % (11/27 0955)  CBC:   Recent Labs Lab 07/04/15 2344 07/04/15 2354  WBC 5.6  --   NEUTROABS 3.9  --   HGB 10.3* 11.6*  HCT 31.5* 34.0*  MCV 93.2  --   PLT 232  --     Basic Metabolic Panel:   Recent Labs Lab 07/04/15 2344 07/04/15 2354  NA 134* 135  K 3.9 3.9  CL 97* 95*  CO2 28  --   GLUCOSE 362* 361*  BUN 32* 33*  CREATININE 1.23* 1.20*  CALCIUM 8.7*  --     Lipid Panel:     Component Value Date/Time   CHOL 156 07/05/2015 0422   TRIG 69 07/05/2015 0422   TRIG 116 08/01/2010   HDL 61 07/05/2015 0422   CHOLHDL 2.6 07/05/2015 0422   VLDL 14 07/05/2015 0422   LDLCALC 81 07/05/2015 0422   HgbA1c:  Lab Results  Component Value Date   HGBA1C 7.6* 07/05/2015   Urine Drug Screen:     Component Value Date/Time   LABOPIA NONE DETECTED 07/05/2015 0955   COCAINSCRNUR NONE DETECTED 07/05/2015 0955   LABBENZ NONE DETECTED 07/05/2015 0955    AMPHETMU NONE DETECTED 07/05/2015 0955   THCU NONE DETECTED 07/05/2015 0955   LABBARB NONE DETECTED 07/05/2015 0955      IMAGING  Ct Head Wo Contrast 07/05/2015   1. No acute intracranial process.  2. Generalized age-related cerebral atrophy with chronic microvascular ischemic disease.   Dg Chest Port 1 View  07/05/2015  IMPRESSION: 1. Findings consistent with improving congestive heart failure. 2. Low lung volumes with persistent right right lower lobe atelectasis. Subsegmental atelectasis left base .   2D echo estimated ejection fraction was in the range of 60% to 65%. CUS - pending  MRI and MRA  07/06/2015  MRI HEAD  1. Patchy multi focal ischemic infarcts involving the right basal ganglia with extension into the right corona radiata, with additional involvement of the right insular cortex and overlying gray matter of the right frontoparietal and temporal regions. Right MCA distribution. Associated petechial hemorrhage without hemorrhagic transformation. 2. Age-related cerebral atrophy with mild chronic small vessel ischemic disease.  MRA HEAD  1. No large or proximal arterial branch occlusion. Possible distal right right M2 occlusion, superior division. 2. No high-grade or correctable stenosis. 3. Probable distal atheromatous disease involving the MCA branches bilaterally, right worse  than left.   PHYSICAL EXAM  Temp:  [97.7 F (36.5 C)-97.9 F (36.6 C)] 97.8 F (36.6 C) (11/27 0955) Pulse Rate:  [82-101] 82 (11/27 0955) Resp:  [17-20] 18 (11/27 0955) BP: (124-156)/(69-88) 127/71 mmHg (11/27 0955) SpO2:  [97 %-99 %] 98 % (11/27 0955)  General - Well nourished, well developed, in no apparent distress.  Ophthalmologic - Fundi not visualized due to noncooperation.  Cardiovascular - irregularly irregular heart rate and rhythm.  Mental Status -  Level of arousal and orientation to time, place, and person were intact. Language including expression, repetition,  comprehension was assessed and found intact, naming 3/4, mild dysarthria. Fund of Knowledge was assessed and was impaired.  Cranial Nerves II - XII - II - Visual field intact OU. III, IV, VI - Extraocular movements intact. V - Facial sensation intact bilaterally. VII - left mild facial droop, improved.  VIII - Hearing & vestibular intact bilaterally. X - Palate elevates symmetrically. XI - Chin turning & shoulder shrug intact bilaterally. XII - Tongue protrusion intact.  Motor Strength - The patient's strength was LUE 4/5 proximal and 5-/5 distal, LLE 4/5 proximal and 2/5 distal and pronator drift was present on the left.  5/5 RUE and RLE. Bulk was normal and fasciculations were absent.   Motor Tone - Muscle tone was assessed at the neck and appendages and was normal.  Reflexes - The patient's reflexes were 1+ in all extremities and she had no pathological reflexes.  Sensory - Light touch, temperature/pinprick were assessed and were symmetrical.    Cerebellar - Finger to nose with difficulty LUE due to weakness.   Gait and Station - not tested due to weakness and safety concerns.   ASSESSMENT/PLAN Wendy Hopkins is a 81 y.o. female with history of CHF, afib on Coumadin therapy, recent pelvic fracture, hyperlipidemia, hypertension, adenocarcinoma of the lung, diabetes mellitus, severe tricuspid regurgitation, and moderate to severe pulmonary hypertension presenting with left hemiparesis. She received IV t-PA 42 mg on 07/05/2015 at [facility].    Stroke:  Right brain infarct,  embolic secondary to atrial fibrillation with subtherapeutic Coumadin level. S/p tPA.  Resultant  Left hemiparesis  MRI - Patchy multi focal ischemic infarcts  MRA - Possible distal right right M2 occlusion, superior division.  Carotid Doppler 1-39% ICA plaquing. Vertebral artery flow is antegrade.  2D Echo - EF 60-65%. No cardiac source of emboli identified.  LDL - 81  HgbA1c 7.6  VTE prophylaxis -  SCDs, start heparin sq  DIET DYS 3 Room service appropriate?: Yes; Fluid consistency:: Thin  warfarin daily prior to admission, now on Aspirin 325 mg daily.  Restart anticoagulation with bridging on day #5 of admission  Patient counseled to be compliant with her antithrombotic medications  Ongoing aggressive stroke risk factor management  Therapy recommendations: Inpatient rehabilitation recommended.  Disposition: transfer to floor today   CHF   CXR showed improving CHF  Put back on lasix '40mg'$   Still has LE edema but no crackles in lungs  BP monitoring   Afib   Daughter reported likely that pt not taking coumadin in NH  Recent pelvis fracture but no surgery or active bleeding  Take coumadin at home for the last 3 days  INR 1.22 on admission  Start anticoagulation day #5  Hypertension  Stable Permissive hypertension (OK if < 180/105) but gradually normalize in 5-7 days  Hyperlipidemia  Home meds:  Lipitor 10 mg daily resumed in hospital  LDL 81, goal < 70  Increase Lipitor to 20 mg daily.  Continue statin at discharge.  Diabetes  HgbA1c 7.6, goal < 7.0  Uncontrolled  On lantus and humology at home  Resume lantus  SSI  Recent pelvic fracture  Conservative measure  No surgical intervention  Discharged from rehab  Able to walk at home with walker  Other Stroke Risk Factors  Advanced age  Hx of TIA   Other Active Problems  Anemia  Renal insufficiency   Hospital day # 2    Personally examined patient and images, personally conducted neuro exam,documented assessment and plan as stated above. I have personally obtained the history, evaluated lab data, reviewed imaging studies and agree with radiology interpretations.    Sarina Ill, MD Stroke Neurology [phone] [facility]   To contact Stroke Continuity provider, please refer to [email or web address] After hours, contact General Neurology

## 2015-07-07 NOTE — Evaluation (Signed)
Occupational Therapy Evaluation Patient Details Name: Wendy Hopkins MRN: [id number] DOB: 1926/01/23 Today's Date: 07/07/2015    History of Present Illness pt presents with patchy multifocal Ischemic Infarcts in R MCA distribution.  pt with hx of multiple recent admissions for volume overload, falls, and pubic fxs.  pt with hx of CHF, Hip fx, HTN, DM, Pulmonary HTN, Lung CA, and servere Tricuspid Regurgitation.     Clinical Impression   Patient presenting with decreased ADL and functional mobility independence secondary to above. Patient mod I to supervision since recent discharge from [facility] on 11/22. Patient currently functioning at an overall mod to max assist level. Patient will benefit from acute OT to increase overall independence in the areas of ADLs, functional mobility, and overall safety in order to safely discharge to venue listed below.     Follow Up Recommendations  CIR;Supervision/Assistance - 24 hour    Equipment Recommendations  Other (comment) (TBD)    Recommendations for Other Services  None at this time   Precautions / Restrictions Precautions Precautions: Fall Restrictions Weight Bearing Restrictions: No    Mobility Bed Mobility Overal bed mobility: Needs Assistance Bed Mobility: Supine to Sit     Supine to sit: Min assist;HOB elevated     General bed mobility comments: With [facility] elevated pt able to use UEs to A with coming to EOB.  Only MinA to scoot to EOB.    Transfers Overall transfer level: Needs assistance Equipment used: 1 person hand held assist Transfers: Sit to/from [facility] Sit to Stand: Mod assist Stand pivot transfers: Mod assist       General transfer comment: pt needs cues throughout for safe technique.  pt with difficulty coordinating Bil LEs to take pivotal steps during transfer.  pt remains very flexed.      Balance Overall balance assessment: Needs assistance;History of Falls Sitting-balance support: Feet  supported;Bilateral upper extremity supported Sitting balance-Leahy Scale: Fair     Standing balance support: During functional activity Standing balance-Leahy Scale: Poor    ADL Overall ADL's : Needs assistance/impaired Eating/Feeding: Set up;Sitting   Grooming: Set up;Sitting   Upper Body Bathing: Minimal assitance;Sitting   Lower Body Bathing: Sit to/from stand;Maximal assistance   Upper Body Dressing : Minimal assistance;Sitting   Lower Body Dressing: Maximal assistance;Sit to/from stand   Toilet Transfer: Moderate assistance;Stand-pivot;BSC Functional mobility during ADLs: Moderate assistance;Rolling walker;Cueing for safety      Vision Vision Assessment?: No apparent visual deficits          Pertinent Vitals/Pain Pain Assessment: No/denies pain     Hand Dominance Right   Extremity/Trunk Assessment Upper Extremity Assessment Upper Extremity Assessment: LUE deficits/detail;Generalized weakness LUE Deficits / Details: strength grossly 3/5 LUE Coordination: decreased fine motor;decreased gross motor       Cervical / Trunk Assessment Cervical / Trunk Assessment: Kyphotic;Other exceptions Cervical / Trunk Exceptions: Scoliosis noted, but not listed in PMH.   Communication Communication Communication: No difficulties   Cognition Arousal/Alertness: Awake/alert Behavior During Therapy: Flat affect Overall Cognitive Status: No family/caregiver present to determine baseline cognitive functioning              Home Living Family/patient expects to be discharged to:: Unsure Living Arrangements: Spouse/significant other Available Help at Discharge: Family Type of Home: House Home Access: Ramped entrance     Home Layout: One level     Bathroom Shower/Tub: Walk-in shower;Door   Bathroom Toilet: Handicapped height     Home Equipment: Environmental consultant - 2 wheels;Bedside commode;Cane - single  point;Shower seat   Additional Comments: Pt at [facility] middle of october  2016-middle of november 2016. Pt home for 2 days and pt readmitted into hospital  Lives With: Spouse    Prior Functioning/Environment Level of Independence: Independent with assistive device(s) (since d/c from snf on 11/22)        Comments: Used RW    OT Diagnosis: Generalized weakness;Hemiplegia non-dominant side   OT Problem List: Decreased strength;Decreased range of motion;Decreased activity tolerance;Impaired balance (sitting and/or standing);Decreased safety awareness;Decreased knowledge of use of DME or AE;Decreased knowledge of precautions;Impaired UE functional use   OT Treatment/Interventions: Self-care/ADL training;Therapeutic exercise;Energy conservation;DME and/or AE instruction;Therapeutic activities;Patient/family education;Balance training    OT Goals(Current goals can be found in the care plan section) Acute Rehab OT Goals Patient Stated Goal: Go home. OT Goal Formulation: With patient Time For Goal Achievement: 07/21/15 Potential to Achieve Goals: Good ADL Goals Pt Will Perform Grooming: with supervision;standing Pt Will Perform Lower Body Bathing: with supervision;sit to/from stand;with adaptive equipment Pt Will Perform Lower Body Dressing: with supervision;with adaptive equipment;sit to/from stand Pt Will Transfer to Toilet: ambulating;bedside commode;with min guard assist Additional ADL Goal #1: Pt will be min guard for functional ambulation using RW prn  OT Frequency: Min 2X/week   Barriers to D/C: none known at this time   End of Session Equipment Utilized During Treatment: Gait belt Nurse Communication: Mobility status  Activity Tolerance: Patient tolerated treatment well Patient left: in chair;with call bell/phone within reach   Time: 1145-1206 OT Time Calculation (min): 21 min Charges:  OT General Charges $OT Visit: 1 Procedure OT Evaluation $Initial OT Evaluation Tier I: 1 Procedure  Kaaliyah Kita , MS, OTR/L, CLT Pager: [phone]  07/07/2015,  12:33 PM

## 2015-07-07 NOTE — Progress Notes (Signed)
VASCULAR LAB PRELIMINARY  PRELIMINARY  PRELIMINARY  PRELIMINARY  Carotid duplex completed.    Preliminary report:  1-39% ICA plaquing.  Vertebral artery flow is antegrade.   Jaselyn Nahm, RVT 07/07/2015, 3:07 PM

## 2015-07-08 DIAGNOSIS — I4891 Unspecified atrial fibrillation: Secondary | ICD-10-CM

## 2015-07-08 DIAGNOSIS — E1310 Other specified diabetes mellitus with ketoacidosis without coma: Secondary | ICD-10-CM

## 2015-07-08 DIAGNOSIS — D62 Acute posthemorrhagic anemia: Secondary | ICD-10-CM

## 2015-07-08 DIAGNOSIS — I63319 Cerebral infarction due to thrombosis of unspecified middle cerebral artery: Secondary | ICD-10-CM

## 2015-07-08 DIAGNOSIS — I63031 Cerebral infarction due to thrombosis of right carotid artery: Secondary | ICD-10-CM

## 2015-07-08 DIAGNOSIS — I5032 Chronic diastolic (congestive) heart failure: Secondary | ICD-10-CM

## 2015-07-08 DIAGNOSIS — I10 Essential (primary) hypertension: Secondary | ICD-10-CM

## 2015-07-08 DIAGNOSIS — M159 Polyosteoarthritis, unspecified: Secondary | ICD-10-CM

## 2015-07-08 DIAGNOSIS — I272 Other secondary pulmonary hypertension: Secondary | ICD-10-CM

## 2015-07-08 LAB — GLUCOSE, CAPILLARY
GLUCOSE-CAPILLARY: 247 mg/dL — AB (ref 65–99)
GLUCOSE-CAPILLARY: 183 mg/dL — AB (ref 65–99)
GLUCOSE-CAPILLARY: 200 mg/dL — AB (ref 65–99)
Glucose-Capillary: 257 mg/dL — ABNORMAL HIGH (ref 65–99)

## 2015-07-08 MED ORDER — WARFARIN - PHARMACIST DOSING INPATIENT
Freq: Every day | Status: DC
Start: 2015-07-08 — End: 2015-07-09
  Administered 2015-07-08: 17:00:00

## 2015-07-08 MED ORDER — INSULIN ASPART 100 UNIT/ML ~~LOC~~ SOLN: 0.0000 [IU] | Freq: Every day | SUBCUTANEOUS | Status: DC

## 2015-07-08 MED ORDER — WARFARIN SODIUM 5 MG PO TABS
5.0000 mg | ORAL_TABLET | Freq: Once | ORAL | Status: AC
  Administered 2015-07-08: 5 mg via ORAL
  Filled 2015-07-08: qty 1

## 2015-07-08 MED ORDER — INSULIN ASPART 100 UNIT/ML ~~LOC~~ SOLN
0.0000 [IU] | Freq: Three times a day (TID) | SUBCUTANEOUS | Status: DC
  Administered 2015-07-08: 2 [IU] via SUBCUTANEOUS
  Administered 2015-07-08: 3 [IU] via SUBCUTANEOUS
  Administered 2015-07-09: 1 [IU] via SUBCUTANEOUS
  Administered 2015-07-09: 5 [IU] via SUBCUTANEOUS

## 2015-07-08 NOTE — Progress Notes (Signed)
ANTICOAGULATION CONSULT NOTE - Initial Consult  Pharmacy Consult for Coumadin Indication: atrial fibrillation  Allergies  Allergen Reactions  . Codeine Nausea And Vomiting         Patient Measurements: Height: '4\' 11"'$  (149.9 cm) Weight: 97 lb 3.6 oz (44.1 kg) IBW/kg (Calculated) : 43.2  Vital Signs: Temp: 98.2 F (36.8 C) (11/28 0946) Temp Source: Oral (11/28 0946) BP: 142/79 mmHg (11/28 0946) Pulse Rate: 89 (11/28 0946)  Labs: No results for input(s): HGB, HCT, PLT, APTT, LABPROT, INR, HEPARINUNFRC, CREATININE, CKTOTAL, CKMB, TROPONINI in the last 72 hours.  Estimated Creatinine Clearance: 22.5 mL/min (by C-G formula based on Cr of 1.2).   Medical History: Past Medical History  Diagnosis Date  . Arthritis   . Hyperlipidemia   . 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 Closed fracture of unspecified part of femur 2008  . TIA (transient ischemic attack)   . Edema   . Chronic diastolic heart failure ([place])   . Osteoporosis with fracture     compression fx lower T spine and t4, L rib  . Thyroid nodule     dominate R nodule, s/p aspiration>abn path>6 mo obs planned ([facility] 06/01/11)  . GERD (gastroesophageal reflux disease)   . Hypertension 2006  . Atrial fibrillation ([facility])     chronic anticoag; and AFlutter  . History of radiation therapy 11/30/2012-12/09/2012    50 gray to right chest  . Vertebral compression fracture [facility])     Thoracic 12 and Thoracic 7  . Adenocarcinoma, lung ([place]) dx 11/08/12    s/p CT chest bx - XRT thru 12/2012  . Diabetes mellitus, type II, insulin dependent ([place])     over 40 years ago  . Small vessel disease, cerebrovascular 05/15/2015  . Cerebral atrophy 05/15/2015  . Moderate to severe pulmonary hypertension ([place]) 05/15/2015  . Severe tricuspid regurgitation 05/15/2015    Assessment: 81 year old female restarting Coumadin today s/p TPA for CVA INR low on admission on 5 mg daily except 2.5 mg on Friday  Goal of  Therapy:  INR 2-3 Monitor platelets by anticoagulation protocol: Yes   Plan:  Coumadin 5 mg po x 1 today Daily INR  Thank you Anette Guarneri, PharmD [phone]  07/08/2015,12:48 PM

## 2015-07-08 NOTE — Progress Notes (Signed)
Inpatient Diabetes Program Recommendations  AACE/ADA: New Consensus Statement on Inpatient Glycemic Control (2015)  Target Ranges:  Prepandial:   less than 140 mg/dL      Peak postprandial:   less than 180 mg/dL (1-2 hours)      Critically ill patients:  140 - 180 mg/dL    Results for Wendy Hopkins, Wendy Hopkins (MRN [id number]) as of 07/08/2015 09:29  Ref. Range 07/07/2015 06:17 07/07/2015 11:05 07/07/2015 16:22 07/07/2015 21:28 07/08/2015 07:08  Glucose-Capillary Latest Ref Range: 65-99 mg/dL 212 (H) 176 (H) 254 (H) 286 (H) 257 (H)   Review of Glycemic Control  Diabetes history: DM 2 Outpatient Diabetes medications: Toujeo 5 units, Novolog 0-4 units 4x/day Current orders for Inpatient glycemic control: Lantus 5 units, Novolog 0-3 units TID.  Inpatient Diabetes Program Recommendations: Correction (SSI): Glucose in the 200's. Please consider ordering the regular Novolog Sensitive correction scale while inpatient TID and ordering HS scale.  Thanks,  Tama Headings RN, MSN, [facility] Inpatient Diabetes Coordinator Team Pager [phone] (8a-5p)

## 2015-07-08 NOTE — Progress Notes (Signed)
Rehab admissions - Please see rehab consult from Dr. Posey Pronto recommending SNF placement.  Call me for questions.  #[phone]

## 2015-07-08 NOTE — Consult Note (Addendum)
Physical Medicine and Rehabilitation Consult Reason for Consult: Patchy multifocal ischemic infarcts right basal ganglia with extension into the right corona radiata, additional involvement of the right insular cortex Referring Physician: Dr.Xu   HPI: Wendy Hopkins is a 81 y.o. right handed female with history of hypertension, diabetes mellitus, diastolic congestive heart failure, atrial fibrillation with chronic Coumadin. Patient lives with her husband who has limited mobility. One level home with a ramp. Patient using walker prior to admission. Patient recently at [facility] facility after after hip fracture 05/13/2015 had been home for 2 days using a walker and developed left sided weakness 07/05/2015. INR on admission of 1.22. MRI of the brain showed patchy multifocal ischemic infarcts right basal ganglia with extension into the right corona radiata with additional involvement of the right insular cortex and overlying gray matter of the right frontoparietal and temporal regions. Right MCA distribution. MRA of the head without occlusion or stenosis. Echocardiogram with ejection fraction of 65% no wall motion abnormalities. Carotid Dopplers and no ICA stenosis. Neurology consulted presently on aspirin 325 mg daily as well as subcutaneous heparin for DVT prophylaxis and await plan to resume chronic Coumadin therapy. Mechanical soft diet. Physical and occupational therapy evaluations completed 07/06/2015 with recommendations of physical medicine rehabilitation consult.   Review of Systems  Constitutional: Negative for fever and chills.  HENT: Negative for hearing loss.   Eyes: Negative for blurred vision and double vision.  Respiratory: Negative for cough and shortness of breath.   Cardiovascular: Positive for leg swelling. Negative for chest pain.  Gastrointestinal: Positive for nausea, abdominal pain and constipation. Negative for vomiting.       GERD  Genitourinary:  Negative for dysuria, urgency and hematuria.  Musculoskeletal: Positive for myalgias, back pain, joint pain and falls.  Skin: Negative for rash.  Neurological: Negative for seizures, loss of consciousness and headaches.  All other systems reviewed and are negative.  Past Medical History  Diagnosis Date  . Arthritis   . Hyperlipidemia   . 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 Closed fracture of unspecified part of femur 2008  . TIA (transient ischemic attack)   . Edema   . Chronic diastolic heart failure ([place])   . Osteoporosis with fracture     compression fx lower T spine and t4, L rib  . Thyroid nodule     dominate R nodule, s/p aspiration>abn path>6 mo obs planned ([facility] 06/01/11)  . GERD (gastroesophageal reflux disease)   . Hypertension 2006  . Atrial fibrillation ([facility])     chronic anticoag; and AFlutter  . History of radiation therapy 11/30/2012-12/09/2012    50 gray to right chest  . Vertebral compression fracture [facility])     Thoracic 12 and Thoracic 7  . Adenocarcinoma, lung ([place]) dx 11/08/12    s/p CT chest bx - XRT thru 12/2012  . Diabetes mellitus, type II, insulin dependent ([place])     over 40 years ago  . Small vessel disease, cerebrovascular 05/15/2015  . Cerebral atrophy 05/15/2015  . Moderate to severe pulmonary hypertension ([place]) 05/15/2015  . Severe tricuspid regurgitation 05/15/2015   Past Surgical History  Procedure Laterality Date  . Right femur  06/25/07    Intramedullary open reduction internal fixation  . Vitrectomy  2007    right eye with cataract repair - dr. Cordelia Pen  . Tonsillectomy  1947   Family History  Problem Relation Age of Onset  . Hypertension Father  Parent not sure which 1  . Diabetes Brother   . Lung cancer Other   . Cancer Sister   . Parkinsonism Sister    Social History:  reports that she has never smoked. She has never used smokeless tobacco. She reports that she does not drink alcohol or use illicit  drugs. Allergies:  Allergies  Allergen Reactions  . Codeine Nausea And Vomiting        Medications Prior to Admission  Medication Sig Dispense Refill  . acetaminophen (TYLENOL) 500 MG tablet Take 500 mg by mouth every 6 (six) hours as needed for mild pain.     Marland Kitchen atorvastatin (LIPITOR) 10 MG tablet Take 10 mg by mouth daily.   5  . cholecalciferol (VITAMIN D) 1000 UNITS tablet Take 1,000 Units by mouth daily.     . furosemide (LASIX) 40 MG tablet Take 1 tablet (40 mg total) by mouth 3 (three) times daily. (Patient taking differently: Take 40 mg by mouth daily. ) 60 tablet 1  . insulin aspart (NOVOLOG) 100 UNIT/ML injection Inject 0-4 Units into the skin 4 (four) times daily as needed for high blood sugar. Sliding scale    . Insulin Glargine (TOUJEO SOLOSTAR) 300 UNIT/ML SOPN Inject 5 Units into the skin daily.    . polyethylene glycol (MIRALAX / GLYCOLAX) packet Take 17 g by mouth daily. 14 each 0  . spironolactone (ALDACTONE) 25 MG tablet Take 25 mg by mouth daily.    Marland Kitchen warfarin (COUMADIN) 5 MG tablet Take 2.5-5 mg by mouth See admin instructions. 5 mg every day except on Friday patient takes 2.5 mg    . bisacodyl (BISACODYL) 5 MG EC tablet Take 1 tablet (5 mg total) by mouth daily as needed for moderate constipation. 30 tablet 0    Home: Home Living Family/patient expects to be discharged to:: Unsure Living Arrangements: Spouse/significant other Available Help at Discharge: Family Type of Home: House Home Access: Ramped entrance Home Layout: One level Bathroom Shower/Tub: Walk-in shower, Door Bathroom Toilet: Handicapped height Home Equipment: Environmental consultant - 2 wheels, Bedside commode, Cane - single point, Shower seat Additional Comments: Pt at [facility] middle of october 2016-middle of november 2016. Pt home for 2 days and pt readmitted into hospital  Lives With: Spouse  Functional History: Prior Function Level of Independence: Independent with assistive device(s) (since d/c from snf on  11/22) Comments: Used RW Functional Status:  Mobility: Bed Mobility Overal bed mobility: Needs Assistance Bed Mobility: Supine to Sit Supine to sit: Min assist, HOB elevated General bed mobility comments: With [facility] elevated pt able to use UEs to A with coming to EOB.  Only MinA to scoot to EOB.   Transfers Overall transfer level: Needs assistance Equipment used: 1 person hand held assist Transfers: Sit to/from Stand, Stand Pivot Transfers Sit to Stand: Mod assist Stand pivot transfers: Mod assist General transfer comment: pt needs cues throughout for safe technique.  pt with difficulty coordinating Bil LEs to take pivotal steps during transfer.  pt remains very flexed.        ADL: ADL Overall ADL's : Needs assistance/impaired Eating/Feeding: Set up, Sitting Grooming: Set up, Sitting Upper Body Bathing: Minimal assitance, Sitting Lower Body Bathing: Sit to/from stand, Maximal assistance Upper Body Dressing : Minimal assistance, Sitting Lower Body Dressing: Maximal assistance, Sit to/from stand Toilet Transfer: Moderate assistance, Stand-pivot, BSC Functional mobility during ADLs: Moderate assistance, Rolling walker, Cueing for safety  Cognition: Cognition Overall Cognitive Status: No family/caregiver present to determine baseline cognitive functioning Arousal/Alertness:  Awake/alert Orientation Level: Oriented to person, Oriented to place, Oriented to time, Disoriented to situation Attention: Sustained Sustained Attention: Appears intact Memory: Impaired Memory Impairment: Retrieval deficit, Storage deficit Awareness: Appears intact Problem Solving: Impaired Problem Solving Impairment: Verbal basic Safety/Judgment:  (mildly impaired at baseline) Cognition Arousal/Alertness: Awake/alert Behavior During Therapy: Flat affect Overall Cognitive Status: No family/caregiver present to determine baseline cognitive functioning  Blood pressure 154/75, pulse 94, temperature 98.1 F  (36.7 C), temperature source Oral, resp. rate 16, height '4\' 11"'$  (1.499 m), weight 44.1 kg (97 lb 3.6 oz), SpO2 97 %. Physical Exam  Vitals reviewed. Constitutional: She is oriented to person, place, and time. She appears well-developed and well-nourished.  HENT:  Head: Normocephalic.  Right Ear: External ear normal.  Left Ear: External ear normal.  Eyes: Conjunctivae and EOM are normal.  Neck: Normal range of motion. Neck supple. No thyromegaly present.  Cardiovascular: Normal rate.   Irregular rhythm  Respiratory: Effort normal and breath sounds normal. No respiratory distress.  GI: Soft. Bowel sounds are normal. She exhibits no distension.  Musculoskeletal: She exhibits edema. She exhibits no tenderness.  Neurological: She is alert and oriented to person, place, and time. She has normal reflexes.  Alert and oriented 3 with multiple cues Follows commands.  Fair awareness of deficits ?Left hemifacial weakness Sensation: Intact to light touch Motor: RUE 5/5 proximal to distal RLE: 4/5 proximal and distal   LUE: 4/5 proximal to distal  LLE:  4-/5 Proximal to distal  Skin: Skin is warm and dry.  Psychiatric: She has a normal mood and affect. Her behavior is normal. Her speech is delayed.    Results for orders placed or performed during the hospital encounter of 07/04/15 (from the past 24 hour(s))  Glucose, capillary     Status: Abnormal   Collection Time: 07/07/15 11:05 AM  Result Value Ref Range   Glucose-Capillary 176 (H) 65 - 99 mg/dL  Glucose, capillary     Status: Abnormal   Collection Time: 07/07/15  4:22 PM  Result Value Ref Range   Glucose-Capillary 254 (H) 65 - 99 mg/dL  Glucose, capillary     Status: Abnormal   Collection Time: 07/07/15  9:28 PM  Result Value Ref Range   Glucose-Capillary 286 (H) 65 - 99 mg/dL  Glucose, capillary     Status: Abnormal   Collection Time: 07/08/15  7:08 AM  Result Value Ref Range   Glucose-Capillary 257 (H) 65 - 99 mg/dL   Comment  1 Notify RN    Comment 2 Document in Chart    No results found.  Assessment/Plan: Diagnosis:  Right MCA infarct Labs and images independently reviewed.  Records reviewed and summated above. Stroke: Continue secondary stroke prophylaxis and Risk Factor Modification listed below:   Antiplatelet therapy Blood Pressure Management:  Continue current medication with prn's with permisive HTN per primary team Statin Agent Diabetes management PT/OT for mobility, ADL training   1. Does the need for close, 24 hr/day medical supervision in concert with the patient's rehab needs make it unreasonable for this patient to be served in a less intensive setting? Potentially 2. Co-Morbidities requiring supervision/potential complications: HTN (monitor and provide prns in accordance with increased physical exertion and pain), ABLA (transfuse if necessary to ensure appropriate perfusion for increased activity tolerance), OA (ensure pain does not limit therapies),  diabetes mellitus (Monitor in accordance with exercise and adjust meds as necessary), diastolic congestive heart failure, atrial fibrillation (continue to monitor in accordance with increased activity), pulm HTN (  Monitor in accordance with increased physical activity and avoid UE resistance excercises) 3. Due to safety, disease management, medication administration and patient education, does the patient require 24 hr/day rehab nursing? Yes 4. Does the patient require coordinated care of a physician, rehab nurse, PT (1-2 hrs/day, 5 days/week), OT (1-2 hrs/day, 5 days/week) and SLP (1-2 hrs/day, 5 days/week) to address physical and functional deficits in the context of the above medical diagnosis(es)? N/A Addressing deficits in the following areas: balance, endurance, locomotion, strength, transferring, bathing, dressing, toileting, cognition and psychosocial support 5. Can the patient actively participate in an intensive therapy program of at least 3 hrs  of therapy per day at least 5 days per week? Yes 6. The potential for patient to make measurable gains while on inpatient rehab is excellent 7. Anticipated functional outcomes upon discharge from inpatient rehab are supervision and min assist  with PT, supervision and min assist with OT, supervision and min assist with SLP. 8. Estimated rehab length of stay to reach the above functional goals is: 16-19 days. 9. Does the patient have adequate social supports and living environment to accommodate these discharge functional goals? No 10. Anticipated D/C setting: Other 11. Anticipated post D/C treatments: HH therapy and Home excercise program 12. Overall Rehab/Functional Prognosis: good and fair  RECOMMENDATIONS: This patient's condition is appropriate for continued rehabilitative care in the following setting: It is unlikely  the patieent will be able to achieve a modified  independent level of functioning at discharge and does not have reliable support at home.  Therefore, would recommend SNF at present.  Patient has agreed to participate in recommended program. Yes Note that insurance prior authorization may be required for reimbursement for recommended care.  Comment: Rehab Admissions Coordinator to follow up.  Delice Lesch, MD 07/08/2015

## 2015-07-08 NOTE — Care Management Important Message (Signed)
Important Message  Patient Details  Name: LADORIS LYTHGOE MRN: [id number] Date of Birth: 1926/07/08   Medicare Important Message Given:  Yes    Jerrel Tiberio P Aradhana Gin 07/08/2015, 1:35 PM

## 2015-07-08 NOTE — Progress Notes (Signed)
Physical Therapy Treatment Patient Details Name: Wendy Hopkins MRN: [id number] DOB: [birth year] Today's Date: 07/08/2015    History of Present Illness pt presents with patchy multifocal Ischemic Infarcts in R MCA distribution.  pt with hx of multiple recent admissions for volume overload, falls, and pubic fxs.  pt with hx of CHF, Hip fx, HTN, DM, Pulmonary HTN, Lung CA, and servere Tricuspid Regurgitation.      PT Comments    Daughter present and able to further explain pt's recent baseline (s/p pelvic fractures). See below for full details. Pt had achieved modified independent status with rolling walker (including bed mobility, transfers, toileting, dressing, and simple meal preparation) while at SNF recovering from pelvic fractures. Patient able to transfer and maintain standing with RW with min assist and participate in pre-gait activities.    Follow Up Recommendations  CIR     Equipment Recommendations  None recommended by PT    Recommendations for Other Services Rehab consult     Precautions / Restrictions Precautions Precautions: Fall Precaution Comments: when pt fell & broke pelvis, she was sitting down and misjudged distance to seat and sat in the floor  Restrictions Weight Bearing Restrictions: No    Mobility  Bed Mobility                  Transfers Overall transfer level: Needs assistance Equipment used: Rolling walker (2 wheeled) Transfers: Sit to/from Stand Sit to Stand: Min assist         General transfer comment: stood from recliner using bil armrests and transitioned hands to RW  Ambulation/Gait                 Stairs            Wheelchair Mobility    Modified Rankin (Stroke Patients Only) Modified Rankin (Stroke Patients Only) Pre-Morbid Rankin Score: Moderate disability Modified Rankin: Severe disability     Balance Overall balance assessment: Needs assistance Sitting-balance support: Feet supported;No upper  extremity supported Sitting balance-Leahy Scale: Fair     Standing balance support: Bilateral upper extremity supported Standing balance-Leahy Scale: Poor Standing balance comment: stood x 90 seconds with min assist             High level balance activites: Other (comment) (pre-gait) High Level Balance Comments: stepping forward and backward with LLE (ataxic) and RLE (required assist LLE to prevent loss of balance)    Cognition Arousal/Alertness: Awake/alert Behavior During Therapy: Flat affect Overall Cognitive Status: Within Functional Limits for tasks assessed (Dtr present and reports at baseline and intact)                      Exercises Other Exercises Other Exercises: seated: bil toe raises, bil heel raises (pushing toes down into floor), and heel to shin with Lt heel. Discussed benefits of exercises and educated that she can also do these in supine.    General Comments General comments (skin integrity, edema, etc.): pt fatigued after standing 90 seconds (dyspnea); just prior to PT arrival, had done her bath and transferred bed to chair with nursing      Pertinent Vitals/Pain Pain Assessment: Faces Faces Pain Scale: Hurts little more Pain Location: Lt popliteal space Pain Descriptors / Indicators:  (she could not describe "just hurts") Pain Intervention(s): Limited activity within patient's tolerance;Monitored during session;Other (comment) (notified RN; no tenderness on palpitation)    Home Living  Prior Function            PT Goals (current goals can now be found in the care plan section) Acute Rehab PT Goals Patient Stated Goal: Dtr: if pt can get up in the morning, get dressed and make a simple breakfast, then dtr can manage her (and pt's husband) at home PT Goal Formulation: With patient/family Time For Goal Achievement: 07/20/15 Potential to Achieve Goals: Good Progress towards PT goals: Progressing toward goals     Frequency  Min 4X/week    PT Plan Current plan remains appropriate    Co-evaluation             End of Session Equipment Utilized During Treatment: Gait belt Activity Tolerance: Patient limited by fatigue Patient left: in chair;with call bell/phone within reach;with family/visitor present     Time: 2440-1027 PT Time Calculation (min) (ACUTE ONLY): 33 min  Charges:  $Therapeutic Activity: 8-22 mins $Neuromuscular Re-education: 8-22 mins                    G Codes:      Sophira Rumler 14-Jul-2015, 11:57 AM Pager [phone]

## 2015-07-08 NOTE — Progress Notes (Signed)
STROKE TEAM PROGRESS NOTE   HISTORY Wendy Hopkins is a 81 y.o. female with a history of CHF, afib on coumadin. She was in her normal state of health when she went to lay down on a couch at 9 PM 07/05/2015. When she went to arise at 10:45 PM, she noticed that she was having trouble standing. She did not notice the symptoms until she attempted to stand. She has been on Coumadin for atrial fibrillation and was recently in  [facility] for rehabilitation following a broken hip. She had only been home for a few days and was looking forward to celebrating her 89th birthday on Saturday. She denies numbness. She denies visual change. She denies nausea, vomiting. tpa was given. She was admitted for further evaluation and treatment.    SUBJECTIVE (INTERVAL HISTORY) Her daughter is at the bedside. Pt sitting up in chair at the bedside. Pt's husband had a stroke a few months ago.   OBJECTIVE Temp:  [97.7 F (36.5 C)-98.2 F (36.8 C)] 98.2 F (36.8 C) (11/28 0946) Pulse Rate:  [87-95] 89 (11/28 0946) Cardiac Rhythm:  [-]  Resp:  [16-17] 16 (11/28 0946) BP: (120-161)/(67-82) 142/79 mmHg (11/28 0946) SpO2:  [97 %-100 %] 98 % (11/28 0946)  CBC:   Recent Labs Lab 07/04/15 2344 07/04/15 2354  WBC 5.6  --   NEUTROABS 3.9  --   HGB 10.3* 11.6*  HCT 31.5* 34.0*  MCV 93.2  --   PLT 232  --     Basic Metabolic Panel:   Recent Labs Lab 07/04/15 2344 07/04/15 2354  NA 134* 135  K 3.9 3.9  CL 97* 95*  CO2 28  --   GLUCOSE 362* 361*  BUN 32* 33*  CREATININE 1.23* 1.20*  CALCIUM 8.7*  --     Lipid Panel:     Component Value Date/Time   CHOL 156 07/05/2015 0422   TRIG 69 07/05/2015 0422   TRIG 116 08/01/2010   HDL 61 07/05/2015 0422   CHOLHDL 2.6 07/05/2015 0422   VLDL 14 07/05/2015 0422   LDLCALC 81 07/05/2015 0422   HgbA1c:  Lab Results  Component Value Date   HGBA1C 7.6* 07/05/2015   Urine Drug Screen:     Component Value Date/Time   LABOPIA NONE DETECTED  07/05/2015 0955   COCAINSCRNUR NONE DETECTED 07/05/2015 0955   LABBENZ NONE DETECTED 07/05/2015 0955   AMPHETMU NONE DETECTED 07/05/2015 0955   THCU NONE DETECTED 07/05/2015 0955   LABBARB NONE DETECTED 07/05/2015 0955      IMAGING  Ct Head Wo Contrast 07/05/2015   1. No acute intracranial process.  2. Generalized age-related cerebral atrophy with chronic microvascular ischemic disease.   Dg Chest Port 1 View 07/05/2015  1. Findings consistent with improving congestive heart failure. 2. Low lung volumes with persistent right right lower lobe atelectasis. Subsegmental atelectasis left base .   2D echo estimated ejection fraction was in the range of 60% to 65%.  Carotid Doppler   1-39% ICA plaquing. Vertebral artery flow is antegrade.  MRI HEAD  07/06/2015 1. Patchy multi focal ischemic infarcts involving the right basal ganglia with extension into the right corona radiata, with additional involvement of the right insular cortex and overlying gray matter of the right frontoparietal and temporal regions. Right MCA distribution. Associated petechial hemorrhage without hemorrhagic transformation. 2. Age-related cerebral atrophy with mild chronic small vessel ischemic disease.  MRA HEAD  07/06/2015 1. No large or proximal arterial branch occlusion. Possible distal  right right M2 occlusion, superior division. 2. No high-grade or correctable stenosis. 3. Probable distal atheromatous disease involving the MCA branches bilaterally, right worse than left.   PHYSICAL EXAM General - Well nourished, well developed, in no apparent distress.  Ophthalmologic - Fundi not visualized due to noncooperation.  Cardiovascular - irregularly irregular heart rate and rhythm.  Mental Status -  Level of arousal and orientation to time, place, and person were intact. Language including expression, repetition, comprehension was assessed and found intact, naming 3/4, mild dysarthria. Fund of  Knowledge was assessed and was impaired.  Cranial Nerves II - XII - II - Visual field intact OU. III, IV, VI - Extraocular movements intact. V - Facial sensation intact bilaterally. VII - left mild facial droop, improved.  VIII - Hearing & vestibular intact bilaterally. X - Palate elevates symmetrically. XI - Chin turning & shoulder shrug intact bilaterally. XII - Tongue protrusion intact.  Motor Strength - The patient's strength was LUE 4/5 proximal and 5-/5 distal, LLE 4/5 proximal and 2/5 distal and pronator drift was present on the left.  5/5 RUE and RLE. Bulk was normal and fasciculations were absent.   Motor Tone - Muscle tone was assessed at the neck and appendages and was normal.  Reflexes - The patient's reflexes were 1+ in all extremities and she had no pathological reflexes.  Sensory - Light touch, temperature/pinprick were assessed and were symmetrical.    Cerebellar - Finger to nose with difficulty LUE due to weakness.   Gait and Station - not tested due to weakness and safety concerns.   ASSESSMENT/PLAN [name] is a 81 y.o. female with history of CHF, afib on Coumadin therapy, recent pelvic fracture, hyperlipidemia, hypertension, adenocarcinoma of the lung, diabetes mellitus, severe tricuspid regurgitation, and moderate to severe pulmonary hypertension presenting with left hemiparesis. She received IV t-PA 42 mg on 07/05/2015 at [facility].    Stroke:  Multiple Right MCA infarcts, embolic secondary to atrial fibrillation with subtherapeutic Coumadin level (1.22). S/p IV tPA.  Resultant  Left hemiparesis  MRI - Patchy multi focal R MCA ischemic infarcts  MRA - Possible distal right right M2 occlusion, superior division.  Carotid Doppler No significant stenosis   2D Echo - EF 60-65%. No cardiac source of emboli identified.  LDL - 81  HgbA1c 7.6  VTE prophylaxis Heparin 5000 units sq tid  DIET DYS 3 Room service appropriate?: Yes; Fluid consistency::  Thin  warfarin daily prior to admission, now on Aspirin 325 mg daily. Ok to [employer] anticoagulation today. Will  Bridge with aspirin.   Ongoing aggressive stroke risk factor management  Therapy recommendations: Inpatient rehabilitation. Consult in place  Disposition: pending. Medically ready for inpatient rehab once bed available.   CHF   CXR showed improving CHF  Put back on lasix '40mg'$   Still has LE edema but no crackles in lungs  BP monitoring   Afib   Daughter reported likely that pt not taking coumadin in NH - she saw it written on her record that it was discontinued, but she does not know why  Recent pelvis fracture but no surgery or active bleeding  Take coumadin at home for the last 3 days  INR 1.22 on admission  Ok to start anticoagulation today per Dr. Leonie Man  Hypertension  Stable Permissive hypertension (OK if < 180/105) but gradually normalize in 5-7 days  Hyperlipidemia  Home meds:  Lipitor 10 mg daily resumed in hospital  LDL 81, goal < 70  Increase  Lipitor to 20 mg daily.  Continue statin at discharge.  Diabetes  HgbA1c 7.6, goal < 7.0  Uncontrolled  On lantus and humology at home  Added Novolog Sensitive correction scale TID with HS scale.  Recent pelvic fracture  Conservative measure  No surgical intervention  Discharged from rehab  Able to walk at home with walker  Other Stroke Risk Factors  Advanced age  Hx of TIA   Other Active Problems  Anemia  Renal insufficiency  Hospital day # [place] for Pager information 07/08/2015 11:15 AM   I have personally examined this patient, reviewed notes, independently viewed imaging studies, participated in medical decision making and plan of care. I have made any additions or clarifications directly to the above note. Agree with note above. Start anticoagulation with warfarin. Medically stable for transfer to rehabilitation when bed  available  Antony Contras, MD Medical Director [facility] Pager: [phone] 07/08/2015 4:04 PM To contact Stroke Continuity provider, please refer to [email or web address] After hours, contact General Neurology

## 2015-07-09 ENCOUNTER — Other Ambulatory Visit: Payer: Self-pay

## 2015-07-09 DIAGNOSIS — M810 Age-related osteoporosis without current pathological fracture: Secondary | ICD-10-CM | POA: Diagnosis not present

## 2015-07-09 DIAGNOSIS — E559 Vitamin D deficiency, unspecified: Secondary | ICD-10-CM | POA: Diagnosis not present

## 2015-07-09 DIAGNOSIS — R1311 Dysphagia, oral phase: Secondary | ICD-10-CM | POA: Diagnosis not present

## 2015-07-09 DIAGNOSIS — I509 Heart failure, unspecified: Secondary | ICD-10-CM | POA: Diagnosis not present

## 2015-07-09 DIAGNOSIS — E162 Hypoglycemia, unspecified: Secondary | ICD-10-CM | POA: Diagnosis not present

## 2015-07-09 DIAGNOSIS — I4891 Unspecified atrial fibrillation: Secondary | ICD-10-CM | POA: Diagnosis not present

## 2015-07-09 DIAGNOSIS — I6309 Cerebral infarction due to thrombosis of other precerebral artery: Secondary | ICD-10-CM | POA: Diagnosis not present

## 2015-07-09 DIAGNOSIS — I5033 Acute on chronic diastolic (congestive) heart failure: Secondary | ICD-10-CM | POA: Diagnosis not present

## 2015-07-09 DIAGNOSIS — G8194 Hemiplegia, unspecified affecting left nondominant side: Secondary | ICD-10-CM | POA: Diagnosis not present

## 2015-07-09 DIAGNOSIS — I10 Essential (primary) hypertension: Secondary | ICD-10-CM | POA: Diagnosis not present

## 2015-07-09 DIAGNOSIS — R062 Wheezing: Secondary | ICD-10-CM | POA: Diagnosis not present

## 2015-07-09 DIAGNOSIS — M6281 Muscle weakness (generalized): Secondary | ICD-10-CM | POA: Diagnosis not present

## 2015-07-09 DIAGNOSIS — Z7901 Long term (current) use of anticoagulants: Secondary | ICD-10-CM | POA: Diagnosis not present

## 2015-07-09 DIAGNOSIS — R609 Edema, unspecified: Secondary | ICD-10-CM | POA: Diagnosis not present

## 2015-07-09 DIAGNOSIS — M1990 Unspecified osteoarthritis, unspecified site: Secondary | ICD-10-CM | POA: Diagnosis not present

## 2015-07-09 DIAGNOSIS — E119 Type 2 diabetes mellitus without complications: Secondary | ICD-10-CM | POA: Diagnosis not present

## 2015-07-09 DIAGNOSIS — I5032 Chronic diastolic (congestive) heart failure: Secondary | ICD-10-CM | POA: Diagnosis not present

## 2015-07-09 DIAGNOSIS — I639 Cerebral infarction, unspecified: Secondary | ICD-10-CM | POA: Diagnosis not present

## 2015-07-09 DIAGNOSIS — R531 Weakness: Secondary | ICD-10-CM | POA: Diagnosis not present

## 2015-07-09 DIAGNOSIS — I63139 Cerebral infarction due to embolism of unspecified carotid artery: Secondary | ICD-10-CM | POA: Diagnosis not present

## 2015-07-09 DIAGNOSIS — R41841 Cognitive communication deficit: Secondary | ICD-10-CM | POA: Diagnosis not present

## 2015-07-09 DIAGNOSIS — E785 Hyperlipidemia, unspecified: Secondary | ICD-10-CM | POA: Diagnosis not present

## 2015-07-09 DIAGNOSIS — R278 Other lack of coordination: Secondary | ICD-10-CM | POA: Diagnosis not present

## 2015-07-09 DIAGNOSIS — D509 Iron deficiency anemia, unspecified: Secondary | ICD-10-CM | POA: Diagnosis not present

## 2015-07-09 DIAGNOSIS — N390 Urinary tract infection, site not specified: Secondary | ICD-10-CM | POA: Diagnosis not present

## 2015-07-09 DIAGNOSIS — I63031 Cerebral infarction due to thrombosis of right carotid artery: Secondary | ICD-10-CM | POA: Diagnosis not present

## 2015-07-09 LAB — PROTIME-INR
INR: 1.23 (ref 0.00–1.49)
Prothrombin Time: 15.7 seconds — ABNORMAL HIGH (ref 11.6–15.2)

## 2015-07-09 LAB — GLUCOSE, CAPILLARY
GLUCOSE-CAPILLARY: 263 mg/dL — AB (ref 65–99)
Glucose-Capillary: 123 mg/dL — ABNORMAL HIGH (ref 65–99)

## 2015-07-09 MED ORDER — ASPIRIN 325 MG PO TABS: 325.0000 mg | ORAL_TABLET | Freq: Every day | ORAL | Status: DC

## 2015-07-09 MED ORDER — ATORVASTATIN CALCIUM 20 MG PO TABS: 20.0000 mg | ORAL_TABLET | Freq: Every day | ORAL | Status: DC

## 2015-07-09 MED ORDER — WARFARIN SODIUM 5 MG PO TABS: 5.0000 mg | ORAL_TABLET | Freq: Once | ORAL | Status: DC

## 2015-07-09 MED ORDER — FUROSEMIDE 40 MG PO TABS: 40.0000 mg | ORAL_TABLET | Freq: Every day | ORAL | Status: AC

## 2015-07-09 NOTE — Care Management Note (Signed)
Case Management Note  Patient Details  Name: KELE WITHEM MRN: [id number] Date of Birth: [birth year]  Subjective/Objective:                    Action/Plan: Not a candidate for CIR. CM spoke with Lenna Sciara (patients daughter) about possibly taking the patient home with home health services. [name] stated she wanted patient to return to [facility] and they would pay the co pays. Plan is for patient to be discharged to [facility]F. No further needs per CM.   Expected Discharge Date:                  Expected Discharge Plan:  [place]  In-House Referral:     Discharge planning Services     Post Acute Care Choice:    Choice offered to:     DME Arranged:    DME Agency:     HH Arranged:    [place] Agency:     Status of Service:  Completed, signed off  Medicare Important Message Given:  Yes Date Medicare IM Given:    Medicare IM give by:    Date Additional Medicare IM Given:    Additional Medicare Important Message give by:     If discussed at [place] of Stay Meetings, dates discussed:    Additional Comments:  Pollie Friar, RN 07/09/2015, 1:39 PM

## 2015-07-09 NOTE — Progress Notes (Signed)
Physical Therapy Treatment Patient Details Name: Wendy Hopkins MRN: [id number] DOB: March 26, 1926 Today's Date: 07/09/2015    History of Present Illness pt presents with patchy multifocal Ischemic Infarcts in R MCA distribution.  pt with hx of multiple recent admissions for volume overload, falls, and pubic fxs.  pt with hx of CHF, Hip fx, HTN, DM, Pulmonary HTN, Lung CA, and servere Tricuspid Regurgitation.      PT Comments    Patient demonstrated improvements in all areas compared to 11/28. Placed call to Karene Fry, Rehab Admissions Coordinator re: possible "second look" by MD and explained rationale to her. She acknowledged she would follow-up.    Follow Up Recommendations  CIR (see comments above)     Equipment Recommendations  None recommended by PT    Recommendations for Other Services Rehab consult     Precautions / Restrictions Precautions Precautions: Fall Precaution Comments: when pt fell & broke pelvis, she was sitting down and misjudged distance to seat and sat in the floor  Restrictions Weight Bearing Restrictions: No    Mobility  Bed Mobility Overal bed mobility: Needs Assistance Bed Mobility: Rolling;Sidelying to Sit Rolling: Modified independent (Device/Increase time) (to left) Sidelying to sit: Min guard;HOB elevated (HOB 15, with rail)       General bed mobility comments: close guard due to unsteady supporting herself on LUE as pushing up to sit (did not however need assist to recover); will need to work on bed flat and no rail to simulate home  Transfers Overall transfer level: Needs assistance Equipment used: Rolling walker (2 wheeled) Transfers: Sit to/from Stand Sit to Stand: Min guard         General transfer comment: stood from bed and then recliner and transitioned hands to RW with unsteadiness, however did not require physical assist to maintain balance  Ambulation/Gait Ambulation/Gait assistance: Mod assist;+2  safety/equipment Ambulation Distance (Feet): 10 Feet (4, seated to "regroup" 6) Assistive device: Rolling walker (2 wheeled) Gait Pattern/deviations: Step-to pattern;Decreased step length - right;Decreased stance time - left;Decreased dorsiflexion - left;Decreased weight shift to left;Shuffle;Ataxic Gait velocity: decr   General Gait Details: Patient continues with ataxic Lt leg with tendency to "overshoot" and take too large a step. Did not cross midline today. At times pt sliding Lt foot forward (vs lift and step) as she realized she could better control LLE this way.   Stairs            Wheelchair Mobility    Modified Rankin (Stroke Patients Only) Modified Rankin (Stroke Patients Only) Pre-Morbid Rankin Score: Moderate disability Modified Rankin: Moderately severe disability     Balance   Sitting-balance support: No upper extremity supported;Feet supported Sitting balance-Leahy Scale: Fair Sitting balance - Comments: with fatigue, leans to left without LOB however decr awareness   Standing balance support: Bilateral upper extremity supported Standing balance-Leahy Scale: Poor                      Cognition Arousal/Alertness: Awake/alert Behavior During Therapy: Flat affect Overall Cognitive Status: Within Functional Limits for tasks assessed                      Exercises      General Comments General comments (skin integrity, edema, etc.): less fatigued      Pertinent Vitals/Pain      Home Living                      Prior  Function            PT Goals (current goals can now be found in the care plan section) Acute Rehab PT Goals Patient Stated Goal: Dtr: if pt can get up in the morning, get dressed and make a simple breakfast, then dtr can manage her (and pt's husband) at home Time For Goal Achievement: 07/20/15 Progress towards PT goals: Progressing toward goals    Frequency  Min 4X/week    PT Plan Current plan remains  appropriate    Co-evaluation             End of Session Equipment Utilized During Treatment: Gait belt Activity Tolerance: Patient tolerated treatment well Patient left: in chair;with call bell/phone within reach;with nursing/sitter in room     Time: 1036-1057 PT Time Calculation (min) (ACUTE ONLY): 21 min  Charges:  $Gait Training: 8-22 mins                    G Codes:      Shawntae Lowy 07/24/2015, 11:35 AM Pager [phone]

## 2015-07-09 NOTE — Clinical Social Work Note (Signed)
Patient has a bed at [facility] with $160 daily co-pays. Pt's dtr is agreeable to this discharge plan.   PASARR: [phone] Rozell Searing, MSW, LCSWA [phone] 07/09/2015 1:30 PM

## 2015-07-09 NOTE — Clinical Social Work Note (Addendum)
Clinical Social Work Assessment  Patient Details  Name: Wendy Hopkins MRN: [id number] Date of Birth: 06/16/1926  Date of referral:  07/09/15               Reason for consult:  Discharge Planning                Permission sought to share information with:  Family Supports Permission granted to share information::  Yes, Verbal Permission Granted  Name::      (Wendy Hopkins)  Agency::   (SNF)  Relationship::   (daughter, Wendy Hopkins)  Sport and exercise psychologist Information:   Wendy Hopkins [phone])  Housing/Transportation Living arrangements for the past 2 months:  [place] of Information:  Patient Patient Interpreter Needed:  None Criminal Activity/Legal Involvement Pertinent to Current Situation/Hospitalization:  No - Comment as needed Significant Relationships:  Adult Children, Spouse Lives with:  Spouse Do you feel safe going back to the place where you live?  No Need for family participation in patient care:  Yes (Comment)  Care giving concerns:   Patient's daughter has expressed home safety concerns once patient is d/c from SNF.   Social Worker assessment / plan:   Patient a/o x4. BSW intern has spoken with patient at bedside to discuss discharge disposition. BSW intern made client aware that PT has reccommended SNF placement upon discharge from facility. BSW intern further presented patient with SNF list for review as well as explained SNF process and insurance coverage. Patient informed BSW intern that she was hoping to be transferred to CIR. However, patient did give BSW intern verbal permission to fax clinicals to SNF within [facility] region and also to contact daughter, Wendy Hopkins with update on discharge planning. BSW intern contacted daughter, Wendy Hopkins by phone. Patient's daughter stated that patient was a short-term resident at [facility] from October 6th-November 22,2016. Patient's daughter informed BSW intern that she does NOT want patient to  return to this facility. Patient's daughter also expressed concerns in reference to CIR placement. Patient's daughter is unable to provide 24 hour supervision for patient and felt that CIR would be more convenient. Patient made BSW intern aware that she lives with her spouse, but he is unable to care for her. Patient stated that her daughter is very supportive and will likely be providing supervision once discharged from SNF. BSW intern to fax patient's clinical to SNF for review. BSW to also follow up with patient and family with extended bed offers given. BSW intern remains available if any further Social Work needs arises.  Employment status:  Retired Forensic scientist:  Scientist, product/process development) PT Recommendations:  [place] / Referral to community resources:  [place]  Patient/Family's Response to care:   Patient appeared very flat and irritable. Patient mentioned to [facility] intern numerously that she wants to transferred to CIR.  Patient's daughter is very overwhelmed and still quite unclear on discharge plan for patient.    Patient/Family's Understanding of and Emotional Response to Diagnosis, Current Treatment, and Prognosis:   Patient and patient's family understands need for further medical care and rehab.  Emotional Assessment Appearance:  Appears stated age Attitude/Demeanor/Rapport:   (Annoyed) Affect (typically observed):  Blunt, Flat, Irritable Orientation:  Oriented to Self, Oriented to Place, Oriented to  Time, Oriented to Situation Alcohol / Substance use:  Not Applicable Psych involvement (Current and /or in the community):  No (Comment)  Discharge Needs  Concerns to be addressed:  No discharge needs identified Readmission within  the last 30 days:  No Current discharge risk:  None Barriers to Discharge:  No Barriers Identified   Leane Call, Student-SW 07/09/2015, 9:14 AM  ADDENDUM :  This note was cosigned in  [place] by Dr Leonie Man instead of FL2  Antony Contras, MD 07/09/2015 1242 pm

## 2015-07-09 NOTE — NC FL2 (Signed)
[place] LEVEL OF CARE SCREENING TOOL     IDENTIFICATION  Patient Name: Wendy Hopkins Birthdate: 1926/01/01 Sex: female Admission Date (Current Location): 07/04/2015  [place] and [place] Number:  Kathleen Argue)  [facility] MEDICARE/UHC MEDICARE- [id number]) Facility and Address:  [facility] [place] [address], [place], [place] [address]      Provider Number: [id number]  Attending Physician Name and Address:  Garvin Fila, MD  Relative Name and Phone Number:   Lenna Sciara, daughter- [phone])    Current Level of Care: Hospital Recommended Level of Care: [place] Prior Approval Number:    Date Approved/Denied:   PASRR Number:    Discharge Plan: SNF    Current Diagnoses: Patient Active Problem List   Diagnosis Date Noted  . CVA (cerebral infarction) 07/05/2015  . Fluid overload 05/25/2015  . Acute on chronic diastolic (congestive) heart failure ([place]) 05/25/2015  . AKI (acute kidney injury) ([place]) 05/25/2015  . Pressure ulcer 05/25/2015  . Hyperkalemia 05/15/2015  . Small vessel disease, cerebrovascular 05/15/2015  . Cerebral atrophy 05/15/2015  . Severe tricuspid regurgitation 05/15/2015  . Moderate to severe pulmonary hypertension ([place]) 05/15/2015  . Multiple pelvic fractures ([place]) 05/14/2015  . Dyspnea 01/18/2014  . Abnormal CT scan, chest 11/21/2013  . Constipation 11/08/2013  . Encounter for therapeutic drug monitoring 09/27/2013  . Vertebral compression fracture ([place])   . Cancer of lower lobe of lung ([place]) 04/21/2013  . Adenocarcinoma, lung ([place]) 11/14/2012  . Multinodular goiter (nontoxic), dominant right thyroid nodule 06/01/2011  . Osteoporosis, unspecified 02/24/2011  . GERD 10/23/2010  . Long term current use of anticoagulant 09/27/2010  . BRADYCARDIA 08/08/2009  . Atrial fibrillation ([place]) 01/22/2009  . Diabetes mellitus type 2, insulin dependent ([place]) 01/19/2009  . DYSLIPIDEMIA  01/19/2009  . ANEMIA, SECONDARY TO BLOOD LOSS 01/19/2009  . Essential hypertension 01/19/2009  . Chronic diastolic heart failure ([place]) 01/19/2009  . TRANSIENT ISCHEMIC ATTACK 01/19/2009    Orientation ACTIVITIES/SOCIAL BLADDER RESPIRATION    Self, Time, Situation, Place  Family supportive Indwelling catheter Normal  BEHAVIORAL SYMPTOMS/MOOD NEUROLOGICAL BOWEL NUTRITION STATUS        Diet  PHYSICIAN VISITS COMMUNICATION OF NEEDS Height & Weight Skin      '4\' 11"'$  (149.9 cm) 97 lbs. Normal          AMBULATORY STATUS RESPIRATION    Supervision limited Normal      Personal Care Assistance Level of Assistance  Bathing, Feeding, Dressing Bathing Assistance: Maximum assistance Feeding assistance: Maximum assistance Dressing Assistance: Maximum assistance      Functional Limitations Info                [place]  PT (By licensed PT), OT (By licensed OT), Speech therapy     PT Frequency:  (4x/week) OT Frequency:  (2x/week)           Additional Factors Info  Code Status, Allergies Code Status Info:  (FULL) Allergies Info:  (codeine)           Current Medications (07/09/2015):  This is the current hospital active medication list Current Facility-Administered Medications  Medication Dose Route Frequency Provider Last Rate Last Dose  . 0.9 %  sodium chloride infusion   Intravenous Continuous Greta Doom, MD 50 mL/hr at 07/06/15 0931    . acetaminophen (TYLENOL) tablet 650 mg  650 mg Oral Q4H PRN Greta Doom, MD   650 mg at 07/08/15 2149  . aspirin tablet  325 mg  325 mg Oral Daily Leotis Pain, MD   325 mg at 07/08/15 1033  . atorvastatin (LIPITOR) tablet 20 mg  20 mg Oral q1800 David L Rinehuls, PA-C   20 mg at 07/08/15 1714  . furosemide (LASIX) tablet 40 mg  40 mg Oral Daily Rosalin Hawking, MD   40 mg at 07/08/15 1033  . heparin injection 5,000 Units  5,000 Units Subcutaneous 3 times per day Leotis Pain, MD   5,000 Units  at 07/09/15 0612  . insulin aspart (novoLOG) injection 0-5 Units  0-5 Units Subcutaneous QHS Donzetta Starch, NP   0 Units at 07/08/15 2152  . insulin aspart (novoLOG) injection 0-9 Units  0-9 Units Subcutaneous TID WC Donzetta Starch, NP   1 Units at 07/09/15 [phone]  . insulin glargine (LANTUS) injection 5 Units  5 Units Subcutaneous Daily Rosalin Hawking, MD   5 Units at 07/08/15 1034  . labetalol (NORMODYNE,TRANDATE) injection 10 mg  10 mg Intravenous Q10 min PRN Greta Doom, MD      . pantoprazole (PROTONIX) EC tablet 40 mg  40 mg Oral Daily Norva Riffle, RPH   40 mg at 07/08/15 1034  . simethicone (MYLICON) chewable tablet 80 mg  80 mg Oral QID PRN Wallie Char   80 mg at 07/08/15 1044  . Warfarin - Pharmacist Dosing Inpatient   Does not apply [id number] Garvin Fila, MD         Discharge Medications: Please see discharge summary for a list of discharge medications.  Relevant Imaging Results:  Relevant Lab Results:  Recent Labs    Additional Information SSN: [id number]  Leane Call, Student-SW [phone]

## 2015-07-09 NOTE — Clinical Social Work Note (Signed)
Clinical Social Worker facilitated patient discharge including contacting patient family and facility to confirm patient discharge plans.  Clinical information faxed to facility and family agreeable with plan.  CSW arranged ambulance transport via [facility] to [facility].  RN to call report prior to discharge.  Clinical Social Worker will sign off for now as social work intervention is no longer needed. Please consult [place] again if new need arises.  Glendon Axe, MSW, LCSWA [phone] 07/09/2015 2:28 PM

## 2015-07-09 NOTE — Clinical Social Work Placement (Signed)
   CLINICAL SOCIAL WORK PLACEMENT  NOTE  Date:  07/09/2015  Patient Details  Name: Wendy Hopkins MRN: [id number] Date of Birth: 06/13/1926  Clinical Social Work is seeking post-discharge placement for this patient at the [place] level of care (*CSW will initial, date and re-position this form in  chart as items are completed):  Yes   Patient/family provided with [facility] Work Department's list of facilities offering this level of care within the geographic area requested by the patient (or if unable, by the patient's family).  Yes   Patient/family informed of their freedom to choose among providers that offer the needed level of care, that participate in Medicare, Medicaid or managed care program needed by the patient, have an available bed and are willing to accept the patient.  Yes   Patient/family informed of [facility]'s ownership interest in [facility] and [facility], as well as of the fact that they are under no obligation to receive care at these facilities.  PASRR submitted to EDS on       PASRR number received on       Existing PASRR number confirmed on 07/09/15     FL2 transmitted to all facilities in geographic area requested by pt/family on 07/09/15     FL2 transmitted to all facilities within larger geographic area on       Patient informed that his/her managed care company has contracts with or will negotiate with certain facilities, including the following:        Yes   Patient/family informed of bed offers received.  Patient chooses bed at  ([facility])     Physician recommends and patient chooses bed at      Patient to be transferred to  ([facility] [facility]) on 07/09/15.  Patient to be transferred to facility by  Corey Harold )     Patient family notified on 07/09/15 of transfer.  Name of family member notified:   (Pt's dtr, [name] )     PHYSICIAN Please sign  FL2     Additional Comment:    _______________________________________________ Rozell Searing, LCSW 07/09/2015, 1:22 PM

## 2015-07-09 NOTE — Progress Notes (Signed)
[place] for Coumadin Indication: atrial fibrillation  Allergies  Allergen Reactions  . Codeine Nausea And Vomiting         Patient Measurements: Height: '4\' 11"'$  (149.9 cm) Weight: 97 lb 3.6 oz (44.1 kg) IBW/kg (Calculated) : 43.2  Vital Signs: Temp: 97.9 F (36.6 C) (11/29 0902) Temp Source: Oral (11/29 0902) BP: 132/69 mmHg (11/29 0902) Pulse Rate: 98 (11/29 0902)  Labs:  Recent Labs  07/09/15 0233  LABPROT 15.7*  INR 1.23    Estimated Creatinine Clearance: 22.5 mL/min (by C-G formula based on Cr of 1.2).   Medical History: Past Medical History  Diagnosis Date  . Arthritis   . Hyperlipidemia   . 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 Closed fracture of unspecified part of femur 2008  . TIA (transient ischemic attack)   . Edema   . Chronic diastolic heart failure ([place])   . Osteoporosis with fracture     compression fx lower T spine and t4, L rib  . Thyroid nodule     dominate R nodule, s/p aspiration>abn path>6 mo obs planned ([facility] 06/01/11)  . GERD (gastroesophageal reflux disease)   . Hypertension 2006  . Atrial fibrillation (HCC)     chronic anticoag; and AFlutter  . History of radiation therapy 11/30/2012-12/09/2012    50 gray to right chest  . Vertebral compression fracture [facility])     Thoracic 12 and Thoracic 7  . Adenocarcinoma, lung ([place]) dx 11/08/12    s/p CT chest bx - XRT thru 12/2012  . Diabetes mellitus, type II, insulin dependent ([place])     over 40 years ago  . Small vessel disease, cerebrovascular 05/15/2015  . Cerebral atrophy 05/15/2015  . Moderate to severe pulmonary hypertension ([place]) 05/15/2015  . Severe tricuspid regurgitation 05/15/2015    Assessment: 81 year old female restarting Coumadin today s/p TPA for CVA INR low on admission on 5 mg daily except 2.5 mg on Friday  INR today = 1.23  Goal of Therapy:  INR 2-3 Monitor platelets by anticoagulation protocol: Yes    Plan:  Coumadin 5 mg po x 1 today Daily INR  Thank you Anette Guarneri, PharmD [phone]  07/09/2015,9:52 AM

## 2015-07-09 NOTE — Consult Note (Signed)
   [facility] CM Inpatient Consult   07/09/2015  CHYRL ELWELL 06/06/1926 [id number] Patient evaluated for community based chronic disease management services with [facility] as a benefit of patient's Medicare Insurance. Spoke with patient and her daughter , Alric Quan,  at bedside to explain [facility] Management services. Consent form signed. Patient was admitted with CVA.  [name] endorses that Dr. Billey Gosling is her new primary care provider.  She had an appointment prior to admission on 07/10/15 which the daughter states she has already cancelled.  She is hopeful that the patient can return to [facility] for rehab.  Patient will receive post hospital discharge call and will be evaluated for monthly home visits for assessments and disease process education.  Left contact information and [facility] literature at bedside. Made Inpatient Case Manager aware that [facility] Management following. Of note, [facility] Management services does not replace or interfere with any services that are arranged by inpatient case management or social work.  For additional questions or referrals please contact:   Natividad Brood, RN BSN [facility] Liaison  [phone] business mobile phone

## 2015-07-09 NOTE — Progress Notes (Signed)
Pt discharged to [name] via [facility]. Iv d/c'd, foley d/c'd

## 2015-07-09 NOTE — Progress Notes (Signed)
STROKE TEAM PROGRESS NOTE    SUBJECTIVE (INTERVAL HISTORY) Asking, "Why don't I qualify for rehab here?" Neurologically stable. No issues   OBJECTIVE Temp:  [97.8 F (36.6 C)-98.3 F (36.8 C)] 97.9 F (36.6 C) (11/29 0902) Pulse Rate:  [85-98] 98 (11/29 0902) Cardiac Rhythm:  [-]  Resp:  [16-18] 16 (11/29 0902) BP: (123-155)/(69-82) 132/69 mmHg (11/29 0902) SpO2:  [97 %-100 %] 98 % (11/29 0902)  CBC:   Recent Labs Lab 07/04/15 2344 07/04/15 2354  WBC 5.6  --   NEUTROABS 3.9  --   HGB 10.3* 11.6*  HCT 31.5* 34.0*  MCV 93.2  --   PLT 232  --     Basic Metabolic Panel:   Recent Labs Lab 07/04/15 2344 07/04/15 2354  NA 134* 135  K 3.9 3.9  CL 97* 95*  CO2 28  --   GLUCOSE 362* 361*  BUN 32* 33*  CREATININE 1.23* 1.20*  CALCIUM 8.7*  --     Lipid Panel:     Component Value Date/Time   CHOL 156 07/05/2015 0422   TRIG 69 07/05/2015 0422   TRIG 116 08/01/2010   HDL 61 07/05/2015 0422   CHOLHDL 2.6 07/05/2015 0422   VLDL 14 07/05/2015 0422   LDLCALC 81 07/05/2015 0422   HgbA1c:  Lab Results  Component Value Date   HGBA1C 7.6* 07/05/2015   Urine Drug Screen:     Component Value Date/Time   LABOPIA NONE DETECTED 07/05/2015 0955   COCAINSCRNUR NONE DETECTED 07/05/2015 0955   LABBENZ NONE DETECTED 07/05/2015 0955   AMPHETMU NONE DETECTED 07/05/2015 0955   THCU NONE DETECTED 07/05/2015 0955   LABBARB NONE DETECTED 07/05/2015 0955      IMAGING  Ct Head Wo Contrast 07/05/2015   1. No acute intracranial process.  2. Generalized age-related cerebral atrophy with chronic microvascular ischemic disease.   Dg Chest Port 1 View 07/05/2015  1. Findings consistent with improving congestive heart failure. 2. Low lung volumes with persistent right right lower lobe atelectasis. Subsegmental atelectasis left base .   2D echo estimated ejection fraction was in the range of 60% to 65%.  Carotid Doppler   1-39% ICA plaquing. Vertebral artery flow is  antegrade.  MRI HEAD  07/06/2015 1. Patchy multi focal ischemic infarcts involving the right basal ganglia with extension into the right corona radiata, with additional involvement of the right insular cortex and overlying gray matter of the right frontoparietal and temporal regions. Right MCA distribution. Associated petechial hemorrhage without hemorrhagic transformation. 2. Age-related cerebral atrophy with mild chronic small vessel ischemic disease.  MRA HEAD  07/06/2015 1. No large or proximal arterial branch occlusion. Possible distal right right M2 occlusion, superior division. 2. No high-grade or correctable stenosis. 3. Probable distal atheromatous disease involving the MCA branches bilaterally, right worse than left.   PHYSICAL EXAM General - Well nourished, well developed, in no apparent distress.  Ophthalmologic - Fundi not visualized due to noncooperation.  Cardiovascular - irregularly irregular heart rate and rhythm.  Mental Status -  Level of arousal and orientation to time, place, and person were intact. Language including expression, repetition, comprehension was assessed and found intact,   mild dysarthria. Fund of Knowledge was assessed and was impaired.  Cranial Nerves II - XII - II - Visual field intact OU. III, IV, VI - Extraocular movements intact. V - Facial sensation intact bilaterally. VII - left mild facial droop, improved.  VIII - Hearing & vestibular intact bilaterally. X - Palate elevates  symmetrically. XI - Chin turning & shoulder shrug intact bilaterally. XII - Tongue protrusion intact.  Motor Strength - The patient's strength was LUE 4/5 proximal and 5-/5 distal, LLE 4/5 proximal and 2/5 distal and pronator drift was present on the left.  5/5 RUE and RLE. Bulk was normal and fasciculations were absent.   Motor Tone - Muscle tone was assessed at the neck and appendages and was normal.  Reflexes - The patient's reflexes were 1+ in all extremities  and she had no pathological reflexes.  Sensory - Light touch, temperature/pinprick were assessed and were symmetrical.    Cerebellar - Finger to nose with difficulty LUE due to weakness.   Gait and Station - not tested due to weakness and safety concerns.   ASSESSMENT/PLAN Wendy Hopkins is a 81 y.o. female with history of CHF, afib on Coumadin therapy, recent pelvic fracture, hyperlipidemia, hypertension, adenocarcinoma of the lung, diabetes mellitus, severe tricuspid regurgitation, and moderate to severe pulmonary hypertension presenting with left hemiparesis. She received IV t-PA 42 mg on 07/05/2015 at [facility].    Stroke:  Multiple Right MCA infarcts, embolic secondary to atrial fibrillation with subtherapeutic Coumadin level (1.22). S/p IV tPA.  Resultant  Left hemiparesis  MRI - Patchy multi focal R MCA ischemic infarcts  MRA - Possible distal right right M2 occlusion, superior division.  Carotid Doppler No significant stenosis   2D Echo - EF 60-65%. No cardiac source of emboli identified.  LDL - 81  HgbA1c 7.6  VTE prophylaxis Heparin 5000 units sq tid  DIET DYS 3 Room service appropriate?: Yes; Fluid consistency:: Thin  warfarin daily prior to admission, now on Aspirin 325 mg daily. Ok to [employer] anticoagulation today. Will  Bridge with aspirin.   Ongoing aggressive stroke risk factor management  Therapy recommendations: Inpatient rehabilitation, however they do not feel she will progress to a modified independent level by discharge and does not have reliable help at home. Therefore, plan changed to SNF  Disposition: pending. Medically ready for discharge once bed available.   CHF   CXR showed improving CHF  Put back on lasix '40mg'$   Still has LE edema but no crackles in lungs  BP monitoring   Afib   Daughter reported likely that pt not taking coumadin in NH - she saw it written on her record that it was discontinued, but she does not know why  Recent  pelvis fracture but no surgery or active bleeding  Take coumadin at home for the last 3 days  INR 1.22 on admission  Ok to start anticoagulation today per Dr. Leonie Man  Hypertension  Stable Permissive hypertension (OK if < 180/105) but gradually normalize in 5-7 days  Hyperlipidemia  Home meds:  Lipitor 10 mg daily resumed in hospital  LDL 81, goal < 70  Increase Lipitor to 20 mg daily.  Continue statin at discharge.  Diabetes  HgbA1c 7.6, goal < 7.0  Uncontrolled  On lantus and humology at home  Added Novolog Sensitive correction scale TID with HS scale.  Recent pelvic fracture  Conservative measure  No surgical intervention  Discharged from rehab  Able to walk at home with walker  Other Stroke Risk Factors  Advanced age  Hx of TIA   Other Active Problems  Anemia  Renal insufficiency  Hospital day # 4  I have personally examined this patient, reviewed notes, independently viewed imaging studies, participated in medical decision making and plan of care. I have made any additions or clarifications directly to  the above note. Agree with note above. Mobilize out of bed. PT OT. Patient has deemed not to be a candidate for inpatient rehabilitation hence we will pursue rehabilitation in a skilled nursing setting.  Antony Contras, MD Medical Director [facility] Stroke Center Pager: [phone] 07/09/2015 12:43 PM  07/09/2015 10:09 AM To contact Stroke Continuity provider, please refer to [email or web address] After hours, contact General Neurology

## 2015-07-09 NOTE — Discharge Summary (Signed)
Stroke Discharge Summary  Patient ID: Wendy Hopkins   MRN: [id number]      DOB: [birth year]  Date of Admission: 07/04/2015 Date of Discharge: 07/09/2015  Attending Physician:  Garvin Fila, MD, Stroke MD  Consulting Physician(s):    Delice Lesch, MD (Physical Medicine & Rehabtilitation) Patient's PCP:  Binnie Rail, MD  DISCHARGE DIAGNOSIS:  Principal Problem:   CVA (cerebral infarction) - Multiple Right MCA infarcts, embolic secondary to afib w/ subtherapeutic INR S/p IV tPA. Active Problems:   Diabetes mellitus type 2, insulin dependent (HCC)   Essential hypertension   Atrial fibrillation (HCC)   Chronic diastolic heart failure ([place])   Long term current use of anticoagulant  Past Medical History  Diagnosis Date  . Arthritis   . Hyperlipidemia   . 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 Closed fracture of unspecified part of femur 2008  . TIA (transient ischemic attack)   . Edema   . Chronic diastolic heart failure ([place])   . Osteoporosis with fracture     compression fx lower T spine and t4, L rib  . Thyroid nodule     dominate R nodule, s/p aspiration>abn path>6 mo obs planned ([place] 06/01/11)  . GERD (gastroesophageal reflux disease)   . Hypertension 2006  . Atrial fibrillation (HCC)     chronic anticoag; and AFlutter  . History of radiation therapy 11/30/2012-12/09/2012    50 gray to right chest  . Vertebral compression fracture [facility])     Thoracic 12 and Thoracic 7  . Adenocarcinoma, lung ([place]) dx 11/08/12    s/p CT chest bx - XRT thru 12/2012  . Diabetes mellitus, type II, insulin dependent ([place])     over 40 years ago  . Small vessel disease, cerebrovascular 05/15/2015  . Cerebral atrophy 05/15/2015  . Moderate to severe pulmonary hypertension ([place]) 05/15/2015  . Severe tricuspid regurgitation 05/15/2015   Past Surgical History  Procedure Laterality Date  . Right femur  06/25/07    Intramedullary open reduction internal fixation  .  Vitrectomy  2007    right eye with cataract repair - dr. Cordelia Pen  . Tonsillectomy  1947      Medication List    STOP taking these medications        bisacodyl 5 MG EC tablet  Commonly known as:  bisacodyl      TAKE these medications        acetaminophen 500 MG tablet  Commonly known as:  TYLENOL  Take 500 mg by mouth every 6 (six) hours as needed for mild pain.     aspirin 325 MG tablet  Take 1 tablet (325 mg total) by mouth daily. Stop taking when INR is therapeutic on warfarin     atorvastatin 20 MG tablet  Commonly known as:  LIPITOR  Take 1 tablet (20 mg total) by mouth daily.     cholecalciferol 1000 UNITS tablet  Commonly known as:  VITAMIN D  Take 1,000 Units by mouth daily.     furosemide 40 MG tablet  Commonly known as:  LASIX  Take 1 tablet (40 mg total) by mouth daily.     insulin aspart 100 UNIT/ML injection  Commonly known as:  novoLOG  Inject 0-4 Units into the skin 4 (four) times daily as needed for high blood sugar. Sliding scale     Insulin Glargine 300 UNIT/ML Sopn  Commonly known as:  TOUJEO SOLOSTAR  Inject 5 Units into  the skin daily.     polyethylene glycol packet  Commonly known as:  MIRALAX / GLYCOLAX  Take 17 g by mouth daily.     spironolactone 25 MG tablet  Commonly known as:  ALDACTONE  Take 25 mg by mouth daily.     warfarin 5 MG tablet  Commonly known as:  COUMADIN  Take 2.5-5 mg by mouth See admin instructions. 5 mg every day except on Friday patient takes 2.5 mg        LABORATORY STUDIES CBC    Component Value Date/Time   WBC 5.6 07/04/2015 2344   WBC 6.3 12/04/2014   RBC 3.38* 07/04/2015 2344   HGB 11.6* 07/04/2015 2354   HCT 34.0* 07/04/2015 2354   HCT 26.3* 05/26/2015 1523   PLT 232 07/04/2015 2344   MCV 93.2 07/04/2015 2344   MCH 30.5 07/04/2015 2344   MCHC 32.7 07/04/2015 2344   RDW 16.1* 07/04/2015 2344   LYMPHSABS 1.1 07/04/2015 2344   MONOABS 0.5 07/04/2015 2344   EOSABS 0.0 07/04/2015 2344   BASOSABS  0.0 07/04/2015 2344   CMP    Component Value Date/Time   NA 135 07/04/2015 2354   NA 130* 07/17/2013   K 3.9 07/04/2015 2354   CL 95* 07/04/2015 2354   CO2 28 07/04/2015 2344   GLUCOSE 361* 07/04/2015 2354   BUN 33* 07/04/2015 2354   BUN 16.2 11/30/2014 1221   BUN 15 07/17/2013   CREATININE 1.20* 07/04/2015 2354   CREATININE 1.3* 11/30/2014 1221   CREATININE 0.8 07/17/2013   CALCIUM 8.7* 07/04/2015 2344   PROT 6.2* 07/04/2015 2344   ALBUMIN 3.3* 07/04/2015 2344   AST 20 07/04/2015 2344   ALT 13* 07/04/2015 2344   ALKPHOS 193* 07/04/2015 2344   BILITOT 0.4 07/04/2015 2344   GFRNONAA 39* 07/04/2015 2344   GFRAA 45* 07/04/2015 2344   COAGS Lab Results  Component Value Date   INR 1.23 07/09/2015   INR 1.22 07/04/2015   INR 2.07* 05/31/2015   Lipid Panel    Component Value Date/Time   CHOL 156 07/05/2015 0422   TRIG 69 07/05/2015 0422   TRIG 116 08/01/2010   HDL 61 07/05/2015 0422   CHOLHDL 2.6 07/05/2015 0422   VLDL 14 07/05/2015 0422   LDLCALC 81 07/05/2015 0422   HgbA1C  Lab Results  Component Value Date   HGBA1C 7.6* 07/05/2015   Urinalysis    Component Value Date/Time   COLORURINE YELLOW 07/05/2015 0955   APPEARANCEUR CLEAR 07/05/2015 0955   LABSPEC 1.021 07/05/2015 0955   PHURINE 7.0 07/05/2015 0955   GLUCOSEU >1000* 07/05/2015 0955   GLUCOSEU NEGATIVE 11/21/2013 1009   HGBUR NEGATIVE 07/05/2015 0955   BILIRUBINUR NEGATIVE 07/05/2015 0955   KETONESUR 15* 07/05/2015 0955   PROTEINUR NEGATIVE 07/05/2015 0955   UROBILINOGEN 0.2 05/25/2015 1806   NITRITE NEGATIVE 07/05/2015 0955   LEUKOCYTESUR NEGATIVE 07/05/2015 0955   Urine Drug Screen     Component Value Date/Time   LABOPIA NONE DETECTED 07/05/2015 0955   COCAINSCRNUR NONE DETECTED 07/05/2015 0955   LABBENZ NONE DETECTED 07/05/2015 0955   AMPHETMU NONE DETECTED 07/05/2015 0955   THCU NONE DETECTED 07/05/2015 0955   LABBARB NONE DETECTED 07/05/2015 0955    Alcohol Level    Component Value  Date/Time   ETH <5 07/05/2015 0018    SIGNIFICANT DIAGNOSTIC STUDIES Ct Head Wo Contrast 07/05/2015  1. No acute intracranial process.  2. Generalized age-related cerebral atrophy with chronic microvascular ischemic disease.   Dg Chest [facility]  1 View 07/05/2015  1. Findings consistent with improving congestive heart failure. 2. Low lung volumes with persistent right right lower lobe atelectasis. Subsegmental atelectasis left base .   2D echo estimated ejection fraction was in the range of 60% to 65%.  Carotid Doppler  1-39% ICA plaquing. Vertebral artery flow is antegrade.  MRI HEAD  07/06/2015 1. Patchy multi focal ischemic infarcts involving the right basal ganglia with extension into the right corona radiata, with additional involvement of the right insular cortex and overlying gray matter of the right frontoparietal and temporal regions. Right MCA distribution. Associated petechial hemorrhage without hemorrhagic transformation. 2. Age-related cerebral atrophy with mild chronic small vessel ischemic disease.  MRA HEAD  07/06/2015 1. No large or proximal arterial branch occlusion. Possible distal right right M2 occlusion, superior division. 2. No high-grade or correctable stenosis. 3. Probable distal atheromatous disease involving the MCA branches bilaterally, right worse than left.     HISTORY OF PRESENT ILLNESS Wendy Hopkins is a 81 y.o. female with a history of CHF, afib on coumadin. She was in her normal state of health when she went to lay down on a couch at 9 PM 07/05/2015. When she went to arise at 10:45 PM, she noticed that she was having trouble standing. She did not notice the symptoms until she attempted to stand. She has been on Coumadin for atrial fibrillation and was recently in [facility] home for rehabilitation following a broken hip. She had only been home for a few days and was looking forward to celebrating her 89th birthday on Saturday. She denies  numbness. She denies visual change. She denies nausea, vomiting. tpa was given. She was admitted for further evaluation and treatment.     HOSPITAL COURSE Ms. ZANOBIA GRIEBEL is a 81 y.o. female with history of CHF, afib on Coumadin therapy, recent pelvic fracture, hyperlipidemia, hypertension, adenocarcinoma of the lung, diabetes mellitus, severe tricuspid regurgitation, and moderate to severe pulmonary hypertension presenting with left hemiparesis. She received IV t-PA 42 mg on 07/05/2015 at [facility].   Stroke: Multiple Right MCA infarcts, embolic secondary to atrial fibrillation with subtherapeutic Coumadin level (1.22). S/p IV tPA.  Resultant Left hemiparesis  MRI - Patchy multi focal R MCA ischemic infarcts  MRA - Possible distal right right M2 occlusion, superior division.  Carotid Doppler No significant stenosis  2D Echo - EF 60-65%. No cardiac source of emboli identified.  LDL - 81  HgbA1c 7.6  warfarin daily prior to admission, treated with Aspirin 325 mg daily in the hospital. Once stable, restarted anticoagulation. Bridge with aspirin. Discontinue aspirin once INR therapeutic  Ongoing aggressive stroke risk factor management  Therapy recommendations: Inpatient rehabilitation, however they do not feel she will progress to a modified independent level by discharge and does not have reliable help at home. Therefore, plan changed to SNF   Disposition: Discharge to skilled nursing facility for ongoing PT, OT and ST.   CHF   CXR showed improving CHF  Put back on lasix '40mg'$   Still has LE edema but no crackles in lungs  Afib   Daughter reported likely that pt not taking coumadin in NH - she saw it written on her record that it was discontinued, but she does not know why. When she was discharged home, her daughter resumed her coumadin. Had coumadin for 3 days prior to admission  INR 1.22 on admission  Restarted coumadin with aspirin  bridge  Hypertension  Stable  Hyperlipidemia  Home meds: Lipitor 10  mg daily  LDL 81, goal < 70  Increased Lipitor to 20 mg daily.  Continue statin at discharge.  Diabetes  HgbA1c 7.6, goal < 7.0  Uncontrolled  On lantus and humolog at home  For hospitaization Added Novolog Sensitive correction scale TID with HS scale.  Recent pelvic fracture  Recent pelvis fracture but no surgery or active bleeding  Conservative measure  No surgical intervention  Discharged from rehab  Able to walk at home with walker  Other Stroke Risk Factors  Advanced age  Hx of TIA  Other Active Problems  Anemia  Renal insufficiency    DISCHARGE EXAM Blood pressure 132/69, pulse 98, temperature 97.9 F (36.6 C), temperature source Oral, resp. rate 16, height '4\' 11"'$  (1.499 m), weight 44.1 kg (97 lb 3.6 oz), SpO2 98 %. General - Well nourished, well developed, in no apparent distress. Ophthalmologic - Fundi not visualized due to noncooperation. Cardiovascular - irregularly irregular heart rate and rhythm. Mental Status - Level of arousal and orientation to time, place, and person were intact. Language including expression, repetition, comprehension was assessed and found intact, mild dysarthria. Fund of Knowledge was assessed and was impaired. Cranial Nerves II - XII - II - Visual field intact OU. III, IV, VI - Extraocular movements intact. V - Facial sensation intact bilaterally. VII - left mild facial droop, improved.  VIII - Hearing & vestibular intact bilaterally. X - Palate elevates symmetrically. XI - Chin turning & shoulder shrug intact bilaterally. XII - Tongue protrusion intact. Motor Strength - The patient's strength was LUE 4/5 proximal and 5-/5 distal, LLE 4/5 proximal and 2/5 distal and pronator drift was present on the left. 5/5 RUE and RLE. Bulk was normal and fasciculations were absent.  Motor Tone - Muscle tone was assessed at the neck and appendages  and was normal. Reflexes - The patient's reflexes were 1+ in all extremities and she had no pathological reflexes. Sensory - Light touch, temperature/pinprick were assessed and were symmetrical.  Cerebellar - Finger to nose with difficulty LUE due to weakness. Gait and Station - not tested due to weakness and safety concerns.   Discharge Diet   DIET DYS 3 Room service appropriate?: Yes; Fluid consistency:: Thin liquids  DISCHARGE PLAN  Disposition:  Discharge to skilled nursing facility for ongoing PT, OT and ST.   aspirin 325 mg daily as a bridge to warfarin daily for secondary stroke prevention. Discontinue aspirin once INR therapeutic  Follow-up Binnie Rail, MD in 2 weeks.  Follow-up with Dr. Antony Contras, Stroke Clinic in 2 months.  40 minutes were spent preparing discharge.  [place] Emily for Pager information 07/09/2015 1:58 PM   I have personally examined this patient, reviewed notes, independently viewed imaging studies, participated in medical decision making and plan of care. I have made any additions or clarifications directly to the above note. Agree with note above.    Antony Contras, MD Medical Director [facility] Pager: [phone] 07/09/2015 3:40 PM

## 2015-07-09 NOTE — Clinical Social Work Placement (Signed)
   CLINICAL SOCIAL WORK PLACEMENT  NOTE  Date:  07/09/2015  Patient Details  Name: Wendy Hopkins MRN: [id number] Date of Birth: 03-10-1926  Clinical Social Work is seeking post-discharge placement for this patient at the [place] level of care (*CSW will initial, date and re-position this form in  chart as items are completed):  Yes   Patient/family provided with [place] Work Department's list of facilities offering this level of care within the geographic area requested by the patient (or if unable, by the patient's family).  Yes   Patient/family informed of their freedom to choose among providers that offer the needed level of care, that participate in Medicare, Medicaid or managed care program needed by the patient, have an available bed and are willing to accept the patient.  Yes   Patient/family informed of [facility]'s ownership interest in [facility] and [facility], as well as of the fact that they are under no obligation to receive care at these facilities.  PASRR submitted to EDS on       PASRR number received on       Existing PASRR number confirmed on       FL2 transmitted to all facilities in geographic area requested by pt/family on 07/09/15     FL2 transmitted to all facilities within larger geographic area on       Patient informed that his/her managed care company has contracts with or will negotiate with certain facilities, including the following:            Patient/family informed of bed offers received.  Patient chooses bed at       Physician recommends and patient chooses bed at      Patient to be transferred to   on  .  Patient to be transferred to facility by       Patient family notified on   of transfer.  Name of family member notified:        PHYSICIAN       Additional Comment:    _______________________________________________ Leane Call, Student-SW 07/09/2015, 9:00 AM

## 2015-07-10 ENCOUNTER — Encounter: Payer: Self-pay | Admitting: *Deleted

## 2015-07-10 ENCOUNTER — Ambulatory Visit: Payer: Medicare Other | Admitting: Internal Medicine

## 2015-07-10 ENCOUNTER — Ambulatory Visit: Payer: Medicare Other

## 2015-07-11 DIAGNOSIS — D509 Iron deficiency anemia, unspecified: Secondary | ICD-10-CM | POA: Diagnosis not present

## 2015-07-11 DIAGNOSIS — I639 Cerebral infarction, unspecified: Secondary | ICD-10-CM | POA: Diagnosis not present

## 2015-07-11 DIAGNOSIS — Z7901 Long term (current) use of anticoagulants: Secondary | ICD-10-CM | POA: Diagnosis not present

## 2015-07-11 DIAGNOSIS — I5032 Chronic diastolic (congestive) heart failure: Secondary | ICD-10-CM | POA: Diagnosis not present

## 2015-07-11 DIAGNOSIS — E119 Type 2 diabetes mellitus without complications: Secondary | ICD-10-CM | POA: Diagnosis not present

## 2015-07-15 ENCOUNTER — Ambulatory Visit: Admission: RE | Admit: 2015-07-15 | Payer: Medicare Other | Source: Ambulatory Visit

## 2015-07-15 ENCOUNTER — Ambulatory Visit: Payer: Medicare Other

## 2015-07-15 ENCOUNTER — Ambulatory Visit (HOSPITAL_COMMUNITY): Payer: Medicare Other

## 2015-07-17 ENCOUNTER — Ambulatory Visit: Payer: Medicare Other | Admitting: *Deleted

## 2015-07-18 ENCOUNTER — Other Ambulatory Visit: Payer: Self-pay | Admitting: *Deleted

## 2015-07-18 ENCOUNTER — Ambulatory Visit: Payer: Medicare Other | Admitting: Radiation Oncology

## 2015-07-18 NOTE — Patient Outreach (Signed)
[facility]) Care Management  07/18/2015  Wendy Hopkins 1926-06-05 [id number]  Assessment: Care coordination Referral received from hospital liaison Wendy Hopkins) to follow-up patient on transition back home from [facility]. Noted In basket message to Wendy Hopkins to follow-up patient and notify community care Nurse, children's of discharge from SNF (Skilled nursing facility).  Plan: Will await for [name] social Hopkins's advise of patient's discharge back home. Will follow-up patient after discharge from skilled nursing facility.  Wendy Hopkins Wendy Hopkins, BSN, RN-BC [place] Management Coordinator Cell: [phone]

## 2015-07-23 ENCOUNTER — Other Ambulatory Visit: Payer: Self-pay | Admitting: Licensed Clinical Social Worker

## 2015-07-23 NOTE — Patient Outreach (Signed)
[facility]) Care Management  07/23/2015  GRACIA SAGGESE 18-Sep-1925 [id number]   Assessment-This CSW is covering for [employer] and completed outreach to patient on 07/23/15 per current CSW's wishes. CSW spoke to patient's daughter Lenna Sciara and introduced self and reason for call. Patient's daughter is aware that appointment last week will be rescheduled once CSW Nat Christen has returned to work. Patient's daughter denies any immediate needs.  Plan-CSW will update CSW [employer]  Eula Fried, BSW, MSW, [email or web address] Phone: [phone] Fax: [phone]

## 2015-07-25 DIAGNOSIS — E119 Type 2 diabetes mellitus without complications: Secondary | ICD-10-CM | POA: Diagnosis not present

## 2015-07-25 DIAGNOSIS — I5032 Chronic diastolic (congestive) heart failure: Secondary | ICD-10-CM | POA: Diagnosis not present

## 2015-07-25 DIAGNOSIS — Z7901 Long term (current) use of anticoagulants: Secondary | ICD-10-CM | POA: Diagnosis not present

## 2015-07-29 ENCOUNTER — Telehealth: Payer: Self-pay | Admitting: Internal Medicine

## 2015-07-29 NOTE — Telephone Encounter (Signed)
Spoke with pts daughter. She will come by the office on 07/30/15 to pick up RX for foot brace to take to [facility]

## 2015-07-29 NOTE — Telephone Encounter (Signed)
For rehab on her L side.  Pt had a stroke on Nov 24.  [name] told [name] that Wendy Hopkins's PCP needs to do an order for an ankle & foot brace to stabilize her foot for walking.  Please call Lenna Sciara if you have any questions at [phone].  Also, they mentioned a Education officer, museum (??)  They have not heard back from the social worker because she had some family issues herself she was dealing with.  Also, I did go into pt's chart and found her DPR she signed in 2012 and noted it in an [place] so it is clearly visible when you go into her chart that Lenna Sciara can be contacted regarding pt.  jsl

## 2015-07-29 NOTE — Telephone Encounter (Signed)
Pt's Daughter, Alric Quan, [place], came by and I scanned [place] into pt's chart.  [name] wanted you to know that Ms. Barra is at [facility]

## 2015-07-29 NOTE — Telephone Encounter (Signed)
Ok - can give a prescription - they will need to take it to medical supply store

## 2015-07-30 ENCOUNTER — Ambulatory Visit: Payer: Self-pay | Admitting: General Practice

## 2015-08-06 ENCOUNTER — Ambulatory Visit: Payer: Medicare Other | Admitting: Podiatry

## 2015-08-06 ENCOUNTER — Encounter: Payer: Self-pay | Admitting: *Deleted

## 2015-08-06 ENCOUNTER — Other Ambulatory Visit: Payer: Self-pay | Admitting: *Deleted

## 2015-08-06 NOTE — Patient Outreach (Signed)
Wendy Hopkins) Care Management  08/06/2015  Wendy Hopkins May 02, 1926 [id number]   CSW was able to make initial contact with patient's daughter/HealthCare Power of Wendy Hopkins, Wendy Hopkins today to perform phone assessment, as well as assess and assist with discharge planning needs and services for patient.  CSW introduced self, explained role and types of services provided through [name].  CSW further explained to [name] that Wendy Hopkins works with patient's RNCM, Wendy Hopkins, also with [name]. CSW then explained the reason for the call, indicating that Wendy Hopkins Liaison with [place] Management thought that patient would benefit from social work services and resources to assist with discharge planning needs from Wendy Hopkins in [name], [name] where patient currently resides to receive short-term rehabilitative services.  CSW obtained two HIPAA compliant identifiers from [name], which included patient's name and Hopkins of birth.  [name] admits to receiving a call from CSW's colleague, Wendy Hopkins during CSW's unexpected absence.  [name] also remembers having briefly spoken with CSW on the evening of December 6th, needing to cancel the scheduled appointment with patient and family due to a family emergency.  [name] was very understanding during CSW's loss and took it upon herself to make discharge arrangements for patient through the social worker at [name], pending CSW's return to the office.  [name] went on to say that she has already gotten patient's discharge arrangements worked out with Wendy Hopkins at [employer] and that no further social work involvement would be necessary at this time.  [name] last explained to CSW that patient will be discharging to her home one day next week and that home health services and durable medical  equipment has already been arranged. CSW will perform a case closure on patient, as all goals of treatment have been met from social work standpoint and no additional social work needs have been identified at this time. CSW will notify patient's RNCM with [place] Management, Wendy Hopkins of CSW's plans to close patient's case. CSW will fax a correspondence letter to patient's Primary Care Physician, Wendy Hopkins to ensure that Wendy Hopkins is aware of CSW's involvement with patient. CSW will submit a case closure request to Wendy Hopkins, Care Management Assistant with [place] Management, in the form of an [employer].  CSW will ensure that Wendy Hopkins is aware of Wendy Hopkins, RNCM with [name], continued involvement with patient's care.  Wendy Hopkins, BSW, MSW, LCSW  Licensed Education officer, [facility]  Mailing [place] [address], [place], [place] [address] Physical Address-[address], [place], [place] [address] Toll Free Main # [phone] Fax # [phone] Cell # [phone]  Fax # [phone]  [email or web address]

## 2015-08-11 DIAGNOSIS — I4891 Unspecified atrial fibrillation: Secondary | ICD-10-CM | POA: Diagnosis not present

## 2015-08-11 DIAGNOSIS — Z7901 Long term (current) use of anticoagulants: Secondary | ICD-10-CM | POA: Diagnosis not present

## 2015-08-11 DIAGNOSIS — E119 Type 2 diabetes mellitus without complications: Secondary | ICD-10-CM | POA: Diagnosis not present

## 2015-08-15 DIAGNOSIS — N390 Urinary tract infection, site not specified: Secondary | ICD-10-CM | POA: Diagnosis not present

## 2015-08-15 DIAGNOSIS — I5032 Chronic diastolic (congestive) heart failure: Secondary | ICD-10-CM | POA: Diagnosis not present

## 2015-08-15 DIAGNOSIS — I4891 Unspecified atrial fibrillation: Secondary | ICD-10-CM | POA: Diagnosis not present

## 2015-08-15 DIAGNOSIS — Z7901 Long term (current) use of anticoagulants: Secondary | ICD-10-CM | POA: Diagnosis not present

## 2015-08-22 DIAGNOSIS — E162 Hypoglycemia, unspecified: Secondary | ICD-10-CM | POA: Diagnosis not present

## 2015-08-22 DIAGNOSIS — R609 Edema, unspecified: Secondary | ICD-10-CM | POA: Diagnosis not present

## 2015-08-26 ENCOUNTER — Ambulatory Visit: Payer: Medicare Other | Admitting: Internal Medicine

## 2015-08-27 DIAGNOSIS — E162 Hypoglycemia, unspecified: Secondary | ICD-10-CM | POA: Diagnosis not present

## 2015-08-27 DIAGNOSIS — Z7901 Long term (current) use of anticoagulants: Secondary | ICD-10-CM | POA: Diagnosis not present

## 2015-09-03 ENCOUNTER — Telehealth: Payer: Self-pay | Admitting: Internal Medicine

## 2015-09-03 NOTE — Telephone Encounter (Signed)
Will forward to Dr. Caryl Comes to review- he has not seen the patient since 07/2014. Last INR was 1.23- 07/09/15- checked in hospital. The patient had a follow up appointment scheduled with Dr. Caryl Comes on 08/26/15, but her daughter cancelled the appointment since the patient is currently at [name] rehab.

## 2015-09-03 NOTE — Telephone Encounter (Signed)
New message     Pt is at [facility] rehab and will soon be transferred to [facility] facility.  She is on coumadin.  They want to know if pt can be put on xarelto instead of coumadin. If yes, please call [facility] rehab at [phone] and also let daughter know.  Her 100 days at [facility] will end Thursday.  She will have to be moved to [facility] or family start paying.

## 2015-09-03 NOTE — Telephone Encounter (Signed)
Ok to change anticoagulant   I would suggest be done by PCP if we are not going to be seeing her ongoingly   Her dose would be 15

## 2015-09-04 ENCOUNTER — Telehealth: Payer: Self-pay | Admitting: Internal Medicine

## 2015-09-04 NOTE — Telephone Encounter (Signed)
Spoke with nurse from [facility] to inform of change. Pts daughter to inform.

## 2015-09-04 NOTE — Telephone Encounter (Signed)
Ok to switch. She will be establishing with a new pcp at the new assisted living facility

## 2015-09-04 NOTE — Telephone Encounter (Signed)
Notified the pts daughter that per Dr Caryl Comes, its ok for the pt to change anticoagulant, however he suggest this be done by the pts PCP, if we are not going to be seeing her continuously, and he recommends that PCP make her dose at 15 mg.  Per the daughter, she verbalized understanding and completely agrees with this plan, for her PCP had previously been following her for coumadin and they will follow her at [facility] as well.  Daughter very gracious for all the assistance provided, and states she will contact the pts PCP now with Dr Olin Pia recommendations.

## 2015-09-04 NOTE — Telephone Encounter (Signed)
Follow Up   Please call back to discuss switching the medications with the [facility] rehab at [phone]. Then the daughter is requesting a call back to determine if it was done.

## 2015-09-04 NOTE — Telephone Encounter (Signed)
Please advise. We have not seen this pt yet.

## 2015-09-04 NOTE — Telephone Encounter (Signed)
States patient is going into [employer].  Is requesting a call to go to [facility] in regards to changing warfarin to xarelto '15mg'$ .  Please see phone note 1/24 with Dr. Caryl Comes.  Daughter is requesting call in regards to confirm this has been done.

## 2015-09-05 DIAGNOSIS — I509 Heart failure, unspecified: Secondary | ICD-10-CM | POA: Diagnosis not present

## 2015-09-05 DIAGNOSIS — Z7901 Long term (current) use of anticoagulants: Secondary | ICD-10-CM | POA: Diagnosis not present

## 2015-09-05 DIAGNOSIS — E119 Type 2 diabetes mellitus without complications: Secondary | ICD-10-CM | POA: Diagnosis not present

## 2015-09-05 DIAGNOSIS — I639 Cerebral infarction, unspecified: Secondary | ICD-10-CM | POA: Diagnosis not present

## 2015-09-05 DIAGNOSIS — D509 Iron deficiency anemia, unspecified: Secondary | ICD-10-CM | POA: Diagnosis not present

## 2015-09-07 ENCOUNTER — Telehealth: Payer: Self-pay

## 2015-09-07 NOTE — Telephone Encounter (Signed)
Received call from on call nurse, Tillie Rung cb # [phone] regarding pt. States CBG reading was at 404 as of 7AM was given 8 units of humalog , 30 minutes later reading was 393. The time the call was received was 10:15 AM during Saturday clinic, the reading was then 378. Spoke with on call MD. Dr. Elease Hashimoto and was instructed to have pt get another 6 units of humalog and also be sure is taking Tojeou as prescribed as well which is on med list. Call was returned to [facility] with this instruction

## 2015-09-10 DIAGNOSIS — D509 Iron deficiency anemia, unspecified: Secondary | ICD-10-CM | POA: Diagnosis not present

## 2015-09-10 DIAGNOSIS — C3490 Malignant neoplasm of unspecified part of unspecified bronchus or lung: Secondary | ICD-10-CM | POA: Diagnosis not present

## 2015-09-10 DIAGNOSIS — E119 Type 2 diabetes mellitus without complications: Secondary | ICD-10-CM | POA: Diagnosis not present

## 2015-09-10 DIAGNOSIS — I4891 Unspecified atrial fibrillation: Secondary | ICD-10-CM | POA: Diagnosis not present

## 2015-09-10 DIAGNOSIS — I5032 Chronic diastolic (congestive) heart failure: Secondary | ICD-10-CM | POA: Diagnosis not present

## 2015-09-10 DIAGNOSIS — Z8673 Personal history of transient ischemic attack (TIA), and cerebral infarction without residual deficits: Secondary | ICD-10-CM | POA: Diagnosis not present

## 2015-09-10 DIAGNOSIS — E785 Hyperlipidemia, unspecified: Secondary | ICD-10-CM | POA: Diagnosis not present

## 2015-09-13 DIAGNOSIS — I110 Hypertensive heart disease with heart failure: Secondary | ICD-10-CM | POA: Diagnosis not present

## 2015-09-13 DIAGNOSIS — M6281 Muscle weakness (generalized): Secondary | ICD-10-CM | POA: Diagnosis not present

## 2015-09-13 DIAGNOSIS — I69398 Other sequelae of cerebral infarction: Secondary | ICD-10-CM | POA: Diagnosis not present

## 2015-09-13 DIAGNOSIS — R2681 Unsteadiness on feet: Secondary | ICD-10-CM | POA: Diagnosis not present

## 2015-09-17 DIAGNOSIS — I110 Hypertensive heart disease with heart failure: Secondary | ICD-10-CM | POA: Diagnosis not present

## 2015-09-17 DIAGNOSIS — W1839XA Other fall on same level, initial encounter: Secondary | ICD-10-CM | POA: Diagnosis not present

## 2015-09-17 DIAGNOSIS — I69398 Other sequelae of cerebral infarction: Secondary | ICD-10-CM | POA: Diagnosis not present

## 2015-09-17 DIAGNOSIS — E119 Type 2 diabetes mellitus without complications: Secondary | ICD-10-CM | POA: Diagnosis not present

## 2015-09-17 DIAGNOSIS — M6281 Muscle weakness (generalized): Secondary | ICD-10-CM | POA: Diagnosis not present

## 2015-09-17 DIAGNOSIS — R2681 Unsteadiness on feet: Secondary | ICD-10-CM | POA: Diagnosis not present

## 2015-09-18 DIAGNOSIS — I110 Hypertensive heart disease with heart failure: Secondary | ICD-10-CM | POA: Diagnosis not present

## 2015-09-18 DIAGNOSIS — M6281 Muscle weakness (generalized): Secondary | ICD-10-CM | POA: Diagnosis not present

## 2015-09-18 DIAGNOSIS — R2681 Unsteadiness on feet: Secondary | ICD-10-CM | POA: Diagnosis not present

## 2015-09-18 DIAGNOSIS — I69398 Other sequelae of cerebral infarction: Secondary | ICD-10-CM | POA: Diagnosis not present

## 2015-09-19 DIAGNOSIS — R2681 Unsteadiness on feet: Secondary | ICD-10-CM | POA: Diagnosis not present

## 2015-09-19 DIAGNOSIS — I110 Hypertensive heart disease with heart failure: Secondary | ICD-10-CM | POA: Diagnosis not present

## 2015-09-19 DIAGNOSIS — M6281 Muscle weakness (generalized): Secondary | ICD-10-CM | POA: Diagnosis not present

## 2015-09-19 DIAGNOSIS — I69398 Other sequelae of cerebral infarction: Secondary | ICD-10-CM | POA: Diagnosis not present

## 2015-09-23 ENCOUNTER — Ambulatory Visit (INDEPENDENT_AMBULATORY_CARE_PROVIDER_SITE_OTHER): Payer: Medicare Other | Admitting: Neurology

## 2015-09-23 ENCOUNTER — Encounter: Payer: Self-pay | Admitting: Neurology

## 2015-09-23 VITALS — BP 129/76 | HR 76 | Ht 60.0 in

## 2015-09-23 DIAGNOSIS — I63131 Cerebral infarction due to embolism of right carotid artery: Secondary | ICD-10-CM | POA: Diagnosis not present

## 2015-09-23 NOTE — Progress Notes (Signed)
[facility] [address] [place]. [place] [address] [phone]       OFFICE FOLLOW-UP NOTE  Wendy Hopkins Date of Birth:  [birth year] Medical Record Number:  [id number]   HPI: 29 year Caucasian lady seen today for the first office follow-up visit following hospital admission for stroke in November 2016. Wendy Hopkins is a 82 y.o. female with a history of CHF, afib on coumadin. She was in her normal state of health when she went to lay down on a couch at 9 PM 07/05/2015. When she went to arise at 10:45 PM, she noticed that she was having trouble standing. She did not notice the symptoms until she attempted to stand. She has been on Coumadin for atrial fibrillation and was recently in [place] home for rehabilitation following a broken hip. She had only been home for a few days and was looking forward to celebrating her 89th birthday on Saturday. She denies numbness. She denies visual change. She denies nausea, vomiting. tpa was given. She was admitted for further evaluation and treatment. She was supposed to be on Coumadin but INR was only 1.21 admission she met criteria for IV tPA which was administered uneventfully. MRI scan of the brain was obtained subsequently which showed patchy right MCA branch infarcts. Transthoracic echo showed normal ejection fraction. Carotid ultrasound showed no significant extracranial stenosis. MRA of the brain showed right distal M2 occlusion but no other large vessel stenosis. LDL cholesterol was 81 mg percent and hemoglobin A1c was 7.6. Patient had mild left facial droop and mild left hemiparesis. She was initially evaluated for inpatient rehabilitation but was not felt to be a good candidate and she was transferred to skilled nursing facility for rehabilitation. Patient is working with therapist there but gait is not able to walk without his assistance. She is also has significant right knee pain which also seems to limit her ability  to walk. She has subsequently been switched to Xarelto which is tolerating well without bleeding or bruising. Her daughter states that his blood pressure is doing well and today it is 129/70. The daughter seems upset because the patient was admitted in October to the hospital for fractured pelvis following a fall and she was not discharged to SNF on 05/31/2015 on warfarin which the daughter apparently noticed in the nursing home and just got started at the time of discharge from nursing home to home and patient was on warfarin for 4-5 days only and hence at the time of admission the INR was suboptimal and 1.22 when she came in with stroke. I explained to her that this was probably an inadvertent omission at the time of discharge and I spoke to the medical hospitalist involved and communicated this to the patient's daughter Wendy Hopkins. ROS:   14 system review of systems is positive for walking difficulty, weakness, balance difficulties and all other systems negative  PMH:  Past Medical History  Diagnosis Date  . Arthritis   . Hyperlipidemia   . 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 Closed fracture of unspecified part of femur 2008  . TIA (transient ischemic attack)   . Edema   . Chronic diastolic heart failure ([place])   . Osteoporosis with fracture     compression fx lower T spine and t4, L rib  . Thyroid nodule     dominate R nodule, s/p aspiration>abn path>6 mo obs planned ([place] 06/01/11)  . GERD (gastroesophageal reflux disease)   .  Hypertension 2006  . Atrial fibrillation ([facility])     chronic anticoag; and AFlutter  . History of radiation therapy 11/30/2012-12/09/2012    50 gray to right chest  . Vertebral compression fracture [facility])     Thoracic 12 and Thoracic 7  . Adenocarcinoma, lung ([place]) dx 11/08/12    s/p CT chest bx - XRT thru 12/2012  . Diabetes mellitus, type II, insulin dependent ([place])     over 40 years ago  . Small vessel disease, cerebrovascular  05/15/2015  . Cerebral atrophy 05/15/2015  . Moderate to severe pulmonary hypertension ([place]) 05/15/2015  . Severe tricuspid regurgitation 05/15/2015  . Stroke [name])     Social History:  Social History   Social History  . Marital Status: Married    Spouse Name: N/A  . Number of Children: 4  . Years of Education: N/A   Occupational History  . retired     Insurance account manager   Social History Main Topics  . Smoking status: Never Smoker   . Smokeless tobacco: Never Used  . Alcohol Use: No  . Drug Use: No  . Sexual Activity: Not Currently   Other Topics Concern  . Not on file   Social History Narrative    Medications:   Current Outpatient Prescriptions on File Prior to Visit  Medication Sig Dispense Refill  . acetaminophen (TYLENOL) 500 MG tablet Take 500 mg by mouth every 6 (six) hours as needed for mild pain.     . cholecalciferol (VITAMIN D) 1000 UNITS tablet Take 1,000 Units by mouth daily.     . furosemide (LASIX) 40 MG tablet Take 1 tablet (40 mg total) by mouth daily. 60 tablet 1  . insulin aspart (NOVOLOG) 100 UNIT/ML injection Inject 0-4 Units into the skin 4 (four) times daily as needed for high blood sugar. Sliding scale    . Insulin Glargine (TOUJEO SOLOSTAR) 300 UNIT/ML SOPN Inject 5 Units into the skin daily. (Patient taking differently: Inject 6 Units into the skin daily. )    . polyethylene glycol (MIRALAX / GLYCOLAX) packet Take 17 g by mouth daily. 14 each 0  . spironolactone (ALDACTONE) 25 MG tablet Take 25 mg by mouth daily.    Marland Kitchen warfarin (COUMADIN) 5 MG tablet Take 2.5-5 mg by mouth See admin instructions. 5 mg every day except on Friday patient takes 2.5 mg     No current facility-administered medications on file prior to visit.    Allergies:   Allergies  Allergen Reactions  . Codeine Nausea And Vomiting         Physical Exam General: well developed, well nourished, seated, in no evident distress Head: head normocephalic and atraumatic.  Neck: supple with no  carotid or supraclavicular bruits Cardiovascular: regular rate and rhythm, no murmurs Musculoskeletal: no deformity Skin:  no rash/petichiae Vascular:  Normal pulses all extremities Filed Vitals:   09/23/15 1309  BP: 129/76  Pulse: 76   Neurologic Exam Mental Status: Awake and fully alert. Oriented to place and time. Recent and remote memory intact. Attention span, concentration and fund of knowledge appropriate. Mood and affect appropriate.  Cranial Nerves: Fundoscopic exam reveals sharp disc margins. Pupils equal, briskly reactive to light. Extraocular movements full without nystagmus. Visual fields full to confrontation. Hearing intact. Facial sensation intact. Face, tongue, palate moves normally and symmetrically.  Motor: Normal strength tone on the right side with mild left hemiparesis with weakness mainly of the left grip and intrinsic hand muscles. Mild weakness of left hip flexors and  ankle dorsiflexors. Sensory.: intact to touch ,pinprick .position and vibratory sensation.  Coordination: Slightly impaired coordination on the left compared to the right Gait and Station: Deferred as patient is in wheelchair and requires one-person assist. Reflexes: 1+ and symmetric. Toes downgoing.   NIHSS 3 Modified Rankin  4   ASSESSMENT: 43 year Caucasian lady is with a right MCA branch embolic infarct from atrial fibrillation in November 2016 secondary to suboptimal anti-coagulation on warfarin treated with IV TPA with modest improvement.    PLAN: I had a long d/w patient and her daughter about her recent stroke, risk for recurrent stroke/TIAs, personally independently reviewed imaging studies and stroke evaluation results and answered questions.Continue xarelto for atrial fibrillation  for secondary stroke prevention and maintain strict control of hypertension with blood pressure goal below 130/90, diabetes with hemoglobin A1c goal below 6.5% and lipids with LDL cholesterol goal below 70  mg/dL.  I also advised the patient fall and safety precautions. Continue ongoing physical occupational therapy for gait and balance training.Greater than 50% of time during this 25 minute visit was spent on counseling,explanation of diagnosis, planning of further management, discussion with patient and family and coordination of care Followup in the future with me in 6 months or call earlier if necessary. Wendy Contras, MD   Note: This document was prepared with digital dictation and possible smart phrase technology. Any transcriptional errors that result from this process are unintentional

## 2015-09-23 NOTE — Patient Instructions (Signed)
I had a long d/w patient and her daughter about her recent stroke, risk for recurrent stroke/TIAs, personally independently reviewed imaging studies and stroke evaluation results and answered questions.Continue xarelto for atrial fibrillation  for secondary stroke prevention and maintain strict control of hypertension with blood pressure goal below 130/90, diabetes with hemoglobin A1c goal below 6.5% and lipids with LDL cholesterol goal below 70 mg/dL.  I also advised the patient fall and safety precautions. Continue ongoing physical occupational therapy for gait and balance training. Followup in the future with me in 6 months or call earlier if necessary. Fall Prevention in Hospitals, Adult As a hospital patient, your condition and the treatments you receive can increase your risk for falls. Some additional risk factors for falls in a hospital include:  Being in an unfamiliar environment.  Being on bed rest.  Your surgery.  Taking certain medicines.  Your tubing requirements, such as intravenous (IV) therapy or catheters. It is important that you learn how to decrease fall risks while at the hospital. Below are important tips that can help prevent falls. SAFETY TIPS FOR PREVENTING FALLS Talk about your risk of falling.  Ask your health care provider why you are at risk for falling. Is it your medicine, illness, tubing placement, or something else?  Make a plan with your health care provider to keep you safe from falls.  Ask your health care provider or pharmacist about side effects of your medicines. Some medicines can make you dizzy or affect your coordination. Ask for help.  Ask for help before getting out of bed. You may need to press your call button.  Ask for assistance in getting safely to the toilet.  Ask for a walker or cane to be put at your bedside. Ask that most of the side rails on your bed be placed up before your health care provider leaves the room.  Ask family or  friends to sit with you.  Ask for things that are out of your reach, such as your glasses, hearing aids, telephone, bedside table, or call button. Follow these tips to avoid falling:  Stay lying or seated, rather than standing, while waiting for help.  Wear rubber-soled slippers or shoes whenever you walk in the hospital.  Avoid quick, sudden movements.  Change positions slowly.  Sit on the side of your bed before standing.  Stand up slowly and wait before you start to walk.  Let your health care provider know if there is a spill on the floor.  Pay careful attention to the medical equipment, electrical cords, and tubes around you.  When you need help, use your call button by your bed or in the bathroom. Wait for one of your health care providers to help you.  If you feel dizzy or unsure of your footing, return to bed and wait for assistance.  Avoid being distracted by the TV, telephone, or another person in your room.  Do not lean or support yourself on rolling objects, such as IV poles or bedside tables.   This information is not intended to replace advice given to you by your health care provider. Make sure you discuss any questions you have with your health care provider.   Document Released: 07/24/2000 Document Revised: 08/17/2014 Document Reviewed: 04/03/2012 Elsevier Interactive Patient Education [employer].

## 2015-09-24 DIAGNOSIS — M6281 Muscle weakness (generalized): Secondary | ICD-10-CM | POA: Diagnosis not present

## 2015-09-24 DIAGNOSIS — R1084 Generalized abdominal pain: Secondary | ICD-10-CM | POA: Diagnosis not present

## 2015-09-24 DIAGNOSIS — R2681 Unsteadiness on feet: Secondary | ICD-10-CM | POA: Diagnosis not present

## 2015-09-24 DIAGNOSIS — E119 Type 2 diabetes mellitus without complications: Secondary | ICD-10-CM | POA: Diagnosis not present

## 2015-09-24 DIAGNOSIS — I110 Hypertensive heart disease with heart failure: Secondary | ICD-10-CM | POA: Diagnosis not present

## 2015-09-24 DIAGNOSIS — I69398 Other sequelae of cerebral infarction: Secondary | ICD-10-CM | POA: Diagnosis not present

## 2015-09-24 DIAGNOSIS — I10 Essential (primary) hypertension: Secondary | ICD-10-CM | POA: Diagnosis not present

## 2015-09-25 DIAGNOSIS — I69398 Other sequelae of cerebral infarction: Secondary | ICD-10-CM | POA: Diagnosis not present

## 2015-09-25 DIAGNOSIS — M6281 Muscle weakness (generalized): Secondary | ICD-10-CM | POA: Diagnosis not present

## 2015-09-25 DIAGNOSIS — I110 Hypertensive heart disease with heart failure: Secondary | ICD-10-CM | POA: Diagnosis not present

## 2015-09-25 DIAGNOSIS — R2681 Unsteadiness on feet: Secondary | ICD-10-CM | POA: Diagnosis not present

## 2015-09-26 DIAGNOSIS — M6281 Muscle weakness (generalized): Secondary | ICD-10-CM | POA: Diagnosis not present

## 2015-09-26 DIAGNOSIS — I110 Hypertensive heart disease with heart failure: Secondary | ICD-10-CM | POA: Diagnosis not present

## 2015-09-26 DIAGNOSIS — R2681 Unsteadiness on feet: Secondary | ICD-10-CM | POA: Diagnosis not present

## 2015-09-26 DIAGNOSIS — I69398 Other sequelae of cerebral infarction: Secondary | ICD-10-CM | POA: Diagnosis not present

## 2015-09-27 DIAGNOSIS — I69398 Other sequelae of cerebral infarction: Secondary | ICD-10-CM | POA: Diagnosis not present

## 2015-09-27 DIAGNOSIS — R2681 Unsteadiness on feet: Secondary | ICD-10-CM | POA: Diagnosis not present

## 2015-09-27 DIAGNOSIS — I110 Hypertensive heart disease with heart failure: Secondary | ICD-10-CM | POA: Diagnosis not present

## 2015-09-27 DIAGNOSIS — M6281 Muscle weakness (generalized): Secondary | ICD-10-CM | POA: Diagnosis not present

## 2015-10-01 DIAGNOSIS — I110 Hypertensive heart disease with heart failure: Secondary | ICD-10-CM | POA: Diagnosis not present

## 2015-10-01 DIAGNOSIS — M6281 Muscle weakness (generalized): Secondary | ICD-10-CM | POA: Diagnosis not present

## 2015-10-01 DIAGNOSIS — E119 Type 2 diabetes mellitus without complications: Secondary | ICD-10-CM | POA: Diagnosis not present

## 2015-10-01 DIAGNOSIS — I69398 Other sequelae of cerebral infarction: Secondary | ICD-10-CM | POA: Diagnosis not present

## 2015-10-01 DIAGNOSIS — R2681 Unsteadiness on feet: Secondary | ICD-10-CM | POA: Diagnosis not present

## 2015-10-01 DIAGNOSIS — R600 Localized edema: Secondary | ICD-10-CM | POA: Diagnosis not present

## 2015-10-03 DIAGNOSIS — I69398 Other sequelae of cerebral infarction: Secondary | ICD-10-CM | POA: Diagnosis not present

## 2015-10-03 DIAGNOSIS — M6281 Muscle weakness (generalized): Secondary | ICD-10-CM | POA: Diagnosis not present

## 2015-10-03 DIAGNOSIS — R2681 Unsteadiness on feet: Secondary | ICD-10-CM | POA: Diagnosis not present

## 2015-10-03 DIAGNOSIS — I110 Hypertensive heart disease with heart failure: Secondary | ICD-10-CM | POA: Diagnosis not present

## 2015-10-04 DIAGNOSIS — I69398 Other sequelae of cerebral infarction: Secondary | ICD-10-CM | POA: Diagnosis not present

## 2015-10-04 DIAGNOSIS — M6281 Muscle weakness (generalized): Secondary | ICD-10-CM | POA: Diagnosis not present

## 2015-10-04 DIAGNOSIS — R2681 Unsteadiness on feet: Secondary | ICD-10-CM | POA: Diagnosis not present

## 2015-10-04 DIAGNOSIS — I110 Hypertensive heart disease with heart failure: Secondary | ICD-10-CM | POA: Diagnosis not present

## 2015-10-06 DIAGNOSIS — E559 Vitamin D deficiency, unspecified: Secondary | ICD-10-CM | POA: Diagnosis not present

## 2015-10-06 DIAGNOSIS — I63139 Cerebral infarction due to embolism of unspecified carotid artery: Secondary | ICD-10-CM | POA: Diagnosis not present

## 2015-10-08 DIAGNOSIS — E785 Hyperlipidemia, unspecified: Secondary | ICD-10-CM | POA: Diagnosis not present

## 2015-10-08 DIAGNOSIS — M1990 Unspecified osteoarthritis, unspecified site: Secondary | ICD-10-CM | POA: Diagnosis not present

## 2015-10-08 DIAGNOSIS — C3490 Malignant neoplasm of unspecified part of unspecified bronchus or lung: Secondary | ICD-10-CM | POA: Diagnosis not present

## 2015-10-08 DIAGNOSIS — E1159 Type 2 diabetes mellitus with other circulatory complications: Secondary | ICD-10-CM | POA: Diagnosis not present

## 2015-10-08 DIAGNOSIS — I69398 Other sequelae of cerebral infarction: Secondary | ICD-10-CM | POA: Diagnosis not present

## 2015-10-08 DIAGNOSIS — M810 Age-related osteoporosis without current pathological fracture: Secondary | ICD-10-CM | POA: Diagnosis not present

## 2015-10-08 DIAGNOSIS — D509 Iron deficiency anemia, unspecified: Secondary | ICD-10-CM | POA: Diagnosis not present

## 2015-10-08 DIAGNOSIS — R1311 Dysphagia, oral phase: Secondary | ICD-10-CM | POA: Diagnosis not present

## 2015-10-08 DIAGNOSIS — I5032 Chronic diastolic (congestive) heart failure: Secondary | ICD-10-CM | POA: Diagnosis not present

## 2015-10-08 DIAGNOSIS — E119 Type 2 diabetes mellitus without complications: Secondary | ICD-10-CM | POA: Diagnosis not present

## 2015-10-08 DIAGNOSIS — I110 Hypertensive heart disease with heart failure: Secondary | ICD-10-CM | POA: Diagnosis not present

## 2015-10-08 DIAGNOSIS — I4891 Unspecified atrial fibrillation: Secondary | ICD-10-CM | POA: Diagnosis not present

## 2015-10-08 DIAGNOSIS — M6281 Muscle weakness (generalized): Secondary | ICD-10-CM | POA: Diagnosis not present

## 2015-10-08 DIAGNOSIS — Z8673 Personal history of transient ischemic attack (TIA), and cerebral infarction without residual deficits: Secondary | ICD-10-CM | POA: Diagnosis not present

## 2015-10-08 DIAGNOSIS — R2681 Unsteadiness on feet: Secondary | ICD-10-CM | POA: Diagnosis not present

## 2015-10-09 DIAGNOSIS — F330 Major depressive disorder, recurrent, mild: Secondary | ICD-10-CM | POA: Diagnosis not present

## 2015-10-10 DIAGNOSIS — C3490 Malignant neoplasm of unspecified part of unspecified bronchus or lung: Secondary | ICD-10-CM | POA: Diagnosis not present

## 2015-10-10 DIAGNOSIS — R2681 Unsteadiness on feet: Secondary | ICD-10-CM | POA: Diagnosis not present

## 2015-10-10 DIAGNOSIS — I4891 Unspecified atrial fibrillation: Secondary | ICD-10-CM | POA: Diagnosis not present

## 2015-10-10 DIAGNOSIS — M6281 Muscle weakness (generalized): Secondary | ICD-10-CM | POA: Diagnosis not present

## 2015-10-10 DIAGNOSIS — I110 Hypertensive heart disease with heart failure: Secondary | ICD-10-CM | POA: Diagnosis not present

## 2015-10-10 DIAGNOSIS — M810 Age-related osteoporosis without current pathological fracture: Secondary | ICD-10-CM | POA: Diagnosis not present

## 2015-10-10 DIAGNOSIS — M1990 Unspecified osteoarthritis, unspecified site: Secondary | ICD-10-CM | POA: Diagnosis not present

## 2015-10-10 DIAGNOSIS — E1159 Type 2 diabetes mellitus with other circulatory complications: Secondary | ICD-10-CM | POA: Diagnosis not present

## 2015-10-10 DIAGNOSIS — I5032 Chronic diastolic (congestive) heart failure: Secondary | ICD-10-CM | POA: Diagnosis not present

## 2015-10-10 DIAGNOSIS — R1311 Dysphagia, oral phase: Secondary | ICD-10-CM | POA: Diagnosis not present

## 2015-10-10 DIAGNOSIS — D509 Iron deficiency anemia, unspecified: Secondary | ICD-10-CM | POA: Diagnosis not present

## 2015-10-10 DIAGNOSIS — I69398 Other sequelae of cerebral infarction: Secondary | ICD-10-CM | POA: Diagnosis not present

## 2015-10-10 DIAGNOSIS — E785 Hyperlipidemia, unspecified: Secondary | ICD-10-CM | POA: Diagnosis not present

## 2015-10-11 DIAGNOSIS — R1311 Dysphagia, oral phase: Secondary | ICD-10-CM | POA: Diagnosis not present

## 2015-10-11 DIAGNOSIS — I69398 Other sequelae of cerebral infarction: Secondary | ICD-10-CM | POA: Diagnosis not present

## 2015-10-11 DIAGNOSIS — I110 Hypertensive heart disease with heart failure: Secondary | ICD-10-CM | POA: Diagnosis not present

## 2015-10-11 DIAGNOSIS — E785 Hyperlipidemia, unspecified: Secondary | ICD-10-CM | POA: Diagnosis not present

## 2015-10-11 DIAGNOSIS — M6281 Muscle weakness (generalized): Secondary | ICD-10-CM | POA: Diagnosis not present

## 2015-10-11 DIAGNOSIS — M810 Age-related osteoporosis without current pathological fracture: Secondary | ICD-10-CM | POA: Diagnosis not present

## 2015-10-11 DIAGNOSIS — R2681 Unsteadiness on feet: Secondary | ICD-10-CM | POA: Diagnosis not present

## 2015-10-11 DIAGNOSIS — I4891 Unspecified atrial fibrillation: Secondary | ICD-10-CM | POA: Diagnosis not present

## 2015-10-11 DIAGNOSIS — E1159 Type 2 diabetes mellitus with other circulatory complications: Secondary | ICD-10-CM | POA: Diagnosis not present

## 2015-10-11 DIAGNOSIS — C3490 Malignant neoplasm of unspecified part of unspecified bronchus or lung: Secondary | ICD-10-CM | POA: Diagnosis not present

## 2015-10-11 DIAGNOSIS — I5032 Chronic diastolic (congestive) heart failure: Secondary | ICD-10-CM | POA: Diagnosis not present

## 2015-10-11 DIAGNOSIS — M1990 Unspecified osteoarthritis, unspecified site: Secondary | ICD-10-CM | POA: Diagnosis not present

## 2015-10-11 DIAGNOSIS — D509 Iron deficiency anemia, unspecified: Secondary | ICD-10-CM | POA: Diagnosis not present

## 2015-10-11 NOTE — Patient Outreach (Signed)
[facility]) Care Management  10/11/2015  SYEDA PRICKETT 1926/08/06 [id number]   Per MD Visit note on 09/23/15,  mRS = 4

## 2015-10-14 DIAGNOSIS — I4891 Unspecified atrial fibrillation: Secondary | ICD-10-CM | POA: Diagnosis not present

## 2015-10-14 DIAGNOSIS — M6281 Muscle weakness (generalized): Secondary | ICD-10-CM | POA: Diagnosis not present

## 2015-10-14 DIAGNOSIS — M1990 Unspecified osteoarthritis, unspecified site: Secondary | ICD-10-CM | POA: Diagnosis not present

## 2015-10-14 DIAGNOSIS — R2681 Unsteadiness on feet: Secondary | ICD-10-CM | POA: Diagnosis not present

## 2015-10-14 DIAGNOSIS — D509 Iron deficiency anemia, unspecified: Secondary | ICD-10-CM | POA: Diagnosis not present

## 2015-10-14 DIAGNOSIS — I69398 Other sequelae of cerebral infarction: Secondary | ICD-10-CM | POA: Diagnosis not present

## 2015-10-14 DIAGNOSIS — I110 Hypertensive heart disease with heart failure: Secondary | ICD-10-CM | POA: Diagnosis not present

## 2015-10-14 DIAGNOSIS — C3490 Malignant neoplasm of unspecified part of unspecified bronchus or lung: Secondary | ICD-10-CM | POA: Diagnosis not present

## 2015-10-14 DIAGNOSIS — I5032 Chronic diastolic (congestive) heart failure: Secondary | ICD-10-CM | POA: Diagnosis not present

## 2015-10-14 DIAGNOSIS — E785 Hyperlipidemia, unspecified: Secondary | ICD-10-CM | POA: Diagnosis not present

## 2015-10-14 DIAGNOSIS — E1159 Type 2 diabetes mellitus with other circulatory complications: Secondary | ICD-10-CM | POA: Diagnosis not present

## 2015-10-14 DIAGNOSIS — M810 Age-related osteoporosis without current pathological fracture: Secondary | ICD-10-CM | POA: Diagnosis not present

## 2015-10-14 DIAGNOSIS — R1311 Dysphagia, oral phase: Secondary | ICD-10-CM | POA: Diagnosis not present

## 2015-10-15 DIAGNOSIS — D509 Iron deficiency anemia, unspecified: Secondary | ICD-10-CM | POA: Diagnosis not present

## 2015-10-15 DIAGNOSIS — R2681 Unsteadiness on feet: Secondary | ICD-10-CM | POA: Diagnosis not present

## 2015-10-15 DIAGNOSIS — I69398 Other sequelae of cerebral infarction: Secondary | ICD-10-CM | POA: Diagnosis not present

## 2015-10-15 DIAGNOSIS — I4891 Unspecified atrial fibrillation: Secondary | ICD-10-CM | POA: Diagnosis not present

## 2015-10-15 DIAGNOSIS — C3490 Malignant neoplasm of unspecified part of unspecified bronchus or lung: Secondary | ICD-10-CM | POA: Diagnosis not present

## 2015-10-15 DIAGNOSIS — R1311 Dysphagia, oral phase: Secondary | ICD-10-CM | POA: Diagnosis not present

## 2015-10-15 DIAGNOSIS — M6281 Muscle weakness (generalized): Secondary | ICD-10-CM | POA: Diagnosis not present

## 2015-10-15 DIAGNOSIS — I110 Hypertensive heart disease with heart failure: Secondary | ICD-10-CM | POA: Diagnosis not present

## 2015-10-15 DIAGNOSIS — E1159 Type 2 diabetes mellitus with other circulatory complications: Secondary | ICD-10-CM | POA: Diagnosis not present

## 2015-10-15 DIAGNOSIS — M1990 Unspecified osteoarthritis, unspecified site: Secondary | ICD-10-CM | POA: Diagnosis not present

## 2015-10-15 DIAGNOSIS — M810 Age-related osteoporosis without current pathological fracture: Secondary | ICD-10-CM | POA: Diagnosis not present

## 2015-10-15 DIAGNOSIS — E785 Hyperlipidemia, unspecified: Secondary | ICD-10-CM | POA: Diagnosis not present

## 2015-10-15 DIAGNOSIS — I5032 Chronic diastolic (congestive) heart failure: Secondary | ICD-10-CM | POA: Diagnosis not present

## 2015-10-17 DIAGNOSIS — R2681 Unsteadiness on feet: Secondary | ICD-10-CM | POA: Diagnosis not present

## 2015-10-17 DIAGNOSIS — I110 Hypertensive heart disease with heart failure: Secondary | ICD-10-CM | POA: Diagnosis not present

## 2015-10-17 DIAGNOSIS — M6281 Muscle weakness (generalized): Secondary | ICD-10-CM | POA: Diagnosis not present

## 2015-10-17 DIAGNOSIS — I4891 Unspecified atrial fibrillation: Secondary | ICD-10-CM | POA: Diagnosis not present

## 2015-10-17 DIAGNOSIS — E1159 Type 2 diabetes mellitus with other circulatory complications: Secondary | ICD-10-CM | POA: Diagnosis not present

## 2015-10-17 DIAGNOSIS — E785 Hyperlipidemia, unspecified: Secondary | ICD-10-CM | POA: Diagnosis not present

## 2015-10-17 DIAGNOSIS — M1990 Unspecified osteoarthritis, unspecified site: Secondary | ICD-10-CM | POA: Diagnosis not present

## 2015-10-17 DIAGNOSIS — M810 Age-related osteoporosis without current pathological fracture: Secondary | ICD-10-CM | POA: Diagnosis not present

## 2015-10-17 DIAGNOSIS — D509 Iron deficiency anemia, unspecified: Secondary | ICD-10-CM | POA: Diagnosis not present

## 2015-10-17 DIAGNOSIS — I69398 Other sequelae of cerebral infarction: Secondary | ICD-10-CM | POA: Diagnosis not present

## 2015-10-17 DIAGNOSIS — R1311 Dysphagia, oral phase: Secondary | ICD-10-CM | POA: Diagnosis not present

## 2015-10-17 DIAGNOSIS — C3490 Malignant neoplasm of unspecified part of unspecified bronchus or lung: Secondary | ICD-10-CM | POA: Diagnosis not present

## 2015-10-17 DIAGNOSIS — I5032 Chronic diastolic (congestive) heart failure: Secondary | ICD-10-CM | POA: Diagnosis not present

## 2015-10-18 DIAGNOSIS — I110 Hypertensive heart disease with heart failure: Secondary | ICD-10-CM | POA: Diagnosis not present

## 2015-10-18 DIAGNOSIS — M6281 Muscle weakness (generalized): Secondary | ICD-10-CM | POA: Diagnosis not present

## 2015-10-18 DIAGNOSIS — M1990 Unspecified osteoarthritis, unspecified site: Secondary | ICD-10-CM | POA: Diagnosis not present

## 2015-10-18 DIAGNOSIS — I4891 Unspecified atrial fibrillation: Secondary | ICD-10-CM | POA: Diagnosis not present

## 2015-10-18 DIAGNOSIS — I5032 Chronic diastolic (congestive) heart failure: Secondary | ICD-10-CM | POA: Diagnosis not present

## 2015-10-18 DIAGNOSIS — E785 Hyperlipidemia, unspecified: Secondary | ICD-10-CM | POA: Diagnosis not present

## 2015-10-18 DIAGNOSIS — I69398 Other sequelae of cerebral infarction: Secondary | ICD-10-CM | POA: Diagnosis not present

## 2015-10-18 DIAGNOSIS — E1159 Type 2 diabetes mellitus with other circulatory complications: Secondary | ICD-10-CM | POA: Diagnosis not present

## 2015-10-18 DIAGNOSIS — C3490 Malignant neoplasm of unspecified part of unspecified bronchus or lung: Secondary | ICD-10-CM | POA: Diagnosis not present

## 2015-10-18 DIAGNOSIS — D509 Iron deficiency anemia, unspecified: Secondary | ICD-10-CM | POA: Diagnosis not present

## 2015-10-18 DIAGNOSIS — M810 Age-related osteoporosis without current pathological fracture: Secondary | ICD-10-CM | POA: Diagnosis not present

## 2015-10-18 DIAGNOSIS — R2681 Unsteadiness on feet: Secondary | ICD-10-CM | POA: Diagnosis not present

## 2015-10-18 DIAGNOSIS — R1311 Dysphagia, oral phase: Secondary | ICD-10-CM | POA: Diagnosis not present

## 2015-10-21 DIAGNOSIS — R2681 Unsteadiness on feet: Secondary | ICD-10-CM | POA: Diagnosis not present

## 2015-10-21 DIAGNOSIS — E1159 Type 2 diabetes mellitus with other circulatory complications: Secondary | ICD-10-CM | POA: Diagnosis not present

## 2015-10-21 DIAGNOSIS — I69398 Other sequelae of cerebral infarction: Secondary | ICD-10-CM | POA: Diagnosis not present

## 2015-10-21 DIAGNOSIS — M1990 Unspecified osteoarthritis, unspecified site: Secondary | ICD-10-CM | POA: Diagnosis not present

## 2015-10-21 DIAGNOSIS — M810 Age-related osteoporosis without current pathological fracture: Secondary | ICD-10-CM | POA: Diagnosis not present

## 2015-10-21 DIAGNOSIS — C3490 Malignant neoplasm of unspecified part of unspecified bronchus or lung: Secondary | ICD-10-CM | POA: Diagnosis not present

## 2015-10-21 DIAGNOSIS — I5032 Chronic diastolic (congestive) heart failure: Secondary | ICD-10-CM | POA: Diagnosis not present

## 2015-10-21 DIAGNOSIS — R1311 Dysphagia, oral phase: Secondary | ICD-10-CM | POA: Diagnosis not present

## 2015-10-21 DIAGNOSIS — I4891 Unspecified atrial fibrillation: Secondary | ICD-10-CM | POA: Diagnosis not present

## 2015-10-21 DIAGNOSIS — M6281 Muscle weakness (generalized): Secondary | ICD-10-CM | POA: Diagnosis not present

## 2015-10-21 DIAGNOSIS — I110 Hypertensive heart disease with heart failure: Secondary | ICD-10-CM | POA: Diagnosis not present

## 2015-10-21 DIAGNOSIS — E785 Hyperlipidemia, unspecified: Secondary | ICD-10-CM | POA: Diagnosis not present

## 2015-10-21 DIAGNOSIS — D509 Iron deficiency anemia, unspecified: Secondary | ICD-10-CM | POA: Diagnosis not present

## 2015-10-22 DIAGNOSIS — E785 Hyperlipidemia, unspecified: Secondary | ICD-10-CM | POA: Diagnosis not present

## 2015-10-22 DIAGNOSIS — M6281 Muscle weakness (generalized): Secondary | ICD-10-CM | POA: Diagnosis not present

## 2015-10-22 DIAGNOSIS — I110 Hypertensive heart disease with heart failure: Secondary | ICD-10-CM | POA: Diagnosis not present

## 2015-10-22 DIAGNOSIS — M1990 Unspecified osteoarthritis, unspecified site: Secondary | ICD-10-CM | POA: Diagnosis not present

## 2015-10-22 DIAGNOSIS — C3490 Malignant neoplasm of unspecified part of unspecified bronchus or lung: Secondary | ICD-10-CM | POA: Diagnosis not present

## 2015-10-22 DIAGNOSIS — M810 Age-related osteoporosis without current pathological fracture: Secondary | ICD-10-CM | POA: Diagnosis not present

## 2015-10-22 DIAGNOSIS — E1159 Type 2 diabetes mellitus with other circulatory complications: Secondary | ICD-10-CM | POA: Diagnosis not present

## 2015-10-22 DIAGNOSIS — E119 Type 2 diabetes mellitus without complications: Secondary | ICD-10-CM | POA: Diagnosis not present

## 2015-10-22 DIAGNOSIS — R1311 Dysphagia, oral phase: Secondary | ICD-10-CM | POA: Diagnosis not present

## 2015-10-22 DIAGNOSIS — I69398 Other sequelae of cerebral infarction: Secondary | ICD-10-CM | POA: Diagnosis not present

## 2015-10-22 DIAGNOSIS — D509 Iron deficiency anemia, unspecified: Secondary | ICD-10-CM | POA: Diagnosis not present

## 2015-10-22 DIAGNOSIS — R2681 Unsteadiness on feet: Secondary | ICD-10-CM | POA: Diagnosis not present

## 2015-10-22 DIAGNOSIS — I4891 Unspecified atrial fibrillation: Secondary | ICD-10-CM | POA: Diagnosis not present

## 2015-10-22 DIAGNOSIS — I5032 Chronic diastolic (congestive) heart failure: Secondary | ICD-10-CM | POA: Diagnosis not present

## 2015-10-24 DIAGNOSIS — E785 Hyperlipidemia, unspecified: Secondary | ICD-10-CM | POA: Diagnosis not present

## 2015-10-24 DIAGNOSIS — M810 Age-related osteoporosis without current pathological fracture: Secondary | ICD-10-CM | POA: Diagnosis not present

## 2015-10-24 DIAGNOSIS — M1990 Unspecified osteoarthritis, unspecified site: Secondary | ICD-10-CM | POA: Diagnosis not present

## 2015-10-24 DIAGNOSIS — I110 Hypertensive heart disease with heart failure: Secondary | ICD-10-CM | POA: Diagnosis not present

## 2015-10-24 DIAGNOSIS — R2681 Unsteadiness on feet: Secondary | ICD-10-CM | POA: Diagnosis not present

## 2015-10-24 DIAGNOSIS — M6281 Muscle weakness (generalized): Secondary | ICD-10-CM | POA: Diagnosis not present

## 2015-10-24 DIAGNOSIS — I5032 Chronic diastolic (congestive) heart failure: Secondary | ICD-10-CM | POA: Diagnosis not present

## 2015-10-24 DIAGNOSIS — R1311 Dysphagia, oral phase: Secondary | ICD-10-CM | POA: Diagnosis not present

## 2015-10-24 DIAGNOSIS — D509 Iron deficiency anemia, unspecified: Secondary | ICD-10-CM | POA: Diagnosis not present

## 2015-10-24 DIAGNOSIS — I69398 Other sequelae of cerebral infarction: Secondary | ICD-10-CM | POA: Diagnosis not present

## 2015-10-24 DIAGNOSIS — C3490 Malignant neoplasm of unspecified part of unspecified bronchus or lung: Secondary | ICD-10-CM | POA: Diagnosis not present

## 2015-10-24 DIAGNOSIS — E1159 Type 2 diabetes mellitus with other circulatory complications: Secondary | ICD-10-CM | POA: Diagnosis not present

## 2015-10-24 DIAGNOSIS — I4891 Unspecified atrial fibrillation: Secondary | ICD-10-CM | POA: Diagnosis not present

## 2015-10-25 DIAGNOSIS — R1311 Dysphagia, oral phase: Secondary | ICD-10-CM | POA: Diagnosis not present

## 2015-10-25 DIAGNOSIS — I110 Hypertensive heart disease with heart failure: Secondary | ICD-10-CM | POA: Diagnosis not present

## 2015-10-25 DIAGNOSIS — R2681 Unsteadiness on feet: Secondary | ICD-10-CM | POA: Diagnosis not present

## 2015-10-25 DIAGNOSIS — D509 Iron deficiency anemia, unspecified: Secondary | ICD-10-CM | POA: Diagnosis not present

## 2015-10-25 DIAGNOSIS — E119 Type 2 diabetes mellitus without complications: Secondary | ICD-10-CM | POA: Diagnosis not present

## 2015-10-25 DIAGNOSIS — E785 Hyperlipidemia, unspecified: Secondary | ICD-10-CM | POA: Diagnosis not present

## 2015-10-25 DIAGNOSIS — I10 Essential (primary) hypertension: Secondary | ICD-10-CM | POA: Diagnosis not present

## 2015-10-25 DIAGNOSIS — E1159 Type 2 diabetes mellitus with other circulatory complications: Secondary | ICD-10-CM | POA: Diagnosis not present

## 2015-10-25 DIAGNOSIS — M1990 Unspecified osteoarthritis, unspecified site: Secondary | ICD-10-CM | POA: Diagnosis not present

## 2015-10-25 DIAGNOSIS — I69398 Other sequelae of cerebral infarction: Secondary | ICD-10-CM | POA: Diagnosis not present

## 2015-10-25 DIAGNOSIS — C3490 Malignant neoplasm of unspecified part of unspecified bronchus or lung: Secondary | ICD-10-CM | POA: Diagnosis not present

## 2015-10-25 DIAGNOSIS — M810 Age-related osteoporosis without current pathological fracture: Secondary | ICD-10-CM | POA: Diagnosis not present

## 2015-10-25 DIAGNOSIS — E782 Mixed hyperlipidemia: Secondary | ICD-10-CM | POA: Diagnosis not present

## 2015-10-25 DIAGNOSIS — I4891 Unspecified atrial fibrillation: Secondary | ICD-10-CM | POA: Diagnosis not present

## 2015-10-25 DIAGNOSIS — I5032 Chronic diastolic (congestive) heart failure: Secondary | ICD-10-CM | POA: Diagnosis not present

## 2015-10-25 DIAGNOSIS — M6281 Muscle weakness (generalized): Secondary | ICD-10-CM | POA: Diagnosis not present

## 2015-10-25 DIAGNOSIS — E039 Hypothyroidism, unspecified: Secondary | ICD-10-CM | POA: Diagnosis not present

## 2015-10-28 DIAGNOSIS — I5032 Chronic diastolic (congestive) heart failure: Secondary | ICD-10-CM | POA: Diagnosis not present

## 2015-10-28 DIAGNOSIS — M6281 Muscle weakness (generalized): Secondary | ICD-10-CM | POA: Diagnosis not present

## 2015-10-28 DIAGNOSIS — I110 Hypertensive heart disease with heart failure: Secondary | ICD-10-CM | POA: Diagnosis not present

## 2015-10-28 DIAGNOSIS — I4891 Unspecified atrial fibrillation: Secondary | ICD-10-CM | POA: Diagnosis not present

## 2015-10-28 DIAGNOSIS — R2681 Unsteadiness on feet: Secondary | ICD-10-CM | POA: Diagnosis not present

## 2015-10-28 DIAGNOSIS — R1311 Dysphagia, oral phase: Secondary | ICD-10-CM | POA: Diagnosis not present

## 2015-10-28 DIAGNOSIS — C3490 Malignant neoplasm of unspecified part of unspecified bronchus or lung: Secondary | ICD-10-CM | POA: Diagnosis not present

## 2015-10-28 DIAGNOSIS — E785 Hyperlipidemia, unspecified: Secondary | ICD-10-CM | POA: Diagnosis not present

## 2015-10-28 DIAGNOSIS — M810 Age-related osteoporosis without current pathological fracture: Secondary | ICD-10-CM | POA: Diagnosis not present

## 2015-10-28 DIAGNOSIS — M1990 Unspecified osteoarthritis, unspecified site: Secondary | ICD-10-CM | POA: Diagnosis not present

## 2015-10-28 DIAGNOSIS — D509 Iron deficiency anemia, unspecified: Secondary | ICD-10-CM | POA: Diagnosis not present

## 2015-10-28 DIAGNOSIS — I69398 Other sequelae of cerebral infarction: Secondary | ICD-10-CM | POA: Diagnosis not present

## 2015-10-28 DIAGNOSIS — E1159 Type 2 diabetes mellitus with other circulatory complications: Secondary | ICD-10-CM | POA: Diagnosis not present

## 2015-10-29 DIAGNOSIS — I110 Hypertensive heart disease with heart failure: Secondary | ICD-10-CM | POA: Diagnosis not present

## 2015-10-29 DIAGNOSIS — D509 Iron deficiency anemia, unspecified: Secondary | ICD-10-CM | POA: Diagnosis not present

## 2015-10-29 DIAGNOSIS — E785 Hyperlipidemia, unspecified: Secondary | ICD-10-CM | POA: Diagnosis not present

## 2015-10-29 DIAGNOSIS — R1311 Dysphagia, oral phase: Secondary | ICD-10-CM | POA: Diagnosis not present

## 2015-10-29 DIAGNOSIS — I5032 Chronic diastolic (congestive) heart failure: Secondary | ICD-10-CM | POA: Diagnosis not present

## 2015-10-29 DIAGNOSIS — M1990 Unspecified osteoarthritis, unspecified site: Secondary | ICD-10-CM | POA: Diagnosis not present

## 2015-10-29 DIAGNOSIS — I69398 Other sequelae of cerebral infarction: Secondary | ICD-10-CM | POA: Diagnosis not present

## 2015-10-29 DIAGNOSIS — C3490 Malignant neoplasm of unspecified part of unspecified bronchus or lung: Secondary | ICD-10-CM | POA: Diagnosis not present

## 2015-10-29 DIAGNOSIS — M6281 Muscle weakness (generalized): Secondary | ICD-10-CM | POA: Diagnosis not present

## 2015-10-29 DIAGNOSIS — E1159 Type 2 diabetes mellitus with other circulatory complications: Secondary | ICD-10-CM | POA: Diagnosis not present

## 2015-10-29 DIAGNOSIS — I4891 Unspecified atrial fibrillation: Secondary | ICD-10-CM | POA: Diagnosis not present

## 2015-10-29 DIAGNOSIS — R2681 Unsteadiness on feet: Secondary | ICD-10-CM | POA: Diagnosis not present

## 2015-10-29 DIAGNOSIS — E119 Type 2 diabetes mellitus without complications: Secondary | ICD-10-CM | POA: Diagnosis not present

## 2015-10-29 DIAGNOSIS — M810 Age-related osteoporosis without current pathological fracture: Secondary | ICD-10-CM | POA: Diagnosis not present

## 2015-10-31 DIAGNOSIS — E1159 Type 2 diabetes mellitus with other circulatory complications: Secondary | ICD-10-CM | POA: Diagnosis not present

## 2015-10-31 DIAGNOSIS — R1311 Dysphagia, oral phase: Secondary | ICD-10-CM | POA: Diagnosis not present

## 2015-10-31 DIAGNOSIS — M810 Age-related osteoporosis without current pathological fracture: Secondary | ICD-10-CM | POA: Diagnosis not present

## 2015-10-31 DIAGNOSIS — I110 Hypertensive heart disease with heart failure: Secondary | ICD-10-CM | POA: Diagnosis not present

## 2015-10-31 DIAGNOSIS — M6281 Muscle weakness (generalized): Secondary | ICD-10-CM | POA: Diagnosis not present

## 2015-10-31 DIAGNOSIS — C3490 Malignant neoplasm of unspecified part of unspecified bronchus or lung: Secondary | ICD-10-CM | POA: Diagnosis not present

## 2015-10-31 DIAGNOSIS — I69398 Other sequelae of cerebral infarction: Secondary | ICD-10-CM | POA: Diagnosis not present

## 2015-10-31 DIAGNOSIS — M1990 Unspecified osteoarthritis, unspecified site: Secondary | ICD-10-CM | POA: Diagnosis not present

## 2015-10-31 DIAGNOSIS — I5032 Chronic diastolic (congestive) heart failure: Secondary | ICD-10-CM | POA: Diagnosis not present

## 2015-10-31 DIAGNOSIS — I4891 Unspecified atrial fibrillation: Secondary | ICD-10-CM | POA: Diagnosis not present

## 2015-10-31 DIAGNOSIS — R2681 Unsteadiness on feet: Secondary | ICD-10-CM | POA: Diagnosis not present

## 2015-10-31 DIAGNOSIS — E785 Hyperlipidemia, unspecified: Secondary | ICD-10-CM | POA: Diagnosis not present

## 2015-10-31 DIAGNOSIS — D509 Iron deficiency anemia, unspecified: Secondary | ICD-10-CM | POA: Diagnosis not present

## 2015-11-03 DIAGNOSIS — I63139 Cerebral infarction due to embolism of unspecified carotid artery: Secondary | ICD-10-CM | POA: Diagnosis not present

## 2015-11-03 DIAGNOSIS — E559 Vitamin D deficiency, unspecified: Secondary | ICD-10-CM | POA: Diagnosis not present

## 2015-11-05 DIAGNOSIS — J069 Acute upper respiratory infection, unspecified: Secondary | ICD-10-CM | POA: Diagnosis not present

## 2015-11-05 DIAGNOSIS — E1159 Type 2 diabetes mellitus with other circulatory complications: Secondary | ICD-10-CM | POA: Diagnosis not present

## 2015-11-05 DIAGNOSIS — R1311 Dysphagia, oral phase: Secondary | ICD-10-CM | POA: Diagnosis not present

## 2015-11-05 DIAGNOSIS — M1990 Unspecified osteoarthritis, unspecified site: Secondary | ICD-10-CM | POA: Diagnosis not present

## 2015-11-05 DIAGNOSIS — Z8673 Personal history of transient ischemic attack (TIA), and cerebral infarction without residual deficits: Secondary | ICD-10-CM | POA: Diagnosis not present

## 2015-11-05 DIAGNOSIS — I110 Hypertensive heart disease with heart failure: Secondary | ICD-10-CM | POA: Diagnosis not present

## 2015-11-05 DIAGNOSIS — E785 Hyperlipidemia, unspecified: Secondary | ICD-10-CM | POA: Diagnosis not present

## 2015-11-05 DIAGNOSIS — M6281 Muscle weakness (generalized): Secondary | ICD-10-CM | POA: Diagnosis not present

## 2015-11-05 DIAGNOSIS — D509 Iron deficiency anemia, unspecified: Secondary | ICD-10-CM | POA: Diagnosis not present

## 2015-11-05 DIAGNOSIS — I5032 Chronic diastolic (congestive) heart failure: Secondary | ICD-10-CM | POA: Diagnosis not present

## 2015-11-05 DIAGNOSIS — C3490 Malignant neoplasm of unspecified part of unspecified bronchus or lung: Secondary | ICD-10-CM | POA: Diagnosis not present

## 2015-11-05 DIAGNOSIS — R2681 Unsteadiness on feet: Secondary | ICD-10-CM | POA: Diagnosis not present

## 2015-11-05 DIAGNOSIS — I4891 Unspecified atrial fibrillation: Secondary | ICD-10-CM | POA: Diagnosis not present

## 2015-11-05 DIAGNOSIS — M810 Age-related osteoporosis without current pathological fracture: Secondary | ICD-10-CM | POA: Diagnosis not present

## 2015-11-05 DIAGNOSIS — I69398 Other sequelae of cerebral infarction: Secondary | ICD-10-CM | POA: Diagnosis not present

## 2015-11-05 DIAGNOSIS — E119 Type 2 diabetes mellitus without complications: Secondary | ICD-10-CM | POA: Diagnosis not present

## 2015-11-07 DIAGNOSIS — R110 Nausea: Secondary | ICD-10-CM | POA: Diagnosis not present

## 2015-11-07 DIAGNOSIS — K529 Noninfective gastroenteritis and colitis, unspecified: Secondary | ICD-10-CM | POA: Diagnosis not present

## 2015-11-07 DIAGNOSIS — J90 Pleural effusion, not elsewhere classified: Secondary | ICD-10-CM | POA: Diagnosis not present

## 2015-11-07 DIAGNOSIS — I081 Rheumatic disorders of both mitral and tricuspid valves: Secondary | ICD-10-CM | POA: Diagnosis not present

## 2015-11-07 DIAGNOSIS — R05 Cough: Secondary | ICD-10-CM | POA: Diagnosis not present

## 2015-11-07 DIAGNOSIS — I4891 Unspecified atrial fibrillation: Secondary | ICD-10-CM | POA: Diagnosis not present

## 2015-11-07 DIAGNOSIS — Z923 Personal history of irradiation: Secondary | ICD-10-CM | POA: Diagnosis not present

## 2015-11-07 DIAGNOSIS — J181 Lobar pneumonia, unspecified organism: Secondary | ICD-10-CM | POA: Diagnosis not present

## 2015-11-07 DIAGNOSIS — Z8673 Personal history of transient ischemic attack (TIA), and cerebral infarction without residual deficits: Secondary | ICD-10-CM | POA: Diagnosis not present

## 2015-11-07 DIAGNOSIS — R0600 Dyspnea, unspecified: Secondary | ICD-10-CM | POA: Diagnosis not present

## 2015-11-07 DIAGNOSIS — J9620 Acute and chronic respiratory failure, unspecified whether with hypoxia or hypercapnia: Secondary | ICD-10-CM | POA: Diagnosis not present

## 2015-11-07 DIAGNOSIS — N281 Cyst of kidney, acquired: Secondary | ICD-10-CM | POA: Diagnosis not present

## 2015-11-07 DIAGNOSIS — Z7189 Other specified counseling: Secondary | ICD-10-CM | POA: Diagnosis not present

## 2015-11-07 DIAGNOSIS — R000 Tachycardia, unspecified: Secondary | ICD-10-CM | POA: Diagnosis not present

## 2015-11-07 DIAGNOSIS — I5032 Chronic diastolic (congestive) heart failure: Secondary | ICD-10-CM | POA: Diagnosis not present

## 2015-11-07 DIAGNOSIS — E119 Type 2 diabetes mellitus without complications: Secondary | ICD-10-CM | POA: Diagnosis not present

## 2015-11-07 DIAGNOSIS — J9621 Acute and chronic respiratory failure with hypoxia: Secondary | ICD-10-CM | POA: Diagnosis not present

## 2015-11-07 DIAGNOSIS — I5033 Acute on chronic diastolic (congestive) heart failure: Secondary | ICD-10-CM | POA: Diagnosis not present

## 2015-11-07 DIAGNOSIS — J939 Pneumothorax, unspecified: Secondary | ICD-10-CM | POA: Diagnosis not present

## 2015-11-07 DIAGNOSIS — T17890A Other foreign object in other parts of respiratory tract causing asphyxiation, initial encounter: Secondary | ICD-10-CM | POA: Diagnosis not present

## 2015-11-07 DIAGNOSIS — J9381 Chronic pneumothorax: Secondary | ICD-10-CM | POA: Diagnosis not present

## 2015-11-07 DIAGNOSIS — J9600 Acute respiratory failure, unspecified whether with hypoxia or hypercapnia: Secondary | ICD-10-CM | POA: Diagnosis not present

## 2015-11-07 DIAGNOSIS — I272 Other secondary pulmonary hypertension: Secondary | ICD-10-CM | POA: Diagnosis not present

## 2015-11-07 DIAGNOSIS — Z885 Allergy status to narcotic agent status: Secondary | ICD-10-CM | POA: Diagnosis not present

## 2015-11-07 DIAGNOSIS — J811 Chronic pulmonary edema: Secondary | ICD-10-CM | POA: Diagnosis not present

## 2015-11-07 DIAGNOSIS — I071 Rheumatic tricuspid insufficiency: Secondary | ICD-10-CM | POA: Diagnosis not present

## 2015-11-07 DIAGNOSIS — E785 Hyperlipidemia, unspecified: Secondary | ICD-10-CM | POA: Diagnosis not present

## 2015-11-07 DIAGNOSIS — Z7901 Long term (current) use of anticoagulants: Secondary | ICD-10-CM | POA: Diagnosis not present

## 2015-11-07 DIAGNOSIS — R918 Other nonspecific abnormal finding of lung field: Secondary | ICD-10-CM | POA: Diagnosis not present

## 2015-11-07 DIAGNOSIS — C3490 Malignant neoplasm of unspecified part of unspecified bronchus or lung: Secondary | ICD-10-CM | POA: Diagnosis not present

## 2015-11-07 DIAGNOSIS — I482 Chronic atrial fibrillation: Secondary | ICD-10-CM | POA: Diagnosis not present

## 2015-11-07 DIAGNOSIS — Z66 Do not resuscitate: Secondary | ICD-10-CM | POA: Diagnosis not present

## 2015-11-07 DIAGNOSIS — Z4682 Encounter for fitting and adjustment of non-vascular catheter: Secondary | ICD-10-CM | POA: Diagnosis not present

## 2015-11-07 DIAGNOSIS — J9811 Atelectasis: Secondary | ICD-10-CM | POA: Diagnosis not present

## 2015-11-07 DIAGNOSIS — R079 Chest pain, unspecified: Secondary | ICD-10-CM | POA: Diagnosis not present

## 2015-11-07 DIAGNOSIS — I63511 Cerebral infarction due to unspecified occlusion or stenosis of right middle cerebral artery: Secondary | ICD-10-CM | POA: Diagnosis not present

## 2015-11-07 DIAGNOSIS — M810 Age-related osteoporosis without current pathological fracture: Secondary | ICD-10-CM | POA: Diagnosis not present

## 2015-11-07 DIAGNOSIS — J9383 Other pneumothorax: Secondary | ICD-10-CM | POA: Diagnosis not present

## 2015-11-07 DIAGNOSIS — J156 Pneumonia due to other aerobic Gram-negative bacteria: Secondary | ICD-10-CM | POA: Diagnosis not present

## 2015-11-07 DIAGNOSIS — I517 Cardiomegaly: Secondary | ICD-10-CM | POA: Diagnosis not present

## 2015-11-07 DIAGNOSIS — Z85118 Personal history of other malignant neoplasm of bronchus and lung: Secondary | ICD-10-CM | POA: Diagnosis not present

## 2015-11-07 DIAGNOSIS — D509 Iron deficiency anemia, unspecified: Secondary | ICD-10-CM | POA: Diagnosis not present

## 2015-11-07 DIAGNOSIS — I480 Paroxysmal atrial fibrillation: Secondary | ICD-10-CM | POA: Diagnosis not present

## 2015-11-07 DIAGNOSIS — Z515 Encounter for palliative care: Secondary | ICD-10-CM | POA: Diagnosis not present

## 2015-11-07 DIAGNOSIS — E860 Dehydration: Secondary | ICD-10-CM | POA: Diagnosis not present

## 2015-12-04 DIAGNOSIS — E559 Vitamin D deficiency, unspecified: Secondary | ICD-10-CM | POA: Diagnosis not present

## 2015-12-04 DIAGNOSIS — I63139 Cerebral infarction due to embolism of unspecified carotid artery: Secondary | ICD-10-CM | POA: Diagnosis not present

## 2015-12-06 DIAGNOSIS — D509 Iron deficiency anemia, unspecified: Secondary | ICD-10-CM | POA: Diagnosis not present

## 2015-12-06 DIAGNOSIS — I4891 Unspecified atrial fibrillation: Secondary | ICD-10-CM | POA: Diagnosis not present

## 2015-12-06 DIAGNOSIS — E785 Hyperlipidemia, unspecified: Secondary | ICD-10-CM | POA: Diagnosis not present

## 2015-12-06 DIAGNOSIS — E119 Type 2 diabetes mellitus without complications: Secondary | ICD-10-CM | POA: Diagnosis not present

## 2015-12-06 DIAGNOSIS — C3490 Malignant neoplasm of unspecified part of unspecified bronchus or lung: Secondary | ICD-10-CM | POA: Diagnosis not present

## 2015-12-09 DEATH — deceased

## 2016-01-24 ENCOUNTER — Telehealth: Payer: Self-pay

## 2016-01-24 NOTE — Telephone Encounter (Signed)
[name] called back---patient is deceased---death occurred Dec 10, 2015

## 2016-01-24 NOTE — Telephone Encounter (Signed)
Left message for daughter/melissa----would patient be interested in restarting prolia injections----she used to get them here, can talk with tamara if any questions---insurance will need to be reverified

## 2016-03-25 ENCOUNTER — Ambulatory Visit: Payer: Medicare Other | Admitting: Neurology

## 2016-04-02 IMAGING — CT NM PET TUM IMG RESTAG (PS) SKULL BASE T - THIGH
7 series · 25 of 25 positions shown · non-contrast
Comparison: PET-CT 10/18/2012 and 05/01/2013.  Chest CT 11/30/2014.

CLINICAL DATA: Subsequent treatment strategy for right lung cancer
post radiation therapy.

EXAM:
NUCLEAR MEDICINE PET SKULL BASE TO THIGH
TECHNIQUE: 4.96 mCi F-18 FDG was injected intravenously. Full-ring PET imaging
was performed from the skull base to thigh after the radiotracer. CT
data was obtained and used for attenuation correction and anatomic
localization.
FASTING BLOOD GLUCOSE:  Value: 261 mg/dl

[Series 3: pet sk_thigh ac · axial · 5.0mm · 4.07mm/px · z∈[-744,-56]mm · 6 of 173 slices shown]
[im 1/173]
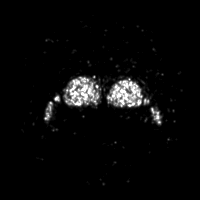
[im 35/173]
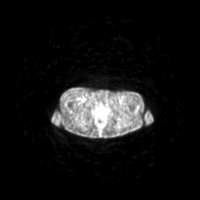
[im 69/173]
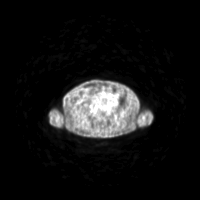
[im 104/173]
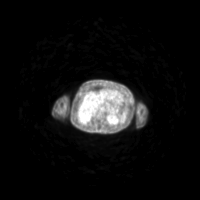
[im 138/173]
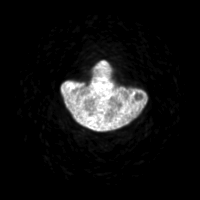
[im 173/173]
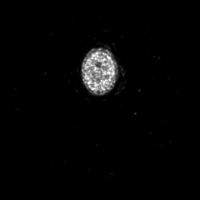

[Series 4: ct sk_thigh 5.0 b31f · axial · 5.0mm · 0.98mm/px · z∈[-744,-56]mm · 5 of 173 slices shown]
[im 1/173]
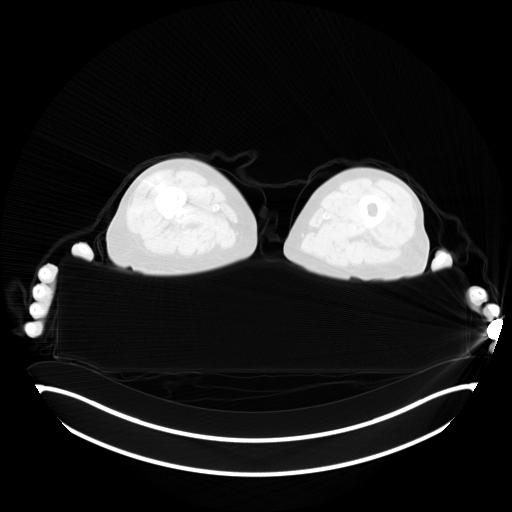
[im 44/173]
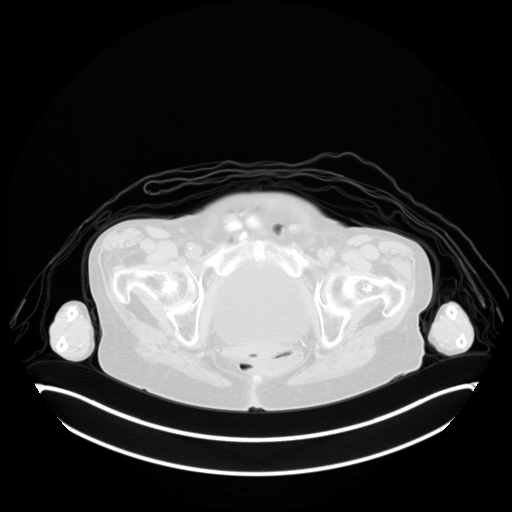
[im 87/173]
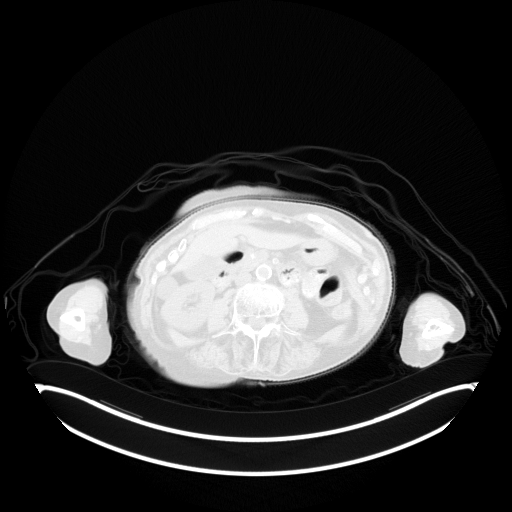
[im 130/173]
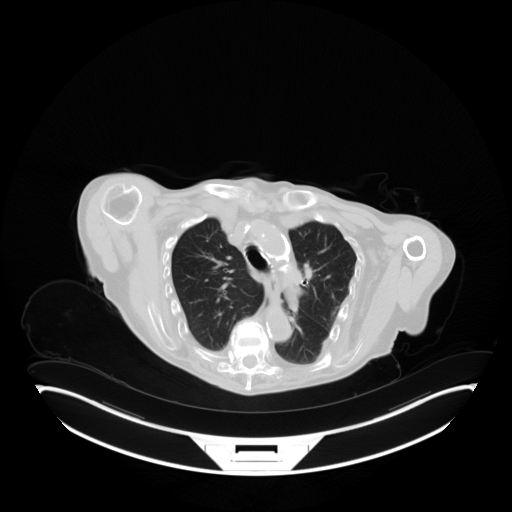
[im 173/173  brain]
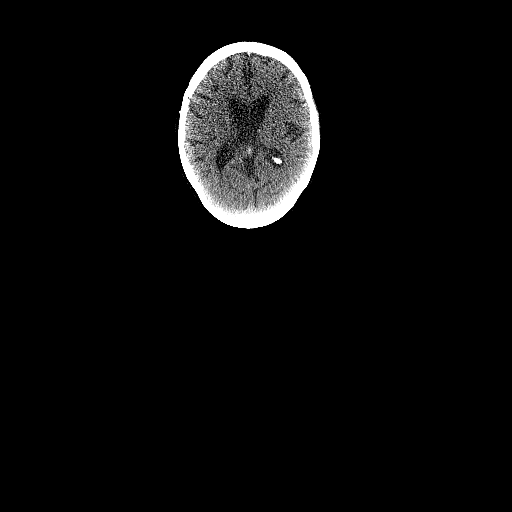

[Series 6: ct sk_thigh 5.0 b70f (id)_bone · axial · 5.0mm · 0.43mm/px · 1 of 48 slices shown]
[im 1/48  bone]
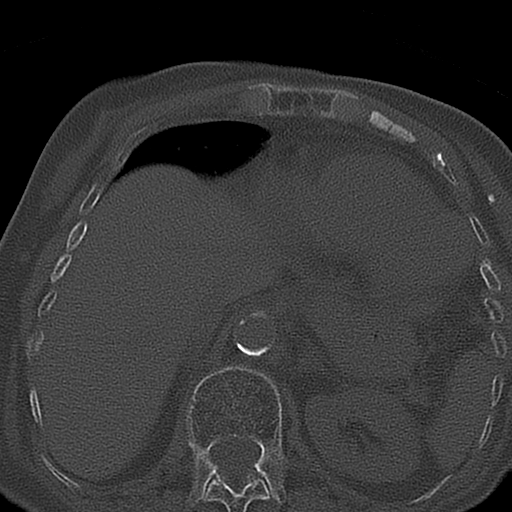

[Series 8: pet sk_thigh nac · axial · 5.0mm · 4.07mm/px · z∈[-744,-56]mm · 5 of 173 slices shown]
[im 1/173]
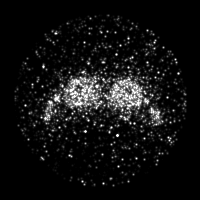
[im 44/173]
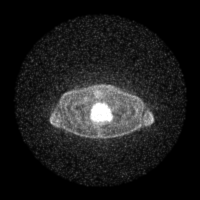
[im 87/173]
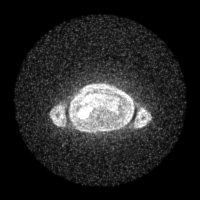
[im 130/173]
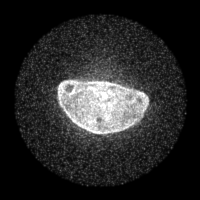
[im 173/173]
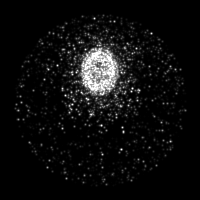

[Series 604: mip collection<mip range> · coronal · 1.68mm/px · 1 of 32 slices shown]
[im 1/32]
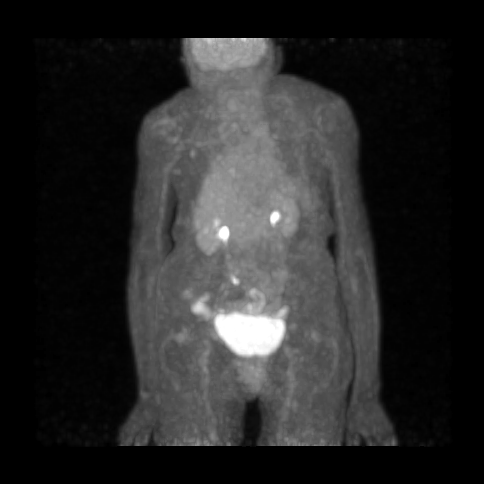

[Series 605: range-ct sk_thigh 5.0 (id)<alpha range> · 3 of 87 slices shown (1 of 2)]
[im 1/87]
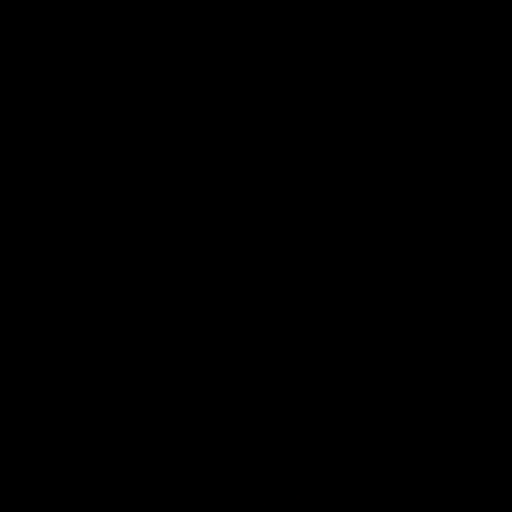
[im 44/87]
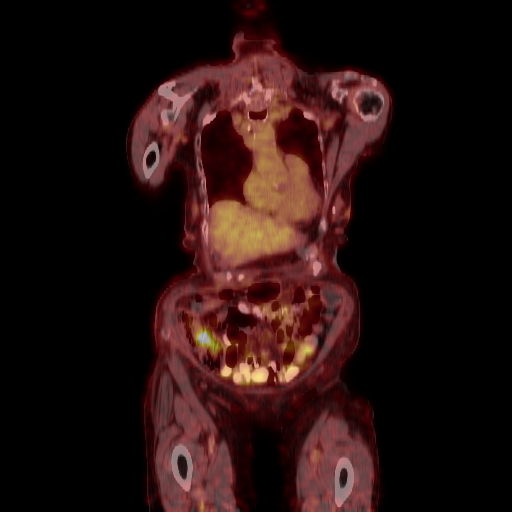
[im 87/87]
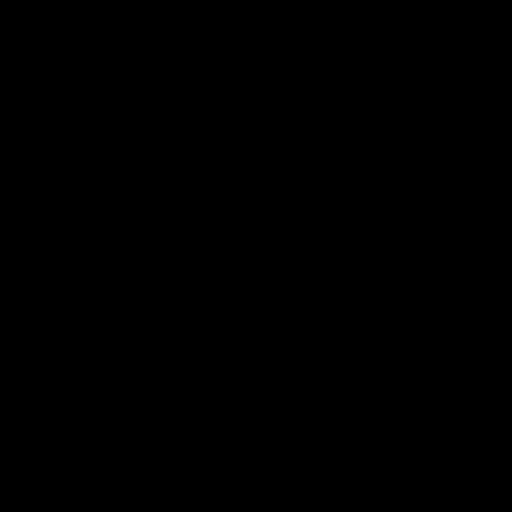

[Series 606: range-ct sk_thigh 5.0 (id)<alpha range> · 4 of 132 slices shown (2 of 2)]
[im 1/132]
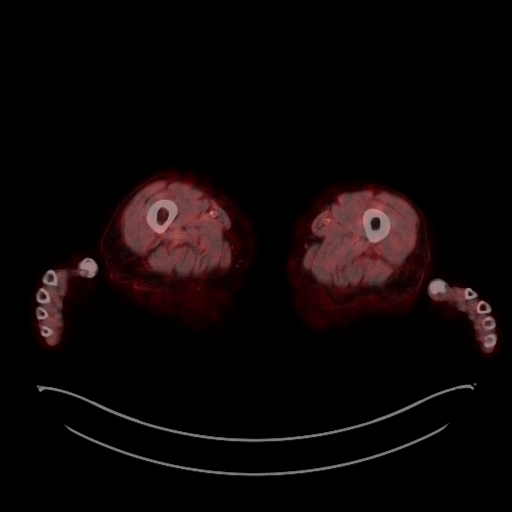
[im 44/132]
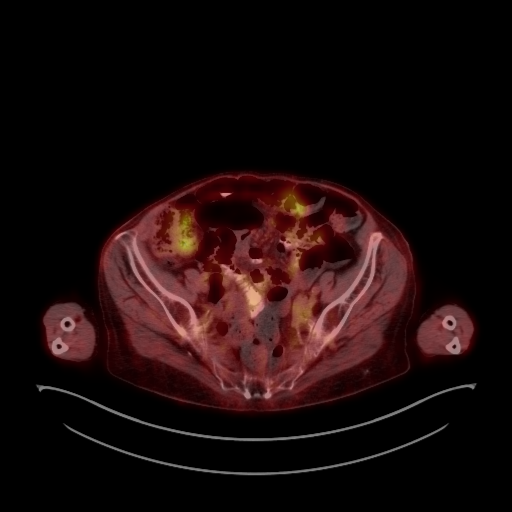
[im 88/132]
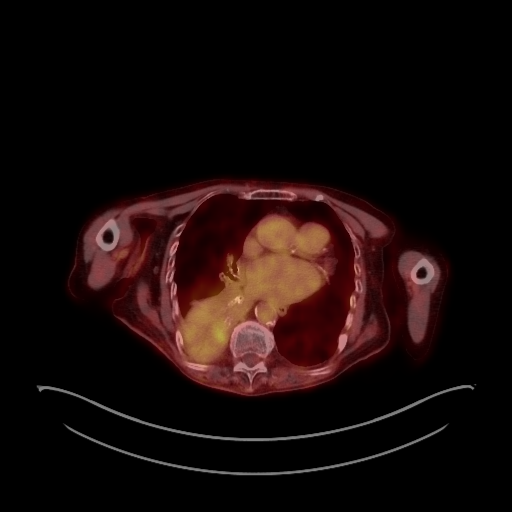
[im 132/132]
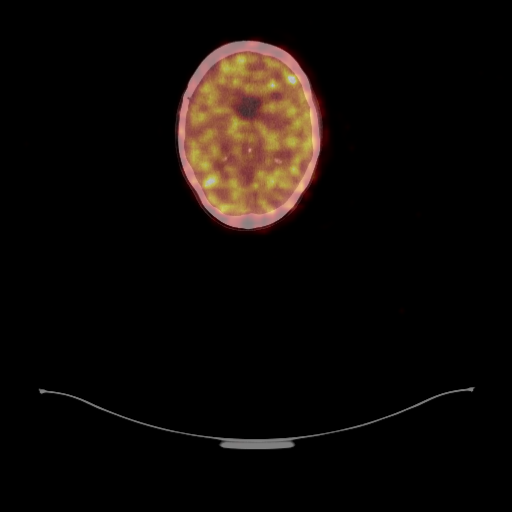

[25 of 25 positions shown; findings below may reference images not displayed]

FINDINGS: NECK

No hypermetabolic cervical lymph nodes are identified.There are no
lesions of the pharyngeal mucosal space. Diffuse carotid
calcifications noted. There is stable mild nodularity within the
right thyroid lobe without abnormal metabolic activity.

CHEST

There are no hypermetabolic mediastinal, hilar or axillary lymph
nodes. Again demonstrated is complete right lower lobe collapse.
There is extensive calcification within the tracheobronchial tree.
There is no abnormal metabolic activity within the collapsed lobe or
right perihilar region. The aerated lungs demonstrate no suspicious
nodules. Mild emphysematous changes are noted.

ABDOMEN/PELVIS

There is no hypermetabolic activity within the liver, adrenal
glands, spleen or pancreas. There is no hypermetabolic nodal
activity. Right renal cyst and moderate diffuse atherosclerosis
noted.

SKELETON

There is no hypermetabolic activity to suggest osseous metastatic
disease. Again noted is a severe thoracic kyphosis secondary to
multiple compression deformities. Spinal augmentation has been
performed at several levels.
IMPRESSION: 1. There is no abnormal metabolic activity associated with collapsed
right lower lobe to suggest recurrent tumor. Therefore, this likely
represents chronic fibrosis.
2. No evidence of metastatic disease.
3. Diffuse atherosclerosis.
4. Severe thoracic kyphosis secondary to multiple compression
deformities.

## 2016-08-22 IMAGING — CR DG CHEST 1V
1 series · 1 of 1 positions shown · non-contrast
Comparison: CT chest 05/14/2015

CLINICAL DATA: Weakness, altered mental status

EXAM:
CHEST 1 VIEW

[chest ap]
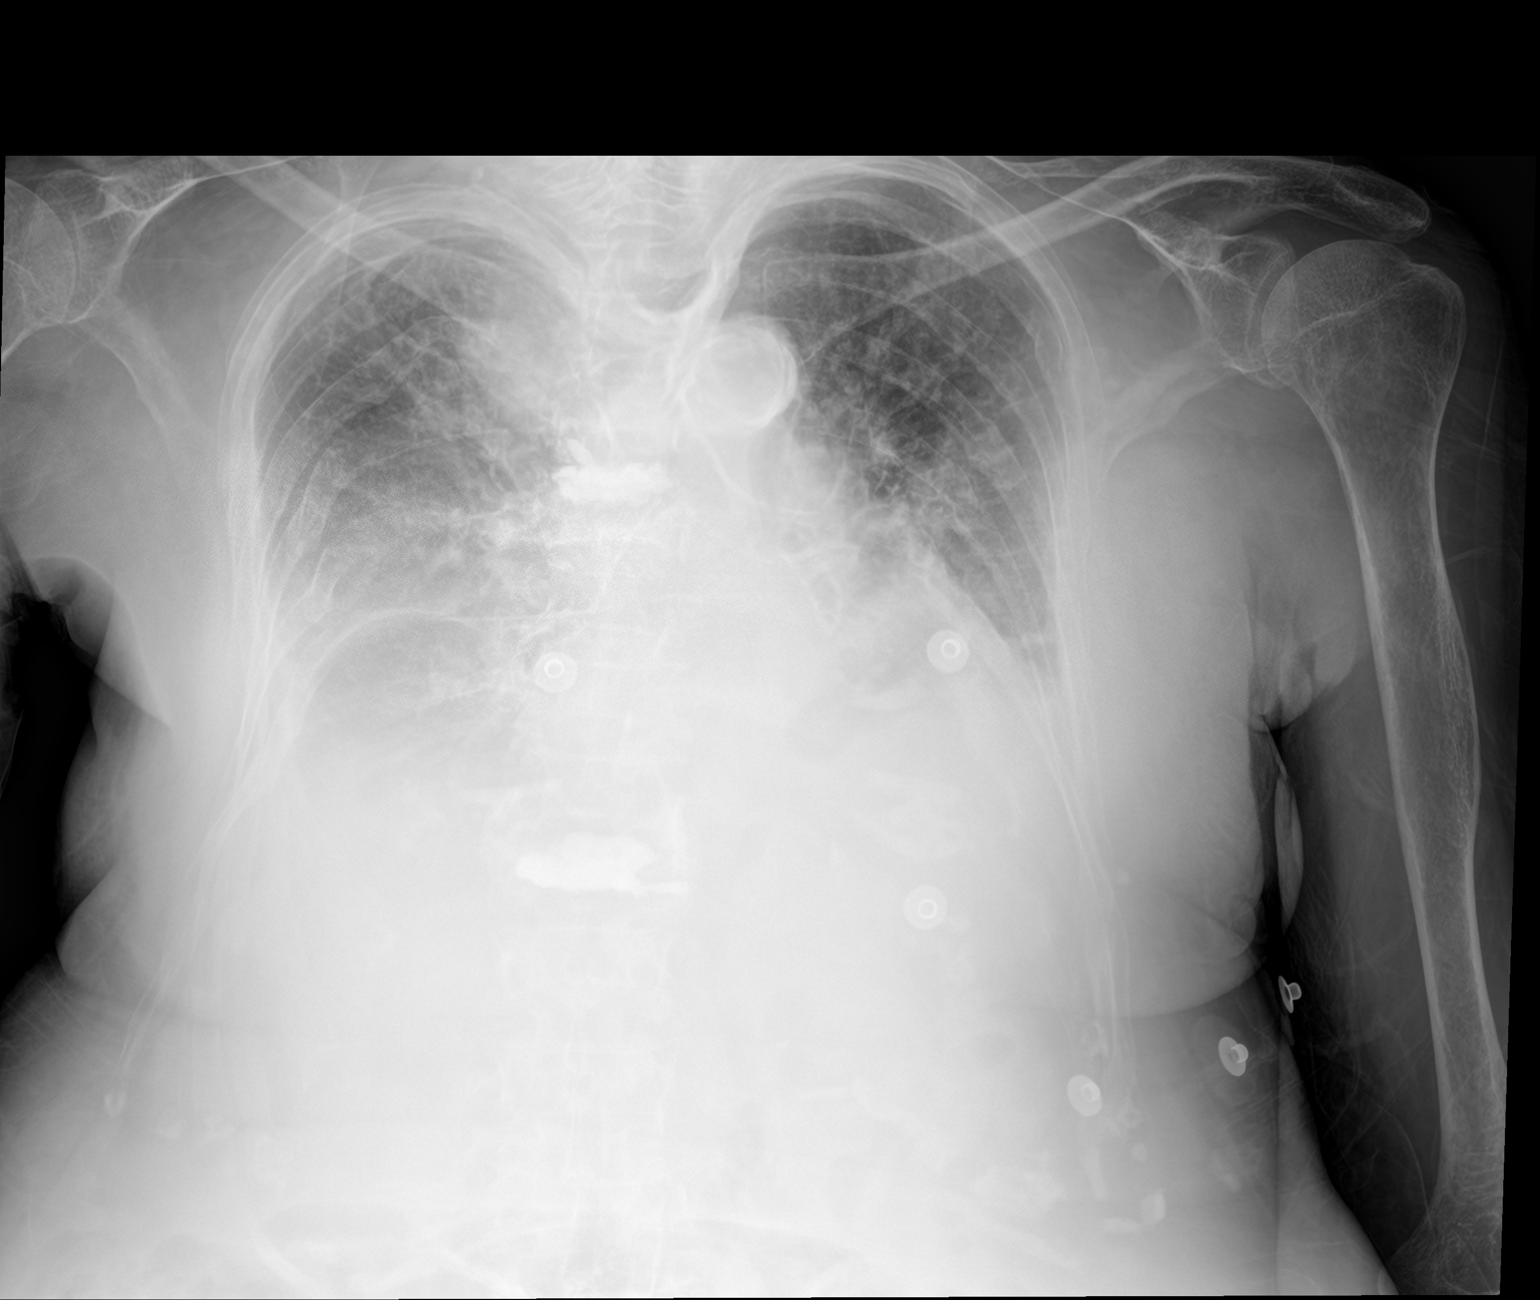

[1 of 1 positions shown; findings below may reference images not displayed]

FINDINGS: Bilateral small pleural effusions, right greater than left. Right
basilar airspace disease similar to the prior exam. There is
bilateral interstitial thickening. There is no pneumothorax.

The heart and mediastinum are stable. There is thoracic aortic
atherosclerosis.

There is no acute osseous abnormality. There are old right posterior
rib fractures.
IMPRESSION: Bilateral pleural effusions and interstitial as well as alveolar
airspace opacities concerning for an element of pulmonary edema
versus infection. The right lower lobe airspace disease is similar
to the prior CT chest dated 05/14/2015.

## 2016-08-23 IMAGING — CR DG CHEST 1V PORT
1 series · 1 of 1 positions shown · non-contrast
Comparison: 05/25/2015

CLINICAL DATA: Short of breath. Also complaining of back pain and
pelvic pain. Subsequent encounter.

EXAM:
PORTABLE CHEST 1 VIEW

[AP]
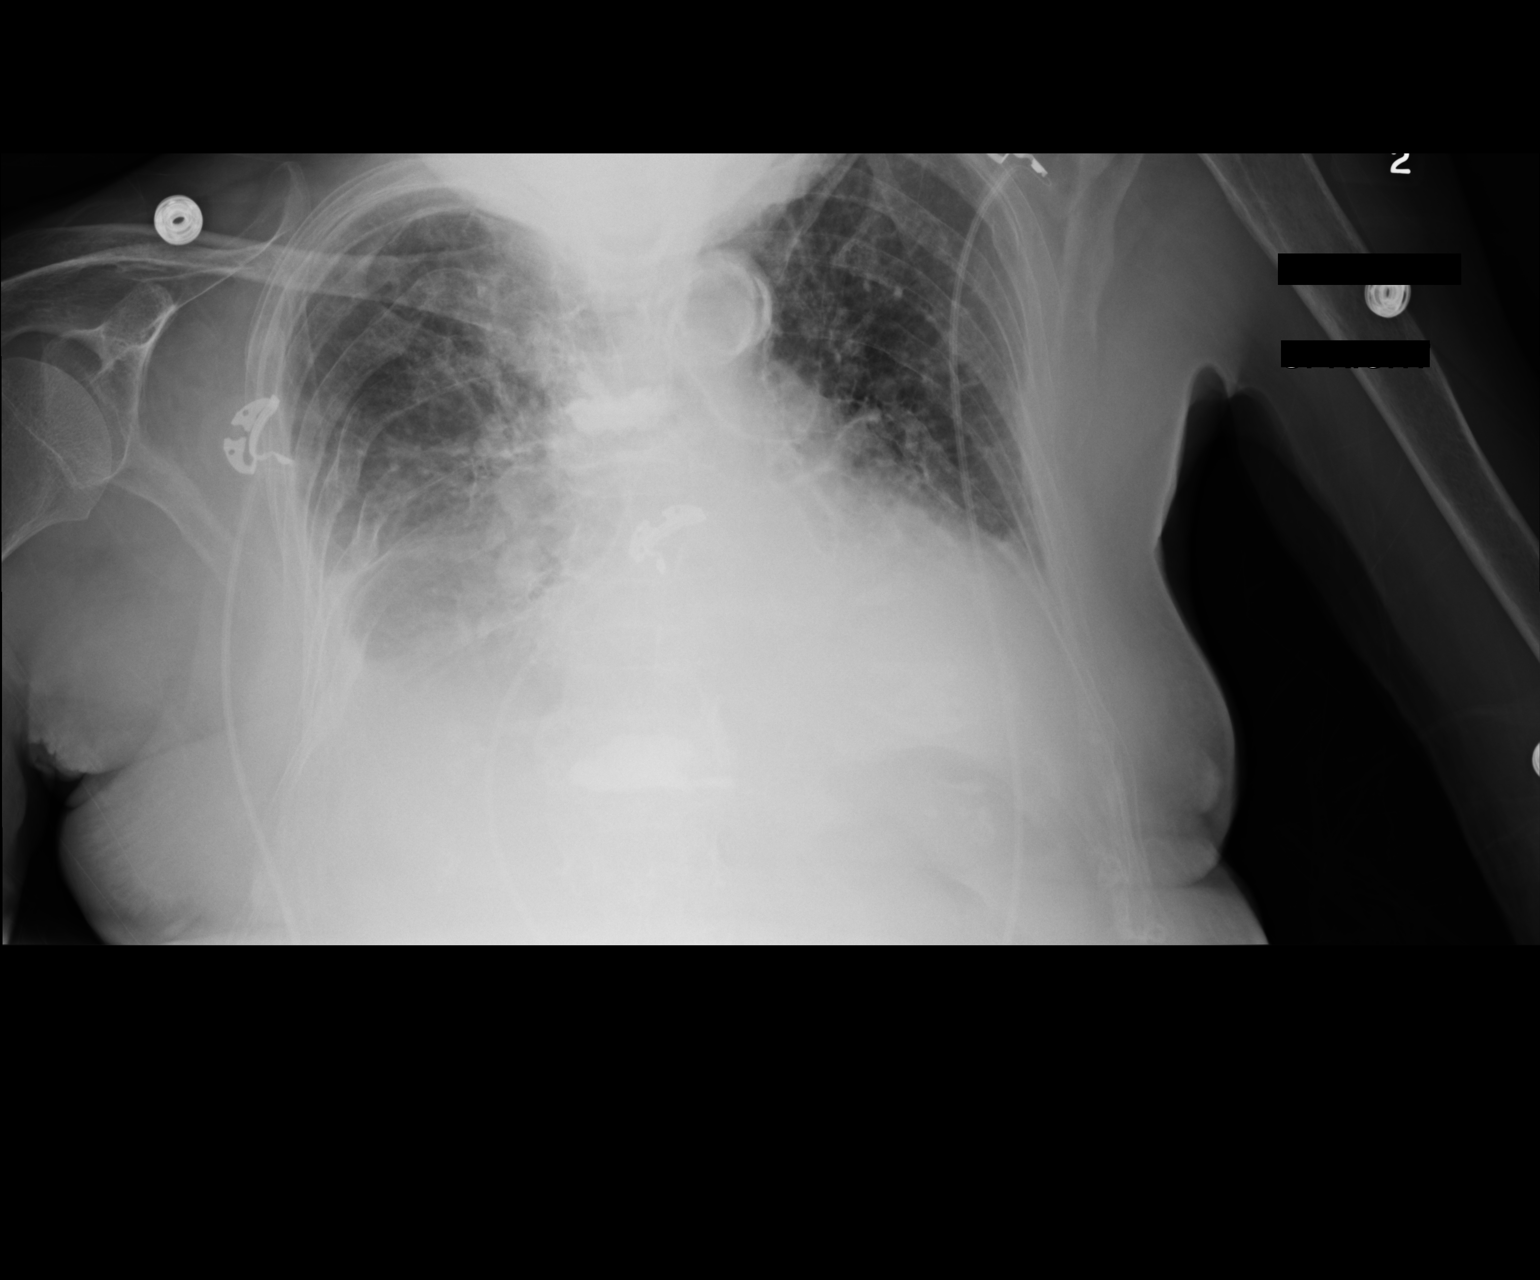

[1 of 1 positions shown; findings below may reference images not displayed]

FINDINGS: Bilateral pleural effusions are without change from the prior study.
Interstitial thickening and mild hazy central airspace opacity noted
previously has mildly improved. There are no new areas of lung
opacity.

Cardiac silhouette is mildly enlarged.

No pneumothorax.
IMPRESSION: 1. Mild improvement in presumed pulmonary edema. There are
persistent pleural effusions obscuring the hemidiaphragms. No new
lung abnormalities.

## 2016-08-30 ENCOUNTER — Encounter: Payer: Self-pay | Admitting: Student
